# Patient Record
Sex: Male | Born: 1945 | Race: White | Hispanic: No | Smoking: Former smoker
Health system: Southern US, Community
[De-identification: ages and names within clinical notes are randomized; demographics above are authoritative.]

## PROBLEM LIST (undated history)

## (undated) DIAGNOSIS — E785 Hyperlipidemia, unspecified: Secondary | ICD-10-CM

## (undated) DIAGNOSIS — Z789 Other specified health status: Secondary | ICD-10-CM

## (undated) DIAGNOSIS — I2510 Atherosclerotic heart disease of native coronary artery without angina pectoris: Secondary | ICD-10-CM

## (undated) DIAGNOSIS — J439 Emphysema, unspecified: Secondary | ICD-10-CM

## (undated) DIAGNOSIS — J449 Chronic obstructive pulmonary disease, unspecified: Secondary | ICD-10-CM

## (undated) DIAGNOSIS — J939 Pneumothorax, unspecified: Secondary | ICD-10-CM

## (undated) DIAGNOSIS — C61 Malignant neoplasm of prostate: Secondary | ICD-10-CM

## (undated) DIAGNOSIS — I255 Ischemic cardiomyopathy: Secondary | ICD-10-CM

## (undated) DIAGNOSIS — R911 Solitary pulmonary nodule: Secondary | ICD-10-CM

## (undated) HISTORY — DX: Malignant neoplasm of prostate: C61

## (undated) HISTORY — DX: Emphysema, unspecified: J43.9

## (undated) HISTORY — PX: NASAL POLYP SURGERY: SHX186

## (undated) HISTORY — DX: Pneumothorax, unspecified: J93.9

## (undated) HISTORY — DX: Atherosclerotic heart disease of native coronary artery without angina pectoris: I25.10

## (undated) HISTORY — PX: ABDOMINAL HYSTERECTOMY: SHX81

## (undated) HISTORY — DX: Chronic obstructive pulmonary disease, unspecified: J44.9

## (undated) HISTORY — PX: CATARACT EXTRACTION, BILATERAL: SHX1313

## (undated) HISTORY — DX: Solitary pulmonary nodule: R91.1

## (undated) HISTORY — DX: Hyperlipidemia, unspecified: E78.5

## (undated) HISTORY — DX: Ischemic cardiomyopathy: I25.5

## (undated) SURGERY — Surgical Case
Anesthesia: *Unknown

---

## 2017-02-13 ENCOUNTER — Emergency Department: Payer: Medicare Other

## 2017-02-13 ENCOUNTER — Inpatient Hospital Stay
Admission: EM | Admit: 2017-02-13 | Discharge: 2017-02-14 | DRG: 282 | Disposition: A | Discharge status: Other Acute Inpatient Hospital | Payer: Medicare Other | Attending: Internal Medicine | Admitting: Internal Medicine

## 2017-02-13 ENCOUNTER — Encounter: Payer: Self-pay | Admitting: Emergency Medicine

## 2017-02-13 DIAGNOSIS — I2510 Atherosclerotic heart disease of native coronary artery without angina pectoris: Secondary | ICD-10-CM | POA: Diagnosis present

## 2017-02-13 DIAGNOSIS — I214 Non-ST elevation (NSTEMI) myocardial infarction: Principal | ICD-10-CM | POA: Diagnosis present

## 2017-02-13 DIAGNOSIS — Z9841 Cataract extraction status, right eye: Secondary | ICD-10-CM | POA: Diagnosis not present

## 2017-02-13 DIAGNOSIS — Z87891 Personal history of nicotine dependence: Secondary | ICD-10-CM | POA: Diagnosis not present

## 2017-02-13 DIAGNOSIS — Z9842 Cataract extraction status, left eye: Secondary | ICD-10-CM

## 2017-02-13 DIAGNOSIS — I959 Hypotension, unspecified: Secondary | ICD-10-CM | POA: Diagnosis present

## 2017-02-13 DIAGNOSIS — R079 Chest pain, unspecified: Secondary | ICD-10-CM | POA: Diagnosis not present

## 2017-02-13 HISTORY — DX: Other specified health status: Z78.9

## 2017-02-13 LAB — CBC
HEMATOCRIT: 44.3 % (ref 40.0–52.0)
Hemoglobin: 15.2 g/dL (ref 13.0–18.0)
MCH: 31 pg (ref 26.0–34.0)
MCHC: 34.2 g/dL (ref 32.0–36.0)
MCV: 90.6 fL (ref 80.0–100.0)
PLATELETS: 222 10*3/uL (ref 150–440)
RBC: 4.89 MIL/uL (ref 4.40–5.90)
RDW: 13.8 % (ref 11.5–14.5)
WBC: 10.6 10*3/uL (ref 3.8–10.6)

## 2017-02-13 LAB — BASIC METABOLIC PANEL
Anion gap: 10 (ref 5–15)
BUN: 19 mg/dL (ref 6–20)
CHLORIDE: 103 mmol/L (ref 101–111)
CO2: 26 mmol/L (ref 22–32)
Calcium: 9.2 mg/dL (ref 8.9–10.3)
Creatinine, Ser: 1 mg/dL (ref 0.61–1.24)
GFR calc non Af Amer: >60 mL/min (ref >60)
Glucose, Bld: 107 mg/dL — ABNORMAL HIGH (ref 65–99)
POTASSIUM: 3.7 mmol/L (ref 3.5–5.1)
SODIUM: 139 mmol/L (ref 135–145)

## 2017-02-13 LAB — TROPONIN I: Troponin I: >65 ng/mL (ref <0.03)

## 2017-02-13 LAB — PROTIME-INR
INR: 1.11
PROTHROMBIN TIME: 14.3 s (ref 11.4–15.2)

## 2017-02-13 LAB — APTT: aPTT: 30 seconds (ref 24–36)

## 2017-02-13 LAB — BRAIN NATRIURETIC PEPTIDE: B NATRIURETIC PEPTIDE 5: 349 pg/mL — AB (ref 0.0–100.0)

## 2017-02-13 MED ORDER — SODIUM CHLORIDE 0.9 % IV BOLUS (SEPSIS)
1000.0000 mL | Freq: Once | INTRAVENOUS | Status: AC
  Administered 2017-02-13: 1000 mL via INTRAVENOUS

## 2017-02-13 MED ORDER — ASPIRIN 81 MG PO CHEW
324.0000 mg | CHEWABLE_TABLET | Freq: Once | ORAL | Status: AC
  Administered 2017-02-13: 324 mg via ORAL

## 2017-02-13 MED ORDER — ASPIRIN 81 MG PO CHEW
CHEWABLE_TABLET | ORAL | Status: AC
  Administered 2017-02-13: 324 mg via ORAL
  Filled 2017-02-13: qty 4

## 2017-02-13 MED ORDER — NITROGLYCERIN 2 % TD OINT
1.0000 [in_us] | TOPICAL_OINTMENT | Freq: Once | TRANSDERMAL | Status: AC
  Administered 2017-02-13: 1 [in_us] via TOPICAL

## 2017-02-13 MED ORDER — FENTANYL CITRATE (PF) 100 MCG/2ML IJ SOLN
50.0000 ug | Freq: Once | INTRAMUSCULAR | Status: AC
  Administered 2017-02-13: 50 ug via INTRAVENOUS
  Filled 2017-02-13: qty 2

## 2017-02-13 MED ORDER — NITROGLYCERIN 2 % TD OINT
TOPICAL_OINTMENT | TRANSDERMAL | Status: AC
  Administered 2017-02-13: 1 [in_us] via TOPICAL
  Filled 2017-02-13: qty 1

## 2017-02-13 MED ORDER — NITROGLYCERIN IN D5W 200-5 MCG/ML-% IV SOLN
0.0000 ug/min | INTRAVENOUS | Status: DC
  Filled 2017-02-13: qty 250

## 2017-02-13 NOTE — H&P (Signed)
[facility] [place] NAME: Thomas Trujillo    MR#:  [id number]  DATE OF BIRTH:  04-Nov-1945  DATE OF ADMISSION:  02/13/2017  PRIMARY CARE PHYSICIAN: No primary care provider on file.   REQUESTING/REFERRING PHYSICIAN: Burlene Arnt, MD  CHIEF COMPLAINT:   Chief Complaint  Patient presents with  . Chest Pain    HISTORY OF PRESENT ILLNESS:  Thomas Trujillo  is a 71 y.o. male who presents with Chest pain. Patient states he had an acute episode of onset of chest pain associated with diaphoresis and shortness of breath about 24 hours ago. He has had persistent chest pain since that time to a lesser degree. He came in tonight to be evaluated and his initial troponin was greater than 65. Cardiology saw the patient in the ED, and does he is otherwise stable elected to proceed with cardiac catheterization in the morning. Hospitalists were called for admission.  Of note, patient has no other medical conditions on file, states he has not seen a physician for the past 20-30 years.  PAST MEDICAL HISTORY:   Past Medical History:  Diagnosis Date  . Patient denies medical problems     PAST SURGICAL HISTORY:   Past Surgical History:  Procedure Laterality Date  . ABDOMINAL HYSTERECTOMY    . CATARACT EXTRACTION, BILATERAL    . NASAL POLYP SURGERY      SOCIAL HISTORY:   Social History  Substance Use Topics  . Smoking status: Former Research scientist (life sciences)  . Smokeless tobacco: Never Used  . Alcohol use No    FAMILY HISTORY:   Family History  Problem Relation Age of Onset  . Obesity Son     DRUG ALLERGIES:  No Known Allergies  MEDICATIONS AT HOME:   Prior to Admission medications   Not on File    REVIEW OF SYSTEMS:  Review of Systems  Constitutional: Negative for chills, fever, malaise/fatigue and weight loss.  HENT: Negative for ear pain, hearing loss and tinnitus.   Eyes: Negative for blurred vision, double vision, pain and redness.   Respiratory: Negative for cough, hemoptysis and shortness of breath.   Cardiovascular: Positive for chest pain. Negative for palpitations, orthopnea and leg swelling.  Gastrointestinal: Negative for abdominal pain, constipation, diarrhea, nausea and vomiting.  Genitourinary: Negative for dysuria, frequency and hematuria.  Musculoskeletal: Negative for back pain, joint pain and neck pain.  Skin:       No acne, rash, or lesions  Neurological: Negative for dizziness, tremors, focal weakness and weakness.  Endo/Heme/Allergies: Negative for polydipsia. Does not bruise/bleed easily.  Psychiatric/Behavioral: Negative for depression. The patient is not nervous/anxious and does not have insomnia.      VITAL SIGNS:   Vitals:   02/13/17 2209 02/13/17 2214  BP:  128/66  Pulse:  87  Resp:  20  Temp:  98.3 F (36.8 C)  TempSrc:  Oral  SpO2:  97%  Weight: 63.5 kg (140 lb)   Height: 5\' 5"  (1.651 m)    Wt Readings from Last 3 Encounters:  02/13/17 63.5 kg (140 lb)    PHYSICAL EXAMINATION:  Physical Exam  Vitals reviewed. Constitutional: He is oriented to person, place, and time. He appears well-developed and well-nourished. No distress.  HENT:  Head: Normocephalic and atraumatic.  Mouth/Throat: Oropharynx is clear and moist.  Eyes: Conjunctivae and EOM are normal. Pupils are equal, round, and reactive to light. No scleral icterus.  Neck: Normal range of motion. Neck supple. No JVD present. No  thyromegaly present.  Cardiovascular: Normal rate, regular rhythm and intact distal pulses.  Exam reveals no gallop and no friction rub.   No murmur heard. Respiratory: Effort normal and breath sounds normal. No respiratory distress. He has no wheezes. He has no rales.  GI: Soft. Bowel sounds are normal. He exhibits no distension. There is no tenderness.  Musculoskeletal: Normal range of motion. He exhibits no edema.  No arthritis, no gout  Lymphadenopathy:    He has no cervical adenopathy.   Neurological: He is alert and oriented to person, place, and time. No cranial nerve deficit.  No dysarthria, no aphasia  Skin: Skin is warm and dry. No rash noted. No erythema.  Psychiatric: He has a normal mood and affect. His behavior is normal. Judgment and thought content normal.    LABORATORY PANEL:   CBC  Recent Labs Lab 02/13/17 2212  WBC 10.6  HGB 15.2  HCT 44.3  PLT 222   ------------------------------------------------------------------------------------------------------------------  Chemistries   Recent Labs Lab 02/13/17 2212  NA 139  K 3.7  CL 103  CO2 26  GLUCOSE 107*  BUN 19  CREATININE 1.00  CALCIUM 9.2   ------------------------------------------------------------------------------------------------------------------  Cardiac Enzymes  Recent Labs Lab 02/13/17 2212  TROPONINI >65.00*   ------------------------------------------------------------------------------------------------------------------  RADIOLOGY:  Dg Chest Port 1 View  Result Date: 02/13/2017 CLINICAL DATA:  71-year-old male could chest pain. EXAM: PORTABLE CHEST 1 VIEW COMPARISON:  None. FINDINGS: There is emphysematous changes of the lungs with areas of bullous appearing in the upper lobes. There is hyperexpansion of the lungs with bilateral flattening of the diaphragms. Bilateral mid to lower lung field linear densities most consistent with atelectasis/scarring. There is no focal consolidation, pleural effusion, or pneumothorax. The cardiac silhouette is within normal limits. There is atherosclerotic calcification of the aortic arch. No acute osseous pathology. IMPRESSION: 1. No acute cardiopulmonary process. 2. Emphysema. Electronically Signed   By: Anner Crete M.D.   On: 02/13/2017 23:01    EKG:   Orders placed or performed during the hospital encounter of 02/13/17  . ED EKG within 10 minutes  . ED EKG within 10 minutes  . EKG 12-Lead  . EKG 12-Lead    IMPRESSION AND  PLAN:  Principal Problem:   NSTEMI (non-ST elevated myocardial infarction) (HCC) - heparin drip started, chest pain has resolved at this time. Cardiology consulted and will likely proceed with catheterization in the morning. Echocardiogram ordered. We will continue to trend his cardiac enzymes.  All the records are reviewed and case discussed with ED provider. Management plans discussed with the patient and/or family.  DVT PROPHYLAXIS: Systemic anticoagulation  GI PROPHYLAXIS: None  ADMISSION STATUS: Inpatient  CODE STATUS: Full Code Status History    This patient does not have a recorded code status. Please follow your organizational policy for patients in this situation.      TOTAL TIME TAKING CARE OF THIS PATIENT: 45 minutes.   Tracey Stewart Worton 02/13/2017, 11:28 PM  [facility]  Office  [phone]  CC: Primary care physician; No primary care provider on file.  Note:  This document was prepared using Dragon voice recognition software and may include unintentional dictation errors.

## 2017-02-13 NOTE — ED Provider Notes (Addendum)
[facility] Emergency Department Provider Note  ____________________________________________   I have reviewed the triage vital signs and the nursing notes.   HISTORY  Chief Complaint Chest Pain    HPI Thomas Trujillo is a 71 y.o. male who states "I don't go to doctors very much" denies any significant past medical history, states that he does not drink, quit smoking 10 years ago. Retired here from [employer] a few years ago. States that over the last several weeks he had exertional dyspnea. When he walks, he gets so winded he has to stop what he is doing and then last IV and have off-and-on nonradiating gradual onset chest discomfort which she describes as a pressure. Seems a might be worse when he walks around although sometimes it happens at rest. He has not had this before. His been going on for 24 hours. It's a 2 or 3 of 10 at this time. Has been more significant in the past day. Aside from possibly exerting himself nothing makes it better and nothing makes it worse. He denies any cough or fever. He is not having shortness of breath at this time. Did not eat much because he had decreased appetite energy today, he states he did have some diaphoresis. Pain was not tearing, not maximum at intensity in onset, it was gradual, it is not pleuritic, no personal or family history of PE or DVT, no leg swelling.     No past medical history on file.  There are no active problems to display for this patient.   No past surgical history on file.  Prior to Admission medications   Not on File    Allergies Patient has no allergy information on record.  No family history on file.  Social History Social History  Substance Use Topics  . Smoking status: Not on file  . Smokeless tobacco: Not on file  . Alcohol use Not on file    Review of Systems Constitutional: No fever/chills Eyes: No visual changes. ENT: No sore throat. No stiff neck no neck  pain Cardiovascular: Positive chest pain. Respiratory: Positive shortness of breath. Gastrointestinal:   no vomiting.  No diarrhea.  No constipation. Genitourinary: Negative for dysuria. Musculoskeletal: Negative lower extremity swelling Skin: Negative for rash. Neurological: Negative for severe headaches, focal weakness or numbness. 10-point ROS otherwise negative.  ____________________________________________   PHYSICAL EXAM:  VITAL SIGNS: ED Triage Vitals  Enc Vitals Group     BP 02/13/17 2214 128/66     Pulse Rate 02/13/17 2214 87     Resp 02/13/17 2214 20     Temp 02/13/17 2214 98.3 F (36.8 C)     Temp Source 02/13/17 2214 Oral     SpO2 02/13/17 2214 97 %     Weight 02/13/17 2209 140 lb (63.5 kg)     Height 02/13/17 2209 5\' 5"  (1.651 m)     Head Circumference --      Peak Flow --      Pain Score 02/13/17 2208 3     Pain Loc --      Pain Edu? --      Excl. in [place]? --     Constitutional: Alert and oriented. Well appearing and in no acute distress. Eyes: Conjunctivae are normal. PERRL. EOMI. Head: Atraumatic. Nose: No congestion/rhinnorhea. Mouth/Throat: Mucous membranes are moist.  Oropharynx non-erythematous. Neck: No stridor.   Nontender with no meningismus Cardiovascular: Normal rate, regular rhythm. Grossly normal heart sounds.  Good peripheral circulation. Respiratory:  Normal respiratory effort.  No retractions. Lungs CTAB. Abdominal: Soft and nontender. No distention. No guarding no rebound Back:  There is no focal tenderness or step off.  there is no midline tenderness there are no lesions noted. there is no CVA tenderness Musculoskeletal: No lower extremity tenderness, no upper extremity tenderness. No joint effusions, no DVT signs strong distal pulses no edema Neurologic:  Normal speech and language. No gross focal neurologic deficits are appreciated.  Skin:  Skin is warm, dry and intact. No rash noted. Psychiatric: Mood and affect are normal. Speech and  behavior are normal.  ____________________________________________   LABS (all labs ordered are listed, but only abnormal results are displayed)  Labs Reviewed  BASIC METABOLIC PANEL  CBC  TROPONIN I  PROTIME-INR   ____________________________________________  EKG  I personally interpreted any EKGs ordered by me or triage Normal sinus rhythm rate 91 bpm, no acute ST elevation, possible old anterior infarct. Normal axis nonspecific ST changes ____________________________________________  RADIOLOGY  I reviewed any imaging ordered by me or triage that were performed during my shift and, if possible, patient and/or family made aware of any abnormal findings. ____________________________________________   PROCEDURES  Procedure(s) performed: None  Procedures  Critical Care performed: CRITICAL CARE Performed by: Schuyler Amor   Total critical care time: 55 minutes  Critical care time was exclusive of separately billable procedures and treating other patients.  Critical care was necessary to treat or prevent imminent or life-threatening deterioration.  Critical care was time spent personally by me on the following activities: development of treatment plan with patient and/or surrogate as well as nursing, discussions with consultants, evaluation of patient's response to treatment, examination of patient, obtaining history from patient or surrogate, ordering and performing treatments and interventions, ordering and review of laboratory studies, ordering and review of radiographic studies, pulse oximetry and re-evaluation of patient's condition.   ____________________________________________   INITIAL IMPRESSION / ASSESSMENT AND PLAN / ED COURSE  Pertinent labs & imaging results that were available during my care of the patient were reviewed by me and considered in my medical decision making (see chart for details).  Administration with a very concerning story for  possible CAD. At this time, he has minimal discomfort EKG does not meet STEMI requirements. We will give him aspirin, nitroglycerin, vital signs are reassuring. Exam is reassuring. However, patient will need to be admitted I think for this symptom which is concerning with a week or so of ongoing exertional dyspnea With chest pain over the last 24 hours. Low suspicion for PE or dissection. Administration has no pleuritic pain, no leg swelling, and he has no real risk factors for PE. Nor does his pain seemed consistent with dissection. Abdomen is benign. No evidence of referred abdominal discomfort at this time.  ----------------------------------------- 10:36 PM on 02/13/2017 -----------------------------------------  Pain is improved after nitroglycerin, down to 2 out of 10, remains otherwise asymptomatic. Awaiting results of blood work.  ----------------------------------------- 11:21 PM on 02/13/2017 -----------------------------------------  Troponin I noted to be over 65, in his very minimal at this time, we'll give him fentanyl to see if that gets the pain under control, he is at a 1 or 2 out of 10. Likely had cardiac ischemia over the last few days. EKG again does not show any evidence of STEMI. We'll discuss with cardiology, patient is been admitted to the hospitalist  ----------------------------------------- 11:27 PM on 02/13/2017 -----------------------------------------  D/w dr. Clayborn Bigness, he agrees with heparin drip which I have ordered, step down  admission, low-dose beta blockers if  pressure can handle during the admission but not emergently, the patient has ongoing chest pain after fentanyl we will start him on a nitro drip. We are watching his pressure. Again pain trivial at this time. However, considering that he has pain at all given this troponin. EKG reviewed by myself and cardiology, they do not feel patient needs to go to Cath Lab. Patient kept abreast of these findings, Dr.  Jannifer Franklin is admitting.  ----------------------------------------- 11:53 PM on 02/13/2017 -----------------------------------------  Pain free holding ntg gtt, seen by dr Fletcher Anon who does not feel that the pt needs emergent cath    ____________________________________________   FINAL CLINICAL IMPRESSION(S) / ED DIAGNOSES  Final diagnoses:  Chest pain      This chart was dictated using voice recognition software.  Despite best efforts to proofread,  errors can occur which can change meaning.      Schuyler Amor, MD 02/13/17 2222    Schuyler Amor, MD 02/13/17 [id number]    Schuyler Amor, MD 02/13/17 9767    Schuyler Amor, MD 02/13/17 [place], MD 02/13/17 [phone]

## 2017-02-13 NOTE — ED Triage Notes (Addendum)
Pt arrived via ems from home. EMS reports pt's chest pain started last night and became worse when he laid down flat; he took some otc cough medication and felt better until this afternoon when he laid back down. Pt states the pain starts in his left chest and goes across to the right chest and feels like pressure. Upon assessment pt alert and oriented. Pt reports to MD that when walking from car to inside house while carrying groceries he has become very winded and has to sit down "and catch my breath." Pt states he hasn't been to a doctor in an estimated 10 years.

## 2017-02-14 ENCOUNTER — Inpatient Hospital Stay (HOSPITAL_COMMUNITY): Payer: Medicare Other | Admitting: Certified Registered"

## 2017-02-14 ENCOUNTER — Inpatient Hospital Stay (HOSPITAL_COMMUNITY): Payer: Medicare Other

## 2017-02-14 ENCOUNTER — Encounter
Admission: EM | Disposition: A | Discharge status: Other Acute Inpatient Hospital | Payer: Self-pay | Source: Home / Self Care | Attending: Internal Medicine

## 2017-02-14 ENCOUNTER — Encounter (HOSPITAL_COMMUNITY)
Admission: AD | Disposition: A | Discharge status: Skilled Nursing Facility | Payer: Self-pay | Source: Other Acute Inpatient Hospital | Attending: Cardiothoracic Surgery

## 2017-02-14 ENCOUNTER — Encounter (HOSPITAL_COMMUNITY): Payer: Self-pay | Admitting: Certified Registered"

## 2017-02-14 ENCOUNTER — Inpatient Hospital Stay (HOSPITAL_COMMUNITY)
Admission: AD | Admit: 2017-02-14 | Discharge: 2017-02-24 | DRG: 236 | Disposition: A | Discharge status: Skilled Nursing Facility | Payer: Medicare Other | Source: Other Acute Inpatient Hospital | Attending: Cardiothoracic Surgery | Admitting: Cardiothoracic Surgery

## 2017-02-14 ENCOUNTER — Encounter: Payer: Self-pay | Admitting: Internal Medicine

## 2017-02-14 ENCOUNTER — Other Ambulatory Visit: Payer: Self-pay

## 2017-02-14 DIAGNOSIS — Z7982 Long term (current) use of aspirin: Secondary | ICD-10-CM | POA: Diagnosis not present

## 2017-02-14 DIAGNOSIS — Z9841 Cataract extraction status, right eye: Secondary | ICD-10-CM | POA: Diagnosis not present

## 2017-02-14 DIAGNOSIS — Z9842 Cataract extraction status, left eye: Secondary | ICD-10-CM

## 2017-02-14 DIAGNOSIS — Z09 Encounter for follow-up examination after completed treatment for conditions other than malignant neoplasm: Secondary | ICD-10-CM

## 2017-02-14 DIAGNOSIS — J9811 Atelectasis: Secondary | ICD-10-CM | POA: Diagnosis present

## 2017-02-14 DIAGNOSIS — I214 Non-ST elevation (NSTEMI) myocardial infarction: Principal | ICD-10-CM

## 2017-02-14 DIAGNOSIS — R110 Nausea: Secondary | ICD-10-CM | POA: Diagnosis not present

## 2017-02-14 DIAGNOSIS — D62 Acute posthemorrhagic anemia: Secondary | ICD-10-CM | POA: Diagnosis not present

## 2017-02-14 DIAGNOSIS — Z951 Presence of aortocoronary bypass graft: Secondary | ICD-10-CM | POA: Diagnosis not present

## 2017-02-14 DIAGNOSIS — I2510 Atherosclerotic heart disease of native coronary artery without angina pectoris: Secondary | ICD-10-CM | POA: Diagnosis present

## 2017-02-14 DIAGNOSIS — J439 Emphysema, unspecified: Secondary | ICD-10-CM | POA: Diagnosis present

## 2017-02-14 DIAGNOSIS — Z79899 Other long term (current) drug therapy: Secondary | ICD-10-CM

## 2017-02-14 DIAGNOSIS — I2102 ST elevation (STEMI) myocardial infarction involving left anterior descending coronary artery: Secondary | ICD-10-CM | POA: Diagnosis present

## 2017-02-14 DIAGNOSIS — R339 Retention of urine, unspecified: Secondary | ICD-10-CM | POA: Diagnosis not present

## 2017-02-14 DIAGNOSIS — R0600 Dyspnea, unspecified: Secondary | ICD-10-CM

## 2017-02-14 DIAGNOSIS — I959 Hypotension, unspecified: Secondary | ICD-10-CM | POA: Diagnosis not present

## 2017-02-14 DIAGNOSIS — J849 Interstitial pulmonary disease, unspecified: Secondary | ICD-10-CM | POA: Diagnosis present

## 2017-02-14 DIAGNOSIS — Z87891 Personal history of nicotine dependence: Secondary | ICD-10-CM

## 2017-02-14 DIAGNOSIS — E8770 Fluid overload, unspecified: Secondary | ICD-10-CM | POA: Diagnosis not present

## 2017-02-14 DIAGNOSIS — I25110 Atherosclerotic heart disease of native coronary artery with unstable angina pectoris: Secondary | ICD-10-CM

## 2017-02-14 DIAGNOSIS — J90 Pleural effusion, not elsewhere classified: Secondary | ICD-10-CM | POA: Diagnosis present

## 2017-02-14 DIAGNOSIS — R079 Chest pain, unspecified: Secondary | ICD-10-CM | POA: Diagnosis present

## 2017-02-14 HISTORY — PX: LEFT HEART CATH AND CORONARY ANGIOGRAPHY: CATH118249

## 2017-02-14 HISTORY — PX: TEE WITHOUT CARDIOVERSION: SHX5443

## 2017-02-14 HISTORY — PX: ENDOVEIN HARVEST OF GREATER SAPHENOUS VEIN: SHX5059

## 2017-02-14 HISTORY — PX: STAPLING OF BLEBS: SHX6429

## 2017-02-14 HISTORY — DX: Atherosclerotic heart disease of native coronary artery without angina pectoris: I25.10

## 2017-02-14 HISTORY — PX: CORONARY ARTERY BYPASS GRAFT: SHX141

## 2017-02-14 LAB — POCT I-STAT 4, (NA,K, GLUC, HGB,HCT)
Glucose, Bld: 111 mg/dL — ABNORMAL HIGH (ref 65–99)
HCT: 27 % — ABNORMAL LOW (ref 39.0–52.0)
Hemoglobin: 9.2 g/dL — ABNORMAL LOW (ref 13.0–17.0)
Potassium: 3.7 mmol/L (ref 3.5–5.1)
Sodium: 139 mmol/L (ref 135–145)

## 2017-02-14 LAB — POCT I-STAT 3, ART BLOOD GAS (G3+)
Acid-Base Excess: 2 mmol/L (ref 0.0–2.0)
Acid-base deficit: 3 mmol/L — ABNORMAL HIGH (ref 0.0–2.0)
Bicarbonate: 26.8 mmol/L (ref 20.0–28.0)
Bicarbonate: 22.4 mmol/L (ref 20.0–28.0)
O2 Saturation: 100 %
O2 Saturation: 99 %
Patient temperature: 35.9
TCO2: 28 mmol/L (ref 0–100)
TCO2: 24 mmol/L (ref 0–100)
pCO2 arterial: 39.7 mmHg (ref 32.0–48.0)
pCO2 arterial: 38.2 mmHg (ref 32.0–48.0)
pH, Arterial: 7.438 (ref 7.350–7.450)
pH, Arterial: 7.372 (ref 7.350–7.450)
pO2, Arterial: 423 mmHg — ABNORMAL HIGH (ref 83.0–108.0)
pO2, Arterial: 116 mmHg — ABNORMAL HIGH (ref 83.0–108.0)

## 2017-02-14 LAB — LIPID PANEL
CHOL/HDL RATIO: 2.9 ratio
Cholesterol: 114 mg/dL (ref 0–200)
HDL: 39 mg/dL — ABNORMAL LOW (ref >40)
LDL Cholesterol: 65 mg/dL (ref 0–99)
Triglycerides: 51 mg/dL (ref <150)
VLDL: 10 mg/dL (ref 0–40)

## 2017-02-14 LAB — HEPARIN LEVEL (UNFRACTIONATED): HEPARIN UNFRACTIONATED: 0.32 [IU]/mL (ref 0.30–0.70)

## 2017-02-14 LAB — POCT I-STAT, CHEM 8
BUN: 18 mg/dL (ref 6–20)
BUN: 13 mg/dL (ref 6–20)
BUN: 17 mg/dL (ref 6–20)
BUN: 14 mg/dL (ref 6–20)
Calcium, Ion: 1.18 mmol/L (ref 1.15–1.40)
Calcium, Ion: 0.87 mmol/L — CL (ref 1.15–1.40)
Calcium, Ion: 1.17 mmol/L (ref 1.15–1.40)
Calcium, Ion: 1.14 mmol/L — ABNORMAL LOW (ref 1.15–1.40)
Chloride: 103 mmol/L (ref 101–111)
Chloride: 104 mmol/L (ref 101–111)
Chloride: 98 mmol/L — ABNORMAL LOW (ref 101–111)
Chloride: 102 mmol/L (ref 101–111)
Creatinine, Ser: 0.8 mg/dL (ref 0.61–1.24)
Creatinine, Ser: 0.4 mg/dL — ABNORMAL LOW (ref 0.61–1.24)
Creatinine, Ser: 0.7 mg/dL (ref 0.61–1.24)
Creatinine, Ser: 0.7 mg/dL (ref 0.61–1.24)
Glucose, Bld: 91 mg/dL (ref 65–99)
Glucose, Bld: 80 mg/dL (ref 65–99)
Glucose, Bld: 97 mg/dL (ref 65–99)
Glucose, Bld: 102 mg/dL — ABNORMAL HIGH (ref 65–99)
HCT: 32 % — ABNORMAL LOW (ref 39.0–52.0)
HCT: 25 % — ABNORMAL LOW (ref 39.0–52.0)
HCT: 31 % — ABNORMAL LOW (ref 39.0–52.0)
HCT: 24 % — ABNORMAL LOW (ref 39.0–52.0)
Hemoglobin: 10.9 g/dL — ABNORMAL LOW (ref 13.0–17.0)
Hemoglobin: 10.5 g/dL — ABNORMAL LOW (ref 13.0–17.0)
Hemoglobin: 8.5 g/dL — ABNORMAL LOW (ref 13.0–17.0)
Hemoglobin: 8.2 g/dL — ABNORMAL LOW (ref 13.0–17.0)
Potassium: 4.3 mmol/L (ref 3.5–5.1)
Potassium: 3.9 mmol/L (ref 3.5–5.1)
Potassium: 3.9 mmol/L (ref 3.5–5.1)
Potassium: 4.1 mmol/L (ref 3.5–5.1)
Sodium: 137 mmol/L (ref 135–145)
Sodium: 138 mmol/L (ref 135–145)
Sodium: 139 mmol/L (ref 135–145)
Sodium: 137 mmol/L (ref 135–145)
TCO2: 30 mmol/L (ref 0–100)
TCO2: 28 mmol/L (ref 0–100)
TCO2: 29 mmol/L (ref 0–100)
TCO2: 27 mmol/L (ref 0–100)

## 2017-02-14 LAB — CBC
HCT: 28.1 % — ABNORMAL LOW (ref 39.0–52.0)
HEMATOCRIT: 38.8 % — AB (ref 40.0–52.0)
HEMOGLOBIN: 13.4 g/dL (ref 13.0–18.0)
HEMOGLOBIN: 9.6 g/dL — AB (ref 13.0–17.0)
MCH: 31.1 pg (ref 26.0–34.0)
MCH: 31.4 pg (ref 26.0–34.0)
MCHC: 34.4 g/dL (ref 32.0–36.0)
MCHC: 34.2 g/dL (ref 30.0–36.0)
MCV: 90.4 fL (ref 80.0–100.0)
MCV: 91.8 fL (ref 78.0–100.0)
Platelets: 198 10*3/uL (ref 150–440)
Platelets: 117 10*3/uL — ABNORMAL LOW (ref 150–400)
RBC: 4.3 MIL/uL — ABNORMAL LOW (ref 4.40–5.90)
RBC: 3.06 MIL/uL — AB (ref 4.22–5.81)
RDW: 13.6 % (ref 11.5–14.5)
RDW: 13.7 % (ref 11.5–15.5)
WBC: 7.6 10*3/uL (ref 3.8–10.6)
WBC: 10.8 10*3/uL — ABNORMAL HIGH (ref 4.0–10.5)

## 2017-02-14 LAB — TROPONIN I: Troponin I: 50.17 ng/mL (ref <0.03)

## 2017-02-14 LAB — MRSA PCR SCREENING: MRSA by PCR: NEGATIVE

## 2017-02-14 LAB — HEMOGLOBIN AND HEMATOCRIT, BLOOD
HCT: 25.5 % — ABNORMAL LOW (ref 39.0–52.0)
Hemoglobin: 8.5 g/dL — ABNORMAL LOW (ref 13.0–17.0)

## 2017-02-14 LAB — GLUCOSE, CAPILLARY
Glucose-Capillary: 120 mg/dL — ABNORMAL HIGH (ref 65–99)
Glucose-Capillary: 120 mg/dL — ABNORMAL HIGH (ref 65–99)
Glucose-Capillary: 135 mg/dL — ABNORMAL HIGH (ref 65–99)
Glucose-Capillary: 85 mg/dL (ref 65–99)

## 2017-02-14 LAB — BASIC METABOLIC PANEL
Anion gap: 7 (ref 5–15)
BUN: 19 mg/dL (ref 6–20)
CHLORIDE: 103 mmol/L (ref 101–111)
CO2: 28 mmol/L (ref 22–32)
Calcium: 8.5 mg/dL — ABNORMAL LOW (ref 8.9–10.3)
Creatinine, Ser: 0.99 mg/dL (ref 0.61–1.24)
GFR calc Af Amer: >60 mL/min (ref >60)
GFR calc non Af Amer: >60 mL/min (ref >60)
Glucose, Bld: 97 mg/dL (ref 65–99)
POTASSIUM: 3.9 mmol/L (ref 3.5–5.1)
Sodium: 138 mmol/L (ref 135–145)

## 2017-02-14 LAB — PROTIME-INR
INR: 1.62
PROTHROMBIN TIME: 19.5 s — AB (ref 11.4–15.2)

## 2017-02-14 LAB — PREPARE RBC (CROSSMATCH)

## 2017-02-14 LAB — TSH: TSH: 1.836 u[IU]/mL (ref 0.350–4.500)

## 2017-02-14 LAB — APTT: APTT: 33 s (ref 24–36)

## 2017-02-14 LAB — ABO/RH: ABO/RH(D): B POS

## 2017-02-14 LAB — PLATELET COUNT: Platelets: 124 10*3/uL — ABNORMAL LOW (ref 150–400)

## 2017-02-14 SURGERY — LEFT HEART CATH AND CORONARY ANGIOGRAPHY
Anesthesia: Moderate Sedation

## 2017-02-14 SURGERY — CORONARY ARTERY BYPASS GRAFTING (CABG)
Anesthesia: General | Site: Leg Upper | Laterality: Right

## 2017-02-14 MED ORDER — MIDAZOLAM HCL 2 MG/2ML IJ SOLN
INTRAMUSCULAR | Status: AC
  Filled 2017-02-14: qty 2

## 2017-02-14 MED ORDER — HEPARIN SODIUM (PORCINE) 1000 UNIT/ML IJ SOLN
INTRAMUSCULAR | Status: DC | PRN
  Administered 2017-02-14: 24000 [IU] via INTRAVENOUS

## 2017-02-14 MED ORDER — ASPIRIN 81 MG PO CHEW
CHEWABLE_TABLET | ORAL | Status: AC
  Administered 2017-02-14: 81 mg via ORAL
  Filled 2017-02-14: qty 1

## 2017-02-14 MED ORDER — SODIUM CHLORIDE 0.9% FLUSH
3.0000 mL | Freq: Two times a day (BID) | INTRAVENOUS | Status: DC
  Administered 2017-02-15 – 2017-02-17 (×4): 3 mL via INTRAVENOUS

## 2017-02-14 MED ORDER — MORPHINE SULFATE (PF) 2 MG/ML IV SOLN
1.0000 mg | INTRAVENOUS | Status: AC | PRN
  Administered 2017-02-15 (×2): 2 mg via INTRAVENOUS

## 2017-02-14 MED ORDER — NITROGLYCERIN IN D5W 200-5 MCG/ML-% IV SOLN: 0.0000 ug/min | INTRAVENOUS | Status: DC

## 2017-02-14 MED ORDER — MIDAZOLAM HCL 2 MG/2ML IJ SOLN
INTRAMUSCULAR | Status: DC | PRN
  Administered 2017-02-14: 0.5 mg via INTRAVENOUS

## 2017-02-14 MED ORDER — ATORVASTATIN CALCIUM 80 MG PO TABS: 80.0000 mg | ORAL_TABLET | Freq: Every day | ORAL | 0 refills | Status: DC

## 2017-02-14 MED ORDER — CALCIUM CHLORIDE 10 % IV SOLN
INTRAVENOUS | Status: DC | PRN
  Administered 2017-02-14: 200 mg via INTRAVENOUS

## 2017-02-14 MED ORDER — TRANEXAMIC ACID 1000 MG/10ML IV SOLN: 1.5000 mg/kg/h | INTRAVENOUS | Status: DC

## 2017-02-14 MED ORDER — FENTANYL CITRATE (PF) 250 MCG/5ML IJ SOLN
INTRAMUSCULAR | Status: AC
  Filled 2017-02-14: qty 20

## 2017-02-14 MED ORDER — FENTANYL CITRATE (PF) 250 MCG/5ML IJ SOLN
INTRAMUSCULAR | Status: AC
  Filled 2017-02-14: qty 5

## 2017-02-14 MED ORDER — CHLORHEXIDINE GLUCONATE 0.12% ORAL RINSE (MEDLINE KIT)
15.0000 mL | Freq: Two times a day (BID) | OROMUCOSAL | Status: DC
  Administered 2017-02-14: 15 mL via OROMUCOSAL

## 2017-02-14 MED ORDER — ACETAMINOPHEN 325 MG PO TABS: 650.0000 mg | ORAL_TABLET | Freq: Four times a day (QID) | ORAL | Status: DC | PRN

## 2017-02-14 MED ORDER — SODIUM CHLORIDE 0.9 % IV SOLN
INTRAVENOUS | Status: AC
  Administered 2017-02-14: 1.5 [IU]/h via INTRAVENOUS
  Filled 2017-02-14: qty 2.5

## 2017-02-14 MED ORDER — TRAMADOL HCL 50 MG PO TABS
50.0000 mg | ORAL_TABLET | ORAL | Status: DC | PRN
  Administered 2017-02-16: 100 mg via ORAL
  Filled 2017-02-14: qty 2

## 2017-02-14 MED ORDER — SODIUM CHLORIDE 0.9 % IV SOLN: 250.0000 mL | INTRAVENOUS | Status: DC | PRN

## 2017-02-14 MED ORDER — HEPARIN (PORCINE) IN NACL 2-0.9 UNIT/ML-% IJ SOLN
INTRAMUSCULAR | Status: AC
  Filled 2017-02-14: qty 500

## 2017-02-14 MED ORDER — LIDOCAINE 2% (20 MG/ML) 5 ML SYRINGE
INTRAMUSCULAR | Status: DC | PRN
  Administered 2017-02-14: 100 mg via INTRAVENOUS

## 2017-02-14 MED ORDER — SODIUM CHLORIDE 0.9% FLUSH: 3.0000 mL | Freq: Two times a day (BID) | INTRAVENOUS | Status: DC

## 2017-02-14 MED ORDER — ALBUMIN HUMAN 5 % IV SOLN
INTRAVENOUS | Status: DC | PRN
  Administered 2017-02-14 (×2): via INTRAVENOUS

## 2017-02-14 MED ORDER — DIPHENHYDRAMINE HCL 50 MG/ML IJ SOLN
INTRAMUSCULAR | Status: AC
  Filled 2017-02-14: qty 1

## 2017-02-14 MED ORDER — SODIUM CHLORIDE 0.9 % IV SOLN
0.0000 ug/min | INTRAVENOUS | Status: DC
  Administered 2017-02-14: 25 ug/min via INTRAVENOUS
  Administered 2017-02-15: 30 ug/min via INTRAVENOUS
  Filled 2017-02-14 (×2): qty 2

## 2017-02-14 MED ORDER — ACETAMINOPHEN 500 MG PO TABS
1000.0000 mg | ORAL_TABLET | Freq: Four times a day (QID) | ORAL | Status: DC
  Administered 2017-02-15 – 2017-02-17 (×9): 1000 mg via ORAL
  Filled 2017-02-14 (×9): qty 2

## 2017-02-14 MED ORDER — FENTANYL CITRATE (PF) 100 MCG/2ML IJ SOLN
INTRAMUSCULAR | Status: AC
  Filled 2017-02-14: qty 2

## 2017-02-14 MED ORDER — HEMOSTATIC AGENTS (NO CHARGE) OPTIME
TOPICAL | Status: DC | PRN
Start: 2017-02-14 — End: 2017-02-14
  Administered 2017-02-14 (×2): 1 via TOPICAL

## 2017-02-14 MED ORDER — SODIUM CHLORIDE 0.9 % WEIGHT BASED INFUSION: 1.0000 mL/kg/h | INTRAVENOUS | Status: DC

## 2017-02-14 MED ORDER — OXYCODONE HCL 5 MG PO TABS
5.0000 mg | ORAL_TABLET | ORAL | Status: DC | PRN
  Administered 2017-02-15 – 2017-02-16 (×2): 5 mg via ORAL
  Filled 2017-02-14 (×2): qty 1

## 2017-02-14 MED ORDER — ALBUMIN HUMAN 5 % IV SOLN
250.0000 mL | INTRAVENOUS | Status: AC | PRN
  Administered 2017-02-14 (×4): 250 mL via INTRAVENOUS
  Filled 2017-02-14 (×2): qty 250

## 2017-02-14 MED ORDER — VERAPAMIL HCL 2.5 MG/ML IV SOLN
INTRAVENOUS | Status: DC | PRN
  Administered 2017-02-14: 2.5 mg via INTRA_ARTERIAL

## 2017-02-14 MED ORDER — HEPARIN SODIUM (PORCINE) 1000 UNIT/ML IJ SOLN
INTRAMUSCULAR | Status: AC
  Filled 2017-02-14: qty 1

## 2017-02-14 MED ORDER — DEXTROSE 5 % IV SOLN: 750.0000 mg | INTRAVENOUS | Status: DC

## 2017-02-14 MED ORDER — CALCIUM CHLORIDE 10 % IV SOLN
INTRAVENOUS | Status: AC
  Filled 2017-02-14: qty 10

## 2017-02-14 MED ORDER — SODIUM CHLORIDE 0.9 % IV SOLN
0.0000 ug/kg/h | INTRAVENOUS | Status: DC
  Filled 2017-02-14: qty 2

## 2017-02-14 MED ORDER — ASPIRIN EC 81 MG PO TBEC
81.0000 mg | DELAYED_RELEASE_TABLET | Freq: Every day | ORAL | Status: DC
Start: 2017-02-14 — End: 2017-02-14

## 2017-02-14 MED ORDER — METOPROLOL TARTRATE 25 MG/10 ML ORAL SUSPENSION: 12.5000 mg | Freq: Two times a day (BID) | ORAL | Status: DC

## 2017-02-14 MED ORDER — DEXTROSE 5 % IV SOLN
1.5000 g | INTRAVENOUS | Status: AC
  Administered 2017-02-14: .75 g via INTRAVENOUS
  Administered 2017-02-14: 1.5 g via INTRAVENOUS
  Filled 2017-02-14: qty 1.5

## 2017-02-14 MED ORDER — PHENYLEPHRINE HCL 10 MG/ML IJ SOLN
INTRAVENOUS | Status: DC | PRN
  Administered 2017-02-14: 35 ug/min via INTRAVENOUS

## 2017-02-14 MED ORDER — LACTATED RINGERS IV SOLN: INTRAVENOUS | Status: DC

## 2017-02-14 MED ORDER — HEPARIN (PORCINE) IN NACL 100-0.45 UNIT/ML-% IJ SOLN
800.0000 [IU]/h | INTRAMUSCULAR | Status: DC
  Administered 2017-02-14: 800 [IU]/h via INTRAVENOUS
  Filled 2017-02-14: qty 250

## 2017-02-14 MED ORDER — DOPAMINE-DEXTROSE 3.2-5 MG/ML-% IV SOLN
0.0000 ug/kg/min | INTRAVENOUS | Status: DC
  Filled 2017-02-14: qty 250

## 2017-02-14 MED ORDER — DOCUSATE SODIUM 100 MG PO CAPS
200.0000 mg | ORAL_CAPSULE | Freq: Every day | ORAL | Status: DC
  Administered 2017-02-15 – 2017-02-17 (×3): 200 mg via ORAL
  Filled 2017-02-14 (×3): qty 2

## 2017-02-14 MED ORDER — SODIUM CHLORIDE 0.9 % IV SOLN: INTRAVENOUS | Status: DC

## 2017-02-14 MED ORDER — PROTAMINE SULFATE 10 MG/ML IV SOLN
INTRAVENOUS | Status: AC
  Filled 2017-02-14: qty 25

## 2017-02-14 MED ORDER — TRANEXAMIC ACID (OHS) BOLUS VIA INFUSION
15.0000 mg/kg | INTRAVENOUS | Status: AC
  Administered 2017-02-14: 952.5 mg via INTRAVENOUS
  Filled 2017-02-14: qty 953

## 2017-02-14 MED ORDER — VANCOMYCIN HCL 10 G IV SOLR: 1250.0000 mg | INTRAVENOUS | Status: DC

## 2017-02-14 MED ORDER — ORAL CARE MOUTH RINSE
15.0000 mL | Freq: Four times a day (QID) | OROMUCOSAL | Status: DC
  Administered 2017-02-14 – 2017-02-16 (×4): 15 mL via OROMUCOSAL

## 2017-02-14 MED ORDER — MAGNESIUM SULFATE 50 % IJ SOLN
40.0000 meq | INTRAMUSCULAR | Status: DC
  Filled 2017-02-14: qty 10

## 2017-02-14 MED ORDER — ACETAMINOPHEN 160 MG/5ML PO SOLN
1000.0000 mg | Freq: Four times a day (QID) | ORAL | Status: DC
  Administered 2017-02-14: 1000 mg
  Filled 2017-02-14: qty 40.6

## 2017-02-14 MED ORDER — SODIUM CHLORIDE 0.9 % IV SOLN
30.0000 meq | Freq: Once | INTRAVENOUS | Status: AC
  Administered 2017-02-14: 30 meq via INTRAVENOUS
  Filled 2017-02-14: qty 15

## 2017-02-14 MED ORDER — SODIUM CHLORIDE 0.45 % IV SOLN: INTRAVENOUS | Status: DC | PRN

## 2017-02-14 MED ORDER — ATORVASTATIN CALCIUM 80 MG PO TABS
80.0000 mg | ORAL_TABLET | Freq: Every day | ORAL | Status: DC
  Administered 2017-02-15: 80 mg via ORAL
  Filled 2017-02-14: qty 1

## 2017-02-14 MED ORDER — MAGNESIUM SULFATE 50 % IJ SOLN: 40.0000 meq | INTRAMUSCULAR | Status: DC

## 2017-02-14 MED ORDER — BISACODYL 10 MG RE SUPP: 10.0000 mg | Freq: Every day | RECTAL | Status: DC

## 2017-02-14 MED ORDER — HEPARIN BOLUS VIA INFUSION
3800.0000 [IU] | Freq: Once | INTRAVENOUS | Status: AC
  Administered 2017-02-14: 3800 [IU] via INTRAVENOUS
  Filled 2017-02-14: qty 3800

## 2017-02-14 MED ORDER — TRANEXAMIC ACID (OHS) PUMP PRIME SOLUTION
2.0000 mg/kg | INTRAVENOUS | Status: DC
  Filled 2017-02-14: qty 1.27

## 2017-02-14 MED ORDER — HEPARIN SODIUM (PORCINE) 1000 UNIT/ML IJ SOLN
INTRAMUSCULAR | Status: DC | PRN
  Administered 2017-02-14: 3000 [IU] via INTRAVENOUS

## 2017-02-14 MED ORDER — HYDROCORTISONE NA SUCCINATE PF 100 MG IJ SOLR
INTRAMUSCULAR | Status: DC | PRN
  Administered 2017-02-14: 125 mg via INTRAVENOUS

## 2017-02-14 MED ORDER — PROPOFOL 10 MG/ML IV BOLUS
INTRAVENOUS | Status: AC
  Filled 2017-02-14: qty 20

## 2017-02-14 MED ORDER — ASPIRIN 81 MG PO TBEC: 81.0000 mg | DELAYED_RELEASE_TABLET | Freq: Every day | ORAL | 0 refills | Status: DC

## 2017-02-14 MED ORDER — SODIUM CHLORIDE 0.9 % WEIGHT BASED INFUSION
3.0000 mL/kg/h | INTRAVENOUS | Status: DC
Start: 2017-02-15 — End: 2017-02-14
  Administered 2017-02-14: 3 mL/kg/h via INTRAVENOUS

## 2017-02-14 MED ORDER — SODIUM CHLORIDE 0.9 % IV SOLN: 30.0000 ug/min | INTRAVENOUS | Status: DC

## 2017-02-14 MED ORDER — BISACODYL 5 MG PO TBEC
10.0000 mg | DELAYED_RELEASE_TABLET | Freq: Every day | ORAL | Status: DC
  Administered 2017-02-15 – 2017-02-17 (×3): 10 mg via ORAL
  Filled 2017-02-14 (×3): qty 2

## 2017-02-14 MED ORDER — ASPIRIN 81 MG PO CHEW
324.0000 mg | CHEWABLE_TABLET | Freq: Every day | ORAL | Status: DC
  Administered 2017-02-15: 324 mg

## 2017-02-14 MED ORDER — VANCOMYCIN HCL IN DEXTROSE 1-5 GM/200ML-% IV SOLN
1000.0000 mg | Freq: Once | INTRAVENOUS | Status: AC
Start: 2017-02-15 — End: 2017-02-15
  Administered 2017-02-15: 1000 mg via INTRAVENOUS
  Filled 2017-02-14: qty 200

## 2017-02-14 MED ORDER — SODIUM CHLORIDE 0.9% FLUSH: 3.0000 mL | INTRAVENOUS | Status: DC | PRN

## 2017-02-14 MED ORDER — NITROGLYCERIN IN D5W 200-5 MCG/ML-% IV SOLN: 2.0000 ug/min | INTRAVENOUS | Status: DC

## 2017-02-14 MED ORDER — SUCCINYLCHOLINE CHLORIDE 200 MG/10ML IV SOSY
PREFILLED_SYRINGE | INTRAVENOUS | Status: AC
  Filled 2017-02-14: qty 10

## 2017-02-14 MED ORDER — METOPROLOL TARTRATE 25 MG PO TABS: 12.5000 mg | ORAL_TABLET | Freq: Two times a day (BID) | ORAL | Status: DC

## 2017-02-14 MED ORDER — LIDOCAINE 2% (20 MG/ML) 5 ML SYRINGE
INTRAMUSCULAR | Status: AC
  Filled 2017-02-14: qty 5

## 2017-02-14 MED ORDER — EPINEPHRINE PF 1 MG/ML IJ SOLN
0.0000 ug/min | INTRAVENOUS | Status: DC
  Filled 2017-02-14: qty 4

## 2017-02-14 MED ORDER — ASPIRIN EC 325 MG PO TBEC
325.0000 mg | DELAYED_RELEASE_TABLET | Freq: Every day | ORAL | Status: DC
  Administered 2017-02-16 – 2017-02-17 (×2): 325 mg via ORAL
  Filled 2017-02-14 (×3): qty 1

## 2017-02-14 MED ORDER — LEVALBUTEROL HCL 0.63 MG/3ML IN NEBU: 0.6300 mg | INHALATION_SOLUTION | Freq: Four times a day (QID) | RESPIRATORY_TRACT | Status: DC | PRN

## 2017-02-14 MED ORDER — DIPHENHYDRAMINE HCL 50 MG/ML IJ SOLN
INTRAMUSCULAR | Status: DC | PRN
  Administered 2017-02-14: 25 mg via INTRAVENOUS

## 2017-02-14 MED ORDER — METOPROLOL TARTRATE 25 MG PO TABS: 12.5000 mg | ORAL_TABLET | Freq: Two times a day (BID) | ORAL | 0 refills | Status: DC

## 2017-02-14 MED ORDER — ONDANSETRON HCL 4 MG/2ML IJ SOLN
4.0000 mg | Freq: Four times a day (QID) | INTRAMUSCULAR | Status: DC | PRN
  Administered 2017-02-15 – 2017-02-17 (×4): 4 mg via INTRAVENOUS
  Filled 2017-02-14 (×5): qty 2

## 2017-02-14 MED ORDER — SODIUM CHLORIDE 0.9 % IV SOLN
INTRAVENOUS | Status: DC
  Filled 2017-02-14: qty 30

## 2017-02-14 MED ORDER — LIDOCAINE HCL (PF) 1 % IJ SOLN
INTRAMUSCULAR | Status: AC
  Filled 2017-02-14: qty 30

## 2017-02-14 MED ORDER — DEXTROSE 5 % IV SOLN: 1.5000 g | INTRAVENOUS | Status: DC

## 2017-02-14 MED ORDER — METOPROLOL TARTRATE 12.5 MG HALF TABLET
12.5000 mg | ORAL_TABLET | Freq: Two times a day (BID) | ORAL | Status: DC
  Administered 2017-02-16 – 2017-02-17 (×2): 12.5 mg via ORAL
  Filled 2017-02-14 (×4): qty 1

## 2017-02-14 MED ORDER — ATORVASTATIN CALCIUM 80 MG PO TABS: 80.0000 mg | ORAL_TABLET | Freq: Every day | ORAL | Status: DC

## 2017-02-14 MED ORDER — EPINEPHRINE PF 1 MG/ML IJ SOLN: 0.0000 ug/min | INTRAVENOUS | Status: DC

## 2017-02-14 MED ORDER — CHLORHEXIDINE GLUCONATE 0.12 % MT SOLN
15.0000 mL | OROMUCOSAL | Status: AC
  Administered 2017-02-14: 15 mL via OROMUCOSAL

## 2017-02-14 MED ORDER — SODIUM CHLORIDE 0.9 % IJ SOLN
OROMUCOSAL | Status: DC | PRN
  Administered 2017-02-14: 12:00:00 via TOPICAL

## 2017-02-14 MED ORDER — LACTATED RINGERS IV SOLN: 500.0000 mL | Freq: Once | INTRAVENOUS | Status: DC | PRN

## 2017-02-14 MED ORDER — VERAPAMIL HCL 2.5 MG/ML IV SOLN
INTRAVENOUS | Status: AC
  Filled 2017-02-14: qty 2

## 2017-02-14 MED ORDER — MIDAZOLAM HCL 10 MG/2ML IJ SOLN
INTRAMUSCULAR | Status: AC
  Filled 2017-02-14: qty 2

## 2017-02-14 MED ORDER — ACETAMINOPHEN 650 MG RE SUPP
650.0000 mg | Freq: Once | RECTAL | Status: AC
  Administered 2017-02-14: 650 mg via RECTAL

## 2017-02-14 MED ORDER — SODIUM CHLORIDE 0.9 % IV SOLN
1.5000 mg/kg/h | INTRAVENOUS | Status: AC
  Administered 2017-02-14: 1.5 mg/kg/h via INTRAVENOUS
  Filled 2017-02-14: qty 25

## 2017-02-14 MED ORDER — LIDOCAINE HCL (PF) 1 % IJ SOLN
INTRAMUSCULAR | Status: DC | PRN
  Administered 2017-02-14: 1 mL

## 2017-02-14 MED ORDER — POTASSIUM CHLORIDE 2 MEQ/ML IV SOLN: 80.0000 meq | INTRAVENOUS | Status: DC

## 2017-02-14 MED ORDER — IOPAMIDOL (ISOVUE-300) INJECTION 61%
INTRAVENOUS | Status: DC | PRN
  Administered 2017-02-14: 45 mL via INTRA_ARTERIAL

## 2017-02-14 MED ORDER — POTASSIUM CHLORIDE 2 MEQ/ML IV SOLN
80.0000 meq | INTRAVENOUS | Status: DC
  Filled 2017-02-14: qty 40

## 2017-02-14 MED ORDER — EPHEDRINE 5 MG/ML INJ
INTRAVENOUS | Status: AC
  Filled 2017-02-14: qty 20

## 2017-02-14 MED ORDER — 0.9 % SODIUM CHLORIDE (POUR BTL) OPTIME
TOPICAL | Status: DC | PRN
  Administered 2017-02-14: 6000 mL

## 2017-02-14 MED ORDER — LACTATED RINGERS IV SOLN
INTRAVENOUS | Status: DC | PRN
  Administered 2017-02-14 (×2): via INTRAVENOUS

## 2017-02-14 MED ORDER — PLASMA-LYTE 148 IV SOLN: INTRAVENOUS | Status: DC

## 2017-02-14 MED ORDER — SODIUM CHLORIDE 0.9 % IV SOLN
INTRAVENOUS | Status: DC
  Filled 2017-02-14: qty 2.5

## 2017-02-14 MED ORDER — DEXTROSE 5 % IV SOLN
1.5000 g | Freq: Two times a day (BID) | INTRAVENOUS | Status: AC
  Administered 2017-02-15 – 2017-02-16 (×4): 1.5 g via INTRAVENOUS
  Filled 2017-02-14 (×4): qty 1.5

## 2017-02-14 MED ORDER — DEXTROSE 5 % IV SOLN
750.0000 mg | INTRAVENOUS | Status: DC
  Filled 2017-02-14: qty 750

## 2017-02-14 MED ORDER — PROPOFOL 10 MG/ML IV BOLUS
INTRAVENOUS | Status: DC | PRN
  Administered 2017-02-14: 80 mg via INTRAVENOUS

## 2017-02-14 MED ORDER — MIDAZOLAM HCL 5 MG/5ML IJ SOLN
INTRAMUSCULAR | Status: DC | PRN
  Administered 2017-02-14: 1 mg via INTRAVENOUS
  Administered 2017-02-14: 3 mg via INTRAVENOUS
  Administered 2017-02-14: 5 mg via INTRAVENOUS
  Administered 2017-02-14: 1 mg via INTRAVENOUS

## 2017-02-14 MED ORDER — ROCURONIUM BROMIDE 10 MG/ML (PF) SYRINGE
PREFILLED_SYRINGE | INTRAVENOUS | Status: AC
  Filled 2017-02-14: qty 5

## 2017-02-14 MED ORDER — ONDANSETRON HCL 4 MG PO TABS: 4.0000 mg | ORAL_TABLET | Freq: Four times a day (QID) | ORAL | Status: DC | PRN

## 2017-02-14 MED ORDER — ACETAMINOPHEN 650 MG RE SUPP: 650.0000 mg | Freq: Four times a day (QID) | RECTAL | Status: DC | PRN

## 2017-02-14 MED ORDER — LEVALBUTEROL HCL 0.63 MG/3ML IN NEBU
0.6300 mg | INHALATION_SOLUTION | Freq: Four times a day (QID) | RESPIRATORY_TRACT | Status: DC
  Administered 2017-02-14 – 2017-02-17 (×8): 0.63 mg via RESPIRATORY_TRACT
  Filled 2017-02-14 (×8): qty 3

## 2017-02-14 MED ORDER — NITROGLYCERIN IN D5W 200-5 MCG/ML-% IV SOLN
2.0000 ug/min | INTRAVENOUS | Status: AC
  Administered 2017-02-14: 5 ug/min via INTRAVENOUS
  Filled 2017-02-14: qty 250

## 2017-02-14 MED ORDER — PANTOPRAZOLE SODIUM 40 MG PO TBEC
40.0000 mg | DELAYED_RELEASE_TABLET | Freq: Every day | ORAL | Status: DC
  Administered 2017-02-16 – 2017-02-17 (×2): 40 mg via ORAL
  Filled 2017-02-14 (×2): qty 1

## 2017-02-14 MED ORDER — ASPIRIN 81 MG PO CHEW
81.0000 mg | CHEWABLE_TABLET | ORAL | Status: AC
  Administered 2017-02-14: 81 mg via ORAL

## 2017-02-14 MED ORDER — SUCCINYLCHOLINE CHLORIDE 20 MG/ML IJ SOLN
INTRAMUSCULAR | Status: DC | PRN
  Administered 2017-02-14: 120 mg via INTRAVENOUS

## 2017-02-14 MED ORDER — SODIUM CHLORIDE 0.9 % IV SOLN
250.0000 mL | INTRAVENOUS | Status: DC
Start: 2017-02-15 — End: 2017-02-17

## 2017-02-14 MED ORDER — ROCURONIUM BROMIDE 10 MG/ML (PF) SYRINGE
PREFILLED_SYRINGE | INTRAVENOUS | Status: DC | PRN
  Administered 2017-02-14: 100 mg via INTRAVENOUS
  Administered 2017-02-14 (×2): 50 mg via INTRAVENOUS

## 2017-02-14 MED ORDER — PHENYLEPHRINE 40 MCG/ML (10ML) SYRINGE FOR IV PUSH (FOR BLOOD PRESSURE SUPPORT)
PREFILLED_SYRINGE | INTRAVENOUS | Status: AC
  Filled 2017-02-14: qty 20

## 2017-02-14 MED ORDER — EPINEPHRINE PF 1 MG/10ML IJ SOSY
PREFILLED_SYRINGE | INTRAMUSCULAR | Status: AC
  Filled 2017-02-14: qty 10

## 2017-02-14 MED ORDER — ACETAMINOPHEN 160 MG/5ML PO SOLN: 650.0000 mg | Freq: Once | ORAL | Status: AC

## 2017-02-14 MED ORDER — MIDAZOLAM HCL 2 MG/2ML IJ SOLN: 2.0000 mg | INTRAMUSCULAR | Status: DC | PRN

## 2017-02-14 MED ORDER — SODIUM CHLORIDE 0.9 % IV SOLN
30.0000 ug/min | INTRAVENOUS | Status: DC
  Filled 2017-02-14: qty 2

## 2017-02-14 MED ORDER — TRANEXAMIC ACID (OHS) PUMP PRIME SOLUTION: 2.0000 mg/kg | INTRAVENOUS | Status: DC

## 2017-02-14 MED ORDER — VANCOMYCIN HCL 10 G IV SOLR
1250.0000 mg | INTRAVENOUS | Status: AC
  Administered 2017-02-14: 1250 mg via INTRAVENOUS
  Filled 2017-02-14: qty 1250

## 2017-02-14 MED ORDER — MAGNESIUM SULFATE 4 GM/100ML IV SOLN
4.0000 g | Freq: Once | INTRAVENOUS | Status: AC
  Administered 2017-02-14: 4 g via INTRAVENOUS
  Filled 2017-02-14: qty 100

## 2017-02-14 MED ORDER — PROTAMINE SULFATE 10 MG/ML IV SOLN
INTRAVENOUS | Status: DC | PRN
  Administered 2017-02-14: 120 mg via INTRAVENOUS
  Administered 2017-02-14: 110 mg via INTRAVENOUS
  Administered 2017-02-14: 10 mg via INTRAVENOUS

## 2017-02-14 MED ORDER — MORPHINE SULFATE (PF) 2 MG/ML IV SOLN
2.0000 mg | INTRAVENOUS | Status: DC | PRN
  Filled 2017-02-14 (×3): qty 1

## 2017-02-14 MED ORDER — DOPAMINE-DEXTROSE 3.2-5 MG/ML-% IV SOLN: 0.0000 ug/kg/min | INTRAVENOUS | Status: DC

## 2017-02-14 MED ORDER — SODIUM CHLORIDE 0.9 % IV SOLN: INTRAVENOUS | Status: AC

## 2017-02-14 MED ORDER — INSULIN ASPART 100 UNIT/ML ~~LOC~~ SOLN
0.0000 [IU] | SUBCUTANEOUS | Status: DC
  Administered 2017-02-14 – 2017-02-15 (×5): 2 [IU] via SUBCUTANEOUS

## 2017-02-14 MED ORDER — ONDANSETRON HCL 4 MG/2ML IJ SOLN: 4.0000 mg | Freq: Four times a day (QID) | INTRAMUSCULAR | Status: DC | PRN

## 2017-02-14 MED ORDER — INSULIN REGULAR BOLUS VIA INFUSION
0.0000 [IU] | Freq: Three times a day (TID) | INTRAVENOUS | Status: DC
  Filled 2017-02-14: qty 10

## 2017-02-14 MED ORDER — PLASMA-LYTE 148 IV SOLN
INTRAVENOUS | Status: AC
  Administered 2017-02-14: 500 mL
  Filled 2017-02-14: qty 2.5

## 2017-02-14 MED ORDER — FENTANYL CITRATE (PF) 100 MCG/2ML IJ SOLN
INTRAMUSCULAR | Status: DC | PRN
  Administered 2017-02-14: 25 ug via INTRAVENOUS

## 2017-02-14 MED ORDER — DEXMEDETOMIDINE HCL IN NACL 400 MCG/100ML IV SOLN
0.1000 ug/kg/h | INTRAVENOUS | Status: AC
  Administered 2017-02-14: .2 ug/kg/h via INTRAVENOUS
  Filled 2017-02-14: qty 100

## 2017-02-14 MED ORDER — TRANEXAMIC ACID (OHS) BOLUS VIA INFUSION: 15.0000 mg/kg | INTRAVENOUS | Status: DC

## 2017-02-14 MED ORDER — FAMOTIDINE IN NACL 20-0.9 MG/50ML-% IV SOLN
20.0000 mg | Freq: Two times a day (BID) | INTRAVENOUS | Status: AC
  Administered 2017-02-14 – 2017-02-15 (×2): 20 mg via INTRAVENOUS
  Filled 2017-02-14 (×2): qty 50

## 2017-02-14 MED ORDER — FENTANYL CITRATE (PF) 250 MCG/5ML IJ SOLN
INTRAMUSCULAR | Status: DC | PRN
  Administered 2017-02-14: 100 ug via INTRAVENOUS
  Administered 2017-02-14: 150 ug via INTRAVENOUS
  Administered 2017-02-14: 50 ug via INTRAVENOUS
  Administered 2017-02-14: 100 ug via INTRAVENOUS
  Administered 2017-02-14: 200 ug via INTRAVENOUS
  Administered 2017-02-14: 150 ug via INTRAVENOUS
  Administered 2017-02-14 (×2): 100 ug via INTRAVENOUS
  Administered 2017-02-14: 250 ug via INTRAVENOUS
  Administered 2017-02-14: 100 ug via INTRAVENOUS
  Administered 2017-02-14: 150 ug via INTRAVENOUS
  Administered 2017-02-14: 50 ug via INTRAVENOUS

## 2017-02-14 MED ORDER — METOPROLOL TARTRATE 5 MG/5ML IV SOLN: 2.5000 mg | INTRAVENOUS | Status: DC | PRN

## 2017-02-14 MED ORDER — DEXMEDETOMIDINE HCL IN NACL 400 MCG/100ML IV SOLN: 0.1000 ug/kg/h | INTRAVENOUS | Status: DC

## 2017-02-14 SURGICAL SUPPLY — 76 items
BAG DECANTER FOR FLEXI CONT (MISCELLANEOUS) ×4 IMPLANT
BANDAGE ACE 4X5 VEL STRL LF (GAUZE/BANDAGES/DRESSINGS) ×4 IMPLANT
BANDAGE ACE 6X5 VEL STRL LF (GAUZE/BANDAGES/DRESSINGS) ×4 IMPLANT
BLADE STERNUM SYSTEM 6 (BLADE) ×4 IMPLANT
BLADE SURG 11 STRL SS (BLADE) ×4 IMPLANT
BNDG GAUZE ELAST 4 BULKY (GAUZE/BANDAGES/DRESSINGS) ×4 IMPLANT
CANISTER SUCT 3000ML PPV (MISCELLANEOUS) ×4 IMPLANT
CATH CPB KIT GERHARDT (MISCELLANEOUS) ×4 IMPLANT
CATH THORACIC 28FR (CATHETERS) ×4 IMPLANT
CONT SPEC 4OZ CLIKSEAL STRL BL (MISCELLANEOUS) ×4 IMPLANT
CRADLE DONUT ADULT HEAD (MISCELLANEOUS) ×4 IMPLANT
DRAIN CHANNEL 28F RND 3/8 FF (WOUND CARE) ×4 IMPLANT
DRAPE CARDIOVASCULAR INCISE (DRAPES) ×1
DRAPE SLUSH/WARMER DISC (DRAPES) ×4 IMPLANT
DRAPE SRG 135X102X78XABS (DRAPES) ×3 IMPLANT
DRSG AQUACEL AG ADV 3.5X14 (GAUZE/BANDAGES/DRESSINGS) ×4 IMPLANT
ELECT BLADE 4.0 EZ CLEAN MEGAD (MISCELLANEOUS) ×4
ELECT REM PT RETURN 9FT ADLT (ELECTROSURGICAL) ×8
ELECTRODE BLDE 4.0 EZ CLN MEGD (MISCELLANEOUS) ×3 IMPLANT
ELECTRODE REM PT RTRN 9FT ADLT (ELECTROSURGICAL) ×6 IMPLANT
FELT TEFLON 1X6 (MISCELLANEOUS) ×4 IMPLANT
GAUZE SPONGE 4X4 12PLY STRL (GAUZE/BANDAGES/DRESSINGS) ×8 IMPLANT
GLOVE BIO SURGEON STRL SZ 6 (GLOVE) ×8 IMPLANT
GLOVE BIO SURGEON STRL SZ 6.5 (GLOVE) ×12 IMPLANT
GLOVE BIOGEL M 6.5 STRL (GLOVE) ×4 IMPLANT
GLOVE BIOGEL M 7.0 STRL (GLOVE) ×8 IMPLANT
GLOVE BIOGEL PI IND STRL 6.5 (GLOVE) ×3 IMPLANT
GLOVE BIOGEL PI IND STRL 7.0 (GLOVE) ×12 IMPLANT
GLOVE BIOGEL PI INDICATOR 6.5 (GLOVE) ×1
GLOVE BIOGEL PI INDICATOR 7.0 (GLOVE) ×4
GOWN STRL REUS W/ TWL LRG LVL3 (GOWN DISPOSABLE) ×30 IMPLANT
GOWN STRL REUS W/TWL LRG LVL3 (GOWN DISPOSABLE) ×10
HEMOSTAT POWDER SURGIFOAM 1G (HEMOSTASIS) ×12 IMPLANT
HEMOSTAT SURGICEL 2X14 (HEMOSTASIS) ×4 IMPLANT
KIT BASIN OR (CUSTOM PROCEDURE TRAY) ×4 IMPLANT
KIT CATH SUCT 8FR (CATHETERS) ×4 IMPLANT
KIT ROOM TURNOVER OR (KITS) ×4 IMPLANT
KIT SUCTION CATH 14FR (SUCTIONS) ×8 IMPLANT
KIT VASOVIEW HEMOPRO VH 3000 (KITS) ×4 IMPLANT
LEAD PACING MYOCARDI (MISCELLANEOUS) ×4 IMPLANT
MARKER GRAFT CORONARY BYPASS (MISCELLANEOUS) ×12 IMPLANT
NS IRRIG 1000ML POUR BTL (IV SOLUTION) ×24 IMPLANT
PACK OPEN HEART (CUSTOM PROCEDURE TRAY) ×4 IMPLANT
PAD ARMBOARD 7.5X6 YLW CONV (MISCELLANEOUS) ×8 IMPLANT
PAD ELECT DEFIB RADIOL ZOLL (MISCELLANEOUS) ×4 IMPLANT
PENCIL BUTTON HOLSTER BLD 10FT (ELECTRODE) ×4 IMPLANT
RELOAD STAPLER GOLD 60MM (STAPLE) ×3 IMPLANT
STAPLE ECHEON FLEX 60 POW ENDO (STAPLE) ×4 IMPLANT
STAPLER RELOAD GOLD 60MM (STAPLE) ×4
SURGIFLO W/THROMBIN 8M KIT (HEMOSTASIS) ×4 IMPLANT
SUT BONE WAX W31G (SUTURE) ×4 IMPLANT
SUT MNCRL AB 4-0 PS2 18 (SUTURE) ×4 IMPLANT
SUT PROLENE 3 0 SH1 36 (SUTURE) ×8 IMPLANT
SUT PROLENE 4 0 TF (SUTURE) ×8 IMPLANT
SUT PROLENE 6 0 C 1 30 (SUTURE) ×8 IMPLANT
SUT PROLENE 6 0 CC (SUTURE) ×8 IMPLANT
SUT PROLENE 7 0 BV1 MDA (SUTURE) ×4 IMPLANT
SUT PROLENE 7.0 RB 3 (SUTURE) ×4 IMPLANT
SUT PROLENE 8 0 BV175 6 (SUTURE) ×4 IMPLANT
SUT STEEL 6MS V (SUTURE) ×4 IMPLANT
SUT STEEL SZ 6 DBL 3X14 BALL (SUTURE) ×4 IMPLANT
SUT VIC AB 1 CTX 18 (SUTURE) ×8 IMPLANT
SUT VIC AB 2-0 CT1 27 (SUTURE) ×1
SUT VIC AB 2-0 CT1 TAPERPNT 27 (SUTURE) ×3 IMPLANT
SUTURE E-PAK OPEN HEART (SUTURE) ×4 IMPLANT
SYSTEM SAHARA CHEST DRAIN ATS (WOUND CARE) ×4 IMPLANT
TAPE CLOTH SURG 4X10 WHT LF (GAUZE/BANDAGES/DRESSINGS) ×4 IMPLANT
TAPE PAPER 2X10 WHT MICROPORE (GAUZE/BANDAGES/DRESSINGS) ×4 IMPLANT
TOWEL GREEN STERILE (TOWEL DISPOSABLE) ×4 IMPLANT
TOWEL GREEN STERILE FF (TOWEL DISPOSABLE) IMPLANT
TOWEL OR 17X24 6PK STRL BLUE (TOWEL DISPOSABLE) IMPLANT
TOWEL OR 17X26 10 PK STRL BLUE (TOWEL DISPOSABLE) IMPLANT
TRAY FOLEY SILVER 16FR TEMP (SET/KITS/TRAYS/PACK) ×4 IMPLANT
TUBING INSUFFLATION (TUBING) ×4 IMPLANT
UNDERPAD 30X30 (UNDERPADS AND DIAPERS) ×4 IMPLANT
WATER STERILE IRR 1000ML POUR (IV SOLUTION) ×8 IMPLANT

## 2017-02-14 SURGICAL SUPPLY — 8 items
CATH 5FR JR4 DIAGNOSTIC (CATHETERS) ×3 IMPLANT
CATH INFINITI 5 FR JL3.5 (CATHETERS) ×3 IMPLANT
CATH INFINITI 5FR ANG PIGTAIL (CATHETERS) ×3 IMPLANT
DEVICE RAD TR BAND REGULAR (VASCULAR PRODUCTS) ×3 IMPLANT
GLIDESHEATH SLEND SS 6F .021 (SHEATH) ×3 IMPLANT
KIT MANI 3VAL PERCEP (MISCELLANEOUS) ×3 IMPLANT
PACK CARDIAC CATH (CUSTOM PROCEDURE TRAY) ×3 IMPLANT
WIRE ROSEN-J .035X260CM (WIRE) ×3 IMPLANT

## 2017-02-14 NOTE — Progress Notes (Signed)
Patient failed initial wean due to ABG results. Will try again later.

## 2017-02-14 NOTE — Anesthesia Procedure Notes (Signed)
Procedure Name: Intubation Date/Time: 02/14/2017 1:09 PM Performed by: Gaylene Brooks Pre-anesthesia Checklist: Patient identified, Emergency Drugs available, Suction available and Patient being monitored Patient Re-evaluated:Patient Re-evaluated prior to inductionOxygen Delivery Method: Circle System Utilized Preoxygenation: Pre-oxygenation with 100% oxygen Intubation Type: IV induction Ventilation: Mask ventilation without difficulty Laryngoscope Size: Miller and 2 Grade View: Grade II Tube type: Oral Tube size: 8.0 mm Number of attempts: 1 Airway Equipment and Method: Stylet and Oral airway Placement Confirmation: ETT inserted through vocal cords under direct vision,  positive ETCO2 and breath sounds checked- equal and bilateral Secured at: 22 cm Tube secured with: Tape Dental Injury: Teeth and Oropharynx as per pre-operative assessment

## 2017-02-14 NOTE — Consult Note (Signed)
Name: Thomas Trujillo MRN: [id number] DOB: Sep 26, 1946    ADMISSION DATE:  02/13/2017 CONSULTATION DATE:  02/13/2017  REFERRING MD :  Dr. Jannifer Franklin for chest pain.   CHIEF COMPLAINT:  Chest Pain   BRIEF PATIENT DESCRIPTION:  71 yo male admitted 04/23 with NSTEMI with chest pain and exertional dyspnea   SIGNIFICANT EVENTS  04/23-Pt admitted to [facility] with NSTEMI  STUDIES:  None   HISTORY OF PRESENT ILLNESS:   This is a 71 yo male with PMH of Former Smoker (1 PPD quit date 2014).  He presented to [facility] ER 04/23 with non radiating chest pain/pressure at rest, diaphoresis, and exertional dyspnea onset the evening of 04/23.  He has never had this pain before.  In the ER initial troponin was >65, therefore Cardiology assessed the pt and determined the pt was stable and did not need an urgent cardiac catheterization, plans for cardiac catheterization today 04/24.  Therefore, pt admitted to the [facility] by hospitalist for further management and treatment.  PAST MEDICAL HISTORY :   has a past medical history of Patient denies medical problems.  has a past surgical history that includes Abdominal hysterectomy; Nasal polyp surgery; and Cataract extraction, bilateral. Prior to Admission medications   Not on File   No Known Allergies  FAMILY HISTORY:  family history is not on file. SOCIAL HISTORY:  reports that he has quit smoking. He has never used smokeless tobacco. He reports that he does not drink alcohol or use drugs.  REVIEW OF SYSTEMS:  Positives in BOLD Constitutional: fever, chills, weight loss, malaise/fatigue and diaphoresis.  HENT: Negative for hearing loss, ear pain, nosebleeds, congestion, sore throat, neck pain, tinnitus and ear discharge.   Eyes: Negative for blurred vision, double vision, photophobia, pain, discharge and redness.  Respiratory: cough, hemoptysis, sputum production, shortness of breath, wheezing and stridor.   Cardiovascular: chest pain,  palpitations, orthopnea, claudication, leg swelling and PND.  Gastrointestinal: Negative for heartburn, nausea, vomiting, abdominal pain, diarrhea, constipation, blood in stool and melena.  Genitourinary: Negative for dysuria, urgency, frequency, hematuria and flank pain.  Musculoskeletal: Negative for myalgias, back pain, joint pain and falls.  Skin: Negative for itching and rash.  Neurological: Negative for dizziness, tingling, tremors, sensory change, speech change, focal weakness, seizures, loss of consciousness, weakness and headaches.  Endo/Heme/Allergies: Negative for environmental allergies and polydipsia. Does not bruise/bleed easily.  SUBJECTIVE:  No complaints at this time states chest pain is currently 1/10.  VITAL SIGNS: Temp:  [98.3 F (36.8 C)] 98.3 F (36.8 C) (04/23 2214) Pulse Rate:  [73-87] 85 (04/23 2345) Resp:  [15-20] 15 (04/23 2345) BP: (112-128)/(63-68) 125/68 (04/23 2345) SpO2:  [92 %-97 %] 92 % (04/23 2345) Weight:  [63.5 kg (140 lb)] 63.5 kg (140 lb) (04/23 2209)  PHYSICAL EXAMINATION: General: well developed, well nourished Caucasian male, NAD Neuro:  alert and oriented, follows commands HEENT: supple, no JVD Cardiovascular: nsr, s1s2, no M/R/G Lungs: clear throughout, even, non labored  Abdomen:  +BS x4, soft, non tender, non distended Musculoskeletal: normal bulk and tone, no edema Skin: no rashes or lesions    Recent Labs Lab 02/13/17 2212  NA 139  K 3.7  CL 103  CO2 26  BUN 19  CREATININE 1.00  GLUCOSE 107*    Recent Labs Lab 02/13/17 2212  HGB 15.2  HCT 44.3  WBC 10.6  PLT 222   Dg Chest Port 1 View  Result Date: 02/13/2017 CLINICAL DATA:  34-year-old male could chest pain. EXAM:  PORTABLE CHEST 1 VIEW COMPARISON:  None. FINDINGS: There is emphysematous changes of the lungs with areas of bullous appearing in the upper lobes. There is hyperexpansion of the lungs with bilateral flattening of the diaphragms. Bilateral mid to lower lung  field linear densities most consistent with atelectasis/scarring. There is no focal consolidation, pleural effusion, or pneumothorax. The cardiac silhouette is within normal limits. There is atherosclerotic calcification of the aortic arch. No acute osseous pathology. IMPRESSION: 1. No acute cardiopulmonary process. 2. Emphysema. Electronically Signed   By: Anner Crete M.D.   On: 02/13/2017 23:01    ASSESSMENT / PLAN: NSTEMI with elevated troponins.  Chest Pain  Emphysema with hyperinflation seen on CXR.  P: Supplemental O2 to maintain O2 sats >92% Trend troponin's Continuous telemetry monitoring Echo pending Cardiology consulted appreciate input-plans for Cardiac Catheterization 04/24 Heparin gtt dosing per pharmacy  Nitroglycerin gtt as needed for chest pain  Continue po aspirin   Marda Stalker, [place] Pager [phone] (please enter 7 digits) PCCM Consult Pager [phone] (please enter 7 digits)  Patient seen and examined with NP, above notereflect my findings,ssssmt, plan. The patient is awa and alert. He notes that his  ches pain is 1out of 10. It is nonradiating. He has no other particular complaints today. Review of labs  Shows elevated troponins. Personl review of hs EKG shows no evidce of ST elvation, my personal review of his imaging on CXR; there is hyperinflation consistent with emphysema, though he has minimal symptoms at baseline. Will continue heparin and NTG infusion, cardiology following with possible heart cath today.   -Dr. Ashby Dawes, M.D. 02/14/2017

## 2017-02-14 NOTE — Progress Notes (Addendum)
A&O x 4, VSS, hep gtt infusing at 800 u/hr, chest pain 1 out of 10 on 0-10 scale, reported to [facility] in [place], patient transported by Roselyn Reef to Cath Lab 2 2 liters O2 / minute, RN does not need to accompany per Dr Fritzi Mandes

## 2017-02-14 NOTE — Brief Op Note (Addendum)
      [address]       [place] [address]             [phone]      02/14/2017  3:55 PM  PATIENT:  Thomas Trujillo  71 y.o. 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   PROCEDURE:  Emergency CORONARY ARTERY BYPASS GRAFTING (CABG) x 2 , using left internal mammary artery and right leg greater saphenous vein harvested endoscopically LIMA-LAD, SVG-RAMUS (N/A) TRANSESOPHAGEAL ECHOCARDIOGRAM (TEE) (N/A) STAPLING OF LARGE PULMOARY BLEB left upper lobe N/A)  SURGEON:  Surgeon(s) and Role:    * [name], MD - Primary  PHYSICIAN ASSISTANT:  Nicholes Rough, PA-C     ANESTHESIA:   general  EBL:  Total I/O In: 1800 [I.V.:1800] Out: 600 [Urine:600]  BLOOD ADMINISTERED:none  DRAINS: routine   LOCAL MEDICATIONS USED:  NONE  SPECIMEN:  Source of Specimen:  left upper lung bleb  DISPOSITION OF SPECIMEN:  PATHOLOGY  COUNTS:  YES   DICTATION: .Dragon Dictation  PLAN OF CARE: Admit to inpatient   PATIENT DISPOSITION:  ICU - intubated and hemodynamically stable.   Delay start of Pharmacological VTE agent (>24hrs) due to surgical blood loss or risk of bleeding: yes

## 2017-02-14 NOTE — Consult Note (Signed)
Cardiology Consultation Note    Patient ID: Thomas Trujillo, MRN: [id number], DOB/AGE: 1946-06-05 71 y.o. Admit date: 02/13/2017   Date of Consult: 02/14/2017 Primary Physician: [name], MD Primary Cardiologist: None  Chief Complaint: Chest pain Reason for Consultation: NSTEMI Requesting MD: Lance Coon, MD  HPI: Thomas Trujillo is a 71 y.o. male who is being seen today for the evaluation of chest pain and elevated troponin at the request of Dr. Jannifer Franklin. Thomas Trujillo has no significant past medical history but has not received routine medical care for several decades. He reports acute onset of chest pain overnight from Sunday into Monday (24-36 hours ago) with accompanying shortness of breath, diaphoresis, and lightheadedness. The pain was 8/10 at its maximal intensity and is described as non-radiating pressure across the upper chest. He took Alka-Seltzer cold medication yesterday morning with some improvement in the pain. As it persisted, he proceeded to the [facility] ED last night, where he was noted to have a troponin I > 65. EKG did not show ST elevation, and the patient was started on heparin and nitroglycerin infusions for NSTEMI.  At this time, Thomas Trujillo has 1/10 chest discomfort. He otherwise feels well. He denies a history of heart disease, as well as previous cardiac workup. His case was discussed with Drs. Arida and [place] night; decision was made to defer emergent catheterization and admit to stepdown for medical management and possible left heart catheterization today.   Past Medical History:  Diagnosis Date  . Patient denies medical problems       Surgical History:  Past Surgical History:  Procedure Laterality Date  . ABDOMINAL HYSTERECTOMY    . CATARACT EXTRACTION, BILATERAL    . NASAL POLYP SURGERY       Home Meds: Prior to Admission medications   Not on File    Inpatient Medications:  . sodium chloride flush  3 mL Intravenous Q12H   . sodium  chloride    . heparin 800 Units/hr (02/14/17 0055)  . nitroGLYCERIN Stopped (02/13/17 2344)    Allergies: No Known Allergies  Social History   Social History  . Marital status: Divorced    Spouse name: N/A  . Number of children: N/A  . Years of education: N/A   Occupational History  . Not on file.   Social History Main Topics  . Smoking status: Former Research scientist (life sciences)  . Smokeless tobacco: Never Used  . Alcohol use No  . Drug use: No  . Sexual activity: Not on file   Other Topics Concern  . Not on file   Social History Narrative  . No narrative on file     Family History  Problem Relation Age of Onset  . Obesity Son      Review of Systems: A 12-system review of systems was performed and is negative except as noted in the HPI.  Labs:  Recent Labs  02/13/17 2212 02/14/17 0138  TROPONINI >65.00* >65.00*   Lab Results  Component Value Date   WBC 10.6 02/13/2017   HGB 15.2 02/13/2017   HCT 44.3 02/13/2017   MCV 90.6 02/13/2017   PLT 222 02/13/2017    Recent Labs Lab 02/13/17 2212  NA 139  K 3.7  CL 103  CO2 26  BUN 19  CREATININE 1.00  CALCIUM 9.2  GLUCOSE 107*   No results found for: CHOL, HDL, LDLCALC, TRIG No results found for: DDIMER  Radiology/Studies:  Dg Chest Port 1 View  Result Date: 02/13/2017 CLINICAL DATA:  57-year-old male could chest pain. EXAM: PORTABLE CHEST 1 VIEW COMPARISON:  None. FINDINGS: There is emphysematous changes of the lungs with areas of bullous appearing in the upper lobes. There is hyperexpansion of the lungs with bilateral flattening of the diaphragms. Bilateral mid to lower lung field linear densities most consistent with atelectasis/scarring. There is no focal consolidation, pleural effusion, or pneumothorax. The cardiac silhouette is within normal limits. There is atherosclerotic calcification of the aortic arch. No acute osseous pathology. IMPRESSION: 1. No acute cardiopulmonary process. 2. Emphysema. Electronically Signed    By: Anner Crete M.D.   On: 02/13/2017 23:01    Wt Readings from Last 3 Encounters:  02/14/17 63.5 kg (140 lb)    EKG: NSR, low voltage, poor R-wave progression, and non-specific ST segment changes.  Physical Exam: Blood pressure 125/68, pulse 61, temperature 98.7 F (37.1 C), temperature source Oral, resp. rate 16, height 5\' 5"  (1.651 m), weight 63.5 kg (140 lb), SpO2 97 %. Body mass index is 23.3 kg/m. General: Well developed, well nourished, in no acute distress. Head: Normocephalic, atraumatic, sclera non-icteric, no xanthomas, nares are without discharge.  Neck: Negative for carotid bruits. JVD not elevated. Lungs: Clear bilaterally to auscultation without wheezes, rales, or rhonchi. Breathing is unlabored. Heart: Distant heart sounds. RRR with S1 S2. No murmurs, rubs, or gallops appreciated. Abdomen: Soft, non-tender, non-distended with normoactive bowel sounds. No hepatomegaly. No rebound/guarding. No obvious abdominal masses. Msk:  Strength and tone appear normal for age. Extremities: No clubbing or cyanosis. No edema.  Radial and pedal pulses are 2+ bilaterally. Neuro: Alert and oriented X 3. No facial asymmetry. No focal deficit. Moves all extremities spontaneously. Psych:  Responds to questions appropriately with a normal affect.    Assessment and Plan  71 y/o man with no significant past medical history (but also poor medical follow-up), admitted with chest pain for the last 24-36 hours and significant troponin elevation, consistent with NSTEMI.  NSTEMI Patient continues to have mild chest pain on heparin and NTG gtt. 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 We have discussed medical therapy versus left heart catheterization with possible PCI and have agreed to the later.  Plan for LHC with possible PCI this morning. I have reviewed the risks, indications, and alternatives to cardiac catheterization, possible angioplasty, and stenting with the  patient. Risks include but are not limited to bleeding, infection, vascular injury, stroke, myocardial infection, arrhythmia, kidney injury, radiation-related injury in the case of prolonged fluoroscopy use, emergency cardiac surgery, and death. The patient understands the risks of serious complication is 1-2 in 8756 with diagnostic cardiac cath and 1-2% or less with angioplasty/stenting.  Continue heparin infusion and ASA 81 mg daily; defer adding P2Y12 inhibitor pending catheterization in case urgent cardiac surgery is needed.  Titrate NTG infusion for relief of chest pain, as BP allows.  Proceed with transthoracic echocardiogram.  Initiate high-intensity statin and low-dose beta-blocker.  Check lipid panel, hemoglobin A1c, and TSH  Signed, [name] MD 02/14/2017, 7:44 AM Pager: [phone]

## 2017-02-14 NOTE — Transfer of Care (Signed)
Immediate Anesthesia Transfer of Care Note  Patient: Thomas Trujillo  Procedure(s) Performed: Procedure(s): CORONARY ARTERY BYPASS GRAFTING (CABG) x 2 , using left internal mammary artery and right leg greater saphenous vein harvested endoscopically LIMA-LAD, SVG-RAMUS (N/A) TRANSESOPHAGEAL ECHOCARDIOGRAM (TEE) (N/A) STAPLING OF LARGE LEFT UPPER LOBE PULMONARY BLEB (N/A) ENDOVEIN HARVEST OF RIGHT THIGH GREATER SAPHENOUS VEIN (Right)  Patient Location: SICU  Anesthesia Type:General  Level of Consciousness: sedated and Patient remains intubated per anesthesia plan  Airway & Oxygen Therapy: Patient remains intubated per anesthesia plan and Patient placed on Ventilator (see vital sign flow sheet for setting)  Post-op Assessment: Report given to RN and Post -op Vital signs reviewed and stable  Post vital signs: Reviewed and stable  Last Vitals:  Vitals:   02/14/17 1845 02/14/17 1900  BP:  124/73  Pulse: 99 98  Resp: 12 12  Temp: (!) 35.9 C (!) 35.8 C    Last Pain:  Vitals:   02/14/17 1810  TempSrc: Core (Comment)         Complications: No apparent anesthesia complications

## 2017-02-14 NOTE — Progress Notes (Signed)
  Echocardiogram Echocardiogram Transesophageal has been performed.  Darlina Sicilian M 02/14/2017, 2:59 PM

## 2017-02-14 NOTE — H&P (View-Only) (Signed)
Cardiology Consultation Note    Patient ID: Thomas Trujillo, MRN: [id number], DOB/AGE: 1946/09/28 71 y.o. Admit date: 02/13/2017   Date of Consult: 02/14/2017 Primary Physician: [name], MD Primary Cardiologist: None  Chief Complaint: Chest pain Reason for Consultation: NSTEMI Requesting MD: Lance Coon, MD  HPI: Thomas Trujillo is a 71 y.o. male who is being seen today for the evaluation of chest pain and elevated troponin at the request of Dr. Jannifer Franklin. Thomas Trujillo has no significant past medical history but has not received routine medical care for several decades. He reports acute onset of chest pain overnight from Sunday into Monday (24-36 hours ago) with accompanying shortness of breath, diaphoresis, and lightheadedness. The pain was 8/10 at its maximal intensity and is described as non-radiating pressure across the upper chest. He took Alka-Seltzer cold medication yesterday morning with some improvement in the pain. As it persisted, he proceeded to the [facility] ED last night, where he was noted to have a troponin I > 65. EKG did not show ST elevation, and the patient was started on heparin and nitroglycerin infusions for NSTEMI.  At this time, Thomas Trujillo has 1/10 chest discomfort. He otherwise feels well. He denies a history of heart disease, as well as previous cardiac workup. His case was discussed with Drs. Arida and [place] night; decision was made to defer emergent catheterization and admit to stepdown for medical management and possible left heart catheterization today.   Past Medical History:  Diagnosis Date  . Patient denies medical problems       Surgical History:  Past Surgical History:  Procedure Laterality Date  . ABDOMINAL HYSTERECTOMY    . CATARACT EXTRACTION, BILATERAL    . NASAL POLYP SURGERY       Home Meds: Prior to Admission medications   Not on File    Inpatient Medications:  . sodium chloride flush  3 mL Intravenous Q12H   . sodium  chloride    . heparin 800 Units/hr (02/14/17 0055)  . nitroGLYCERIN Stopped (02/13/17 2344)    Allergies: No Known Allergies  Social History   Social History  . Marital status: Divorced    Spouse name: N/A  . Number of children: N/A  . Years of education: N/A   Occupational History  . Not on file.   Social History Main Topics  . Smoking status: Former Research scientist (life sciences)  . Smokeless tobacco: Never Used  . Alcohol use No  . Drug use: No  . Sexual activity: Not on file   Other Topics Concern  . Not on file   Social History Narrative  . No narrative on file     Family History  Problem Relation Age of Onset  . Obesity Son      Review of Systems: A 12-system review of systems was performed and is negative except as noted in the HPI.  Labs:  Recent Labs  02/13/17 2212 02/14/17 0138  TROPONINI >65.00* >65.00*   Lab Results  Component Value Date   WBC 10.6 02/13/2017   HGB 15.2 02/13/2017   HCT 44.3 02/13/2017   MCV 90.6 02/13/2017   PLT 222 02/13/2017    Recent Labs Lab 02/13/17 2212  NA 139  K 3.7  CL 103  CO2 26  BUN 19  CREATININE 1.00  CALCIUM 9.2  GLUCOSE 107*   No results found for: CHOL, HDL, LDLCALC, TRIG No results found for: DDIMER  Radiology/Studies:  Dg Chest Port 1 View  Result Date: 02/13/2017 CLINICAL DATA:  61-year-old male could chest pain. EXAM: PORTABLE CHEST 1 VIEW COMPARISON:  None. FINDINGS: There is emphysematous changes of the lungs with areas of bullous appearing in the upper lobes. There is hyperexpansion of the lungs with bilateral flattening of the diaphragms. Bilateral mid to lower lung field linear densities most consistent with atelectasis/scarring. There is no focal consolidation, pleural effusion, or pneumothorax. The cardiac silhouette is within normal limits. There is atherosclerotic calcification of the aortic arch. No acute osseous pathology. IMPRESSION: 1. No acute cardiopulmonary process. 2. Emphysema. Electronically Signed    By: Anner Crete M.D.   On: 02/13/2017 23:01    Wt Readings from Last 3 Encounters:  02/14/17 63.5 kg (140 lb)    EKG: NSR, low voltage, poor R-wave progression, and non-specific ST segment changes.  Physical Exam: Blood pressure 125/68, pulse 61, temperature 98.7 F (37.1 C), temperature source Oral, resp. rate 16, height 5\' 5"  (1.651 m), weight 63.5 kg (140 lb), SpO2 97 %. Body mass index is 23.3 kg/m. General: Well developed, well nourished, in no acute distress. Head: Normocephalic, atraumatic, sclera non-icteric, no xanthomas, nares are without discharge.  Neck: Negative for carotid bruits. JVD not elevated. Lungs: Clear bilaterally to auscultation without wheezes, rales, or rhonchi. Breathing is unlabored. Heart: Distant heart sounds. RRR with S1 S2. No murmurs, rubs, or gallops appreciated. Abdomen: Soft, non-tender, non-distended with normoactive bowel sounds. No hepatomegaly. No rebound/guarding. No obvious abdominal masses. Msk:  Strength and tone appear normal for age. Extremities: No clubbing or cyanosis. No edema.  Radial and pedal pulses are 2+ bilaterally. Neuro: Alert and oriented X 3. No facial asymmetry. No focal deficit. Moves all extremities spontaneously. Psych:  Responds to questions appropriately with a normal affect.    Assessment and Plan  71 y/o man with no significant past medical history (but also poor medical follow-up), admitted with chest pain for the last 24-36 hours and significant troponin elevation, consistent with NSTEMI.  NSTEMI Patient continues to have mild chest pain on heparin and NTG gtt. 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 We have discussed medical therapy versus left heart catheterization with possible PCI and have agreed to the later.  Plan for LHC with possible PCI this morning. I have reviewed the risks, indications, and alternatives to cardiac catheterization, possible angioplasty, and stenting with the  patient. Risks include but are not limited to bleeding, infection, vascular injury, stroke, myocardial infection, arrhythmia, kidney injury, radiation-related injury in the case of prolonged fluoroscopy use, emergency cardiac surgery, and death. The patient understands the risks of serious complication is 1-2 in 3546 with diagnostic cardiac cath and 1-2% or less with angioplasty/stenting.  Continue heparin infusion and ASA 81 mg daily; defer adding P2Y12 inhibitor pending catheterization in case urgent cardiac surgery is needed.  Titrate NTG infusion for relief of chest pain, as BP allows.  Proceed with transthoracic echocardiogram.  Initiate high-intensity statin and low-dose beta-blocker.  Check lipid panel, hemoglobin A1c, and TSH  Signed, [name] MD 02/14/2017, 7:44 AM Pager: [phone]

## 2017-02-14 NOTE — Interval H&P Note (Signed)
History and Physical Interval Note:  02/14/2017 9:14 AM  Thomas Trujillo  has presented today for cardiac catheterization, with the diagnosis of NSTEMI. The various methods of treatment have been discussed with the patient and family. After consideration of risks, benefits and other options for treatment, the patient has consented to  Procedure(s): Left Heart Cath and Coronary Angiography (N/A) as a surgical intervention .  The patient's history has been reviewed, patient examined, no change in status, stable for surgery.  I have reviewed the patient's chart and labs.  Questions were answered to the patient's satisfaction.    Cath Lab Visit (complete for each Cath Lab visit)  Clinical Evaluation Leading to the Procedure:   ACS: Yes.    Non-ACS: N/A  Thomas Trujillo

## 2017-02-14 NOTE — Anesthesia Procedure Notes (Signed)
Procedures

## 2017-02-14 NOTE — H&P (Addendum)
[address]       [place] [address]             [phone]        Devan Rosevear [place] Medical Record #[id number] Date of Birth: [birth year]  Referring: Dr END Primary Care: [facility], MD  Chief Complaint:   Chest pain    History of Present Illness:     Patient is  71 yo male  who  presented to [facility] ER 04/23 with non radiating chest pain/pressure at rest, diaphoresis, and exertional dyspnea onset the evening of 04/23. The pain was new .  In the ER initial troponin was >65, therefore Cardiology assessed the pt and determined the pt was stable and did not need an urgent cardiac catheterization, plans for cardiac catheterization  04/24.     Last night notes: 11:21 PM on 02/13/2017 -----------------------------------------  Troponin I noted to be over 65, in his very minimal at this time, we'll give him fentanyl to see if that gets the pain under control, he is at a 1 or 2 out of 10. Likely had cardiac ischemia over the last few days. EKG again does not show any evidence of STEMI. We'll discuss with cardiology, patient is been admitted to the hospitalist    PMH of Former Smoker (1 PPD quit date 2014).  Current Activity/ Functional Status: Patient is independent with mobility/ambulation, transfers, ADL's, IADL's.   Zubrod Score: At the time of surgery this patient's most appropriate activity status/level should be described as: [x]     0    Normal activity, no symptoms []     1    Restricted in physical strenuous activity but ambulatory, able to do out light work []     2    Ambulatory and capable of self care, unable to do work activities, up and about                 more than 50%  Of the time                            []     3    Only limited self care, in bed greater than 50% of waking hours []     4    Completely disabled, no self care, confined to bed or chair []     5    Moribund  Past Medical History:  Diagnosis Date  . Patient denies  medical problems     Past Surgical History:  Procedure Laterality Date  . vasectomy    . CATARACT EXTRACTION, BILATERAL    . NASAL POLYP SURGERY      History  Smoking Status  . Former Smoker quit smoking 8 years ago   Smokeless Tobacco  . Never Used    History  Alcohol Use No    Social History:  lives alone    Social History  . Marital status: Divorced    Spouse name: N/A  . Number of children: N/A  . Years of education: N/A   Occupational History  . Not on file.   Social History Main Topics  . Smoking status: Former Research scientist (life sciences)  . Smokeless tobacco: Never Used  . Alcohol use No  . Drug use: No  . Sexual activity: Not on file   Other Topics Concern  . Not on file   Social History Narrative  . No narrative on file    Allergies  Allergen Reactions  . No Known Allergies     Current Facility-Administered Medications  Medication Dose Route Frequency Provider Last Rate Last Dose  . 0.9 % irrigation (POUR BTL)    PRN [name], MD   6,000 mL at 02/14/17 1218  . cefUROXime (ZINACEF) 1.5 g in dextrose 5 % 50 mL IVPB  1.5 g Intravenous To OR [name], MD      . cefUROXime (ZINACEF) 750 mg in dextrose 5 % 50 mL IVPB  750 mg Intravenous To OR [name], MD      . dexmedetomidine (PRECEDEX) 400 MCG/100ML (4 mcg/mL) infusion  0.1-0.7 mcg/kg/hr Intravenous To OR [name], MD      . DOPamine (INTROPIN) 800 mg in dextrose 5 % 250 mL (3.2 mg/mL) infusion  0-10 mcg/kg/min Intravenous To OR [name], MD      . EPINEPHrine (ADRENALIN) 4 mg in dextrose 5 % 250 mL (0.016 mg/mL) infusion  0-10 mcg/min Intravenous To OR [name], MD      . hemostatic agents    PRN [name], MD   1 application at 68/12/75 1219  . heparin 30,000 units/NS 1000 mL solution for CELLSAVER   Other To OR [name], MD      . insulin regular (NOVOLIN R,HUMULIN R) 250 Units in sodium chloride 0.9 % 250 mL (1 Units/mL) infusion   Intravenous To  OR [name], MD      . magnesium sulfate (IV Push/IM) injection 40 mEq  40 mEq Other To OR [name], MD      . nitroGLYCERIN 50 mg in dextrose 5 % 250 mL (0.2 mg/mL) infusion  2-200 mcg/min Intravenous To OR [name], MD      . phenylephrine (NEO-SYNEPHRINE) 20 mg in sodium chloride 0.9 % 250 mL (0.08 mg/mL) infusion  30-200 mcg/min Intravenous To OR [name], MD      . potassium chloride injection 80 mEq  80 mEq Other To OR [name], MD      . Surgifoam 1 Gm with 0.9% sodium chloride (4 ml) topical solution    PRN [name], MD      . Surgifoam 1 Gm with 0.9% sodium chloride (4 ml) topical solution    PRN [name], MD      . Surgifoam 1 Gm with 0.9% sodium chloride (4 ml) topical solution    PRN [name], MD      . tranexamic acid (CYKLOKAPRON) 2,500 mg in sodium chloride 0.9 % 250 mL (10 mg/mL) infusion  1.5 mg/kg/hr Intravenous To OR [name], MD      . tranexamic acid (CYKLOKAPRON) bolus via infusion - over 30 minutes 952.5 mg  15 mg/kg Intravenous To OR [name], MD      . tranexamic acid (CYKLOKAPRON) pump prime solution 127 mg  2 mg/kg Intracatheter To OR [name], MD      . vancomycin (VANCOCIN) 1,250 mg in sodium chloride 0.9 % 250 mL IVPB  1,250 mg Intravenous To OR [name], MD        Prescriptions Prior to Admission  Medication Sig Dispense Refill Last Dose  . aspirin EC 81 MG EC tablet Take 1 tablet (81 mg total) by mouth daily. 30 tablet 0   . atorvastatin (LIPITOR) 80 MG tablet Take 1 tablet (80 mg total) by mouth daily at 6 PM. 30 tablet  0   . metoprolol tartrate (LOPRESSOR) 25 MG tablet Take 0.5 tablets (12.5 mg total) by mouth 2 (two) times daily. 60 tablet 0     Family History  Problem Relation Age of Onset  . Obesity Son      Review of Systems:        Cardiac Review of Systems: Y or N  Chest Pain [   y ]  Resting SOB [ y  ] Exertional SOB  Blue.Reese  ]  Orthopnea [  y ]   Pedal Edema [ n  ]    Palpitations [ n ] Syncope  [n  ]   Presyncope [ n  ]  General Review of Systems: [Y] = yes [  ]=no Constitional: recent weight change [ n ]; anorexia [  ]; fatigue [  ]; nausea [  ]; night sweats [  ]; fever [  ]; or chills [  ]                                                               Dental: poor dentition[  ]; Last Dentist visit:   Eye : blurred vision [  ]; diplopia [   ]; vision changes [  ];  Amaurosis fugax[  ]; Resp: cough [ n ];  wheezing[n  ];  hemoptysis[ n ]; shortness of breath[y  ]; paroxysmal nocturnal dyspnea[  ]; dyspnea on exertion[  ]; or orthopnea[  ];  GI:  gallstones[  ], vomiting[  ];  dysphagia[  ]; melena[  ];  hematochezia [  ]; heartburn[  ];   Hx of  Colonoscopy[  ]; GU: kidney stones [  ]; hematuria[  ];   dysuria [  ];  nocturia[  ];  history of     obstruction [  ]; urinary frequency [  ]             Skin: rash, swelling[  ];, hair loss[  ];  peripheral edema[  ];  or itching[  ]; Musculosketetal: myalgias[  ];  joint swelling[  ];  joint erythema[  ];  joint pain[  ];  back pain[  ];  Heme/Lymph: bruising[  ];  bleeding[  ];  anemia[  ];  Neuro: TIA[  ];  headaches[  ];  stroke[  ];  vertigo[  ];  seizures[  ];   paresthesias[  n];  difficulty walking[ n ];  Psych:depression[  ]; anxiety[  ];  Endocrine: diabetes[n  ];  thyroid dysfunction[  n];  Immunizations: Flu Florencio.Farrier  ]; Pneumococcal[ n ];  Other:  Physical Exam: Wt 139 lb 15.9 oz (63.5 kg)   BMI 23.30 kg/m    General appearance: alert, cooperative, appears older than stated age and no distress Head: Normocephalic, without obvious abnormality, atraumatic Neck: no adenopathy, no carotid bruit, no JVD, supple, symmetrical, trachea midline and thyroid not enlarged, symmetric, no tenderness/mass/nodules Lymph nodes: Cervical, supraclavicular, and axillary nodes normal. Resp: diminished breath sounds bibasilar Back: symmetric, no curvature. ROM normal. No CVA  tenderness. Cardio: regular rate and rhythm, S1, S2 normal, no murmur, click, rub or gallop GI: soft, non-tender; bowel sounds normal; no masses,  no organomegaly Extremities: extremities normal, atraumatic, no cyanosis or edema and Homans sign is negative, no sign of  DVT Neurologic: Grossly normal Palpable  dp and pt pulses  Radial band on right wrist , bleed when left down slightly , right hand intact   Diagnostic Studies & Laboratory data:     Recent Radiology Findings:   Dg Chest Port 1 View  Result Date: 02/13/2017 CLINICAL DATA:  19-year-old male could chest pain. EXAM: PORTABLE CHEST 1 VIEW COMPARISON:  None. FINDINGS: There is emphysematous changes of the lungs with areas of bullous appearing in the upper lobes. There is hyperexpansion of the lungs with bilateral flattening of the diaphragms. Bilateral mid to lower lung field linear densities most consistent with atelectasis/scarring. There is no focal consolidation, pleural effusion, or pneumothorax. The cardiac silhouette is within normal limits. There is atherosclerotic calcification of the aortic arch. No acute osseous pathology. IMPRESSION: 1. No acute cardiopulmonary process. 2. Emphysema. Electronically Signed   By: Anner Crete M.D.   On: 02/13/2017 23:01     I have independently reviewed the above radiologic studies.  Recent Lab Findings: Lab Results  Component Value Date   WBC 7.6 02/14/2017   HGB 13.4 02/14/2017   HCT 38.8 (L) 02/14/2017   PLT 198 02/14/2017   GLUCOSE 97 02/14/2017   CHOL 114 02/14/2017   TRIG 51 02/14/2017   HDL 39 (L) 02/14/2017   LDLCALC 65 02/14/2017   NA 138 02/14/2017   K 3.9 02/14/2017   CL 103 02/14/2017   CREATININE 0.99 02/14/2017   BUN 19 02/14/2017   CO2 28 02/14/2017   TSH 1.836 02/14/2017   INR 1.11 02/13/2017   Cath: complex critical proximal LAD disease  Procedures   Left Heart Cath and Coronary Angiography  Conclusion   Conclusions: 1. Significant two-vessel  coronary artery disease, including sequential 95% ostial and 80-90% mid LAD stenoses, as well as 80% ostial ramus intermedius stenosis. The ostial LAD disease extends back to the true ostium without landing zone for stent placement without overhand into the LMCA. 2. Moderately reduced left ventricular contraction with mid and apical anterior hypokinesis (LVEF 40-45%). 3. Upper normal left ventricular filling pressure.  Recommendations: 1. Images reviewed with interventional cardiology and cardiac surgery teams at [facility]. Given anatomy of ostial LAD lesion, we will transfer to [facility] for emergent CABG. 2. Restart heparin 2 hours after TR band removal if not yet in the OR. 3. Aggressive secondary prevention.  [name], MD [facility] Pager: [phone]       Assessment / Plan:      With, critical lad disease and ongoing chest pain and positive enzymes of acute mi agree with plant to proceed with emergency CABG. Discussed with patient and son who are agreeable.    The risks and  goals risks and alternatives of the planned surgical procedure Procedure(s): CORONARY ARTERY BYPASS GRAFTING (CABG) (N/A) TRANSESOPHAGEAL ECHOCARDIOGRAM (TEE) (N/A)  have been discussed with the patient in detail. The risks of the procedure including death, infection, stroke, myocardial infarction, bleeding, blood transfusion have all been discussed specifically.  I have quoted Theotis Barrio a 5 % of perioperative mortality and a complication rate as high as 40 %. The patient's questions have been answered.Deon Duer is willing  to proceed with the planned procedure.   [name] MD      [address] [place] [address] Office [phone]   Beeper [phone]  02/14/2017 12:32 PM

## 2017-02-14 NOTE — Anesthesia Preprocedure Evaluation (Addendum)
Anesthesia Evaluation  Patient identified by MRN, date of birth, ID band Patient awake    Reviewed: Allergy & Precautions, NPO status , Patient's Chart, lab work & pertinent test results, Unable to perform ROS - Chart review only  Airway Mallampati: II  TM Distance: >3 FB   Mouth opening: Pediatric Airway  Dental  (+) Teeth Intact, Dental Advisory Given   Pulmonary former smoker,    breath sounds clear to auscultation       Cardiovascular + Past MI   Rhythm:Regular Rate:Normal     Neuro/Psych    GI/Hepatic   Endo/Other    Renal/GU      Musculoskeletal   Abdominal   Peds  Hematology   Anesthesia Other Findings   Reproductive/Obstetrics                            Anesthesia Physical Anesthesia Plan  ASA: III and emergent  Anesthesia Plan: General   Post-op Pain Management:    Induction: Intravenous  Airway Management Planned: Oral ETT  Additional Equipment: Arterial line, CVP, PA Cath and TEE  Intra-op Plan:   Post-operative Plan: Post-operative intubation/ventilation  Informed Consent: I have reviewed the patients History and Physical, chart, labs and discussed the procedure including the risks, benefits and alternatives for the proposed anesthesia with the patient or authorized representative who has indicated his/her understanding and acceptance.     Plan Discussed with:   Anesthesia Plan Comments:        Anesthesia Quick Evaluation

## 2017-02-14 NOTE — Progress Notes (Signed)
Per [facility] RN patient will transfer directly to [facility] for emergent bypass.  Belongings given to [facility] from [facility].  [facility] will transport.

## 2017-02-14 NOTE — Progress Notes (Signed)
ANTICOAGULATION CONSULT NOTE - Initial Consult  Pharmacy Consult for heparin dosing Indication: chest pain/ACS  Not on File  Patient Measurements: Height: 5\' 5"  (165.1 cm) Weight: 140 lb (63.5 kg) IBW/kg (Calculated) : 61.5 Heparin Dosing Weight: 63.5 kg  Vital Signs: Temp: 98.3 F (36.8 C) (04/23 2214) Temp Source: Oral (04/23 2214) BP: 125/68 (04/23 2345) Pulse Rate: 85 (04/23 2345)  Labs:  Recent Labs  02/13/17 2212  HGB 15.2  HCT 44.3  PLT 222  APTT 30  LABPROT 14.3  INR 1.11  CREATININE 1.00  TROPONINI >65.00*    Estimated Creatinine Clearance: 59.8 mL/min (by C-G formula based on SCr of 1 mg/dL).   Medical History: Past Medical History:  Diagnosis Date  . Patient denies medical problems     Medications:  Scheduled:  . heparin  3,800 Units Intravenous Once    Assessment: Patient admitted w/ CP and found to have trops > 65.00 Pt. Is being started on heparin drip  Goal of Therapy:  Heparin level 0.3-0.7 units/ml Monitor platelets by anticoagulation protocol: Yes   Plan:  Give 3800 units bolus x 1  Will initiate heparin drip at 800 units/hour and will check HL at 0900. Baseline labs ordered and WNL  Thank you for this consult.  Tobie Lords, PharmD, BCPS Clinical Pharmacist 02/14/2017

## 2017-02-14 NOTE — Progress Notes (Signed)
Patient ID: Thomas Trujillo, male   DOB: 09/13/1946, 71 y.o.   MRN: [id number] EVENING ROUNDS NOTE :     [address]       [place] [address]             [phone]                 Day of Surgery Procedure(s) (LRB): CORONARY ARTERY BYPASS GRAFTING (CABG) x 2 , using left internal mammary artery and right leg greater saphenous vein harvested endoscopically LIMA-LAD, SVG-RAMUS (N/A) TRANSESOPHAGEAL ECHOCARDIOGRAM (TEE) (N/A) STAPLING OF LARGE LEFT UPPER LOBE PULMONARY BLEB (N/A) ENDOVEIN HARVEST OF RIGHT THIGH GREATER SAPHENOUS VEIN (Right)  Total Length of Stay:  LOS: 0 days  BP 124/73   Pulse 98   Temp (!) 96.4 F (35.8 C)   Resp 12   Wt 139 lb 15.9 oz (63.5 kg)   SpO2 100%   BMI 23.30 kg/m   .Intake/Output      04/24 0701 - 04/25 0700   I.V. (mL/kg) 2707.4 (42.6)   Blood 250   IV Piggyback 1100   Total Intake(mL/kg) 4057.4 (63.9)   Urine (mL/kg/hr) 2150   Blood 600   Chest Tube 90   Total Output 2840   Net +1217.4         . sodium chloride    . [START ON 02/15/2017] sodium chloride    . sodium chloride 20 mL/hr at 02/14/17 1815  . albumin human    . [START ON 02/15/2017] cefUROXime (ZINACEF)  IV    . dexmedetomidine (PRECEDEX) IV infusion 0.05 mcg/kg/hr (02/14/17 1815)  . famotidine (PEPCID) IV    . insulin (NOVOLIN-R) infusion Stopped (02/14/17 1815)  . lactated ringers    . lactated ringers 20 mL/hr at 02/14/17 1815  . lactated ringers 20 mL/hr at 02/14/17 1815  . magnesium sulfate 4 g (02/14/17 1837)  . nitroGLYCERIN Stopped (02/14/17 1843)  . phenylephrine (NEO-SYNEPHRINE) Adult infusion 25 mcg/min (02/14/17 1902)  . potassium chloride (KCL MULTIRUN) 30 mEq in 265 mL IVPB 30 mEq (02/14/17 1848)  . [START ON 02/15/2017] vancomycin       Lab Results  Component Value Date   WBC 10.8 (H) 02/14/2017   HGB 9.6 (L) 02/14/2017   HCT 28.1 (L) 02/14/2017   PLT PENDING 02/14/2017   GLUCOSE 102 (H) 02/14/2017   CHOL 114 02/14/2017   TRIG 51  02/14/2017   HDL 39 (L) 02/14/2017   LDLCALC 65 02/14/2017   NA 137 02/14/2017   K 4.1 02/14/2017   CL 102 02/14/2017   CREATININE 0.70 02/14/2017   BUN 14 02/14/2017   CO2 28 02/14/2017   TSH 1.836 02/14/2017   INR 1.62 02/14/2017   Dg Chest Port 1 View  Result Date: 02/14/2017 CLINICAL DATA:  Postoperative radiograph, status post CABG. Initial encounter. EXAM: PORTABLE CHEST 1 VIEW COMPARISON:  Chest radiograph performed 02/13/2017 FINDINGS: The patient's endotracheal tube is seen ending 4 cm above the carina. An enteric tube is noted ending overlying the fundus of the stomach. The right IJ Swan-Ganz catheter is noted ending about the pulmonary outflow tract. A left-sided chest tube is noted. No definite pneumothorax is seen. Mild vascular congestion is noted. Underlying bullous change is noted bilaterally. No pleural effusion is identified. The cardiomediastinal silhouette is borderline normal in size. The patient is status post median sternotomy, with evidence of CABG. No acute osseous abnormalities are identified. IMPRESSION: 1. Endotracheal tube seen ending 4 cm above the carina. Remaining tubes  and lines as described above. 2. Mild vascular congestion noted.  Lungs remain grossly clear. 3. Underlying bilateral emphysematous bulla noted. Electronically Signed   By: Garald Balding M.D.   On: 02/14/2017 18:44   Dg Chest Port 1 View  Result Date: 02/13/2017 CLINICAL DATA:  55-year-old male could chest pain. EXAM: PORTABLE CHEST 1 VIEW COMPARISON:  None. FINDINGS: There is emphysematous changes of the lungs with areas of bullous appearing in the upper lobes. There is hyperexpansion of the lungs with bilateral flattening of the diaphragms. Bilateral mid to lower lung field linear densities most consistent with atelectasis/scarring. There is no focal consolidation, pleural effusion, or pneumothorax. The cardiac silhouette is within normal limits. There is atherosclerotic calcification of the aortic  arch. No acute osseous pathology. IMPRESSION: 1. No acute cardiopulmonary process. 2. Emphysema. Electronically Signed   By: Anner Crete M.D.   On: 02/13/2017 23:01   Not bleeding Still  asleep bp stable   [name] MD  Beeper [phone] Office [phone] 02/14/2017 7:05 PM

## 2017-02-14 NOTE — Discharge Summary (Signed)
[facility] NAME: Thomas Trujillo    MR#:  [id number]  DATE OF BIRTH:  December 15, 1945  DATE OF ADMISSION:  02/13/2017 ADMITTING PHYSICIAN: Lance Coon, MD  DATE OF DISCHARGE: 02/14/17  PRIMARY CARE PHYSICIAN: FELDPAUSCH, DALE E, MD    ADMISSION DIAGNOSIS:  Chest pain [R07.9] Chest pain, unspecified type [R07.9] NSTEMI (non-ST elevated myocardial infarction) ([place]) [I21.4]  DISCHARGE DIAGNOSIS:  Acute NSTEMI s/p Cath showing severe CAD---pt being transferred for CABG to [facility]  SECONDARY DIAGNOSIS:   Past Medical History:  Diagnosis Date  . Patient denies medical problems     HOSPITAL COURSE:  Thomas Trujillo  is a 71 y.o. male who presents with Chest pain. Patient states he had an acute episode of onset of chest pain associated with diaphoresis and shortness of breath about 24 hours ago. He has had persistent chest pain since that time to a lesser degree. He came in tonight to be evaluated and his initial troponin was greater than 65.  1. Acute NSTEMI (non-ST elevated myocardial infarction) ([place])  -pt was on IV heparin drip , nitro and asa along with BB -troponin >65, >65,50.4 . Pt is s/p cath by Dr End  1. showed Significant two-vessel coronary artery disease, including sequential 95% ostial and 80-90% mid LAD stenoses, as well as 80% ostial ramus intermedius stenosis. The ostial LAD disease extends back to the true ostium without landing zone for stent placement without overhand into the LMCA. Moderately reduced left ventricular contraction with mid and apical anterior hypokinesis (LVEF 40-45%).  Emergent transfer to [facility] for CABG. Pt does not need Heparin gtt per Dr End  2.Relative hypotension -pt asymptomatic   CONSULTS OBTAINED:  Treatment Team:  [facility], MD [place] Hampshire, MD [name], MD  DRUG ALLERGIES:   Allergies  Allergen Reactions  . No Known Allergies     DISCHARGE MEDICATIONS:    Current Discharge Medication List    START taking these medications   Details  aspirin EC 81 MG EC tablet Take 1 tablet (81 mg total) by mouth daily. Qty: 30 tablet, Refills: 0    atorvastatin (LIPITOR) 80 MG tablet Take 1 tablet (80 mg total) by mouth daily at 6 PM. Qty: 30 tablet, Refills: 0    metoprolol tartrate (LOPRESSOR) 25 MG tablet Take 0.5 tablets (12.5 mg total) by mouth 2 (two) times daily. Qty: 60 tablet, Refills: 0        If you experience worsening of your admission symptoms, develop shortness of breath, life threatening emergency, suicidal or homicidal thoughts you must seek medical attention immediately by calling 911 or calling your MD immediately  if symptoms less severe.  You Must read complete instructions/literature along with all the possible adverse reactions/side effects for all the Medicines you take and that have been prescribed to you. Take any new Medicines after you have completely understood and accept all the possible adverse reactions/side effects.   Please note  You were cared for by a hospitalist during your hospital stay. If you have any questions about your discharge medications or the care you received while you were in the hospital after you are discharged, you can call the unit and asked to speak with the hospitalist on call if the hospitalist that took care of you is not available. Once you are discharged, your primary care physician will handle any further medical issues. Please note that NO REFILLS for any discharge medications will be authorized once  you are discharged, as it is imperative that you return to your primary care physician (or establish a relationship with a primary care physician if you do not have one) for your aftercare needs so that they can reassess your need for medications and monitor your lab values. Today   SUBJECTIVE   Seen in specials recovery. Mild upper chest pain. Pt being transferred to [facility]:  Blood  pressure 100/60, pulse 72, temperature 98.3 F (36.8 C), temperature source Oral, resp. rate 18, height 5\' 5"  (1.651 m), weight 63.5 kg (140 lb), SpO2 92 %.  I/O:   Intake/Output Summary (Last 24 hours) at 02/14/17 1120 Last data filed at 02/14/17 0900  Gross per 24 hour  Intake            64.67 ml  Output                0 ml  Net            64.67 ml    PHYSICAL EXAMINATION:  GENERAL:  71 y.o.-year-old patient lying in the bed with no acute distress.  EYES: Pupils equal, round, reactive to light and accommodation. No scleral icterus. Extraocular muscles intact.  HEENT: Head atraumatic, normocephalic. Oropharynx and nasopharynx clear.  NECK:  Supple, no jugular venous distention. No thyroid enlargement, no tenderness.  LUNGS: Normal breath sounds bilaterally, no wheezing, rales,rhonchi or crepitation. No use of accessory muscles of respiration.  CARDIOVASCULAR: S1, S2 normal. No murmurs, rubs, or gallops.  ABDOMEN: Soft, non-tender, non-distended. Bowel sounds present. No organomegaly or mass.  EXTREMITIES: No pedal edema, cyanosis, or clubbing.  NEUROLOGIC: Cranial nerves II through XII are intact. Muscle strength 5/5 in all extremities. Sensation intact. Gait not checked.  PSYCHIATRIC: The patient is alert and oriented x 3.  SKIN: No obvious rash, lesion, or ulcer.   DATA REVIEW:   CBC   Recent Labs Lab 02/14/17 0843  WBC 7.6  HGB 13.4  HCT 38.8*  PLT 198    Chemistries   Recent Labs Lab 02/14/17 0843  NA 138  K 3.9  CL 103  CO2 28  GLUCOSE 97  BUN 19  CREATININE 0.99  CALCIUM 8.5*    Microbiology Results   Recent Results (from the past 240 hour(s))  MRSA PCR Screening     Status: None   Collection Time: 02/14/17 12:42 AM  Result Value Ref Range Status   MRSA by PCR NEGATIVE NEGATIVE Final    Comment:        The GeneXpert MRSA Assay (FDA approved for NASAL specimens only), is one component of a comprehensive MRSA colonization surveillance program. It  is not intended to diagnose MRSA infection nor to guide or monitor treatment for MRSA infections.     RADIOLOGY:  Dg Chest Port 1 View  Result Date: 02/13/2017 CLINICAL DATA:  71-year-old male could chest pain. EXAM: PORTABLE CHEST 1 VIEW COMPARISON:  None. FINDINGS: There is emphysematous changes of the lungs with areas of bullous appearing in the upper lobes. There is hyperexpansion of the lungs with bilateral flattening of the diaphragms. Bilateral mid to lower lung field linear densities most consistent with atelectasis/scarring. There is no focal consolidation, pleural effusion, or pneumothorax. The cardiac silhouette is within normal limits. There is atherosclerotic calcification of the aortic arch. No acute osseous pathology. IMPRESSION: 1. No acute cardiopulmonary process. 2. Emphysema. Electronically Signed   By: Anner Crete M.D.   On: 02/13/2017 23:01     Management plans discussed  with the patient, family and they are in agreement.  CODE STATUS:     Code Status Orders        Start     Ordered   02/14/17 0103  Full code  Continuous     02/14/17 0102    Code Status History    Date Active Date Inactive Code Status Order ID Comments User Context   This patient has a current code status but no historical code status.      TOTAL TIME TAKING CARE OF THIS PATIENT: *40* minutes.    Kaelob Persky M.D on 02/14/2017 at 11:20 AM  Between 7am to 6pm - Pager - [phone] After 6pm go to [email or web address] - password [email or web address] [facility]  Office  [phone]  CC: Primary care physician; [facility], Chrissie Noa, MD

## 2017-02-14 NOTE — Progress Notes (Signed)
ANTICOAGULATION CONSULT NOTE - Initial Consult  Pharmacy Consult for heparin dosing Indication: chest pain/ACS  No Known Allergies  Patient Measurements: Height: 5\' 5"  (165.1 cm) Weight: 140 lb (63.5 kg) IBW/kg (Calculated) : 61.5 Heparin Dosing Weight: 63.5 kg  Vital Signs: Temp: 98.3 F (36.8 C) (04/24 0909) Temp Source: Oral (04/24 0909) BP: 105/60 (04/24 0909) Pulse Rate: 79 (04/24 0909)  Labs:  Recent Labs  02/13/17 2212 02/14/17 0041 02/14/17 0138 02/14/17 0843  HGB 15.2  --   --  13.4  HCT 44.3  --   --  38.8*  PLT 222  --   --  198  APTT 30  --   --   --   LABPROT 14.3  --   --   --   INR 1.11  --   --   --   HEPARINUNFRC  --  <0.10*  --  0.32  CREATININE 1.00  --   --  0.99  TROPONINI >65.00*  --  >65.00* 50.17*    Estimated Creatinine Clearance: 60.4 mL/min (by C-G formula based on SCr of 0.99 mg/dL).   Medical History: Past Medical History:  Diagnosis Date  . Patient denies medical problems     Medications:  Scheduled:  . [MAR Hold] aspirin EC  81 mg Oral Daily  . [MAR Hold] atorvastatin  80 mg Oral q1800  . [MAR Hold] metoprolol tartrate  12.5 mg Oral BID  . [MAR Hold] sodium chloride flush  3 mL Intravenous Q12H  . sodium chloride flush  3 mL Intravenous Q12H    Assessment: Patient admitted w/ CP and found to have trops > 65.00 Pt. Is being started on heparin drip  Goal of Therapy:  Heparin level 0.3-0.7 units/ml Monitor platelets by anticoagulation protocol: Yes   Plan:  Give 3800 units bolus x 1  Will initiate heparin drip at 800 units/hour and will check HL at 0900. Baseline labs ordered and WNL  4/24 08:43  HL = 0.32. Continue current drip rate.  Recheck HL in 8 hours on 4/24 at 17:00.   Thank you for this consult.  Olivia Canter, [facility] Clinical Pharmacist 02/14/2017, 10:56 AM

## 2017-02-15 ENCOUNTER — Inpatient Hospital Stay (HOSPITAL_COMMUNITY): Payer: Medicare Other

## 2017-02-15 ENCOUNTER — Encounter (HOSPITAL_COMMUNITY): Payer: Self-pay

## 2017-02-15 LAB — CBC
HCT: 26 % — ABNORMAL LOW (ref 39.0–52.0)
HCT: 26.9 % — ABNORMAL LOW (ref 39.0–52.0)
HEMATOCRIT: 25.1 % — AB (ref 39.0–52.0)
HEMOGLOBIN: 8.4 g/dL — AB (ref 13.0–17.0)
Hemoglobin: 8.9 g/dL — ABNORMAL LOW (ref 13.0–17.0)
Hemoglobin: 8.9 g/dL — ABNORMAL LOW (ref 13.0–17.0)
MCH: 30.4 pg (ref 26.0–34.0)
MCH: 31.6 pg (ref 26.0–34.0)
MCH: 30.4 pg (ref 26.0–34.0)
MCHC: 33.5 g/dL (ref 30.0–36.0)
MCHC: 34.2 g/dL (ref 30.0–36.0)
MCHC: 33.1 g/dL (ref 30.0–36.0)
MCV: 90.9 fL (ref 78.0–100.0)
MCV: 92.2 fL (ref 78.0–100.0)
MCV: 91.8 fL (ref 78.0–100.0)
PLATELETS: 120 10*3/uL — AB (ref 150–400)
Platelets: 114 10*3/uL — ABNORMAL LOW (ref 150–400)
Platelets: 127 10*3/uL — ABNORMAL LOW (ref 150–400)
RBC: 2.76 MIL/uL — AB (ref 4.22–5.81)
RBC: 2.82 MIL/uL — ABNORMAL LOW (ref 4.22–5.81)
RBC: 2.93 MIL/uL — ABNORMAL LOW (ref 4.22–5.81)
RDW: 13.5 % (ref 11.5–15.5)
RDW: 13.7 % (ref 11.5–15.5)
RDW: 13.5 % (ref 11.5–15.5)
WBC: 8.8 10*3/uL (ref 4.0–10.5)
WBC: 11.7 10*3/uL — ABNORMAL HIGH (ref 4.0–10.5)
WBC: 10.8 10*3/uL — ABNORMAL HIGH (ref 4.0–10.5)

## 2017-02-15 LAB — POCT I-STAT 3, ART BLOOD GAS (G3+)
Acid-base deficit: 2 mmol/L (ref 0.0–2.0)
Acid-base deficit: 2 mmol/L (ref 0.0–2.0)
Acid-base deficit: 5 mmol/L — ABNORMAL HIGH (ref 0.0–2.0)
Bicarbonate: 22.2 mmol/L (ref 20.0–28.0)
Bicarbonate: 23.3 mmol/L (ref 20.0–28.0)
Bicarbonate: 23.7 mmol/L (ref 20.0–28.0)
O2 Saturation: 91 %
O2 Saturation: 96 %
O2 Saturation: 96 %
Patient temperature: 36.9
Patient temperature: 37.3
Patient temperature: 37.5
TCO2: 24 mmol/L (ref 0–100)
TCO2: 25 mmol/L (ref 0–100)
TCO2: 25 mmol/L (ref 0–100)
pCO2 arterial: 42.7 mmHg (ref 32.0–48.0)
pCO2 arterial: 46.8 mmHg (ref 32.0–48.0)
pCO2 arterial: 49.9 mmHg — ABNORMAL HIGH (ref 32.0–48.0)
pH, Arterial: 7.259 — ABNORMAL LOW (ref 7.350–7.450)
pH, Arterial: 7.316 — ABNORMAL LOW (ref 7.350–7.450)
pH, Arterial: 7.344 — ABNORMAL LOW (ref 7.350–7.450)
pO2, Arterial: 73 mmHg — ABNORMAL LOW (ref 83.0–108.0)
pO2, Arterial: 88 mmHg (ref 83.0–108.0)
pO2, Arterial: 94 mmHg (ref 83.0–108.0)

## 2017-02-15 LAB — BASIC METABOLIC PANEL
ANION GAP: 6 (ref 5–15)
BUN: 12 mg/dL (ref 6–20)
CALCIUM: 7.8 mg/dL — AB (ref 8.9–10.3)
CHLORIDE: 104 mmol/L (ref 101–111)
CO2: 25 mmol/L (ref 22–32)
Creatinine, Ser: 0.84 mg/dL (ref 0.61–1.24)
GFR calc non Af Amer: >60 mL/min (ref >60)
Glucose, Bld: 148 mg/dL — ABNORMAL HIGH (ref 65–99)
Potassium: 4 mmol/L (ref 3.5–5.1)
Sodium: 135 mmol/L (ref 135–145)

## 2017-02-15 LAB — GLUCOSE, CAPILLARY
Glucose-Capillary: 144 mg/dL — ABNORMAL HIGH (ref 65–99)
Glucose-Capillary: 137 mg/dL — ABNORMAL HIGH (ref 65–99)
Glucose-Capillary: 147 mg/dL — ABNORMAL HIGH (ref 65–99)
Glucose-Capillary: 111 mg/dL — ABNORMAL HIGH (ref 65–99)
Glucose-Capillary: 113 mg/dL — ABNORMAL HIGH (ref 65–99)
Glucose-Capillary: 126 mg/dL — ABNORMAL HIGH (ref 65–99)

## 2017-02-15 LAB — CREATININE, SERUM
Creatinine, Ser: 0.86 mg/dL (ref 0.61–1.24)
Creatinine, Ser: 1.01 mg/dL (ref 0.61–1.24)
GFR calc Af Amer: >60 mL/min (ref >60)
GFR calc non Af Amer: >60 mL/min (ref >60)

## 2017-02-15 LAB — POCT I-STAT, CHEM 8
BUN: 13 mg/dL (ref 6–20)
Calcium, Ion: 1.17 mmol/L (ref 1.15–1.40)
Chloride: 95 mmol/L — ABNORMAL LOW (ref 101–111)
Creatinine, Ser: 1 mg/dL (ref 0.61–1.24)
Glucose, Bld: 123 mg/dL — ABNORMAL HIGH (ref 65–99)
HCT: 25 % — ABNORMAL LOW (ref 39.0–52.0)
Hemoglobin: 8.5 g/dL — ABNORMAL LOW (ref 13.0–17.0)
Potassium: 4 mmol/L (ref 3.5–5.1)
Sodium: 138 mmol/L (ref 135–145)
TCO2: 30 mmol/L (ref 0–100)

## 2017-02-15 LAB — HEMOGLOBIN A1C
Hgb A1c MFr Bld: 5.5 % (ref 4.8–5.6)
Mean Plasma Glucose: 111 mg/dL

## 2017-02-15 LAB — MAGNESIUM
Magnesium: 2.5 mg/dL — ABNORMAL HIGH (ref 1.7–2.4)
Magnesium: 2.3 mg/dL (ref 1.7–2.4)

## 2017-02-15 MED ORDER — FUROSEMIDE 10 MG/ML IJ SOLN
40.0000 mg | Freq: Once | INTRAMUSCULAR | Status: AC
  Administered 2017-02-15: 40 mg via INTRAVENOUS
  Filled 2017-02-15: qty 4

## 2017-02-15 MED ORDER — ENOXAPARIN SODIUM 40 MG/0.4ML ~~LOC~~ SOLN
40.0000 mg | Freq: Every day | SUBCUTANEOUS | Status: DC
  Administered 2017-02-15 – 2017-02-16 (×2): 40 mg via SUBCUTANEOUS
  Filled 2017-02-15 (×2): qty 0.4

## 2017-02-15 MED ORDER — ORAL CARE MOUTH RINSE
15.0000 mL | Freq: Two times a day (BID) | OROMUCOSAL | Status: DC
  Administered 2017-02-15 – 2017-02-16 (×3): 15 mL via OROMUCOSAL

## 2017-02-15 MED ORDER — INSULIN ASPART 100 UNIT/ML ~~LOC~~ SOLN
0.0000 [IU] | SUBCUTANEOUS | Status: DC
  Administered 2017-02-16 (×2): 2 [IU] via SUBCUTANEOUS

## 2017-02-15 MED ORDER — METOCLOPRAMIDE HCL 5 MG/ML IJ SOLN
10.0000 mg | Freq: Three times a day (TID) | INTRAMUSCULAR | Status: AC
  Administered 2017-02-15 – 2017-02-16 (×3): 10 mg via INTRAVENOUS
  Filled 2017-02-15 (×3): qty 2

## 2017-02-15 MED FILL — Lidocaine HCl IV Inj 20 MG/ML: INTRAVENOUS | Qty: 5 | Status: AC

## 2017-02-15 MED FILL — Sodium Chloride IV Soln 0.9%: INTRAVENOUS | Qty: 2000 | Status: AC

## 2017-02-15 MED FILL — Mannitol IV Soln 20%: INTRAVENOUS | Qty: 500 | Status: AC

## 2017-02-15 MED FILL — Heparin Sodium (Porcine) Inj 1000 Unit/ML: INTRAMUSCULAR | Qty: 10 | Status: AC

## 2017-02-15 MED FILL — Sodium Bicarbonate IV Soln 8.4%: INTRAVENOUS | Qty: 50 | Status: AC

## 2017-02-15 MED FILL — Electrolyte-R (PH 7.4) Solution: INTRAVENOUS | Qty: 4000 | Status: AC

## 2017-02-15 NOTE — Progress Notes (Signed)
Patient ID: Thomas Trujillo, male   DOB: 1946-02-26, 71 y.o.   MRN: [id number] TCTS DAILY ICU PROGRESS NOTE                   [address]            [place] [address]          [phone]   1 Day Post-Op Procedure(s) (LRB): CORONARY ARTERY BYPASS GRAFTING (CABG) x 2 , using left internal mammary artery and right leg greater saphenous vein harvested endoscopically LIMA-LAD, SVG-RAMUS (N/A) TRANSESOPHAGEAL ECHOCARDIOGRAM (TEE) (N/A) STAPLING OF LARGE LEFT UPPER LOBE PULMONARY BLEB (N/A) ENDOVEIN HARVEST OF RIGHT THIGH GREATER SAPHENOUS VEIN (Right)  Total Length of Stay:  LOS: 1 day   Subjective: Extubated last night, awake and neuro intact  Objective: Vital signs in last 24 hours: Temp:  [96.4 F (35.8 C)-99.9 F (37.7 C)] 97.7 F (36.5 C) (04/25 0630) Pulse Rate:  [60-110] 95 (04/25 0630) Cardiac Rhythm: Sinus tachycardia (04/25 0400) Resp:  [10-28] 13 (04/25 0630) BP: (95-125)/(53-79) 117/56 (04/25 0530) SpO2:  [92 %-100 %] 99 % (04/25 0630) Arterial Line BP: (89-150)/(37-65) 113/47 (04/25 0630) FiO2 (%):  [40 %-50 %] 40 % (04/25 0305) Weight:  [139 lb 15.9 oz (63.5 kg)-167 lb 12.3 oz (76.1 kg)] 167 lb 12.3 oz (76.1 kg) (04/25 0500)  Filed Weights   02/14/17 1059 02/15/17 0500  Weight: 139 lb 15.9 oz (63.5 kg) 167 lb 12.3 oz (76.1 kg)    Weight change:    Hemodynamic parameters for last 24 hours: PAP: (19-50)/(7-24) 47/21 CO:  [4 L/min-6.2 L/min] 6.2 L/min CI:  [2.4 L/min/m2-3.6 L/min/m2] 3.6 L/min/m2  Intake/Output from previous day: 04/24 0701 - 04/25 0700 In: 6068.3 [I.V.:3633.3; Blood:250; IV Piggyback:2165] Out: 5405 [Urine:4325; Blood:600; Chest Tube:480]  Intake/Output this shift: No intake/output data recorded.  Current Meds: Scheduled Meds: . acetaminophen  1,000 mg Oral Q6H   Or  . acetaminophen (TYLENOL) oral liquid 160 mg/5 mL  1,000 mg Per Tube Q6H  . aspirin EC  325 mg Oral Daily   Or  . aspirin  324 mg Per Tube Daily  .  atorvastatin  80 mg Oral q1800  . bisacodyl  10 mg Oral Daily   Or  . bisacodyl  10 mg Rectal Daily  . docusate sodium  200 mg Oral Daily  . insulin aspart  0-24 Units Subcutaneous Q4H  . levalbuterol  0.63 mg Nebulization Q6H  . mouth rinse  15 mL Mouth Rinse QID  . mouth rinse  15 mL Mouth Rinse BID  . metoprolol tartrate  12.5 mg Oral BID   Or  . metoprolol tartrate  12.5 mg Per Tube BID  . [START ON 02/16/2017] pantoprazole  40 mg Oral Daily  . sodium chloride flush  3 mL Intravenous Q12H   Continuous Infusions: . sodium chloride 20 mL/hr (02/14/17 2300)  . sodium chloride    . sodium chloride Stopped (02/14/17 2300)  . cefUROXime (ZINACEF)  IV Stopped (02/15/17 0143)  . dexmedetomidine (PRECEDEX) IV infusion Stopped (02/14/17 2135)  . famotidine (PEPCID) IV Stopped (02/14/17 2204)  . lactated ringers    . lactated ringers 20 mL/hr at 02/14/17 1815  . lactated ringers Stopped (02/15/17 0500)  . nitroGLYCERIN Stopped (02/14/17 1843)  . phenylephrine (NEO-SYNEPHRINE) Adult infusion 30 mcg/min (02/15/17 7824)   PRN Meds:.sodium chloride, lactated ringers, metoprolol, midazolam, morphine injection, ondansetron (ZOFRAN) IV, oxyCODONE, sodium chloride flush, traMADol  General appearance: alert and cooperative Neurologic: intact Heart:  regular rate and rhythm, S1, S2 normal, no murmur, click, rub or gallop Lungs: diminished breath sounds bibasilar Abdomen: soft, non-tender; bowel sounds normal; no masses,  no organomegaly Extremities: extremities normal, atraumatic, no cyanosis or edema and Homans sign is negative, no sign of DVT Wound: intact, no air leak from chest tube   Lab Results: CBC: Recent Labs  02/14/17 1810 02/15/17 0327  WBC 10.8* 8.8  HGB 9.6* 8.4*  HCT 28.1* 25.1*  PLT 117* 114*   BMET:  Recent Labs  02/14/17 0843  02/14/17 1656 02/14/17 1808 02/15/17 0327  NA 138  < > 137 139 135  K 3.9  < > 4.1 3.7 4.0  CL 103  < > 102  --  104  CO2 28  --   --    --  25  GLUCOSE 97  < > 102* 111* 148*  BUN 19  < > 14  --  12  CREATININE 0.99  < > 0.70  --  0.84  CALCIUM 8.5*  --   --   --  7.8*  < > = values in this interval not displayed.  CMET: Lab Results  Component Value Date   WBC 8.8 02/15/2017   HGB 8.4 (L) 02/15/2017   HCT 25.1 (L) 02/15/2017   PLT 114 (L) 02/15/2017   GLUCOSE 148 (H) 02/15/2017   CHOL 114 02/14/2017   TRIG 51 02/14/2017   HDL 39 (L) 02/14/2017   LDLCALC 65 02/14/2017   NA 135 02/15/2017   K 4.0 02/15/2017   CL 104 02/15/2017   CREATININE 0.84 02/15/2017   BUN 12 02/15/2017   CO2 25 02/15/2017   TSH 1.836 02/14/2017   INR 1.62 02/14/2017   HGBA1C 5.5 02/14/2017      PT/INR:  Recent Labs  02/14/17 1810  LABPROT 19.5*  INR 1.62   Radiology: Dg Chest Port 1 View  Result Date: 02/14/2017 CLINICAL DATA:  Postoperative radiograph, status post CABG. Initial encounter. EXAM: PORTABLE CHEST 1 VIEW COMPARISON:  Chest radiograph performed 02/13/2017 FINDINGS: The patient's endotracheal tube is seen ending 4 cm above the carina. An enteric tube is noted ending overlying the fundus of the stomach. The right IJ Swan-Ganz catheter is noted ending about the pulmonary outflow tract. A left-sided chest tube is noted. No definite pneumothorax is seen. Mild vascular congestion is noted. Underlying bullous change is noted bilaterally. No pleural effusion is identified. The cardiomediastinal silhouette is borderline normal in size. The patient is status post median sternotomy, with evidence of CABG. No acute osseous abnormalities are identified. IMPRESSION: 1. Endotracheal tube seen ending 4 cm above the carina. Remaining tubes and lines as described above. 2. Mild vascular congestion noted.  Lungs remain grossly clear. 3. Underlying bilateral emphysematous bulla noted. Electronically Signed   By: Garald Balding M.D.   On: 02/14/2017 18:44     Assessment/Plan: S/P Procedure(s) (LRB): CORONARY ARTERY BYPASS GRAFTING (CABG) x 2  , using left internal mammary artery and right leg greater saphenous vein harvested endoscopically LIMA-LAD, SVG-RAMUS (N/A) TRANSESOPHAGEAL ECHOCARDIOGRAM (TEE) (N/A) STAPLING OF LARGE LEFT UPPER LOBE PULMONARY BLEB (N/A) ENDOVEIN HARVEST OF RIGHT THIGH GREATER SAPHENOUS VEIN (Right) Mobilize Diuresis Diabetes control d/c tubes/lines Continue foley due to strict I&O and urinary output monitoring See progression orders Expected Acute  Blood - loss Anemia    [name] 02/15/2017 7:10 AM

## 2017-02-15 NOTE — Progress Notes (Signed)
TCTS BRIEF SICU PROGRESS NOTE  1 Day Post-Op  S/P Procedure(s) (LRB): CORONARY ARTERY BYPASS GRAFTING (CABG) x 2 , using left internal mammary artery and right leg greater saphenous vein harvested endoscopically LIMA-LAD, SVG-RAMUS (N/A) TRANSESOPHAGEAL ECHOCARDIOGRAM (TEE) (N/A) STAPLING OF LARGE LEFT UPPER LOBE PULMONARY BLEB (N/A) ENDOVEIN HARVEST OF RIGHT THIGH GREATER SAPHENOUS VEIN (Right)   Stable day NSR w/ stable BP Breathing comfortably w/ O2 sats 97% on 2 L/min Excellent UOP Labs okay  Plan: Continue current plan  Rexene Alberts, MD 02/15/2017 6:04 PM

## 2017-02-15 NOTE — Progress Notes (Signed)
Weaning parameters started at 0245. Patient performed NIF -40 and VC 1.0L. Patient able to lift head off pillow and stick tongue out. Patient extubated at 0335 to 4L nasal cannula. Patient tolerated well. Patient is able to vocalize and is oriented. RT will continue to monitor.

## 2017-02-15 NOTE — Progress Notes (Signed)
Instructed patient with flutter valve. Pt seemed quite weak this morning. Pt did flutter 5 times. No cough afterwards. BBS very diminished. Will cont to work with patient with flutter.

## 2017-02-15 NOTE — Anesthesia Postprocedure Evaluation (Signed)
Anesthesia Post Note  Patient: Theotis Barrio  Procedure(s) Performed: Procedure(s) (LRB): CORONARY ARTERY BYPASS GRAFTING (CABG) x 2 , using left internal mammary artery and right leg greater saphenous vein harvested endoscopically LIMA-LAD, SVG-RAMUS (N/A) TRANSESOPHAGEAL ECHOCARDIOGRAM (TEE) (N/A) STAPLING OF LARGE LEFT UPPER LOBE PULMONARY BLEB (N/A) ENDOVEIN HARVEST OF RIGHT THIGH GREATER SAPHENOUS VEIN (Right)  Patient location during evaluation: SICU Anesthesia Type: General Level of consciousness: patient remains intubated per anesthesia plan Vital Signs Assessment: post-procedure vital signs reviewed and stable Respiratory status: patient remains intubated per anesthesia plan Cardiovascular status: stable Anesthetic complications: no       Last Vitals:  Vitals:   02/15/17 1200 02/15/17 1218  BP:  (!) 109/58  Pulse: 93 87  Resp: 19 11  Temp:      Last Pain:  Vitals:   02/15/17 1218  TempSrc:   PainSc: Asleep                 Effie Janoski

## 2017-02-15 NOTE — Care Management Note (Addendum)
Case Management Note Marvetta Gibbons RN, BSN Unit 2W-Case Manager [phone]  Patient Details  Name: Thomas Trujillo MRN: [id number] Date of Birth: May 16, 1946  Subjective/Objective:  Pt admitted s/p CABGx2 on 02/14/17                   Action/Plan: PTA pt lived at home alone, will need to see if pt has support for discharge-CM will f/u with pt regarding d/c plans as pt progresses.   Expected Discharge Date:                  Expected Discharge Plan:     In-House Referral:     Discharge planning Services  CM Consult  Post Acute Care Choice:    Choice offered to:     DME Arranged:    DME Agency:     HH Arranged:    HH Agency:     Status of Service:  In process, will continue to follow  If discussed at Long Length of Stay Meetings, dates discussed:    Discharge Disposition:   Additional Comments:  02/15/17- 48- Marvetta Gibbons RN, CM- spoke with pt's son- [name]- regarding d/c plans- per conversation- Jamarea states that he works from home and can stay with pt for the first several weeks post discharge- he also has friends/church members that can help him if he needs further assistance. Pt did not use any assist devices prior to admission and still drove.- Plan at this time will be for pt to return home with son coming to stay with him- CM will continue to follow as pt progresses for any further d/c needs.   Dawayne Patricia, RN 02/15/2017, 11:42 AM

## 2017-02-16 ENCOUNTER — Inpatient Hospital Stay (HOSPITAL_COMMUNITY): Payer: Medicare Other

## 2017-02-16 LAB — GLUCOSE, CAPILLARY
Glucose-Capillary: 124 mg/dL — ABNORMAL HIGH (ref 65–99)
Glucose-Capillary: 110 mg/dL — ABNORMAL HIGH (ref 65–99)
Glucose-Capillary: 111 mg/dL — ABNORMAL HIGH (ref 65–99)
Glucose-Capillary: 103 mg/dL — ABNORMAL HIGH (ref 65–99)
Glucose-Capillary: 100 mg/dL — ABNORMAL HIGH (ref 65–99)
Glucose-Capillary: 99 mg/dL (ref 65–99)

## 2017-02-16 LAB — BASIC METABOLIC PANEL
Anion gap: 8 (ref 5–15)
BUN: 15 mg/dL (ref 6–20)
CO2: 28 mmol/L (ref 22–32)
Calcium: 8.1 mg/dL — ABNORMAL LOW (ref 8.9–10.3)
Chloride: 98 mmol/L — ABNORMAL LOW (ref 101–111)
Creatinine, Ser: 1.01 mg/dL (ref 0.61–1.24)
GFR calc Af Amer: >60 mL/min (ref >60)
GFR calc non Af Amer: >60 mL/min (ref >60)
Glucose, Bld: 117 mg/dL — ABNORMAL HIGH (ref 65–99)
Potassium: 3.6 mmol/L (ref 3.5–5.1)
Sodium: 134 mmol/L — ABNORMAL LOW (ref 135–145)

## 2017-02-16 LAB — CBC
HCT: 24.3 % — ABNORMAL LOW (ref 39.0–52.0)
Hemoglobin: 8 g/dL — ABNORMAL LOW (ref 13.0–17.0)
MCH: 30.7 pg (ref 26.0–34.0)
MCHC: 32.9 g/dL (ref 30.0–36.0)
MCV: 93.1 fL (ref 78.0–100.0)
Platelets: 110 10*3/uL — ABNORMAL LOW (ref 150–400)
RBC: 2.61 MIL/uL — ABNORMAL LOW (ref 4.22–5.81)
RDW: 13.8 % (ref 11.5–15.5)
WBC: 10.4 10*3/uL (ref 4.0–10.5)

## 2017-02-16 MED ORDER — FUROSEMIDE 10 MG/ML IJ SOLN
40.0000 mg | Freq: Once | INTRAMUSCULAR | Status: AC
  Administered 2017-02-16: 40 mg via INTRAVENOUS
  Filled 2017-02-16: qty 4

## 2017-02-16 MED ORDER — SODIUM CHLORIDE 0.9 % IV SOLN
30.0000 meq | Freq: Once | INTRAVENOUS | Status: AC
  Administered 2017-02-16: 30 meq via INTRAVENOUS
  Filled 2017-02-16: qty 15

## 2017-02-16 MED ORDER — ATORVASTATIN CALCIUM 20 MG PO TABS
20.0000 mg | ORAL_TABLET | Freq: Every day | ORAL | Status: DC
  Administered 2017-02-16: 20 mg via ORAL
  Filled 2017-02-16: qty 1

## 2017-02-16 MED ORDER — MORPHINE SULFATE (PF) 4 MG/ML IV SOLN: 2.0000 mg | INTRAVENOUS | Status: DC | PRN

## 2017-02-16 MED FILL — Heparin Sodium (Porcine) Inj 1000 Unit/ML: INTRAMUSCULAR | Qty: 30 | Status: AC

## 2017-02-16 MED FILL — Potassium Chloride Inj 2 mEq/ML: INTRAVENOUS | Qty: 40 | Status: AC

## 2017-02-16 MED FILL — Magnesium Sulfate Inj 50%: INTRAMUSCULAR | Qty: 10 | Status: AC

## 2017-02-16 NOTE — Progress Notes (Signed)
Patient ID: Thomas Trujillo, male   DOB: 11-14-1945, 71 y.o.   MRN: [id number] TCTS DAILY ICU PROGRESS NOTE                   [address]            [place] [address]          [phone]   2 Days Post-Op Procedure(s) (LRB): CORONARY ARTERY BYPASS GRAFTING (CABG) x 2 , using left internal mammary artery and right leg greater saphenous vein harvested endoscopically LIMA-LAD, SVG-RAMUS (N/A) TRANSESOPHAGEAL ECHOCARDIOGRAM (TEE) (N/A) STAPLING OF LARGE LEFT UPPER LOBE PULMONARY BLEB (N/A) ENDOVEIN HARVEST OF RIGHT THIGH GREATER SAPHENOUS VEIN (Right)  Total Length of Stay:  LOS: 2 days   Subjective: Alert, nausea improved from last night  Objective: Vital signs in last 24 hours: Temp:  [97.5 F (36.4 C)-99 F (37.2 C)] 99 F (37.2 C) (04/26 0800) Pulse Rate:  [84-98] 84 (04/26 0810) Cardiac Rhythm: Normal sinus rhythm (04/25 2000) Resp:  [10-30] 17 (04/26 0810) BP: (93-125)/(52-70) 94/63 (04/26 0810) SpO2:  [91 %-99 %] 95 % (04/26 0810) Arterial Line BP: (107-120)/(49-55) 113/49 (04/25 1100) Weight:  [165 lb 5.5 oz (75 kg)] 165 lb 5.5 oz (75 kg) (04/26 0500)  Filed Weights   02/14/17 1059 02/15/17 0500 02/16/17 0500  Weight: 139 lb 15.9 oz (63.5 kg) 167 lb 12.3 oz (76.1 kg) 165 lb 5.5 oz (75 kg)    Weight change: 25 lb 5.7 oz (11.5 kg)   Hemodynamic parameters for last 24 hours:    Intake/Output from previous day: 04/25 0701 - 04/26 0700 In: 752.3 [I.V.:602.3; IV Piggyback:150] Out: 2675 [Urine:2305; Chest Tube:370]  Intake/Output this shift: No intake/output data recorded.  Current Meds: Scheduled Meds: . acetaminophen  1,000 mg Oral Q6H   Or  . acetaminophen (TYLENOL) oral liquid 160 mg/5 mL  1,000 mg Per Tube Q6H  . aspirin EC  325 mg Oral Daily   Or  . aspirin  324 mg Per Tube Daily  . atorvastatin  80 mg Oral q1800  . bisacodyl  10 mg Oral Daily   Or  . bisacodyl  10 mg Rectal Daily  . docusate sodium  200 mg Oral Daily  . enoxaparin  (LOVENOX) injection  40 mg Subcutaneous QHS  . insulin aspart  0-24 Units Subcutaneous Q4H  . insulin aspart  0-24 Units Subcutaneous Q4H  . levalbuterol  0.63 mg Nebulization Q6H  . mouth rinse  15 mL Mouth Rinse QID  . mouth rinse  15 mL Mouth Rinse BID  . metoprolol tartrate  12.5 mg Oral BID   Or  . metoprolol tartrate  12.5 mg Per Tube BID  . pantoprazole  40 mg Oral Daily  . sodium chloride flush  3 mL Intravenous Q12H   Continuous Infusions: . sodium chloride Stopped (02/15/17 0830)  . sodium chloride    . sodium chloride Stopped (02/14/17 2300)  . cefUROXime (ZINACEF)  IV Stopped (02/16/17 0033)  . dexmedetomidine (PRECEDEX) IV infusion Stopped (02/14/17 2135)  . lactated ringers    . lactated ringers 20 mL/hr at 02/16/17 0600  . lactated ringers Stopped (02/15/17 0500)  . nitroGLYCERIN Stopped (02/14/17 1843)  . phenylephrine (NEO-SYNEPHRINE) Adult infusion Stopped (02/15/17 0947)  . potassium chloride (KCL MULTIRUN) 30 mEq in 265 mL IVPB 30 mEq (02/16/17 0654)   PRN Meds:.sodium chloride, lactated ringers, metoprolol, midazolam, morphine injection, ondansetron (ZOFRAN) IV, oxyCODONE, sodium chloride flush, traMADol  General appearance: alert and cooperative  Neurologic: intact Heart: regular rate and rhythm, S1, S2 normal, no murmur, click, rub or gallop Lungs: diminished breath sounds bibasilar Abdomen: soft, non-tender; bowel sounds normal; no masses,  no organomegaly Extremities: extremities normal, atraumatic, no cyanosis or edema and Homans sign is negative, no sign of DVT Wound: sternum stable  Lab Results: CBC: Recent Labs  02/15/17 1616 02/15/17 1622 02/16/17 0345  WBC 10.8*  --  10.4  HGB 8.9* 8.5* 8.0*  HCT 26.9* 25.0* 24.3*  PLT 120*  --  110*   BMET:  Recent Labs  02/15/17 0327  02/15/17 1622 02/16/17 0345  NA 135  --  138 134*  K 4.0  --  4.0 3.6  CL 104  --  95* 98*  CO2 25  --   --  28  GLUCOSE 148*  --  123* 117*  BUN 12  --  13 15    CREATININE 0.84  < > 1.00 1.01  CALCIUM 7.8*  --   --  8.1*  < > = values in this interval not displayed.  CMET: Lab Results  Component Value Date   WBC 10.4 02/16/2017   HGB 8.0 (L) 02/16/2017   HCT 24.3 (L) 02/16/2017   PLT 110 (L) 02/16/2017   GLUCOSE 117 (H) 02/16/2017   CHOL 114 02/14/2017   TRIG 51 02/14/2017   HDL 39 (L) 02/14/2017   LDLCALC 65 02/14/2017   NA 134 (L) 02/16/2017   K 3.6 02/16/2017   CL 98 (L) 02/16/2017   CREATININE 1.01 02/16/2017   BUN 15 02/16/2017   CO2 28 02/16/2017   TSH 1.836 02/14/2017   INR 1.62 02/14/2017   HGBA1C 5.5 02/14/2017      PT/INR:  Recent Labs  02/14/17 1810  LABPROT 19.5*  INR 1.62   Radiology: Dg Chest Port 1 View  Result Date: 02/16/2017 CLINICAL DATA:  Chest tube after CABG EXAM: PORTABLE CHEST 1 VIEW COMPARISON:  Yesterday FINDINGS: Swan-Ganz catheter and midline thoracic drain have been removed. There is still left-sided chest tube. No visible pneumothorax. Status post CABG. Stable normal heart size. Advanced emphysema with layering pleural effusions and atelectasis. Interstitial coarsening in the lower lungs. IMPRESSION: 1. Left chest tube without visible pneumothorax. 2. Layering pleural effusions, more apparent today. 3. Lower zone interstitial coarsening, suspect atypical edema due to advanced emphysema. Electronically Signed   By: Monte Fantasia M.D.   On: 02/16/2017 07:49     Assessment/Plan: S/P Procedure(s) (LRB): CORONARY ARTERY BYPASS GRAFTING (CABG) x 2 , using left internal mammary artery and right leg greater saphenous vein harvested endoscopically LIMA-LAD, SVG-RAMUS (N/A) TRANSESOPHAGEAL ECHOCARDIOGRAM (TEE) (N/A) STAPLING OF LARGE LEFT UPPER LOBE PULMONARY BLEB (N/A) ENDOVEIN HARVEST OF RIGHT THIGH GREATER SAPHENOUS VEIN (Right) Mobilize Diuresis Very slow at mobilizing  Nausea improved renal function stable    [name] 02/16/2017 8:51 AM

## 2017-02-16 NOTE — Progress Notes (Signed)
CT surgery p.m. Rounds  Patient examined and record reviewed.Hemodynamics stable,labs satisfactory.Patient had stable day.Continue current care. Thomas Trujillo 02/16/2017

## 2017-02-16 NOTE — Op Note (Deleted)
  The note originally documented on this encounter has been moved the the encounter in which it belongs.  

## 2017-02-16 NOTE — Op Note (Signed)
NAME:  Thomas Trujillo, MALLOZZI NO.:  [id number]  MEDICAL RECORD NO.:  [id number]  LOCATION:                                 FACILITY:  PHYSICIAN:  Lanelle Bal, MD    DATE OF BIRTH:  1946-01-25  DATE OF PROCEDURE:  02/14/2017 DATE OF DISCHARGE:                              OPERATIVE REPORT   PREOPERATIVE DIAGNOSIS:  Acute myocardial infarction with complex left anterior descending high-grade stenosis.  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  PROCEDURE PERFORMED:  Emergency coronary artery bypass grafting x2, with left internal mammary to the left anterior descending coronary artery and reverse saphenous vein graft to the ramus branch with right thigh greater saphenous vein endoscopic harvesting and stapling of pulmonary bleb, left upper lobe.  SURGEON:  Lanelle Bal, MD.  FIRST ASSISTANT:  Nicholes Rough, [place].  BRIEF HISTORY:  The patient is a 71 year old male, who presented to [facility] the evening prior to surgery with prolonged chest pain and elevated troponin to 65.  The morning of surgery, he had underwent cardiac catheterization by Dr. Saunders Revel at [facility], which demonstrated a complex proximal LAD greater than 95% stenosis with patent circumflex and right coronary arteries 60-70% stenosis at the ostium of the moderate size ramus branch.  The patient was referred from [facility] for emergency coronary artery bypass grafting.  Acute angioplasty of the LAD lesion was considered, but felt too high risk considering location.  Risks and options were discussed with the patient and his family.  Because of the emergency nature, preoperative pulmonary function studies were not performed.  The patient did have a portable chest x-ray done preoperatively, but then had emphysematous changes noted.  The patient was a previous  long-term smoker.  He notes that he quit 8 years previously.  Risks and options were discussed with he and his son and the patient agreed and signed informed consent to proceed with emergency bypass surgery.  DESCRIPTION OF PROCEDURE:  Swan-Ganz and arterial line monitors were placed.  The patient underwent general endotracheal anesthesia without incident.  Skin and chest and legs was prepped with Betadine and draped in usual sterile manner.  TEE probe was placed by Dr. Oletta Lamas, which demonstrated hypokinesis in the anterior wall without significant valvular disease.  After appropriate time-out was performed, we proceeded with endo vein harvesting of the right thigh greater saphenous vein.  Single-segment median sternotomy was performed.  As we opened the chest, it became evident that the patient had severe emphysematous changes in his lungs, left probably greater than the right to a greater degree than appreciated on the chest x-ray.  There were multiple large blebs bilaterally.  The largest of these was noted involving the left upper lobe primarily in the lingular area.  The lungs were pushed out of the way enough to dissect down the left internal mammary artery.  This was with some difficulty because of the size of the lungs.  The left internal mammary had good free flow and was hydrostatically dilated with heparinized saline.  The pericardium was opened.  The patient was systemically heparinized.  Ascending aorta was  cannulated.  The right atrium was cannulated.  An aortic root vent cardioplegia needle was introduced into the ascending aorta.  The patient was placed on cardiopulmonary bypass 2.4 L/min/m2.  With the lungs deflated, we were able to staple across the largest of the areas of bleb on the left lung primarily in the lingular area.  The remainder of the emphysematous lung, we did not attempt to staple.  We then proceeded to cool the patient.  Aortic crossclamp was applied.   An 800 mL of cold blood potassium cardioplegia was administered.  Attention first to the ramus branch, which was intramyocardial vessel was opened.  It was a good size vessel, admitted a 1.5 mm probe.  Using a running 7-0 Prolene, distal anastomosis was performed with a second reverse saphenous vein graft.  We then turned our attention to the LAD in the midportion of the LAD.  The vessel was opened, admitted a 1.5 mm probe distally.  Using running 8-0 Prolene, the left internal mammary artery was anastomosed to left anterior descending coronary artery.  As much length on the mammary, it was left as possible to compensate for the hyperinflation of the lungs with removal of bulldog on the mammary artery with rise in myocardial septal temperature.  The bulldog was placed back on the mammary artery with crossclamp still in place.  A single punch aortotomy was performed and the vein graft to the ramus was anastomosed to the ascending aorta.  The heart was allowed to passively fill and de-air.  The bulldog was removed from the mammary artery with rise in myocardial septal temperature.  The crossclamp was then removed with total crossclamp time of 43 minutes.  Sites of anastomosis were inspected free of bleeding.  The patient returned to a sinus rhythm, but with a very long PR interval and was DD paced.  With the body temperature rewarmed to 37 degrees, the patient was then re-ventilated and weaned from cardiopulmonary bypass.  Oxygen saturations remained stable.  The patient was decannulated in usual fashion.  Protamine sulfate was administered with operative field hemostatic.  Atrial and ventricular pacing wires were applied.  Graft marker was applied.  A left pleural tube and a Blake mediastinal drain were left in place. Pericardium was loosely reapproximated.  Sternum was closed with #6 stainless steel wire.  Fascia was closed with interrupted 0 Vicryl and 3- 0 Vicryl subcutaneous tissue,  4-0 subcuticular stitch in skin edges. Dry dressings were applied.  Sponge and needle counts were reported as correct at completion of procedure.  The patient tolerated the procedure without obvious complication.  He was transferred to the Surgical Intensive Care Unit for further postoperative care.  Total pump time was 69 minutes.     Lanelle Bal, MD   ______________________________ Lanelle Bal, MD    EG/MEDQ  D:  02/15/2017  T:  02/15/2017  Job:  [id number]

## 2017-02-16 NOTE — Evaluation (Signed)
Physical Therapy Evaluation Patient Details Name: Thomas Trujillo MRN: [id number] DOB: 01/26/1946 Today's Date: 02/16/2017   History of Present Illness  Patient is a 71 y/o male admitted with NSTEMI now s/p CABG x 2.  PMH positive for cataract excision, nasal polyp surgery.    Clinical Impression  Patient presents with decreased independence with mobility due to deficits listed in PT problem list.  He was previously independent and lived alone.  Currently min A overall for mobility for hallway ambulation.  States son can assist initially at home.  PT to follow acutely and recommend HHPT at d/c.     Follow Up Recommendations Home health PT;Supervision/Assistance - 24 hour    Equipment Recommendations  Rolling walker with 5" wheels    Recommendations for Other Services       Precautions / Restrictions Precautions Precautions: Sternal;Fall Precaution Comments: watch O2 sats      Mobility  Bed Mobility Overal bed mobility: Needs Assistance Bed Mobility: Rolling;Sidelying to Sit Rolling: Min assist Sidelying to sit: Min assist;HOB elevated       General bed mobility comments: cues for technique, assist for stabilizing and bringing trunk all the way upright  Transfers Overall transfer level: Needs assistance Equipment used: Rolling walker (2 wheeled) Transfers: Sit to/from Stand Sit to Stand: Min assist         General transfer comment: cues to hold pillow and use legs, steadying assist  Ambulation/Gait Ambulation/Gait assistance: Min assist;Min guard Ambulation Distance (Feet): 150 Feet Assistive device: Rolling walker (2 wheeled) Gait Pattern/deviations: Step-through pattern;Decreased stride length;Shuffle;Wide base of support     General Gait Details: stopped to rest x 3 for pursed lip breathing due to dropping sats with ambulation   Stairs            Wheelchair Mobility    Modified Rankin (Stroke Patients Only)       Balance Overall balance  assessment: Needs assistance Sitting-balance support: Feet supported Sitting balance-Leahy Scale: Fair     Standing balance support: No upper extremity supported Standing balance-Leahy Scale: Fair Standing balance comment: hugging pillow with sit to stand                             Pertinent Vitals/Pain Pain Assessment: 0-10 Pain Score: 6  Pain Location: chest Pain Descriptors / Indicators: Discomfort;Sore Pain Intervention(s): Monitored during session;Repositioned;Other (comment) (encouraged splinting)    Home Living Family/patient expects to be discharged to:: Private residence Living Arrangements: Alone Available Help at Discharge: Family Type of Home: Apartment Home Access: Level entry     Home Layout: One level Home Equipment: Grab bars - tub/shower Additional Comments: son can stay with him initially    Prior Function Level of Independence: Independent               Hand Dominance   Dominant Hand: Right    Extremity/Trunk Assessment   Upper Extremity Assessment Upper Extremity Assessment: Overall WFL for tasks assessed (with limitations due to precautions)    Lower Extremity Assessment Lower Extremity Assessment: Generalized weakness       Communication   Communication: No difficulties  Cognition Arousal/Alertness: Awake/alert Behavior During Therapy: WFL for tasks assessed/performed Overall Cognitive Status: Within Functional Limits for tasks assessed  General Comments General comments (skin integrity, edema, etc.): Educated in precautions and throughout mobility cues for maintaining    Exercises     Assessment/Plan    PT Assessment Patient needs continued PT services  PT Problem List Decreased strength;Decreased activity tolerance;Decreased balance;Decreased knowledge of use of DME;Pain;Decreased knowledge of precautions;Decreased mobility       PT Treatment  Interventions DME instruction;Gait training;Therapeutic exercise;Patient/family education;Therapeutic activities;Balance training;Functional mobility training    PT Goals (Current goals can be found in the Care Plan section)  Acute Rehab PT Goals Patient Stated Goal: To return home PT Goal Formulation: With patient Time For Goal Achievement: 02/23/17 Potential to Achieve Goals: Good    Frequency Min 3X/week   Barriers to discharge        Co-evaluation               End of Session Equipment Utilized During Treatment: Oxygen Activity Tolerance: Patient limited by fatigue Patient left: in chair;with call bell/phone within reach;with nursing/sitter in room   PT Visit Diagnosis: Muscle weakness (generalized) (M62.81);Unsteadiness on feet (R26.81)    Time: 7342-8768 PT Time Calculation (min) (ACUTE ONLY): 30 min   Charges:   PT Evaluation $PT Eval Moderate Complexity: 1 Procedure PT Treatments $Gait Training: 8-22 mins   PT G CodesMagda Kiel, [place] [phone] 02/16/2017   Reginia Naas 02/16/2017, 12:12 PM

## 2017-02-17 LAB — BASIC METABOLIC PANEL
Anion gap: 8 (ref 5–15)
BUN: 22 mg/dL — ABNORMAL HIGH (ref 6–20)
CO2: 29 mmol/L (ref 22–32)
Calcium: 8.1 mg/dL — ABNORMAL LOW (ref 8.9–10.3)
Chloride: 98 mmol/L — ABNORMAL LOW (ref 101–111)
Creatinine, Ser: 1.06 mg/dL (ref 0.61–1.24)
GFR calc Af Amer: >60 mL/min (ref >60)
GFR calc non Af Amer: >60 mL/min (ref >60)
Glucose, Bld: 82 mg/dL (ref 65–99)
Potassium: 3.8 mmol/L (ref 3.5–5.1)
Sodium: 135 mmol/L (ref 135–145)

## 2017-02-17 LAB — GLUCOSE, CAPILLARY
Glucose-Capillary: 92 mg/dL (ref 65–99)
Glucose-Capillary: 74 mg/dL (ref 65–99)
Glucose-Capillary: 91 mg/dL (ref 65–99)
Glucose-Capillary: 99 mg/dL (ref 65–99)
Glucose-Capillary: 118 mg/dL — ABNORMAL HIGH (ref 65–99)

## 2017-02-17 LAB — CBC
HCT: 24.2 % — ABNORMAL LOW (ref 39.0–52.0)
Hemoglobin: 7.9 g/dL — ABNORMAL LOW (ref 13.0–17.0)
MCH: 30.6 pg (ref 26.0–34.0)
MCHC: 32.6 g/dL (ref 30.0–36.0)
MCV: 93.8 fL (ref 78.0–100.0)
Platelets: 141 10*3/uL — ABNORMAL LOW (ref 150–400)
RBC: 2.58 MIL/uL — ABNORMAL LOW (ref 4.22–5.81)
RDW: 13.8 % (ref 11.5–15.5)
WBC: 9.4 10*3/uL (ref 4.0–10.5)

## 2017-02-17 LAB — BRAIN NATRIURETIC PEPTIDE: B Natriuretic Peptide: 945.8 pg/mL — ABNORMAL HIGH (ref 0.0–100.0)

## 2017-02-17 MED ORDER — MOVING RIGHT ALONG BOOK
Freq: Once | Status: AC
  Administered 2017-02-17: 13:00:00
  Filled 2017-02-17: qty 1

## 2017-02-17 MED ORDER — LEVALBUTEROL HCL 0.63 MG/3ML IN NEBU
0.6300 mg | INHALATION_SOLUTION | Freq: Four times a day (QID) | RESPIRATORY_TRACT | Status: DC
  Administered 2017-02-17 (×2): 0.63 mg via RESPIRATORY_TRACT
  Filled 2017-02-17 (×3): qty 3

## 2017-02-17 MED ORDER — FUROSEMIDE 40 MG PO TABS
40.0000 mg | ORAL_TABLET | Freq: Every day | ORAL | Status: DC
  Administered 2017-02-17 – 2017-02-18 (×2): 40 mg via ORAL
  Filled 2017-02-17 (×2): qty 1

## 2017-02-17 MED ORDER — ORAL CARE MOUTH RINSE
15.0000 mL | Freq: Two times a day (BID) | OROMUCOSAL | Status: DC
  Administered 2017-02-17: 15 mL via OROMUCOSAL

## 2017-02-17 MED ORDER — ENOXAPARIN SODIUM 40 MG/0.4ML ~~LOC~~ SOLN: 40.0000 mg | SUBCUTANEOUS | Status: DC

## 2017-02-17 MED ORDER — ATORVASTATIN CALCIUM 80 MG PO TABS
80.0000 mg | ORAL_TABLET | Freq: Every day | ORAL | Status: DC
  Administered 2017-02-17 – 2017-02-23 (×7): 80 mg via ORAL
  Filled 2017-02-17 (×7): qty 1

## 2017-02-17 MED ORDER — INSULIN ASPART 100 UNIT/ML ~~LOC~~ SOLN: 0.0000 [IU] | Freq: Four times a day (QID) | SUBCUTANEOUS | Status: DC

## 2017-02-17 MED ORDER — PANTOPRAZOLE SODIUM 40 MG PO TBEC
40.0000 mg | DELAYED_RELEASE_TABLET | Freq: Every day | ORAL | Status: DC
  Administered 2017-02-18 – 2017-02-24 (×7): 40 mg via ORAL
  Filled 2017-02-17 (×7): qty 1

## 2017-02-17 MED ORDER — SODIUM CHLORIDE 0.9% FLUSH: 3.0000 mL | INTRAVENOUS | Status: DC | PRN

## 2017-02-17 MED ORDER — INSULIN ASPART 100 UNIT/ML ~~LOC~~ SOLN
0.0000 [IU] | Freq: Three times a day (TID) | SUBCUTANEOUS | Status: DC
  Administered 2017-02-19: 2 [IU] via SUBCUTANEOUS

## 2017-02-17 MED ORDER — TRAMADOL HCL 50 MG PO TABS
50.0000 mg | ORAL_TABLET | ORAL | Status: DC | PRN
  Administered 2017-02-19 – 2017-02-20 (×2): 50 mg via ORAL
  Filled 2017-02-17 (×2): qty 1

## 2017-02-17 MED ORDER — ONDANSETRON HCL 4 MG PO TABS: 4.0000 mg | ORAL_TABLET | Freq: Four times a day (QID) | ORAL | Status: DC | PRN

## 2017-02-17 MED ORDER — OXYCODONE HCL 5 MG PO TABS
5.0000 mg | ORAL_TABLET | ORAL | Status: DC | PRN
  Administered 2017-02-17: 5 mg via ORAL
  Administered 2017-02-18: 10 mg via ORAL
  Administered 2017-02-18 (×2): 5 mg via ORAL
  Filled 2017-02-17 (×2): qty 1
  Filled 2017-02-17: qty 2
  Filled 2017-02-17: qty 1

## 2017-02-17 MED ORDER — DOCUSATE SODIUM 100 MG PO CAPS
200.0000 mg | ORAL_CAPSULE | Freq: Every day | ORAL | Status: DC
  Administered 2017-02-18 – 2017-02-24 (×6): 200 mg via ORAL
  Filled 2017-02-17 (×8): qty 2

## 2017-02-17 MED ORDER — METOPROLOL TARTRATE 12.5 MG HALF TABLET
12.5000 mg | ORAL_TABLET | Freq: Two times a day (BID) | ORAL | Status: DC
  Administered 2017-02-17 – 2017-02-20 (×7): 12.5 mg via ORAL
  Filled 2017-02-17 (×7): qty 1

## 2017-02-17 MED ORDER — ACETAMINOPHEN 325 MG PO TABS: 650.0000 mg | ORAL_TABLET | Freq: Four times a day (QID) | ORAL | Status: DC | PRN

## 2017-02-17 MED ORDER — TAMSULOSIN HCL 0.4 MG PO CAPS
0.4000 mg | ORAL_CAPSULE | Freq: Every day | ORAL | Status: DC
  Administered 2017-02-17 – 2017-02-24 (×8): 0.4 mg via ORAL
  Filled 2017-02-17 (×8): qty 1

## 2017-02-17 MED ORDER — ENOXAPARIN SODIUM 40 MG/0.4ML ~~LOC~~ SOLN
40.0000 mg | SUBCUTANEOUS | Status: DC
  Administered 2017-02-17 – 2017-02-23 (×7): 40 mg via SUBCUTANEOUS
  Filled 2017-02-17 (×7): qty 0.4

## 2017-02-17 MED ORDER — POTASSIUM CHLORIDE CRYS ER 20 MEQ PO TBCR
20.0000 meq | EXTENDED_RELEASE_TABLET | Freq: Every day | ORAL | Status: DC
Start: 2017-02-17 — End: 2017-02-18
  Administered 2017-02-17 – 2017-02-18 (×2): 20 meq via ORAL
  Filled 2017-02-17 (×2): qty 1

## 2017-02-17 MED ORDER — ASPIRIN EC 325 MG PO TBEC
325.0000 mg | DELAYED_RELEASE_TABLET | Freq: Every day | ORAL | Status: DC
  Administered 2017-02-18 – 2017-02-24 (×7): 325 mg via ORAL
  Filled 2017-02-17 (×7): qty 1

## 2017-02-17 MED ORDER — BISACODYL 5 MG PO TBEC
10.0000 mg | DELAYED_RELEASE_TABLET | Freq: Every day | ORAL | Status: DC | PRN
  Administered 2017-02-20: 10 mg via ORAL
  Filled 2017-02-17: qty 2

## 2017-02-17 MED ORDER — SODIUM CHLORIDE 0.9% FLUSH
3.0000 mL | Freq: Two times a day (BID) | INTRAVENOUS | Status: DC
  Administered 2017-02-17 – 2017-02-23 (×12): 3 mL via INTRAVENOUS

## 2017-02-17 MED ORDER — SODIUM CHLORIDE 0.9 % IV SOLN: 250.0000 mL | INTRAVENOUS | Status: DC | PRN

## 2017-02-17 MED ORDER — LEVALBUTEROL HCL 0.63 MG/3ML IN NEBU
0.6300 mg | INHALATION_SOLUTION | Freq: Three times a day (TID) | RESPIRATORY_TRACT | Status: DC
  Administered 2017-02-18 – 2017-02-24 (×21): 0.63 mg via RESPIRATORY_TRACT
  Filled 2017-02-17 (×22): qty 3

## 2017-02-17 MED ORDER — BISACODYL 10 MG RE SUPP: 10.0000 mg | Freq: Every day | RECTAL | Status: DC | PRN

## 2017-02-17 MED ORDER — ONDANSETRON HCL 4 MG/2ML IJ SOLN: 4.0000 mg | Freq: Four times a day (QID) | INTRAMUSCULAR | Status: DC | PRN

## 2017-02-17 NOTE — Progress Notes (Addendum)
Patient ID: Thomas Trujillo, male   DOB: December 05, 1945, 71 y.o.   MRN: [id number] TCTS DAILY ICU PROGRESS NOTE                   [address]            [place] [address]          [phone]   3 Days Post-Op Procedure(s) (LRB): CORONARY ARTERY BYPASS GRAFTING (CABG) x 2 , using left internal mammary artery and right leg greater saphenous vein harvested endoscopically LIMA-LAD, SVG-RAMUS (N/A) TRANSESOPHAGEAL ECHOCARDIOGRAM (TEE) (N/A) STAPLING OF LARGE LEFT UPPER LOBE PULMONARY BLEB (N/A) ENDOVEIN HARVEST OF RIGHT THIGH GREATER SAPHENOUS VEIN (Right)  Total Length of Stay:  LOS: 3 days   Subjective: No issues overnight. He does have some shortness of breath when ambulating with occassionally wheezing.   Objective: Vital signs in last 24 hours: Temp:  [98.1 F (36.7 C)-99 F (37.2 C)] 98.6 F (37 C) (04/27 0351) Pulse Rate:  [82-103] 102 (04/27 0600) Cardiac Rhythm: Normal sinus rhythm (04/26 1900) Resp:  [10-25] 17 (04/27 0600) BP: (90-133)/(45-72) 133/68 (04/27 0600) SpO2:  [86 %-100 %] 96 % (04/27 0600) Weight:  [163 lb 2.3 oz (74 kg)] 163 lb 2.3 oz (74 kg) (04/27 0605)  Filed Weights   02/15/17 0500 02/16/17 0500 02/17/17 0605  Weight: 167 lb 12.3 oz (76.1 kg) 165 lb 5.5 oz (75 kg) 163 lb 2.3 oz (74 kg)    Weight change: -2 lb 3.3 oz (-1 kg)      Intake/Output from previous day: 04/26 0701 - 04/27 0700 In: 555 [P.O.:360; I.V.:145; IV Piggyback:50] Out: 6967 [Urine:1075; Chest Tube:70]  Intake/Output this shift: No intake/output data recorded.  Current Meds: Scheduled Meds: . acetaminophen  1,000 mg Oral Q6H   Or  . acetaminophen (TYLENOL) oral liquid 160 mg/5 mL  1,000 mg Per Tube Q6H  . aspirin EC  325 mg Oral Daily   Or  . aspirin  324 mg Per Tube Daily  . atorvastatin  20 mg Oral q1800  . bisacodyl  10 mg Oral Daily   Or  . bisacodyl  10 mg Rectal Daily  . docusate sodium  200 mg Oral Daily  . enoxaparin (LOVENOX) injection  40 mg  Subcutaneous QHS  . insulin aspart  0-24 Units Subcutaneous Q4H  . insulin aspart  0-24 Units Subcutaneous Q4H  . levalbuterol  0.63 mg Nebulization Q6H  . mouth rinse  15 mL Mouth Rinse QID  . mouth rinse  15 mL Mouth Rinse BID  . metoprolol tartrate  12.5 mg Oral BID   Or  . metoprolol tartrate  12.5 mg Per Tube BID  . pantoprazole  40 mg Oral Daily  . sodium chloride flush  3 mL Intravenous Q12H   Continuous Infusions: . sodium chloride Stopped (02/15/17 0830)  . sodium chloride    . sodium chloride Stopped (02/14/17 2300)  . dexmedetomidine (PRECEDEX) IV infusion Stopped (02/14/17 2135)  . lactated ringers    . lactated ringers 10 mL/hr at 02/16/17 1800  . lactated ringers Stopped (02/15/17 0500)  . nitroGLYCERIN Stopped (02/14/17 1843)  . phenylephrine (NEO-SYNEPHRINE) Adult infusion Stopped (02/15/17 0947)   PRN Meds:.sodium chloride, lactated ringers, metoprolol, midazolam, morphine injection, ondansetron (ZOFRAN) IV, oxyCODONE, sodium chloride flush, traMADol  General appearance: alert, cooperative and no distress Heart: regular rate and rhythm, S1, S2 normal, no murmur, click, rub or gallop Lungs: clear to auscultation bilaterally Abdomen: soft, non-tender; bowel sounds normal;  no masses,  no organomegaly Extremities: extremities normal, atraumatic, no cyanosis or edema Wound: clean and dry  Lab Results: CBC: Recent Labs  02/16/17 0345 02/17/17 0345  WBC 10.4 9.4  HGB 8.0* 7.9*  HCT 24.3* 24.2*  PLT 110* 141*   BMET:  Recent Labs  02/16/17 0345 02/17/17 0345  NA 134* 135  K 3.6 3.8  CL 98* 98*  CO2 28 29  GLUCOSE 117* 82  BUN 15 22*  CREATININE 1.01 1.06  CALCIUM 8.1* 8.1*    CMET: Lab Results  Component Value Date   WBC 9.4 02/17/2017   HGB 7.9 (L) 02/17/2017   HCT 24.2 (L) 02/17/2017   PLT 141 (L) 02/17/2017   GLUCOSE 82 02/17/2017   CHOL 114 02/14/2017   TRIG 51 02/14/2017   HDL 39 (L) 02/14/2017   LDLCALC 65 02/14/2017   NA 135  02/17/2017   K 3.8 02/17/2017   CL 98 (L) 02/17/2017   CREATININE 1.06 02/17/2017   BUN 22 (H) 02/17/2017   CO2 29 02/17/2017   TSH 1.836 02/14/2017   INR 1.62 02/14/2017   HGBA1C 5.5 02/14/2017      PT/INR:  Recent Labs  02/14/17 1810  LABPROT 19.5*  INR 1.62   Radiology: No results found.   Assessment/Plan: S/P Procedure(s) (LRB): CORONARY ARTERY BYPASS GRAFTING (CABG) x 2 , using left internal mammary artery and right leg greater saphenous vein harvested endoscopically LIMA-LAD, SVG-RAMUS (N/A) TRANSESOPHAGEAL ECHOCARDIOGRAM (TEE) (N/A) STAPLING OF LARGE LEFT UPPER LOBE PULMONARY BLEB (N/A) ENDOVEIN HARVEST OF RIGHT THIGH GREATER SAPHENOUS VEIN (Right)  1. CV-NSR in the 58s. BP stable. Continue Lopressor 12.5mg  and Lipitor. No drips. 2. Pulm-tolerating 4L [place] with good oxygen saturation. CXR from yesterday reviewed. Bilateral pleural effusions without pneumothorax.  3. Renal-creatinine stable at 1.06. Making good urine with weight trending down. Not on a diuretic regimen 4. H and H stable. 5. Blood glucose level well controlled 6. On Lovenox for DVT proph 7. Urinary retention with multiple straight caths. I will start him on Flomax.   Plan: Okay to transfer to the step down unit.      [name] 02/17/2017 7:24 AM

## 2017-02-17 NOTE — Progress Notes (Signed)
Patient ID: Thomas Trujillo, male   DOB: 1946/08/04, 71 y.o.   MRN: [id number]  SICU Evening Rounds:  Hemodynamically stable  Good urine output   Awaiting bed on 2W.

## 2017-02-17 NOTE — Progress Notes (Signed)
Called for clarification on transfer orders, per [facility], ok to transfer patient to 2W circle bed not needed.

## 2017-02-17 NOTE — Discharge Summary (Signed)
[address]       [place] [address]             [phone]      Physician Discharge Summary  Patient ID: Thomas Trujillo MRN: [id number] DOB/AGE: 06-16-1946 71 y.o.  Admit date: 02/14/2017 Discharge date: 02/24/2017  Admission Diagnoses: Patient Active Problem List   Diagnosis Date Noted  . NSTEMI (non-ST elevated myocardial infarction) ([place]) 02/13/2017    Active Diagnoses:  1. Tobacco abuse 2. ABL anemia 3. Urinary retention 4. COPD (emphysema)  Discharged Condition: Stable and discharged to home.  HPI:  Patient is  71 yo male  who  presented to [facility] ER 04/23 with non radiating chest pain/pressure at rest, diaphoresis, and exertional dyspnea onset the evening of 04/23. The pain was new . In the ER initial troponin was >65, therefore Cardiology assessed the pt and determined the pt was stable and did not need an urgent cardiac catheterization, plans for cardiac catheterization  04/24.   Troponin I noted to be over 65, in his very minimal at this time, we'll give him fentanyl to see if that gets the pain under control, he is at a 1 or 2 out of 10. Likely had cardiac ischemia over the last few days. EKG again does not show any evidence of STEMI. We'll discuss with cardiology, patient is been admitted to the hospitalist    PMH of Former Smoker (1 PPD quit date 2014).  Hospital Course:  Mr. Elsen underwent a coronary artery bypass grafting 2 with Dr. Servando Snare on 02/15/2017. He tolerated the procedure well and was transferred to the ICU. He was extubated in a timely manner. He did have some nausea postop day 1 which resolved with medication. His renal function remained stable. We began to mobilize the patient. We initiated diuretic regimen for fluid overload.  His chest tubes were discontinued and his Deberah Pelton catheter was also removed.   He was started on Flomax due to urinary retention. His heart rate and blood pressure remained stable on no drips.  He remained in sinus rhythm. 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 His coronary artery bypass grafting surgery was done emergently so there was no pre op ABG on room air or PFTs. He will need to follow up with his medical doctor after discharge. He was felt surgically stable for transfer from the ICU to [facility] for further convalescence on 02/20/2017. He is on 2 liters of oxygen via [place] and we will attempt to wean him to wean him to room air. His glucose has remained well controlled and his HGA1C pre op was 5.5. Accu checks and SS PRN were stopped on 05/01. He did have ABL anemia post op. He did not require a transfusion. His last H and H was 8.3 and 25.5. He was started on oral Ferrous sulfate and folic acid.    Consults: None  Significant Diagnostic Studies:  CLINICAL DATA:  Bypass surgery.  EXAM: CHEST  2 VIEW  COMPARISON:  02/20/2017.  02/19/2017.  02/18/2017.  02/13/2017 .  FINDINGS: Prior CABG. Heart size normal. Interim improvement of bilateral pulmonary infiltrates. Interstitial prominence noted bilaterally most likely related to chronic interstitial lung disease. COPD . Scratched Basal pleural-parenchymal thickening noted most likely scarring. Small bilateral pleural effusions. No pneumothorax.  IMPRESSION: 1.  Interim improvement of bilateral pulmonary infiltrates .  2. COPD. Interstitial prominence noted  most likely related to chronic interstitial lung disease. Bilateral pleural-parenchymal scarring. Small bilateral pleural effusions.   Electronically Signed   By: Marcello Moores  Register   On: 02/21/2017 08:00    Treatments:   NAME:  HILLERY, BHALLA NO.:  [id number]  MEDICAL RECORD NO.:  [id number]  LOCATION:                                 FACILITY:  PHYSICIAN:  Lanelle Bal, MD    DATE OF BIRTH:  [birth year]  DATE OF PROCEDURE:   02/14/2017 DATE OF DISCHARGE:                              OPERATIVE REPORT   PREOPERATIVE DIAGNOSIS:  Acute myocardial infarction with complex left anterior descending high-grade stenosis.  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  PROCEDURE PERFORMED:  Emergency coronary artery bypass grafting x2, with left internal mammary to the left anterior descending coronary artery and reverse saphenous vein graft to the ramus branch with right thigh greater saphenous vein endoscopic harvesting and stapling of pulmonary bleb, left upper lobe.  SURGEON:  Lanelle Bal, MD.  FIRST ASSISTANT:  Nicholes Rough, [place].   Discharge Exam: Blood pressure 106/62, pulse 94, temperature 98.5 F (36.9 C), temperature source Oral, resp. rate 18, height 5\' 5"  (1.651 m), weight 68.4 kg (150 lb 11.2 oz), SpO2 98 %.   Cardiovascular: RRR Pulmonary: Slightly diminished at bases. Abdomen: Soft, non tender, bowel sounds present. Extremities: Bilateral lower extremity edema. Wounds: Clean and dry.  No erythema or signs of infection.   Disposition: 82-DC/txfr to short term hosp for IP care with planned acute care IP readmission  Discharge Instructions    Discharge patient    Complete by:  As directed    Home with home health, PT/OT, and social work   Discharge disposition:  01-Home or Self Care   Discharge patient date:  02/24/2017     Allergies as of 02/24/2017      Reactions   No Known Allergies       Medication List    TAKE these medications   acetaminophen 500 MG tablet Commonly known as:  TYLENOL Take 500 mg by mouth every 6 (six) hours as needed.   aspirin 325 MG EC tablet Take 1 tablet (325 mg total) by mouth daily. Start taking on:  02/25/2017   aspirin-sod bicarb-citric acid 325 MG Tbef tablet Commonly known as:  ALKA-SELTZER Take 325 mg by mouth every 6  (six) hours as needed.   atorvastatin 80 MG tablet Commonly known as:  LIPITOR Take 1 tablet (80 mg total) by mouth daily at 6 PM.   furosemide 40 MG tablet Commonly known as:  LASIX Take 1 tablet (40 mg total) by mouth daily. Start taking on:  02/25/2017   Ipratropium-Albuterol 20-100 MCG/ACT Aers respimat Commonly known as:  COMBIVENT Inhale 1 puff into the lungs every 6 (six) hours as needed for wheezing.   metoprolol tartrate 25 MG tablet Commonly known as:  LOPRESSOR Take 1 tablet (25 mg total) by mouth 2 (two) times daily.   oxyCODONE 5 MG immediate release tablet Commonly known as:  Oxy IR/ROXICODONE Take 1 tablet (5 mg total) by mouth every 4 (four)  hours as needed for severe pain.   potassium chloride SA 20 MEQ tablet Commonly known as:  K-DUR,KLOR-CON Take 1 tablet (20 mEq total) by mouth daily. Start taking on:  02/25/2017   tamsulosin 0.4 MG Caps capsule Commonly known as:  FLOMAX Take 1 capsule (0.4 mg total) by mouth daily. Start taking on:  02/25/2017            Durable Medical Equipment        Start     Ordered   02/24/17 1429  For home use only DME oxygen  Once    Question Answer Comment  Mode or (Route) Nasal cannula   Liters per Minute 2   Frequency Continuous (stationary and portable oxygen unit needed)   Oxygen delivery system Gas      02/24/17 1428    The patient has been discharged on:   1.Beta Blocker:  Yes [  x ]                              No   [   ]                              If No, reason:  2.Ace Inhibitor/ARB: Yes [   ]                                     No  [  x  ]                                     If No, reason: Labile BP  3.Statin:   Yes [ x  ]                  No  [   ]                  If No, reason:  4.Ecasa:  Yes  [ x  ]                  No   [   ]                  If No, reason:  Follow-up Information    FELDPAUSCH, DALE E, MD. Call in 1 day(s).   Specialty:  Family Medicine Why:  Call for a follow up  appointment regarding further management of COPD (emphysema) Contact information: [address] [place] [address] [phone]        [name], MD. Call in 1 day(s).   Specialty:  Cardiothoracic Surgery Why:  Please arrive at your appointment on 03/27/2017 at 1:30pm. Please arrive at 1:00pm for your chest xray at [facility] located on the first floor of our building.  Contact information: [address] [place] [address] [phone]        [name], MD Follow up on 03/01/2017.   Specialty:  Cardiology Why:  Appointment time is at 10:40 am Contact information: [place] [address] [phone]              Signed: Elgie Collard PA-C 02/24/2017, 2:35 PM

## 2017-02-18 ENCOUNTER — Inpatient Hospital Stay (HOSPITAL_COMMUNITY): Payer: Medicare Other

## 2017-02-18 LAB — BPAM RBC
Blood Product Expiration Date: 201805182359
Blood Product Expiration Date: 201805192359
Blood Product Expiration Date: 201805202359
Blood Product Expiration Date: 201805242359
ISSUE DATE / TIME: 201804241330
ISSUE DATE / TIME: 201804241330
Unit Type and Rh: 7300
Unit Type and Rh: 7300
Unit Type and Rh: 7300
Unit Type and Rh: 7300

## 2017-02-18 LAB — CBC
HEMATOCRIT: 26.5 % — AB (ref 39.0–52.0)
Hemoglobin: 8.7 g/dL — ABNORMAL LOW (ref 13.0–17.0)
MCH: 30.4 pg (ref 26.0–34.0)
MCHC: 32.8 g/dL (ref 30.0–36.0)
MCV: 92.7 fL (ref 78.0–100.0)
PLATELETS: 189 10*3/uL (ref 150–400)
RBC: 2.86 MIL/uL — ABNORMAL LOW (ref 4.22–5.81)
RDW: 13.6 % (ref 11.5–15.5)
WBC: 9.1 10*3/uL (ref 4.0–10.5)

## 2017-02-18 LAB — BASIC METABOLIC PANEL
Anion gap: 7 (ref 5–15)
BUN: 20 mg/dL (ref 6–20)
CALCIUM: 8 mg/dL — AB (ref 8.9–10.3)
CO2: 30 mmol/L (ref 22–32)
CREATININE: 0.94 mg/dL (ref 0.61–1.24)
Chloride: 95 mmol/L — ABNORMAL LOW (ref 101–111)
Glucose, Bld: 100 mg/dL — ABNORMAL HIGH (ref 65–99)
Potassium: 3.7 mmol/L (ref 3.5–5.1)
SODIUM: 132 mmol/L — AB (ref 135–145)

## 2017-02-18 LAB — TYPE AND SCREEN
ABO/RH(D): B POS
Antibody Screen: NEGATIVE
Unit division: 0
Unit division: 0
Unit division: 0
Unit division: 0

## 2017-02-18 LAB — POCT I-STAT 3, ART BLOOD GAS (G3+)
Acid-Base Excess: 5 mmol/L — ABNORMAL HIGH (ref 0.0–2.0)
Bicarbonate: 30.5 mmol/L — ABNORMAL HIGH (ref 20.0–28.0)
O2 Saturation: 98 %
Patient temperature: 98.6
TCO2: 32 mmol/L (ref 0–100)
pCO2 arterial: 50.1 mmHg — ABNORMAL HIGH (ref 32.0–48.0)
pH, Arterial: 7.393 (ref 7.350–7.450)
pO2, Arterial: 103 mmHg (ref 83.0–108.0)

## 2017-02-18 LAB — POCT I-STAT, CHEM 8
BUN: 25 mg/dL — ABNORMAL HIGH (ref 6–20)
Calcium, Ion: 1.12 mmol/L — ABNORMAL LOW (ref 1.15–1.40)
Chloride: 92 mmol/L — ABNORMAL LOW (ref 101–111)
Creatinine, Ser: 0.9 mg/dL (ref 0.61–1.24)
Glucose, Bld: 100 mg/dL — ABNORMAL HIGH (ref 65–99)
HCT: 27 % — ABNORMAL LOW (ref 39.0–52.0)
Hemoglobin: 9.2 g/dL — ABNORMAL LOW (ref 13.0–17.0)
Potassium: 3.8 mmol/L (ref 3.5–5.1)
Sodium: 133 mmol/L — ABNORMAL LOW (ref 135–145)
TCO2: 30 mmol/L (ref 0–100)

## 2017-02-18 LAB — GLUCOSE, CAPILLARY: Glucose-Capillary: 116 mg/dL — ABNORMAL HIGH (ref 65–99)

## 2017-02-18 MED ORDER — PNEUMOCOCCAL VAC POLYVALENT 25 MCG/0.5ML IJ INJ: 0.5000 mL | INJECTION | INTRAMUSCULAR | Status: DC | PRN

## 2017-02-18 MED ORDER — FUROSEMIDE 10 MG/ML IJ SOLN
40.0000 mg | Freq: Once | INTRAMUSCULAR | Status: AC
  Administered 2017-02-18: 40 mg via INTRAVENOUS
  Filled 2017-02-18: qty 4

## 2017-02-18 MED ORDER — POTASSIUM CHLORIDE CRYS ER 20 MEQ PO TBCR
40.0000 meq | EXTENDED_RELEASE_TABLET | Freq: Once | ORAL | Status: AC
  Administered 2017-02-18: 40 meq via ORAL
  Filled 2017-02-18: qty 2

## 2017-02-18 MED ORDER — POTASSIUM CHLORIDE CRYS ER 20 MEQ PO TBCR
30.0000 meq | EXTENDED_RELEASE_TABLET | Freq: Two times a day (BID) | ORAL | Status: AC
  Administered 2017-02-18 – 2017-02-19 (×4): 30 meq via ORAL
  Filled 2017-02-18 (×3): qty 1

## 2017-02-18 MED ORDER — FUROSEMIDE 10 MG/ML IJ SOLN
40.0000 mg | Freq: Two times a day (BID) | INTRAMUSCULAR | Status: DC
  Filled 2017-02-18: qty 4

## 2017-02-18 NOTE — Progress Notes (Signed)
Paged Bartle for increased work of breathing. Orders given for lasix

## 2017-02-18 NOTE — Progress Notes (Signed)
Patient ID: Thomas Trujillo, male   DOB: 12-09-1945, 71 y.o.   MRN: [id number]  Hemodynamically stable  sats 99%  Diuresing well  Ambulated today

## 2017-02-18 NOTE — Progress Notes (Signed)
4 Days Post-Op Procedure(s) (LRB): CORONARY ARTERY BYPASS GRAFTING (CABG) x 2 , using left internal mammary artery and right leg greater saphenous vein harvested endoscopically LIMA-LAD, SVG-RAMUS (N/A) TRANSESOPHAGEAL ECHOCARDIOGRAM (TEE) (N/A) STAPLING OF LARGE LEFT UPPER LOBE PULMONARY BLEB (N/A) ENDOVEIN HARVEST OF RIGHT THIGH GREATER SAPHENOUS VEIN (Right) Subjective:  Had wheezing and shortness of breath overnight improved with lasix.  Has not been doing IS much and refused to get up to chair this am.  Objective: Vital signs in last 24 hours: Temp:  [98.4 F (36.9 C)-99.5 F (37.5 C)] 98.4 F (36.9 C) (04/28 0822) Pulse Rate:  [25-111] 108 (04/28 1000) Cardiac Rhythm: Sinus tachycardia (04/28 0800) Resp:  [13-30] 27 (04/28 1000) BP: (89-132)/(53-75) 122/72 (04/28 1000) SpO2:  [91 %-100 %] 94 % (04/28 1000) Weight:  [74.6 kg (164 lb 7.4 oz)] 74.6 kg (164 lb 7.4 oz) (04/28 0500)  Hemodynamic parameters for last 24 hours:    Intake/Output from previous day: 04/27 0701 - 04/28 0700 In: 600 [P.O.:600] Out: 2900 [Urine:2900] Intake/Output this shift: Total I/O In: 240 [P.O.:240] Out: 450 [Urine:450]  General appearance: alert Neurologic: intact Heart: regular rate and rhythm, S1, S2 normal, no murmur, click, rub or gallop Lungs: clear to auscultation bilaterally Extremities: edema moderate Wound: incision ok  Lab Results:  Recent Labs  02/17/17 0345 02/18/17 0453 02/18/17 0503  WBC 9.4 9.1  --   HGB 7.9* 8.7* 9.2*  HCT 24.2* 26.5* 27.0*  PLT 141* 189  --    BMET:  Recent Labs  02/17/17 0345 02/18/17 0453 02/18/17 0503  NA 135 132* 133*  K 3.8 3.7 3.8  CL 98* 95* 92*  CO2 29 30  --   GLUCOSE 82 100* 100*  BUN 22* 20 25*  CREATININE 1.06 0.94 0.90  CALCIUM 8.1* 8.0*  --     PT/INR: No results for input(s): LABPROT, INR in the last 72 hours. ABG    Component Value Date/Time   PHART 7.393 02/18/2017 0422   HCO3 30.5 (H) 02/18/2017 0422   TCO2 30  02/18/2017 0503   ACIDBASEDEF 2.0 02/15/2017 0445   O2SAT 98.0 02/18/2017 0422   CBG (last 3)   Recent Labs  02/17/17 1142 02/17/17 1609 02/17/17 2012  GLUCAP 91 99 118*   CLINICAL DATA:  Worsening dyspnea.  EXAM: PORTABLE CHEST 1 VIEW  COMPARISON:  Chest radiograph February 16, 2017  FINDINGS: Interval removal of LEFT chest tube with small LEFT lung base pneumothorax. Cardiomediastinal silhouette is normal, status post median sternotomy. Mildly calcified aortic knob. Similar patchy bibasilar airspace opacities. Increased lung volumes. Blunting of the RIGHT costophrenic angle. Soft tissue planes and included osseous structures are nonsuspicious.  IMPRESSION: Interval removal of LEFT chest tube with small LEFT pneumothorax.  Similar bibasilar airspace opacities and small RIGHT pleural effusion.  COPD.  These results will be called to the ordering clinician or representative by the Radiologist Assistant, and communication documented in the z[facility]ision Dashboard.   Electronically Signed   By: Elon Alas M.D.   On: 02/18/2017 05:05  Assessment/Plan: S/P Procedure(s) (LRB): CORONARY ARTERY BYPASS GRAFTING (CABG) x 2 , using left internal mammary artery and right leg greater saphenous vein harvested endoscopically LIMA-LAD, SVG-RAMUS (N/A) TRANSESOPHAGEAL ECHOCARDIOGRAM (TEE) (N/A) STAPLING OF LARGE LEFT UPPER LOBE PULMONARY BLEB (N/A) ENDOVEIN HARVEST OF RIGHT THIGH GREATER SAPHENOUS VEIN (Right)  He is hemodynamically stable in sinus rhythm.  Volume excess: wt is 25 lbs over preop. Continue diuresis and replace K+  IS and ambulation.   LOS:  4 days    Gaye Pollack 02/18/2017

## 2017-02-19 ENCOUNTER — Inpatient Hospital Stay (HOSPITAL_COMMUNITY): Payer: Medicare Other

## 2017-02-19 LAB — GLUCOSE, CAPILLARY
Glucose-Capillary: 106 mg/dL — ABNORMAL HIGH (ref 65–99)
Glucose-Capillary: 154 mg/dL — ABNORMAL HIGH (ref 65–99)
Glucose-Capillary: 107 mg/dL — ABNORMAL HIGH (ref 65–99)

## 2017-02-19 LAB — BASIC METABOLIC PANEL
Anion gap: 10 (ref 5–15)
BUN: 24 mg/dL — AB (ref 6–20)
CHLORIDE: 89 mmol/L — AB (ref 101–111)
CO2: 35 mmol/L — ABNORMAL HIGH (ref 22–32)
CREATININE: 1.19 mg/dL (ref 0.61–1.24)
Calcium: 8.1 mg/dL — ABNORMAL LOW (ref 8.9–10.3)
GFR calc Af Amer: >60 mL/min (ref >60)
GFR calc non Af Amer: >60 mL/min (ref >60)
GLUCOSE: 117 mg/dL — AB (ref 65–99)
Potassium: 3.5 mmol/L (ref 3.5–5.1)
Sodium: 134 mmol/L — ABNORMAL LOW (ref 135–145)

## 2017-02-19 MED ORDER — SODIUM CHLORIDE 0.9 % IV SOLN
30.0000 meq | Freq: Once | INTRAVENOUS | Status: DC
  Filled 2017-02-19: qty 15

## 2017-02-19 NOTE — Progress Notes (Signed)
5 Days Post-Op Procedure(s) (LRB): CORONARY ARTERY BYPASS GRAFTING (CABG) x 2 , using left internal mammary artery and right leg greater saphenous vein harvested endoscopically LIMA-LAD, SVG-RAMUS (N/A) TRANSESOPHAGEAL ECHOCARDIOGRAM (TEE) (N/A) STAPLING OF LARGE LEFT UPPER LOBE PULMONARY BLEB (N/A) ENDOVEIN HARVEST OF RIGHT THIGH GREATER SAPHENOUS VEIN (Right) Subjective: Only complaint is of pain over tailbone with sitting in chair with feet up. Better if feet are down.  Ambulated around ICU on 6L HFNC.  Objective: Vital signs in last 24 hours: Temp:  [97.9 F (36.6 C)-98.4 F (36.9 C)] 98 F (36.7 C) (04/29 0700) Pulse Rate:  [93-121] 103 (04/29 0922) Cardiac Rhythm: Normal sinus rhythm (04/29 0800) Resp:  [11-33] 11 (04/29 0922) BP: (71-136)/(46-67) 118/57 (04/29 0922) SpO2:  [95 %-100 %] 96 % (04/29 0922) Weight:  [69.7 kg (153 lb 10.6 oz)] 69.7 kg (153 lb 10.6 oz) (04/29 0500)  Hemodynamic parameters for last 24 hours:    Intake/Output from previous day: 04/28 0701 - 04/29 0700 In: 750 [P.O.:750] Out: 2280 [Urine:2280] Intake/Output this shift: Total I/O In: 240 [P.O.:240] Out: -   General appearance: alert and cooperative Neurologic: intact Heart: regular rate and rhythm, S1, S2 normal, no murmur, click, rub or gallop Lungs: diminished breath sounds bibasilar Extremities: extremities normal, atraumatic, no cyanosis or edema Wound: incisions ok  Lab Results:  Recent Labs  02/17/17 0345 02/18/17 0453 02/18/17 0503  WBC 9.4 9.1  --   HGB 7.9* 8.7* 9.2*  HCT 24.2* 26.5* 27.0*  PLT 141* 189  --    BMET:  Recent Labs  02/18/17 0453 02/18/17 0503 02/19/17 0301  NA 132* 133* 134*  K 3.7 3.8 3.5  CL 95* 92* 89*  CO2 30  --  35*  GLUCOSE 100* 100* 117*  BUN 20 25* 24*  CREATININE 0.94 0.90 1.19  CALCIUM 8.0*  --  8.1*    PT/INR: No results for input(s): LABPROT, INR in the last 72 hours. ABG    Component Value Date/Time   PHART 7.393 02/18/2017  0422   HCO3 30.5 (H) 02/18/2017 0422   TCO2 30 02/18/2017 0503   ACIDBASEDEF 2.0 02/15/2017 0445   O2SAT 98.0 02/18/2017 0422   CBG (last 3)   Recent Labs  02/17/17 2012 02/18/17 2147 02/19/17 0823  GLUCAP 118* 116* 106*    Assessment/Plan: S/P Procedure(s) (LRB): CORONARY ARTERY BYPASS GRAFTING (CABG) x 2 , using left internal mammary artery and right leg greater saphenous vein harvested endoscopically LIMA-LAD, SVG-RAMUS (N/A) TRANSESOPHAGEAL ECHOCARDIOGRAM (TEE) (N/A) STAPLING OF LARGE LEFT UPPER LOBE PULMONARY BLEB (N/A) ENDOVEIN HARVEST OF RIGHT THIGH GREATER SAPHENOUS VEIN (Right)  POD 4 Emergent CABG.   He is hemodynamically stable but orthostatic this am when he was up. He diuresed well yesterday and wt is down 9 lbs from yesterday. Not sure what baseline is but he has no significant edema so may be getting a little dry. Will hold further diuresis.  He has significant COPD with very emphysematous lungs noted at time of surgery and on CXR and he had bleb resection at time of surgery. He was emergent so we don't have preop room air ABG and PFT's. He quit smoking 8 yrs ago. CXR still shows bibasilar atelectasis. Continue IS, flutter valve, bronchodilator.  EF at surgery was 35-40% in setting of acute anterior STEMI. Continue low dose BB. Plan ACE I later if BP rises. He will not tolerate it at this time.   LOS: 5 days    Gaye Pollack 02/19/2017

## 2017-02-19 NOTE — Progress Notes (Signed)
Patient ID: Thomas Trujillo, male   DOB: 02/21/1946, 71 y.o.   MRN: [id number] SICU Evening Rounds:  Hemodynamically stable in sinus rhythm. Has some orthostasis with standing but has been able to walk today. Urine output good. I/O even today.

## 2017-02-19 NOTE — Progress Notes (Signed)
Ambulated patient approximately 100 ft till patient had to sit down. Patient c/o dizziness before and after. VSS. Will continue to monitor closely. Up in recliner at this time. Decreased oxygen down from HF 8L to HF 6l/[place] with sats at 98 %

## 2017-02-20 ENCOUNTER — Inpatient Hospital Stay (HOSPITAL_COMMUNITY): Payer: Medicare Other

## 2017-02-20 LAB — BASIC METABOLIC PANEL
Anion gap: 7 (ref 5–15)
BUN: 26 mg/dL — ABNORMAL HIGH (ref 6–20)
CALCIUM: 8.4 mg/dL — AB (ref 8.9–10.3)
CHLORIDE: 94 mmol/L — AB (ref 101–111)
CO2: 33 mmol/L — ABNORMAL HIGH (ref 22–32)
CREATININE: 1 mg/dL (ref 0.61–1.24)
GFR calc non Af Amer: >60 mL/min (ref >60)
Glucose, Bld: 118 mg/dL — ABNORMAL HIGH (ref 65–99)
Potassium: 3.8 mmol/L (ref 3.5–5.1)
SODIUM: 134 mmol/L — AB (ref 135–145)

## 2017-02-20 LAB — GLUCOSE, CAPILLARY
Glucose-Capillary: 108 mg/dL — ABNORMAL HIGH (ref 65–99)
Glucose-Capillary: 107 mg/dL — ABNORMAL HIGH (ref 65–99)
Glucose-Capillary: 102 mg/dL — ABNORMAL HIGH (ref 65–99)
Glucose-Capillary: 114 mg/dL — ABNORMAL HIGH (ref 65–99)
Glucose-Capillary: 110 mg/dL — ABNORMAL HIGH (ref 65–99)

## 2017-02-20 LAB — CBC
HCT: 26 % — ABNORMAL LOW (ref 39.0–52.0)
Hemoglobin: 8.7 g/dL — ABNORMAL LOW (ref 13.0–17.0)
MCH: 31 pg (ref 26.0–34.0)
MCHC: 33.5 g/dL (ref 30.0–36.0)
MCV: 92.5 fL (ref 78.0–100.0)
PLATELETS: 219 10*3/uL (ref 150–400)
RBC: 2.81 MIL/uL — AB (ref 4.22–5.81)
RDW: 13.7 % (ref 11.5–15.5)
WBC: 8 10*3/uL (ref 4.0–10.5)

## 2017-02-20 MED ORDER — ETOMIDATE 2 MG/ML IV SOLN
INTRAVENOUS | Status: AC
  Filled 2017-02-20: qty 10

## 2017-02-20 MED ORDER — BOOST / RESOURCE BREEZE PO LIQD
1.0000 | Freq: Three times a day (TID) | ORAL | Status: DC
  Administered 2017-02-20 – 2017-02-23 (×8): 1 via ORAL

## 2017-02-20 MED ORDER — TRAMADOL HCL 50 MG PO TABS: 50.0000 mg | ORAL_TABLET | ORAL | Status: DC | PRN

## 2017-02-20 MED ORDER — OXYCODONE HCL 5 MG PO TABS
5.0000 mg | ORAL_TABLET | ORAL | Status: DC | PRN
  Administered 2017-02-21 – 2017-02-24 (×5): 5 mg via ORAL
  Filled 2017-02-20 (×5): qty 1

## 2017-02-20 NOTE — Progress Notes (Signed)
CARDIAC REHAB PHASE I   PRE:  Rate/Rhythm: 98 SR  BP:  Supine: 108/57  Sitting:   Standing:    SaO2: 98% 3.5L HFNC  MODE:  Ambulation: 150 ft   POST:  Rate/Rhythm: 112 ST  BP:  Supine:   Sitting: 127/59  Standing:    SaO2: 97% 4L HFNC      To 4L [place] 1311-1342 Pt walked 150 ft on 4LHFNC with gait belt use, rolling walker and asst x 2. Followed with rollator in case pt needed to sit. Pt took two standing rest breaks but did not need to sit. To recliner after walk. Discussed with RN and put pt on 4L regular [place] since sats so good. Will continue to wean. Call bell in reach.   Graylon Good, RN BSN  02/20/2017 1:37 PM

## 2017-02-20 NOTE — Progress Notes (Signed)
PT Cancellation Note  Patient Details Name: Thomas Trujillo MRN: [id number] DOB: [birth year]   Cancelled Treatment:    Reason Eval/Treat Not Completed: Other (comment) (pt just walked with cardiac rehab, will attempt next date)   [name] [name] 02/20/2017, 1:45 PM  Elwyn Reach, [place]

## 2017-02-20 NOTE — Progress Notes (Signed)
Patient ID: Thomas Trujillo, male   DOB: September 20, 1946, 71 y.o.   MRN: [id number] TCTS DAILY ICU PROGRESS NOTE                   [address]            [place] [address]          [phone]   6 Days Post-Op Procedure(s) (LRB): CORONARY ARTERY BYPASS GRAFTING (CABG) x 2 , using left internal mammary artery and right leg greater saphenous vein harvested endoscopically LIMA-LAD, SVG-RAMUS (N/A) TRANSESOPHAGEAL ECHOCARDIOGRAM (TEE) (N/A) STAPLING OF LARGE LEFT UPPER LOBE PULMONARY BLEB (N/A) ENDOVEIN HARVEST OF RIGHT THIGH GREATER SAPHENOUS VEIN (Right)  Total Length of Stay:  LOS: 6 days   Subjective: Awake and alert, very slow at walking but got 1/2 way around unit ,   Objective: Vital signs in last 24 hours: Temp:  [97.8 F (36.6 C)-98.9 F (37.2 C)] 97.9 F (36.6 C) (04/30 0813) Pulse Rate:  [89-113] 89 (04/30 0800) Cardiac Rhythm: Normal sinus rhythm (04/30 0800) Resp:  [11-35] 28 (04/30 0800) BP: (71-130)/(42-95) 92/53 (04/30 0800) SpO2:  [95 %-100 %] 98 % (04/30 0800) Weight:  [155 lb 3.2 oz (70.4 kg)] 155 lb 3.2 oz (70.4 kg) (04/30 0500)  Filed Weights   02/18/17 0500 02/19/17 0500 02/20/17 0500  Weight: 164 lb 7.4 oz (74.6 kg) 153 lb 10.6 oz (69.7 kg) 155 lb 3.2 oz (70.4 kg)    Weight change: 1 lb 8.6 oz (0.698 kg)   Hemodynamic parameters for last 24 hours:    Intake/Output from previous day: 04/29 0701 - 04/30 0700 In: 1023 [P.O.:1020; I.V.:3] Out: 1375 [Urine:1375]  Intake/Output this shift: No intake/output data recorded.  Current Meds: Scheduled Meds: . aspirin EC  325 mg Oral Daily  . atorvastatin  80 mg Oral q1800  . docusate sodium  200 mg Oral Daily  . enoxaparin (LOVENOX) injection  40 mg Subcutaneous Q24H  . insulin aspart  0-24 Units Subcutaneous TID WC & HS  . levalbuterol  0.63 mg Nebulization TID  . metoprolol tartrate  12.5 mg Oral BID  . pantoprazole  40 mg Oral QAC breakfast  . sodium chloride flush  3 mL Intravenous  Q12H  . tamsulosin  0.4 mg Oral Daily   Continuous Infusions: . sodium chloride     PRN Meds:.sodium chloride, acetaminophen, bisacodyl **OR** bisacodyl, ondansetron **OR** ondansetron (ZOFRAN) IV, oxyCODONE, pneumococcal 23 valent vaccine, sodium chloride flush, traMADol  General appearance: alert and cooperative Neurologic: intact Heart: regular rate and rhythm, S1, S2 normal, no murmur, click, rub or gallop Lungs: diminished breath sounds bibasilar Abdomen: soft, non-tender; bowel sounds normal; no masses,  no organomegaly Extremities: extremities normal, atraumatic, no cyanosis or edema and Homans sign is negative, no sign of DVT Wound: sternum intact  Lab Results: CBC: Recent Labs  02/18/17 0453 02/18/17 0503 02/20/17 0338  WBC 9.1  --  8.0  HGB 8.7* 9.2* 8.7*  HCT 26.5* 27.0* 26.0*  PLT 189  --  219   BMET:  Recent Labs  02/19/17 0301 02/20/17 0338  NA 134* 134*  K 3.5 3.8  CL 89* 94*  CO2 35* 33*  GLUCOSE 117* 118*  BUN 24* 26*  CREATININE 1.19 1.00  CALCIUM 8.1* 8.4*    CMET: Lab Results  Component Value Date   WBC 8.0 02/20/2017   HGB 8.7 (L) 02/20/2017   HCT 26.0 (L) 02/20/2017   PLT 219 02/20/2017   GLUCOSE 118 (H)  02/20/2017   CHOL 114 02/14/2017   TRIG 51 02/14/2017   HDL 39 (L) 02/14/2017   LDLCALC 65 02/14/2017   NA 134 (L) 02/20/2017   K 3.8 02/20/2017   CL 94 (L) 02/20/2017   CREATININE 1.00 02/20/2017   BUN 26 (H) 02/20/2017   CO2 33 (H) 02/20/2017   TSH 1.836 02/14/2017   INR 1.62 02/14/2017   HGBA1C 5.5 02/14/2017      PT/INR: No results for input(s): LABPROT, INR in the last 72 hours. Radiology: Dg Chest Port 1 View  Result Date: 02/20/2017 CLINICAL DATA:  Shortness of breath and wheezing.  History of CABG. EXAM: PORTABLE CHEST 1 VIEW COMPARISON:  Chest radiograph February 19, 2017 FINDINGS: Cardiomediastinal silhouette is normal, status post median sternotomy for CABG. Severe bullous changes with chronic interstitial changes  increased lung volumes. Small pleural effusions and bibasilar airspace opacities. No pneumothorax. Osteopenia. Soft tissue planes are nonsuspicious. IMPRESSION: COPD. Small pleural effusions and bibasilar atelectasis, or pneumonia. Followup PA and lateral chest X-ray is recommended in 3-4 weeks following trial of antibiotic therapy to ensure resolution and exclude underlying malignancy. Electronically Signed   By: Elon Alas M.D.   On: 02/20/2017 03:22     Assessment/Plan: S/P Procedure(s) (LRB): CORONARY ARTERY BYPASS GRAFTING (CABG) x 2 , using left internal mammary artery and right leg greater saphenous vein harvested endoscopically LIMA-LAD, SVG-RAMUS (N/A) TRANSESOPHAGEAL ECHOCARDIOGRAM (TEE) (N/A) STAPLING OF LARGE LEFT UPPER LOBE PULMONARY BLEB (N/A) ENDOVEIN HARVEST OF RIGHT THIGH GREATER SAPHENOUS VEIN (Right) Mobilize Diuresis Diabetes control Plan for transfer to step-down: see transfer orders     [name] 02/20/2017 8:25 AM

## 2017-02-21 ENCOUNTER — Inpatient Hospital Stay (HOSPITAL_COMMUNITY): Payer: Medicare Other

## 2017-02-21 LAB — BASIC METABOLIC PANEL
Anion gap: 8 (ref 5–15)
BUN: 21 mg/dL — ABNORMAL HIGH (ref 6–20)
CO2: 29 mmol/L (ref 22–32)
Calcium: 8.3 mg/dL — ABNORMAL LOW (ref 8.9–10.3)
Chloride: 96 mmol/L — ABNORMAL LOW (ref 101–111)
Creatinine, Ser: 0.95 mg/dL (ref 0.61–1.24)
GFR calc Af Amer: >60 mL/min (ref >60)
GFR calc non Af Amer: >60 mL/min (ref >60)
Glucose, Bld: 105 mg/dL — ABNORMAL HIGH (ref 65–99)
Potassium: 3.7 mmol/L (ref 3.5–5.1)
Sodium: 133 mmol/L — ABNORMAL LOW (ref 135–145)

## 2017-02-21 LAB — GLUCOSE, CAPILLARY: Glucose-Capillary: 108 mg/dL — ABNORMAL HIGH (ref 65–99)

## 2017-02-21 LAB — CBC
HCT: 25.5 % — ABNORMAL LOW (ref 39.0–52.0)
Hemoglobin: 8.3 g/dL — ABNORMAL LOW (ref 13.0–17.0)
MCH: 30.1 pg (ref 26.0–34.0)
MCHC: 32.5 g/dL (ref 30.0–36.0)
MCV: 92.4 fL (ref 78.0–100.0)
Platelets: 245 10*3/uL (ref 150–400)
RBC: 2.76 MIL/uL — ABNORMAL LOW (ref 4.22–5.81)
RDW: 14.1 % (ref 11.5–15.5)
WBC: 8.2 10*3/uL (ref 4.0–10.5)

## 2017-02-21 MED ORDER — FUROSEMIDE 10 MG/ML IJ SOLN: 40.0000 mg | Freq: Once | INTRAMUSCULAR | Status: DC

## 2017-02-21 MED ORDER — METOPROLOL TARTRATE 25 MG PO TABS
25.0000 mg | ORAL_TABLET | Freq: Two times a day (BID) | ORAL | Status: DC
  Administered 2017-02-21 – 2017-02-24 (×7): 25 mg via ORAL
  Filled 2017-02-21 (×7): qty 1

## 2017-02-21 MED ORDER — FERROUS SULFATE 325 (65 FE) MG PO TABS
325.0000 mg | ORAL_TABLET | Freq: Every day | ORAL | Status: DC
  Administered 2017-02-22 – 2017-02-24 (×3): 325 mg via ORAL
  Filled 2017-02-21 (×3): qty 1

## 2017-02-21 MED ORDER — FUROSEMIDE 40 MG PO TABS: 40.0000 mg | ORAL_TABLET | Freq: Every day | ORAL | Status: DC

## 2017-02-21 MED ORDER — LEVALBUTEROL HCL 0.63 MG/3ML IN NEBU
0.6300 mg | INHALATION_SOLUTION | Freq: Four times a day (QID) | RESPIRATORY_TRACT | Status: DC | PRN
  Administered 2017-02-21 – 2017-02-22 (×2): 0.63 mg via RESPIRATORY_TRACT
  Filled 2017-02-21: qty 3

## 2017-02-21 MED ORDER — IPRATROPIUM BROMIDE 0.02 % IN SOLN
0.5000 mg | Freq: Three times a day (TID) | RESPIRATORY_TRACT | Status: DC
Start: 2017-02-21 — End: 2017-02-24
  Administered 2017-02-21 – 2017-02-24 (×9): 0.5 mg via RESPIRATORY_TRACT
  Filled 2017-02-21 (×9): qty 2.5

## 2017-02-21 MED ORDER — FOLIC ACID 1 MG PO TABS
1.0000 mg | ORAL_TABLET | Freq: Every day | ORAL | Status: DC
  Administered 2017-02-21 – 2017-02-24 (×4): 1 mg via ORAL
  Filled 2017-02-21 (×4): qty 1

## 2017-02-21 MED ORDER — POTASSIUM CHLORIDE CRYS ER 20 MEQ PO TBCR
40.0000 meq | EXTENDED_RELEASE_TABLET | Freq: Every day | ORAL | Status: DC
  Administered 2017-02-21: 40 meq via ORAL
  Filled 2017-02-21: qty 2

## 2017-02-21 MED ORDER — POTASSIUM CHLORIDE CRYS ER 20 MEQ PO TBCR
30.0000 meq | EXTENDED_RELEASE_TABLET | Freq: Once | ORAL | Status: AC
  Filled 2017-02-21: qty 1

## 2017-02-21 MED ORDER — FUROSEMIDE 10 MG/ML IJ SOLN
40.0000 mg | Freq: Once | INTRAMUSCULAR | Status: AC
  Administered 2017-02-21: 40 mg via INTRAVENOUS
  Filled 2017-02-21: qty 4

## 2017-02-21 NOTE — Progress Notes (Signed)
Occupational Therapy Evaluation Patient Details Name: Thomas Trujillo MRN: [id number] DOB: May 30, 1946 Today's Date: 02/21/2017    History of Present Illness Patient is a 71 y/o male admitted with NSTEMI now s/p CABG x 2.  PMH positive for cataract excision, nasal polyp surgery.     Clinical Impression   PTA, pt lived alone and was independent with ADL and mobility. Pt limited by SOB during assessment. RT gave breathing treatment during session. Pt desats to 84 on 2L while walking to sink, washing hands/face and back to bed. Dyspnea 4/4. HR 103. At this time, recommend rehab at SNF prior to return home. If pt progresses, may be appropriate for HHOT. Pt does not feel that his son will be able to provide 24/7 S after DC. Will follow acutely to address established goals to maximize functional level of independence and facilitate DC to next venue of care. Pt appreciative.     Follow Up Recommendations  SNF;Supervision/Assistance - 24 hour    Equipment Recommendations  3 in 1 bedside commode    Recommendations for Other Services       Precautions / Restrictions Precautions Precautions: Sternal;Fall Precaution Comments: watch O2 sats Restrictions Weight Bearing Restrictions: Yes Other Position/Activity Restrictions: Sternal precautions      Mobility Bed Mobility Overal bed mobility: Needs Assistance Bed Mobility: Sit to Sidelying Rolling: Min assist Sidelying to sit: Min assist;HOB elevated     Sit to sidelying: Min assist General bed mobility comments: Cues for use of pillow and sidelying technique.  Assist to bring LE's onto bed.  Transfers Overall transfer level: Needs assistance Equipment used: None Transfers: Sit to/from Stand Sit to Stand: Min guard         General transfer comment: Pt using pillow independently    Balance Overall balance assessment: Needs assistance Sitting-balance support: No upper extremity supported;Feet supported Sitting balance-Leahy Scale:  Fair     Standing balance support: No upper extremity supported Standing balance-Leahy Scale: Fair Standing balance comment: hugging pillow with sit to stand                           ADL either performed or assessed with clinical judgement   ADL Overall ADL's : Needs assistance/impaired     Grooming: Set up;Standing;Min guard Grooming Details (indicate cue type and reason): dyspnea 3/4 reqruiing rest break with walking ot bathroom sink Upper Body Bathing: Minimal assistance;Sitting   Lower Body Bathing: Moderate assistance;Sit to/from stand   Upper Body Dressing : Minimal assistance   Lower Body Dressing: Moderate assistance;Sit to/from stand   Toilet Transfer: Minimal assistance;RW;Ambulation   Toileting- Clothing Manipulation and Hygiene: Minimal assistance;Sit to/from stand       Functional mobility during ADLs: Minimal assistance;Rolling walker;Cueing for safety General ADL Comments: Becomes SOB wiht  in activity. Desat to 84 2L. Requries stopping activity  vc to not pull up on RW     Vision Baseline Vision/History: Wears glasses       Perception     Praxis      Pertinent Vitals/Pain Pain Assessment: 0-10 Pain Score: 1  Pain Location: shortness of breath Pain Descriptors / Indicators: Discomfort;Sore Pain Intervention(s): Limited activity within patient's tolerance     Hand Dominance Right   Extremity/Trunk Assessment Upper Extremity Assessment Upper Extremity Assessment: Generalized weakness   Lower Extremity Assessment Lower Extremity Assessment: Generalized weakness   Cervical / Trunk Assessment Cervical / Trunk Assessment: Normal   Communication Communication Communication: No difficulties  Cognition Arousal/Alertness: Awake/alert Behavior During Therapy: WFL for tasks assessed/performed;Flat affect Overall Cognitive Status: Within Functional Limits for tasks assessed                                     General  Comments       Exercises     Shoulder Instructions      Home Living Family/patient expects to be discharged to:: Private residence Living Arrangements: Alone Available Help at Discharge: Family;Available PRN/intermittently Type of Home: Apartment Home Access: Level entry     Home Layout: One level     Bathroom Shower/Tub: Teacher, early years/pre: Standard Bathroom Accessibility: Yes How Accessible: Accessible via walker Home Equipment: Grab bars - tub/shower          Prior Functioning/Environment Level of Independence: Independent        Comments: drives; has only been in [place] for 18 months        OT Problem List: Decreased strength;Decreased range of motion;Decreased activity tolerance;Decreased knowledge of use of DME or AE;Cardiopulmonary status limiting activity;Decreased knowledge of precautions;Pain      OT Treatment/Interventions: Therapeutic exercise;Self-care/ADL training;Energy conservation;DME and/or AE instruction;Therapeutic activities;Patient/family education    OT Goals(Current goals can be found in the care plan section) Acute Rehab OT Goals Patient Stated Goal: To return home OT Goal Formulation: With patient Time For Goal Achievement: 03/07/17 Potential to Achieve Goals: Good ADL Goals Pt Will Perform Lower Body Bathing: with modified independence;sit to/from stand Pt Will Perform Lower Body Dressing: with modified independence;sit to/from stand Pt Will Transfer to Toilet: with modified independence;ambulating Additional ADL Goal #1: Pt will independently verbalize 3 energy conservation techiqeus for ADL   OT Frequency: Min 2X/week   Barriers to D/C: Other (comment) (unsure of caregiver support)          Co-evaluation              AM-PAC PT "6 Clicks" Daily Activity     Outcome Measure Help from another person eating meals?: None Help from another person taking care of personal grooming?: A Little Help from another  person toileting, which includes using toliet, bedpan, or urinal?: A Little Help from another person bathing (including washing, rinsing, drying)?: A Lot Help from another person to put on and taking off regular upper body clothing?: A Little Help from another person to put on and taking off regular lower body clothing?: A Lot 6 Click Score: 17   End of Session Equipment Utilized During Treatment: Gait belt;Rolling walker;Oxygen (2-3 L) Nurse Communication: Mobility status;Other (comment) (SOB)  Activity Tolerance: Patient limited by fatigue Patient left: in bed;with call bell/phone within reach;with bed alarm set  OT Visit Diagnosis: Unsteadiness on feet (R26.81);Muscle weakness (generalized) (M62.81);Pain Pain - part of body:  (chest)                Time: 1500-1530 OT Time Calculation (min): 30 min Charges:  OT General Charges $OT Visit: 1 Procedure OT Evaluation $OT Eval Moderate Complexity: 1 Procedure OT Treatments $Self Care/Home Management : 8-22 mins G-Codes:     [facility], OT/L  [phone] 02/21/2017  Ilina Xu,HILLARY 02/21/2017, 3:34 PM

## 2017-02-21 NOTE — Progress Notes (Addendum)
EPW removed per protocol. Pt verbalized understanding of 1 hour bedrest ending at 11:10. Call bell within reach, will continue to monitor. VSS.  Fritz Pickerel, RN

## 2017-02-21 NOTE — Progress Notes (Signed)
Physical Therapy Treatment Patient Details Name: Thomas Trujillo MRN: [id number] DOB: 04/17/1946 Today's Date: 02/21/2017    History of Present Illness Patient is a 71 y/o male admitted with NSTEMI now s/p CABG x 2.  PMH positive for cataract excision, nasal polyp surgery.      PT Comments    Patient is making progress with mobility and gait.   Follow Up Recommendations  Home health PT;Supervision/Assistance - 24 hour     Equipment Recommendations  Rolling walker with 5" wheels    Recommendations for Other Services       Precautions / Restrictions Precautions Precautions: Sternal;Fall Restrictions Weight Bearing Restrictions: Yes Other Position/Activity Restrictions: Sternal precautions    Mobility  Bed Mobility Overal bed mobility: Needs Assistance Bed Mobility: Sit to Sidelying         Sit to sidelying: Min assist General bed mobility comments: Cues for use of pillow and sidelying technique.  Assist to bring LE's onto bed.  Transfers Overall transfer level: Needs assistance Equipment used: None Transfers: Sit to/from Stand Sit to Stand: Min guard         General transfer comment: Verbal cues for sternal precautions and use of pillow.  Assist for safety only.  Ambulation/Gait Ambulation/Gait assistance: Min guard Ambulation Distance (Feet): 255 Feet Assistive device: Rolling walker (2 wheeled) Gait Pattern/deviations: Step-through pattern;Decreased stride length Gait velocity: decreased Gait velocity interpretation: Below normal speed for age/gender General Gait Details: Verbal cues for pursed-lip breathing.  Required 5 standing rest breaks.  Increased O2 to 3L for gait to assist with dyspnea.  Returned to 2L after session.   Stairs            Wheelchair Mobility    Modified Rankin (Stroke Patients Only)       Balance Overall balance assessment: Needs assistance Sitting-balance support: No upper extremity supported;Feet supported Sitting  balance-Leahy Scale: Fair     Standing balance support: No upper extremity supported Standing balance-Leahy Scale: Fair Standing balance comment: hugging pillow with sit to stand                            Cognition Arousal/Alertness: Awake/alert Behavior During Therapy: WFL for tasks assessed/performed;Flat affect Overall Cognitive Status: Within Functional Limits for tasks assessed                                        Exercises      General Comments        Pertinent Vitals/Pain Pain Assessment: No/denies pain (Patient reports no pain, just SOB)    Home Living                      Prior Function            PT Goals (current goals can now be found in the care plan section) Acute Rehab PT Goals Patient Stated Goal: To return home Progress towards PT goals: Progressing toward goals    Frequency    Min 3X/week      PT Plan Current plan remains appropriate    Co-evaluation              AM-PAC PT "6 Clicks" Daily Activity  Outcome Measure  Difficulty turning over in bed (including adjusting bedclothes, sheets and blankets)?: Total Difficulty moving from lying on back to sitting on the side of the  bed? : Total Difficulty sitting down on and standing up from a chair with arms (e.g., wheelchair, bedside commode, etc,.)?: A Little Help needed moving to and from a bed to chair (including a wheelchair)?: A Little Help needed walking in hospital room?: A Little Help needed climbing 3-5 steps with a railing? : A Little 6 Click Score: 14    End of Session Equipment Utilized During Treatment: Oxygen Activity Tolerance: Patient limited by fatigue Patient left: in bed;with call bell/phone within reach   PT Visit Diagnosis: Muscle weakness (generalized) (M62.81);Unsteadiness on feet (R26.81)     Time: 3893-7342 PT Time Calculation (min) (ACUTE ONLY): 18 min  Charges:  $Gait Training: 8-22 mins                    G  Codes:       Carita Pian. Sanjuana Kava, [facility] Acute Rehab Services Pager [place] 02/21/2017, 2:29 PM

## 2017-02-21 NOTE — Progress Notes (Addendum)
      [address]       [place] [address]             [phone]        7 Days Post-Op Procedure(s) (LRB): CORONARY ARTERY BYPASS GRAFTING (CABG) x 2 , using left internal mammary artery and right leg greater saphenous vein harvested endoscopically LIMA-LAD, SVG-RAMUS (N/A) TRANSESOPHAGEAL ECHOCARDIOGRAM (TEE) (N/A) STAPLING OF LARGE LEFT UPPER LOBE PULMONARY BLEB (N/A) ENDOVEIN HARVEST OF RIGHT THIGH GREATER SAPHENOUS VEIN (Right)  Subjective: Patient upset he got wrong breakfast. He had a bowel movement this am.  Objective: Vital signs in last 24 hours: Temp:  [98.1 F (36.7 C)-98.5 F (36.9 C)] 98.1 F (36.7 C) (05/01 0557) Pulse Rate:  [96-102] 98 (05/01 0557) Cardiac Rhythm: Sinus tachycardia (05/01 0701) Resp:  [18-25] 18 (05/01 0557) BP: (84-118)/(52-67) 111/59 (05/01 0557) SpO2:  [91 %-99 %] 98 % (05/01 0557) Weight:  [70 kg (154 lb 5.2 oz)] 70 kg (154 lb 5.2 oz) (05/01 0636)  Pre op weight 63.5 kg Current Weight  02/21/17 70 kg (154 lb 5.2 oz)      Intake/Output from previous day: 04/30 0701 - 05/01 0700 In: 600 [P.O.:600] Out: 1600 [Urine:1600]   Physical Exam:  Cardiovascular: RRR Pulmonary: Slightly diminished at bases. Abdomen: Soft, non tender, bowel sounds present. Extremities: Bilateral lower extremity edema. Wounds: Clean and dry.  No erythema or signs of infection.  Lab Results: CBC: Recent Labs  02/20/17 0338 02/21/17 0227  WBC 8.0 8.2  HGB 8.7* 8.3*  HCT 26.0* 25.5*  PLT 219 245   BMET:  Recent Labs  02/20/17 0338 02/21/17 0227  NA 134* 133*  K 3.8 3.7  CL 94* 96*  CO2 33* 29  GLUCOSE 118* 105*  BUN 26* 21*  CREATININE 1.00 0.95  CALCIUM 8.4* 8.3*    PT/INR:  Lab Results  Component Value Date   INR 1.62 02/14/2017   INR 1.11 02/13/2017   ABG:  INR: Will add last result for INR, ABG once components are confirmed Will add last 4 CBG results once components are  confirmed  Assessment/Plan:  1. CV - ST in the low 100's . On Lopressor 12.5 mg bid so will increase to 25 mg bid. 2.  Pulmonary - On 2 liters of oxygen via [place]. Wean to room air. CXR this am shows changes related to COPD, small bilateral pleural effusions, and no pneumothorax. Encourage incentive spirometer. 3. Volume Overload - Will give Lasix IV 40 mg this am and oral Lasix 40 mg daily in am. 4.  Acute blood loss anemia -  H and H slightly decreased to 8.3 and 25.5 this am. Start oral ferrous and folic acid. 5. Supplement potassium 6. CBGs 114/110/108. Pre op HGA1C 5.5. Will stop accu checks and SS PRN. 7. Remove EPW 8. Likely home in 1-2 days  ZIMMERMAN,DONIELLE MPA-C 02/21/2017,8:20 AM  Lives alone likely will need snf  I have seen and examined Thomas Trujillo and agree with the above assessment  and plan.  [name] MD [facility] [phone] Office [phone] 02/21/2017 2:27 PM

## 2017-02-21 NOTE — Progress Notes (Signed)
CARDIAC REHAB PHASE I   PRE:  Rate/Rhythm: 90 SR  BP:  Supine:   Sitting: 100/48  Standing:    SaO2: 90-91%RA  MODE:  Ambulation: 280 ft   POST:  Rate/Rhythm: 125 ST  BP:  Supine:   Sitting: 131/62  Standing:    SaO2: 93% 2L hall,      83-88% 2L in room  Took 3L to get sats up [id number] Pt walked 280 ft on 2L with rolling walker and asst x 2. Took a couple of standing rest breaks. Encouraged pt to use purse lip breathing. Sats were good in hall but desat in room. Pt had been assisted to bed for pacing wire removal and sats continued to drop. Put on 3L to get sats up and encouraged PLB. Turned oxygen to 2L after sats returned normal. Encouraged two more walks. IS and flutter valve with in reach.   Graylon Good, RN BSN  02/21/2017 9:40 AM

## 2017-02-21 NOTE — Progress Notes (Signed)
Spoke with son at bedside r/t discharge needs. Pt and son are agreeable to pt discharging to SNF or to home with home health. Son is a Government social research officer from home and would move into the patient's home for 2 weeks to provide 24 hour supervision. Pt does not have Jhordan Mckibben or shower chair at home currently. Son has good support network from his church should he need someone to stay with the patient or provide groceries. Son also has access to a chef through the church to provide heart healthy meals - given information on heart healthy diet.   Fritz Pickerel, RN

## 2017-02-21 NOTE — Discharge Instructions (Signed)
We ask the SNF to please do the following: °1. Please obtain vital signs at least one time daily °2.Please weigh the patient daily. If he or she continues to gain weight or develops lower extremity edema, contact the office at [phone]. °3. Ambulate patient at least three times daily and please use sternal precautions. ° °Coronary Artery Bypass Grafting, Care After °This sheet gives you information about how to care for yourself after your procedure. Your health care provider may also give you more specific instructions. If you have problems or questions, contact your health care provider. °What can I expect after the procedure? °After the procedure, it is common to have: °· Nausea and a lack of appetite. °· Constipation. °· Weakness and fatigue. °· Depression or irritability. °· Pain or discomfort in your incision areas. °Follow these instructions at home: °Medicines  °· Take over-the-counter and prescription medicines only as told by your health care provider. Do not stop taking medicines or start any new medicines without approval from your health care provider. °· If you were prescribed an antibiotic medicine, take it as told by your health care provider. Do not stop taking the antibiotic even if you start to feel better. °· Do not drive or use heavy machinery while taking prescription pain medicine. °Incision care  °· Follow instructions from your health care provider about how to take care of your incisions. Make sure you: °¨ Wash your hands with soap and water before you change your bandage (dressing). If soap and water are not available, use hand sanitizer. °¨ Change your dressing as told by your health care provider. °¨ Leave stitches (sutures), skin glue, or adhesive strips in place. These skin closures may need to stay in place for 2 weeks or longer. If adhesive strip edges start to loosen and curl up, you may trim the loose edges. Do not remove adhesive strips completely unless your health care  provider tells you to do that. °· Keep incision areas clean, dry, and protected. °· Check your incision areas every day for signs of infection. Check for: °¨ More redness, swelling, or pain. °¨ More fluid or blood. °¨ Warmth. °¨ Pus or a bad smell. °· If incisions were made in your legs: °¨ Avoid crossing your legs. °¨ Avoid sitting for long periods of time. Change positions every 30 minutes. °¨ Raise (elevate) your legs when you are sitting. °Bathing  °· Do not take baths, swim, or use a hot tub until your health care provider approves. °· Only take sponge baths. Pat the incisions dry. Do not rub incisions with a washcloth or towel. °· Ask your health care provider when you can shower. °Eating and drinking  °· Eat foods that are high in fiber, such as raw fruits and vegetables, whole grains, beans, and nuts. Meats should be lean cut. Avoid canned, processed, and fried foods. This can help prevent constipation and is a recommended part of a heart-healthy diet. °· Drink enough fluid to keep your urine clear or pale yellow. °· Limit alcohol intake to no more than 1 drink a day for nonpregnant women and 2 drinks a day for men. One drink equals 12 oz of beer, 5 oz of wine, or 1½ oz of hard liquor. °Activity  °· Rest and limit your activity as told by your health care provider. You may be instructed to: °¨ Stop any activity right away if you have chest pain, shortness of breath, irregular heartbeats, or dizziness. Get help right away if you   have any of these symptoms. °¨ Move around frequently for short periods or take short walks as directed by your health care provider. Gradually increase your activities. You may need physical therapy or cardiac rehabilitation to help strengthen your muscles and build your endurance. °¨ Avoid lifting, pushing, or pulling anything that is heavier than 10 lb (4.5 kg) for at least 6 weeks or as told by your health care provider. °· Do not drive until your health care provider  approves. °· Ask your health care provider when you may return to work. °· Ask your health care provider when you may resume sexual activity. °General instructions  °· Do not use any products that contain nicotine or tobacco, such as cigarettes and e-cigarettes. If you need help quitting, ask your health care provider. °· Take 2-3 deep breaths every few hours during the day, while you recover. This helps expand your lungs and prevent complications like pneumonia after surgery. °· If you were given a device called an incentive spirometer, use it several times a day to practice deep breathing. Support your chest with a pillow or your arms when you take deep breaths or cough. °· Wear compression stockings as told by your health care provider. These stockings help to prevent blood clots and reduce swelling in your legs. °· Weigh yourself every day. This helps identify if your body is holding (retaining) fluid that may make your heart and lungs work harder. °· Keep all follow-up visits as told by your health care provider. This is important. °Contact a health care provider if: °· You have more redness, swelling, or pain around any incision. °· You have more fluid or blood coming from any incision. °· Any incision feels warm to the touch. °· You have pus or a bad smell coming from any incision °· You have a fever. °· You have swelling in your ankles or legs. °· You have pain in your legs. °· You gain 2 lb (0.9 kg) or more a day. °· You are nauseous or you vomit. °· You have diarrhea. °Get help right away if: °· You have chest pain that spreads to your jaw or arms. °· You are short of breath. °· You have a fast or irregular heartbeat. °· You notice a "clicking" in your breastbone (sternum) when you move. °· You have numbness or weakness in your arms or legs. °· You feel dizzy or light-headed. °Summary °· After the procedure, it is common to have pain or discomfort in the incision areas. °· Do not take baths, swim, or use a  hot tub until your health care provider approves. °· Gradually increase your activities. You may need physical therapy or cardiac rehabilitation to help strengthen your muscles and build your endurance. °· Weigh yourself every day. This helps identify if your body is holding (retaining) fluid that may make your heart and lungs work harder. °This information is not intended to replace advice given to you by your health care provider. Make sure you discuss any questions you have with your health care provider. °Document Released: 04/29/2005 Document Revised: 08/29/2016 Document Reviewed: 08/29/2016 °Elsevier Interactive Patient Education © 2017 [employer] ° °

## 2017-02-22 MED ORDER — METOLAZONE 5 MG PO TABS
5.0000 mg | ORAL_TABLET | Freq: Every day | ORAL | Status: DC
  Administered 2017-02-22: 5 mg via ORAL
  Filled 2017-02-22: qty 1

## 2017-02-22 MED ORDER — FUROSEMIDE 10 MG/ML IJ SOLN
40.0000 mg | Freq: Once | INTRAMUSCULAR | Status: AC
  Administered 2017-02-22: 40 mg via INTRAVENOUS
  Filled 2017-02-22: qty 4

## 2017-02-22 MED ORDER — POTASSIUM CHLORIDE CRYS ER 20 MEQ PO TBCR
40.0000 meq | EXTENDED_RELEASE_TABLET | Freq: Two times a day (BID) | ORAL | Status: DC
  Administered 2017-02-22 (×2): 40 meq via ORAL
  Filled 2017-02-22 (×2): qty 2

## 2017-02-22 NOTE — Progress Notes (Signed)
Clinical Social Worker was consulted for SNF placement for patient. Per RNCM son will be taking care of patient at home and will provide patient with 24/hr supervision needed. CSW signing of as patient has no more social work needs.   Rhea Pink, MSW,  [place]

## 2017-02-22 NOTE — Progress Notes (Signed)
Physical Therapy Treatment Patient Details Name: Thomas Trujillo MRN: [id number] DOB: 12-Apr-1946 Today's Date: 02/22/2017    History of Present Illness Patient is a 71 y/o male admitted with NSTEMI now s/p CABG x 2.  PMH positive for cataract excision, nasal polyp surgery.      PT Comments    Pt declined AM attempt, having just ambulated, but agreeable in PM.  Pt able to increase gait distance to ~400' with RW, requiring 3 standing rest breaks.  O2 maintained above 90% on 1L via [place] throughout ambulation, except on 3rd standing rest break sats dropped to 84%.  Increased O2 to 2L and back to 1L when saturations returned to greater than 90% and they remained >90% for remainder of session.     Follow Up Recommendations  Home health PT;Supervision/Assistance - 24 hour     Equipment Recommendations  Rolling walker with 5" wheels    Recommendations for Other Services       Precautions / Restrictions Precautions Precautions: Sternal;Fall Precaution Comments: watch O2 sats Restrictions Weight Bearing Restrictions: Yes Other Position/Activity Restrictions: sternal precautions    Mobility  Bed Mobility               General bed mobility comments: in chair on arrival  Transfers Overall transfer level: Needs assistance Equipment used: None (heart pillow) Transfers: Sit to/from Stand Sit to Stand: Supervision         General transfer comment: x2  Ambulation/Gait Ambulation/Gait assistance: Supervision Ambulation Distance (Feet): 400 Feet Assistive device: Rolling walker (2 wheeled) Gait Pattern/deviations: Step-through pattern;Decreased stride length Gait velocity: decreased   General Gait Details: min VCs for pursed lip breathing, on 1L of o2 maintained sats >90% for 80% of walk, required 3 standing rest breaks and on 3rd break o2 dropped to 84% but returned to 94% on 2L and remained above 91% when reduced back down to 1L   Stairs            Wheelchair  Mobility    Modified Rankin (Stroke Patients Only)       Balance                                            Cognition Arousal/Alertness: Awake/alert Behavior During Therapy: WFL for tasks assessed/performed;Flat affect                                          Exercises      General Comments        Pertinent Vitals/Pain Pain Assessment: No/denies pain    Home Living                      Prior Function            PT Goals (current goals can now be found in the care plan section) Acute Rehab PT Goals PT Goal Formulation: With patient Time For Goal Achievement: 02/23/17 Potential to Achieve Goals: Good Progress towards PT goals: Progressing toward goals    Frequency    Min 3X/week      PT Plan Current plan remains appropriate    Co-evaluation              AM-PAC PT "6 Clicks" Daily Activity  Outcome Measure  Difficulty turning over  in bed (including adjusting bedclothes, sheets and blankets)?: A Little Difficulty moving from lying on back to sitting on the side of the bed? : A Little Difficulty sitting down on and standing up from a chair with arms (e.g., wheelchair, bedside commode, etc,.)?: A Little Help needed moving to and from a bed to chair (including a wheelchair)?: A Little Help needed walking in hospital room?: A Little Help needed climbing 3-5 steps with a railing? : A Little 6 Click Score: 18    End of Session Equipment Utilized During Treatment: Oxygen Activity Tolerance: Patient limited by fatigue Patient left: in chair;with call bell/phone within reach Nurse Communication: Mobility status PT Visit Diagnosis: Muscle weakness (generalized) (M62.81);Unsteadiness on feet (R26.81)     Time: 4098-1191 PT Time Calculation (min) (ACUTE ONLY): 38 min  Charges:  $Therapeutic Exercise: 38-52 mins                    G Codes:          Pola Furno E Penven-Crew 02/22/2017, 1:43 PM

## 2017-02-22 NOTE — Progress Notes (Signed)
CARDIAC REHAB PHASE I   PRE:  Rate/Rhythm: 95 SR  BP:  Supine:   Sitting: 100/54  Standing:    SaO2: 96% 2.5L  MODE:  Ambulation: 340 ft   POST:  Rate/Rhythm: 112 ST  BP:  Supine:   Sitting: 126/68  Standing:    SaO2: 92-93% 2L hall and 95% in room 0828-0900 Pt seems to be feeling better today. Walked 340 ft on 2L with rolling walker and asst x 1. Stopped twice to rest. Sats good on 2L. Will try to wean next walk. Still with some DOE. Re enforced sternal precautions when standing and lying. To bed after walk. Left on 2L.    Graylon Good, RN BSN  02/22/2017 8:58 AM

## 2017-02-22 NOTE — Progress Notes (Addendum)
      [address]       ,[place] [address]             [phone]      8 Days Post-Op Procedure(s) (LRB): CORONARY ARTERY BYPASS GRAFTING (CABG) x 2 , using left internal mammary artery and right leg greater saphenous vein harvested endoscopically LIMA-LAD, SVG-RAMUS (N/A) TRANSESOPHAGEAL ECHOCARDIOGRAM (TEE) (N/A) STAPLING OF LARGE LEFT UPPER LOBE PULMONARY BLEB (N/A) ENDOVEIN HARVEST OF RIGHT THIGH GREATER SAPHENOUS VEIN (Right)   Subjective:  Thomas Trujillo is feeling a bit better. He feels like he is breathing better.  He continues to experience dry cough.   Objective: Vital signs in last 24 hours: Temp:  [97.4 F (36.3 C)-98.5 F (36.9 C)] 97.4 F (36.3 C) (05/02 0440) Pulse Rate:  [90-104] 91 (05/02 0440) Cardiac Rhythm: Normal sinus rhythm (05/02 0700) Resp:  [18-22] 20 (05/02 0440) BP: (100-131)/(48-65) 106/57 (05/02 0440) SpO2:  [90 %-99 %] 96 % (05/02 0440) Weight:  [153 lb 12.8 oz (69.8 kg)] 153 lb 12.8 oz (69.8 kg) (05/02 0300)  Intake/Output from previous day: 05/01 0701 - 05/02 0700 In: 600 [P.O.:600] Out: 675 [Urine:675]  General appearance: alert, cooperative and no distress Heart: regular rate and rhythm Lungs: clear to auscultation bilaterally Abdomen: soft, non-tender; bowel sounds normal; no masses,  no organomegaly Extremities: edema trace Wound: clean and dry  Lab Results:  Recent Labs  02/20/17 0338 02/21/17 0227  WBC 8.0 8.2  HGB 8.7* 8.3*  HCT 26.0* 25.5*  PLT 219 245   BMET:  Recent Labs  02/20/17 0338 02/21/17 0227  NA 134* 133*  K 3.8 3.7  CL 94* 96*  CO2 33* 29  GLUCOSE 118* 105*  BUN 26* 21*  CREATININE 1.00 0.95  CALCIUM 8.4* 8.3*    PT/INR: No results for input(s): LABPROT, INR in the last 72 hours. ABG    Component Value Date/Time   PHART 7.393 02/18/2017 0422   HCO3 30.5 (H) 02/18/2017 0422   TCO2 30 02/18/2017 0503   ACIDBASEDEF 2.0 02/15/2017 0445   O2SAT 98.0 02/18/2017 0422   CBG (last  3)   Recent Labs  02/20/17 1648 02/20/17 2013 02/21/17 0656  GLUCAP 114* 110* 108*    Assessment/Plan: S/P Procedure(s) (LRB): CORONARY ARTERY BYPASS GRAFTING (CABG) x 2 , using left internal mammary artery and right leg greater saphenous vein harvested endoscopically LIMA-LAD, SVG-RAMUS (N/A) TRANSESOPHAGEAL ECHOCARDIOGRAM (TEE) (N/A) STAPLING OF LARGE LEFT UPPER LOBE PULMONARY BLEB (N/A) ENDOVEIN HARVEST OF RIGHT THIGH GREATER SAPHENOUS VEIN (Right)  1. CV- Mild Sinus Tach, rate in the 90s- continue Lopressor at 25 mg BID 2. Pulm- + COPD, continue aggressive pulmonary toilet, wean oxygen if able however, patient may require oxygen at discharge 3. Renal- creatinine has been stable, remains hypervolemic- will repeat IV lasix, add Metolazone 4. Expected blood loss anemia- Hgb has been stable, will start iron 5. Deconditioning- PT/OT recs SNF.[name] Consult for social work placed 6. Dispo- patient stable, will repeat IV diuretics today, continue PT/OT... Maybe for SNF Friday if bed is available  LOS: 8 days    Ellwood Handler 02/22/2017   Rehab was recommended, now says that patient to have son caring for him at home  When ready for d/c I have seen and examined Thomas Trujillo and agree with the above assessment  and plan.  [name] MD Beeper [phone] Office [phone] 02/22/2017 3:24 PM

## 2017-02-23 LAB — BASIC METABOLIC PANEL
Anion gap: 9 (ref 5–15)
BUN: 23 mg/dL — AB (ref 6–20)
CALCIUM: 8.2 mg/dL — AB (ref 8.9–10.3)
CO2: 28 mmol/L (ref 22–32)
CREATININE: 1.06 mg/dL (ref 0.61–1.24)
Chloride: 96 mmol/L — ABNORMAL LOW (ref 101–111)
GFR calc Af Amer: >60 mL/min (ref >60)
GLUCOSE: 103 mg/dL — AB (ref 65–99)
Potassium: 3.6 mmol/L (ref 3.5–5.1)
SODIUM: 133 mmol/L — AB (ref 135–145)

## 2017-02-23 MED ORDER — POTASSIUM CHLORIDE CRYS ER 20 MEQ PO TBCR
20.0000 meq | EXTENDED_RELEASE_TABLET | Freq: Every day | ORAL | Status: DC
  Administered 2017-02-23 – 2017-02-24 (×2): 20 meq via ORAL
  Filled 2017-02-23 (×2): qty 1

## 2017-02-23 MED ORDER — FUROSEMIDE 40 MG PO TABS
40.0000 mg | ORAL_TABLET | Freq: Every day | ORAL | Status: DC
  Administered 2017-02-23 – 2017-02-24 (×2): 40 mg via ORAL
  Filled 2017-02-23 (×2): qty 1

## 2017-02-23 NOTE — Progress Notes (Signed)
SATURATION QUALIFICATIONS: (This note is used to comply with regulatory documentation for home oxygen)  Patient Saturations on Room Air at Rest = 88%  Patient Saturations on Room Air while Ambulating    Patient Saturations on 2 Liters of oxygen while Ambulating = 95%  Please briefly explain why patient needs home oxygen: patient needs home oxygen to maintain adequate perfusion.

## 2017-02-23 NOTE — Progress Notes (Addendum)
[address]       [place] [address]             [phone]      9 Days Post-Op Procedure(s) (LRB): CORONARY ARTERY BYPASS GRAFTING (CABG) x 2 , using left internal mammary artery and right leg greater saphenous vein harvested endoscopically LIMA-LAD, SVG-RAMUS (N/A) TRANSESOPHAGEAL ECHOCARDIOGRAM (TEE) (N/A) STAPLING OF LARGE LEFT UPPER LOBE PULMONARY BLEB (N/A) ENDOVEIN HARVEST OF RIGHT THIGH GREATER SAPHENOUS VEIN (Right) Subjective: No issues overnight. Still without bowel movement, however feels it is coming.   Objective: Vital signs in last 24 hours: Temp:  [97.9 F (36.6 C)-98.8 F (37.1 C)] 97.9 F (36.6 C) (05/03 0525) Pulse Rate:  [87-98] 87 (05/03 0525) Cardiac Rhythm: Normal sinus rhythm (05/03 0700) Resp:  [16-18] 16 (05/03 0525) BP: (92-108)/(48-55) 107/53 (05/03 0525) SpO2:  [91 %-98 %] 91 % (05/03 0752) Weight:  [69.4 kg (152 lb 14.4 oz)] 69.4 kg (152 lb 14.4 oz) (05/03 0525)     Intake/Output from previous day: 05/02 0701 - 05/03 0700 In: 840 [P.O.:840] Out: 2751 [Urine:1735] Intake/Output this shift: No intake/output data recorded.  General appearance: alert, cooperative and no distress Heart: regular rate and rhythm, S1, S2 normal, no murmur, click, rub or gallop Lungs: clear to auscultation bilaterally and with imspiratory wheeze in bilateral upper lobes Abdomen: soft, non-tender; bowel sounds normal; no masses,  no organomegaly Extremities: extremities normal, atraumatic, no cyanosis or edema Wound: clean and dry. No drainage  Lab Results:  Recent Labs  02/21/17 0227  WBC 8.2  HGB 8.3*  HCT 25.5*  PLT 245   BMET:  Recent Labs  02/21/17 0227 02/23/17 0213  NA 133* 133*  K 3.7 3.6  CL 96* 96*  CO2 29 28  GLUCOSE 105* 103*  BUN 21* 23*  CREATININE 0.95 1.06  CALCIUM 8.3* 8.2*    PT/INR: No results for input(s): LABPROT, INR in the last 72 hours. ABG    Component Value Date/Time   PHART 7.393 02/18/2017  0422   HCO3 30.5 (H) 02/18/2017 0422   TCO2 30 02/18/2017 0503   ACIDBASEDEF 2.0 02/15/2017 0445   O2SAT 98.0 02/18/2017 0422   CBG (last 3)   Recent Labs  02/20/17 1648 02/20/17 2013 02/21/17 0656  GLUCAP 114* 110* 108*    Assessment/Plan: S/P Procedure(s) (LRB): CORONARY ARTERY BYPASS GRAFTING (CABG) x 2 , using left internal mammary artery and right leg greater saphenous vein harvested endoscopically LIMA-LAD, SVG-RAMUS (N/A) TRANSESOPHAGEAL ECHOCARDIOGRAM (TEE) (N/A) STAPLING OF LARGE LEFT UPPER LOBE PULMONARY BLEB (N/A) ENDOVEIN HARVEST OF RIGHT THIGH GREATER SAPHENOUS VEIN (Right)   1. CV-NSR, Rate in the 90s- continue Lopressor at 25 mg BID 2. Pulm- + COPD, continue aggressive pulmonary toilet, wean oxygen if able however, patient may require oxygen at discharge 3. Renal- creatinine has been stable, weight continues to trend down and well below baseline. Switch to PO lasix.  4. Expected blood loss anemia- Hgb has been stable, on iron 5. Deconditioning- PT/OT recs SNF.[name] Consult for social work placed 6. Dispo- patient stable, continue to wean oxygen as tolerated. Ambulate TID. Now, patient prefers to go home with son. Aware he may require oxygen at discharge and [facility] services.     LOS: 9 days    Thomas Trujillo 02/23/2017 Poss home tomorrow. I have seen and examined Thomas Trujillo and agree with the above assessment  and plan.  [name] MD Beeper [phone] Office [phone] 02/23/2017 9:12 AM

## 2017-02-23 NOTE — Progress Notes (Signed)
PT Cancellation Note  Patient Details Name: Calvyn Kurtzman MRN: [id number] DOB: 1946-01-31   Cancelled Treatment:    Reason Eval/Treat Not Completed: Fatigue/lethargy limiting ability to participate. PT Rx attempted x 3 this AM. Pt fatigued on first 2 attempts requesting to remain in bed to nap. He reports he had sat up in recliner approx 3.5 hours this AM. Upon 3rd attempt, pt ambulating in hallway with cardiac rehab. PT to re-attempt as time allows.   Lorriane Shire 02/23/2017, 11:01 AM

## 2017-02-23 NOTE — Progress Notes (Signed)
CARDIAC REHAB PHASE I   PRE:  Rate/Rhythm: 98 SR   BP:  Sitting: 110/58        SaO2: 97.1 1.5 L, 94 RA  MODE:  Ambulation: 350 ft   POST:  Rate/Rhythm: 110 ST  BP:  Sitting: 129/81         SaO2: 90-93 RA  Pt on 1.5L O2, sats 97%. Pt sats on RA at rest in room were 94%. Pt agreeable to try to walk without O2. Pt ambulated 350 ft on RA, rolling walker, assist x1, slow, steady gait, tolerated well, pt c/o some fatigue with distance, mild DOE, denies any other complaints, standing rest x2. Pt sats 90-93 % on RA during ambulation, monitored continuously. Encouraged IS, flutter valve. Pt left on RA, RN aware. Pt to recliner after walk, call bell within reach. Will follow.   [place]pson, RN, BSN 02/23/2017 11:17 AM

## 2017-02-23 NOTE — Progress Notes (Signed)
Occupational Therapy Treatment Patient Details Name: Thomas Trujillo MRN: [id number] DOB: [birth year] Today's Date: 02/23/2017    History of present illness Patient is a 71 y/o male admitted with NSTEMI now s/p CABG x 2.  PMH positive for cataract excision, nasal polyp surgery.     OT comments  Pt with improved activity tolerance today but still experiencing SOB with activity; SpO2 >83% on RA throughout session, applied 2L supplemental O2 at end of session with rise to low 90s. Pt required supervision for toilet transfer and grooming activity standing at the sink. Pt able to maintain sternal precautions throughout activities this session. Educated pt on energy conservation and pursed lip breathing. Updated d/c plan to home with [facility] for follow up to maximize independence and safety with ADL and functional mobility. Will continue to follow acutely.    Follow Up Recommendations  Home health OT;Supervision/Assistance - 24 hour    Equipment Recommendations  3 in 1 bedside commode    Recommendations for Other Services      Precautions / Restrictions Precautions Precautions: Sternal;Fall Precaution Comments: watch O2 sats Restrictions Weight Bearing Restrictions: Yes Other Position/Activity Restrictions: sternal precautions       Mobility Bed Mobility Overal bed mobility: Needs Assistance Bed Mobility: Rolling;Sidelying to Sit Rolling: Supervision Sidelying to sit: Supervision       General bed mobility comments: HOB flat. Good technique.  Transfers Overall transfer level: Needs assistance Equipment used: Rolling walker (2 wheeled) Transfers: Sit to/from Stand Sit to Stand: Supervision         General transfer comment: for safety. Good hand placement and technique    Balance Overall balance assessment: Needs assistance Sitting-balance support: Feet supported;No upper extremity supported Sitting balance-Leahy Scale: Good     Standing balance support: Bilateral upper  extremity supported Standing balance-Leahy Scale: Fair                             ADL either performed or assessed with clinical judgement   ADL Overall ADL's : Needs assistance/impaired     Grooming: Supervision/safety;Wash/dry hands;Standing                   Armed forces technical officer: Supervision/safety;Ambulation;RW   Toileting- Clothing Manipulation and Hygiene: Supervision/safety;Sit to/from stand       Functional mobility during ADLs: Supervision/safety;Rolling walker General ADL Comments: Pt able to maintain sternal precautions throughout. SpO2 >83% on RA throughout. DOE 3/4. Reviewed energy conservation, standing rest breaks during mobility, and pursed lip breathing. Applied 2L supplemental O2 at end of session due to SOB; SpO2 up to low 90s.     Vision       Perception     Praxis      Cognition Arousal/Alertness: Awake/alert Behavior During Therapy: WFL for tasks assessed/performed;Flat affect Overall Cognitive Status: Within Functional Limits for tasks assessed                                          Exercises     Shoulder Instructions       General Comments      Pertinent Vitals/ Pain       Pain Assessment: No/denies pain  Home Living  Prior Functioning/Environment              Frequency  Min 2X/week        Progress Toward Goals  OT Goals(current goals can now be found in the care plan section)  Progress towards OT goals: Progressing toward goals  Acute Rehab OT Goals Patient Stated Goal: To return home OT Goal Formulation: With patient  Plan Discharge plan needs to be updated    Co-evaluation                 AM-PAC PT "6 Clicks" Daily Activity     Outcome Measure   Help from another person eating meals?: None Help from another person taking care of personal grooming?: A Little Help from another person toileting, which includes  using toliet, bedpan, or urinal?: A Little Help from another person bathing (including washing, rinsing, drying)?: A Lot Help from another person to put on and taking off regular upper body clothing?: A Little Help from another person to put on and taking off regular lower body clothing?: A Lot 6 Click Score: 17    End of Session Equipment Utilized During Treatment: Rolling walker  OT Visit Diagnosis: Unsteadiness on feet (R26.81);Muscle weakness (generalized) (M62.81)   Activity Tolerance Patient tolerated treatment well   Patient Left     Nurse Communication          Time: 1537-9432 OT Time Calculation (min): 28 min  Charges: OT General Charges $OT Visit: 1 Procedure OT Treatments $Self Care/Home Management : 23-37 mins  [place] Nabor A. Ulice Brilliant, M.S., OTR/L Pager: [place] 02/23/2017, 3:00 PM

## 2017-02-23 NOTE — Care Management Important Message (Signed)
Important Message  Patient Details  Name: Thomas Trujillo MRN: [id number] Date of Birth: 1946-09-02   Medicare Important Message Given:  Yes    [name] Abena 02/23/2017, 11:12 AM

## 2017-02-24 ENCOUNTER — Inpatient Hospital Stay (HOSPITAL_COMMUNITY)
Admission: EM | Admit: 2017-02-24 | Discharge: 2017-02-26 | Disposition: A | Discharge status: Skilled Nursing Facility | Payer: Medicare Other | Source: Home / Self Care | Attending: Cardiothoracic Surgery | Admitting: Cardiothoracic Surgery

## 2017-02-24 ENCOUNTER — Emergency Department (HOSPITAL_COMMUNITY): Payer: Medicare Other

## 2017-02-24 ENCOUNTER — Encounter (HOSPITAL_COMMUNITY): Payer: Self-pay | Admitting: Emergency Medicine

## 2017-02-24 DIAGNOSIS — I2510 Atherosclerotic heart disease of native coronary artery without angina pectoris: Secondary | ICD-10-CM | POA: Diagnosis present

## 2017-02-24 DIAGNOSIS — R55 Syncope and collapse: Secondary | ICD-10-CM

## 2017-02-24 LAB — URINALYSIS, ROUTINE W REFLEX MICROSCOPIC
BILIRUBIN URINE: NEGATIVE
GLUCOSE, UA: NEGATIVE mg/dL
HGB URINE DIPSTICK: NEGATIVE
KETONES UR: NEGATIVE mg/dL
Leukocytes, UA: NEGATIVE
NITRITE: NEGATIVE
PH: 5 (ref 5.0–8.0)
Protein, ur: NEGATIVE mg/dL
SPECIFIC GRAVITY, URINE: 1.01 (ref 1.005–1.030)

## 2017-02-24 LAB — BASIC METABOLIC PANEL
ANION GAP: 13 (ref 5–15)
BUN: 23 mg/dL — ABNORMAL HIGH (ref 6–20)
CALCIUM: 8.2 mg/dL — AB (ref 8.9–10.3)
CO2: 27 mmol/L (ref 22–32)
CREATININE: 1.19 mg/dL (ref 0.61–1.24)
Chloride: 88 mmol/L — ABNORMAL LOW (ref 101–111)
GFR calc Af Amer: >60 mL/min (ref >60)
GFR calc non Af Amer: >60 mL/min (ref >60)
GLUCOSE: 115 mg/dL — AB (ref 65–99)
Potassium: 3.6 mmol/L (ref 3.5–5.1)
Sodium: 128 mmol/L — ABNORMAL LOW (ref 135–145)

## 2017-02-24 LAB — CBC
HCT: 29.7 % — ABNORMAL LOW (ref 39.0–52.0)
HEMOGLOBIN: 9.6 g/dL — AB (ref 13.0–17.0)
MCH: 30 pg (ref 26.0–34.0)
MCHC: 32.3 g/dL (ref 30.0–36.0)
MCV: 92.8 fL (ref 78.0–100.0)
Platelets: 350 10*3/uL (ref 150–400)
RBC: 3.2 MIL/uL — ABNORMAL LOW (ref 4.22–5.81)
RDW: 14.6 % (ref 11.5–15.5)
WBC: 13.5 10*3/uL — ABNORMAL HIGH (ref 4.0–10.5)

## 2017-02-24 LAB — I-STAT TROPONIN, ED: Troponin i, poc: 0.13 ng/mL (ref 0.00–0.08)

## 2017-02-24 LAB — CBG MONITORING, ED: Glucose-Capillary: 115 mg/dL — ABNORMAL HIGH (ref 65–99)

## 2017-02-24 MED ORDER — TAMSULOSIN HCL 0.4 MG PO CAPS
0.4000 mg | ORAL_CAPSULE | Freq: Every day | ORAL | Status: DC
  Administered 2017-02-25 – 2017-02-26 (×2): 0.4 mg via ORAL
  Filled 2017-02-24 (×2): qty 1

## 2017-02-24 MED ORDER — OXYCODONE HCL 5 MG PO TABS: 5.0000 mg | ORAL_TABLET | ORAL | 0 refills | Status: DC | PRN

## 2017-02-24 MED ORDER — POTASSIUM CHLORIDE CRYS ER 20 MEQ PO TBCR: 20.0000 meq | EXTENDED_RELEASE_TABLET | Freq: Every day | ORAL | 0 refills | Status: DC

## 2017-02-24 MED ORDER — OXYCODONE HCL 5 MG PO TABS: 5.0000 mg | ORAL_TABLET | ORAL | Status: DC | PRN

## 2017-02-24 MED ORDER — IPRATROPIUM-ALBUTEROL 0.5-2.5 (3) MG/3ML IN SOLN
3.0000 mL | Freq: Four times a day (QID) | RESPIRATORY_TRACT | Status: DC | PRN
  Administered 2017-02-25 – 2017-02-26 (×2): 3 mL via RESPIRATORY_TRACT
  Filled 2017-02-24 (×2): qty 3

## 2017-02-24 MED ORDER — ATORVASTATIN CALCIUM 80 MG PO TABS: 80.0000 mg | ORAL_TABLET | Freq: Every day | ORAL | 1 refills | Status: DC

## 2017-02-24 MED ORDER — TRAMADOL HCL 50 MG PO TABS: 50.0000 mg | ORAL_TABLET | Freq: Four times a day (QID) | ORAL | Status: DC | PRN

## 2017-02-24 MED ORDER — BISACODYL 10 MG RE SUPP: 10.0000 mg | Freq: Every day | RECTAL | Status: DC | PRN

## 2017-02-24 MED ORDER — ONDANSETRON HCL 4 MG/2ML IJ SOLN: 4.0000 mg | Freq: Four times a day (QID) | INTRAMUSCULAR | Status: DC | PRN

## 2017-02-24 MED ORDER — SODIUM CHLORIDE 0.9 % IV SOLN: 250.0000 mL | INTRAVENOUS | Status: DC | PRN

## 2017-02-24 MED ORDER — MOVING RIGHT ALONG BOOK
Freq: Once | Status: AC
  Administered 2017-02-24: 1
  Filled 2017-02-24: qty 1

## 2017-02-24 MED ORDER — TAMSULOSIN HCL 0.4 MG PO CAPS: 0.4000 mg | ORAL_CAPSULE | Freq: Every day | ORAL | 1 refills | Status: AC

## 2017-02-24 MED ORDER — ASPIRIN EC 325 MG PO TBEC
325.0000 mg | DELAYED_RELEASE_TABLET | Freq: Every day | ORAL | Status: DC
  Administered 2017-02-24 – 2017-02-26 (×3): 325 mg via ORAL
  Filled 2017-02-24 (×3): qty 1

## 2017-02-24 MED ORDER — SODIUM CHLORIDE 0.9 % IV BOLUS (SEPSIS)
1000.0000 mL | Freq: Once | INTRAVENOUS | Status: AC
  Administered 2017-02-24: 1000 mL via INTRAVENOUS

## 2017-02-24 MED ORDER — ATORVASTATIN CALCIUM 80 MG PO TABS
80.0000 mg | ORAL_TABLET | Freq: Every day | ORAL | Status: DC
Start: 2017-02-24 — End: 2017-02-26
  Administered 2017-02-24 – 2017-02-25 (×2): 80 mg via ORAL
  Filled 2017-02-24 (×2): qty 1

## 2017-02-24 MED ORDER — METOPROLOL TARTRATE 25 MG PO TABS
25.0000 mg | ORAL_TABLET | Freq: Two times a day (BID) | ORAL | Status: DC
  Administered 2017-02-24: 25 mg via ORAL
  Filled 2017-02-24 (×2): qty 1

## 2017-02-24 MED ORDER — IPRATROPIUM-ALBUTEROL 20-100 MCG/ACT IN AERS: 1.0000 | INHALATION_SPRAY | Freq: Four times a day (QID) | RESPIRATORY_TRACT | Status: DC | PRN

## 2017-02-24 MED ORDER — IPRATROPIUM-ALBUTEROL 20-100 MCG/ACT IN AERS: 1.0000 | INHALATION_SPRAY | Freq: Four times a day (QID) | RESPIRATORY_TRACT | 1 refills | Status: DC | PRN

## 2017-02-24 MED ORDER — SODIUM CHLORIDE 0.9% FLUSH: 3.0000 mL | INTRAVENOUS | Status: DC | PRN

## 2017-02-24 MED ORDER — PHENOL 1.4 % MT LIQD: 1.0000 | OROMUCOSAL | Status: DC | PRN

## 2017-02-24 MED ORDER — METOPROLOL TARTRATE 25 MG PO TABS: 25.0000 mg | ORAL_TABLET | Freq: Two times a day (BID) | ORAL | 1 refills | Status: DC

## 2017-02-24 MED ORDER — BISACODYL 5 MG PO TBEC
10.0000 mg | DELAYED_RELEASE_TABLET | Freq: Every day | ORAL | Status: DC | PRN
  Filled 2017-02-24: qty 2

## 2017-02-24 MED ORDER — ONDANSETRON HCL 4 MG PO TABS: 4.0000 mg | ORAL_TABLET | Freq: Four times a day (QID) | ORAL | Status: DC | PRN

## 2017-02-24 MED ORDER — ACETAMINOPHEN 325 MG PO TABS
650.0000 mg | ORAL_TABLET | Freq: Four times a day (QID) | ORAL | Status: DC | PRN
  Administered 2017-02-25: 650 mg via ORAL
  Filled 2017-02-24: qty 2

## 2017-02-24 MED ORDER — FUROSEMIDE 40 MG PO TABS: 40.0000 mg | ORAL_TABLET | Freq: Every day | ORAL | 0 refills | Status: DC

## 2017-02-24 MED ORDER — SODIUM CHLORIDE 0.9% FLUSH
3.0000 mL | Freq: Two times a day (BID) | INTRAVENOUS | Status: DC
  Administered 2017-02-25 (×2): 3 mL via INTRAVENOUS

## 2017-02-24 MED ORDER — ASPIRIN 325 MG PO TBEC: 325.0000 mg | DELAYED_RELEASE_TABLET | Freq: Every day | ORAL | 0 refills | Status: DC

## 2017-02-24 NOTE — Progress Notes (Signed)
SATURATION QUALIFICATIONS: (This note is used to comply with regulatory documentation for home oxygen)  Patient Saturations on Room Air at Rest =87%  Patient Saturations on Room Air while Ambulating = 87%  Patient Saturations on 2 Liters of oxygen while Ambulating = 92%  Please briefly explain why patient needs home oxygen: home oxygen is needed to provide adequate perfusion .

## 2017-02-24 NOTE — Progress Notes (Signed)
Physical Therapy Treatment Patient Details Name: Thomas Trujillo MRN: [id number] DOB: 26-Jan-1946 Today's Date: 02/24/2017    History of Present Illness Patient is a 71 y/o male admitted with NSTEMI now s/p CABG x 2.  PMH positive for cataract excision, nasal polyp surgery.      PT Comments    Pt very pleasant and moving well this morning. Pt had just finished a breathing tx and was able to maintain sats 90-94% throughout activity on RA with pt on RA end of session and nursing notified. Pt able to state 3/5 precautions and educated for all. Pt continues progressing with activity and will need continued O2 monitoring with mobility as pt states 1/3 walks yesterday did not require O2. Will follow.    HR 97-102 BP pre 106/61 Post 110/61  Follow Up Recommendations  Home health PT;Supervision/Assistance - 24 hour     Equipment Recommendations  Rolling walker with 5" wheels    Recommendations for Other Services       Precautions / Restrictions Precautions Precautions: Sternal;Fall Precaution Comments: watch O2 sats    Mobility  Bed Mobility Overal bed mobility: Modified Independent Bed Mobility: Sidelying to Sit;Sit to Sidelying;Rolling           General bed mobility comments: HOB flat, pt able to perform without cues or assist  Transfers Overall transfer level: Modified independent               General transfer comment: pt able to maintain hands on thighs or chest with sit <>stand x 2 trials  Ambulation/Gait Ambulation/Gait assistance: Supervision Ambulation Distance (Feet): 225 Feet Assistive device: Rolling walker (2 wheeled) Gait Pattern/deviations: Step-through pattern;Decreased stride length   Gait velocity interpretation: Below normal speed for age/gender General Gait Details: cues for pursed lip breathing and posture. Pt maintained sats >90% on RA throughout. Pt denied increased distance due to fatigue   Stairs            Wheelchair Mobility     Modified Rankin (Stroke Patients Only)       Balance Overall balance assessment: Needs assistance   Sitting balance-Leahy Scale: Good       Standing balance-Leahy Scale: Fair                              Cognition Arousal/Alertness: Awake/alert Behavior During Therapy: WFL for tasks assessed/performed Overall Cognitive Status: Impaired/Different from baseline Area of Impairment: Memory                     Memory: Decreased recall of precautions         General Comments: Pt able to recall 3/5 precautions at beginning and end of session despite education      Exercises General Exercises - Lower Extremity Long Arc Quad: AROM;15 reps;Both;Seated Hip Flexion/Marching: AROM;15 reps;Both;Seated    General Comments        Pertinent Vitals/Pain Pain Assessment: No/denies pain    Home Living                      Prior Function            PT Goals (current goals can now be found in the care plan section) Progress towards PT goals: Progressing toward goals    Frequency    Min 3X/week      PT Plan Current plan remains appropriate    Co-evaluation  AM-PAC PT "6 Clicks" Daily Activity  Outcome Measure  Difficulty turning over in bed (including adjusting bedclothes, sheets and blankets)?: None Difficulty moving from lying on back to sitting on the side of the bed? : A Little Difficulty sitting down on and standing up from a chair with arms (e.g., wheelchair, bedside commode, etc,.)?: None Help needed moving to and from a bed to chair (including a wheelchair)?: None Help needed walking in hospital room?: A Little Help needed climbing 3-5 steps with a railing? : A Little 6 Click Score: 21    End of Session   Activity Tolerance: Patient tolerated treatment well Patient left: in chair;with call bell/phone within reach Nurse Communication: Mobility status PT Visit Diagnosis: Muscle weakness (generalized)  (M62.81);Difficulty in walking, not elsewhere classified (R26.2)     Time: 3794-3276 PT Time Calculation (min) (ACUTE ONLY): 23 min  Charges:  $Gait Training: 8-22 mins $Therapeutic Exercise: 8-22 mins                    G Codes:       Elwyn Reach, PT [phone]   [place] 02/24/2017, 9:55 AM

## 2017-02-24 NOTE — Progress Notes (Signed)
1418-1542 Education completed with pt and son who voiced understanding. Encouraged IS and flutter valve. Referring to [facility] CRP 2. Case manager getting rolling walker. Graylon Good RN BSN 02/24/2017 3:42 PM

## 2017-02-24 NOTE — Progress Notes (Signed)
      [address]       [place] [address]             [phone]      10 Days Post-Op Procedure(s) (LRB): CORONARY ARTERY BYPASS GRAFTING (CABG) x 2 , using left internal mammary artery and right leg greater saphenous vein harvested endoscopically LIMA-LAD, SVG-RAMUS (N/A) TRANSESOPHAGEAL ECHOCARDIOGRAM (TEE) (N/A) STAPLING OF LARGE LEFT UPPER LOBE PULMONARY BLEB (N/A) ENDOVEIN HARVEST OF RIGHT THIGH GREATER SAPHENOUS VEIN (Right) Subjective: On oxygen this morning. Shares he is feeling okay.  Objective: Vital signs in last 24 hours: Temp:  [98 F (36.7 C)-99.3 F (37.4 C)] 98 F (36.7 C) (05/04 0500) Pulse Rate:  [95-99] 98 (05/04 0500) Cardiac Rhythm: Normal sinus rhythm (05/04 0700) Resp:  [18-20] 20 (05/04 0500) BP: (95-117)/(52-66) 101/57 (05/04 0500) SpO2:  [91 %-95 %] 95 % (05/04 0500) Weight:  [68.4 kg (150 lb 11.2 oz)] 68.4 kg (150 lb 11.2 oz) (05/04 0500)     Intake/Output from previous day: 05/03 0701 - 05/04 0700 In: 1320 [P.O.:1320] Out: 1630 [Urine:1630] Intake/Output this shift: No intake/output data recorded.  General appearance: alert, cooperative and no distress Heart: regular rate and rhythm, S1, S2 normal, no murmur, click, rub or gallop Lungs: clear to auscultation bilaterally Abdomen: soft, non-tender; bowel sounds normal; no masses,  no organomegaly Extremities: extremities normal, atraumatic, no cyanosis or edema Wound: clean and dry without drainage  Lab Results: No results for input(s): WBC, HGB, HCT, PLT in the last 72 hours. BMET:  Recent Labs  02/23/17 0213  NA 133*  K 3.6  CL 96*  CO2 28  GLUCOSE 103*  BUN 23*  CREATININE 1.06  CALCIUM 8.2*    PT/INR: No results for input(s): LABPROT, INR in the last 72 hours. ABG    Component Value Date/Time   PHART 7.393 02/18/2017 0422   HCO3 30.5 (H) 02/18/2017 0422   TCO2 30 02/18/2017 0503   ACIDBASEDEF 2.0 02/15/2017 0445   O2SAT 98.0 02/18/2017 0422   CBG  (last 3)  No results for input(s): GLUCAP in the last 72 hours.  Assessment/Plan: S/P Procedure(s) (LRB): CORONARY ARTERY BYPASS GRAFTING (CABG) x 2 , using left internal mammary artery and right leg greater saphenous vein harvested endoscopically LIMA-LAD, SVG-RAMUS (N/A) TRANSESOPHAGEAL ECHOCARDIOGRAM (TEE) (N/A) STAPLING OF LARGE LEFT UPPER LOBE PULMONARY BLEB (N/A) ENDOVEIN HARVEST OF RIGHT THIGH GREATER SAPHENOUS VEIN (Right)  1. CV-NSR, Rate in the 80s- continue Lopressor at 25 mg BID, watch BP since soft this morning. 2. Pulm- + COPD, continue aggressive pulmonary toilet, wean oxygen if able. Does not qualify for home oxygen 3. Renal- creatinine has been stable, weight continues to trend down and well below baseline. Continue PO lasix.  4. Expected blood loss anemia- Hgb has been stable, on iron 5. Deconditioning- PT/OT recs SNF but patient prefers home with the son 6. Dispo- patient stable, continue to wean oxygen as tolerated. Ambulate TID. Discussion this afternoon with son regarding discharge plan. He is agreeable to going to rehab but wants to make sure that its covered under is insurance.     LOS: 10 days    Elgie Collard 02/24/2017

## 2017-02-24 NOTE — ED Triage Notes (Signed)
Per EMS, patient was released from [facility] today and when he got home and started walking to house, he had a near syncopal episode. Had a CABG x 3 last week.  MI found at [facility] then sent here for admission.  CBG 113, 106/56, 80s HR, 2L 96-99%, A/O x 4.  No complaint of pain. Recently diagnosed with Emphysema and placed on 2L at home.

## 2017-02-24 NOTE — Care Management Note (Addendum)
Case Management Note  Patient Details  Name: Thomas Trujillo MRN: [id number] Date of Birth: 01-28-1946  Subjective/Objective: Pt presented for Chest Pain- CABG 02-14-17. Pt is from home with support of son. Son will work from home. CM did speak with pt in regards to disposition needs.                    Action/Plan: Agency List provided- [facility] chosen-Referral made to [facility] to begin within 24-48 hours. DME to be delivered by [facility] RW and 02. No further needs from CM at this time.   Expected Discharge Date:  02/24/17               Expected Discharge Plan:  [place]  In-House Referral:  NA  Discharge planning Services  CM Consult  Post Acute Care Choice:  Durable Medical Equipment, Home Health Choice offered to:  Patient, Adult Children  DME Arranged:  Oxygen, Walker rolling DME Agency:  [place] Arranged:  RN, PT, OT, Social Work [employer] Agency:  [place]  Status of Service:  Completed, signed off  If discussed at [employer], dates discussed:    Additional Comments: 1627  02-24-17 Jacqlyn Krauss, RN,BSN [phone] CM did speak with Liaison and pt not qualifying for 02 due to saturations from today. CM asked Staff RN to reevaluate- 02 sat dropped to 87 sitting. RN to document. [facility] to provide DME. No further needs from CM at this time.  Bethena Roys, RN 02/24/2017, 3:41 PM

## 2017-02-24 NOTE — H&P (Signed)
[address]       [place] [address]             [phone]        Thomas Trujillo [place] Medical Record #[id number] Date of Birth: [birth year]  Referring: No ref. provider found Primary Care: [name], MD  Chief Complaint:    Chief Complaint  Patient presents with  . Near Syncope    History of Present Illness:     Patient is 71 yo male who presented to [facility] ER 04/23 with non radiating chest pain/pressure at rest, diaphoresis, and exertional dyspnea onset the evening of 04/23.  In the ER initial troponin was >65,  cardiac catheterization 04/25. Cath was done and patient sent urgently to [facility] for emergency CABG    PMH of Former Smoker (1 PPD quit date 2014).  Hospital Course:  Thomas Trujillo underwent a coronary artery bypass grafting 2 with Dr. Servando Snare on 02/15/2017. [name] He was extubated in a timely manner. He did have some nausea postop day 1 which resolved with medication. His renal function remained stable.  He remained in sinus rhythm. 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 His coronary artery bypass grafting surgery was done emergently so there was no pre op ABG on room air or PFTs. He will need to follow up with his medical doctor after discharge. [name] He is on 2 liters of oxygen via [place] and we will attempt to wean him to wean him to room air. His glucose has remained well controlled and his HGA1C pre op was 5.5. Accu checks and SS PRN were stopped on 05/01. He did have ABL anemia post op. He did not require a transfusion. His last H and H was 8.3 and 25.5. He was started on oral Ferrous sulfate and folic acid.  We had recommended he go to SNF but son wanted to take him home. He was d/c this afternoon , got home in [place] felt weak and called 911 and was brought back to ER at [facility] by ems.  He will be returned to 2w  [place]     Current Activity/ Functional Status: Patient is independent with mobility/ambulation, transfers, ADL's, IADL's.   Zubrod Score: At the time of surgery this patient's most appropriate activity status/level should be described as: []     0    Normal activity, no symptoms [x]     1    Restricted in physical strenuous activity but ambulatory, able to do out light work []     2    Ambulatory and capable of self care, unable to do work activities, up and about                 more than 50%  Of the time                            []     3    Only limited self care, in bed greater than 50% of waking hours []     4    Completely disabled, no self care, confined to bed or chair []     5    Moribund  Past Medical History:  Diagnosis Date  . Patient denies medical problems     Past Surgical History:  Procedure Laterality Date  . ABDOMINAL HYSTERECTOMY    . CATARACT  EXTRACTION, BILATERAL    . CORONARY ARTERY BYPASS GRAFT N/A 02/14/2017   Procedure: CORONARY ARTERY BYPASS GRAFTING (CABG) x 2 , using left internal mammary artery and right leg greater saphenous vein harvested endoscopically LIMA-LAD, SVG-RAMUS;  Surgeon: [name], MD;  Location: [facility];  Service: Open Heart Surgery;  Laterality: N/A;  . ENDOVEIN HARVEST OF GREATER SAPHENOUS VEIN Right 02/14/2017   Procedure: ENDOVEIN HARVEST OF RIGHT THIGH GREATER SAPHENOUS VEIN;  Surgeon: [name], MD;  Location: [facility];  Service: Open Heart Surgery;  Laterality: Right;  . LEFT HEART CATH AND CORONARY ANGIOGRAPHY N/A 02/14/2017   Procedure: Left Heart Cath and Coronary Angiography;  Surgeon: [name], MD;  Location: [facility];  Service: Cardiovascular;  Laterality: N/A;  . NASAL POLYP SURGERY    . STAPLING OF BLEBS N/A 02/14/2017   Procedure: STAPLING OF LARGE LEFT UPPER LOBE PULMONARY BLEB;  Surgeon: [name], MD;  Location: [facility];  Service: Open Heart Surgery;  Laterality: N/A;  . TEE WITHOUT  CARDIOVERSION N/A 02/14/2017   Procedure: TRANSESOPHAGEAL ECHOCARDIOGRAM (TEE);  Surgeon: [name], MD;  Location: [facility];  Service: Open Heart Surgery;  Laterality: N/A;    History  Smoking Status  . Former Smoker  Smokeless Tobacco  . Never Used   History  Alcohol Use No    Social History   Social History  . Marital status: Divorced    Spouse name: N/A  . Number of children: N/A  . Years of education: N/A   Occupational History  . Not on file.   Social History Main Topics  . Smoking status: Former Research scientist (life sciences)  . Smokeless tobacco: Never Used  . Alcohol use No  . Drug use: No  . Sexual activity: Not on file   Other Topics Concern  . Not on file   Social History Narrative  . No narrative on file    Allergies  Allergen Reactions  . No Known Allergies     No current facility-administered medications for this encounter.    Current Outpatient Prescriptions  Medication Sig Dispense Refill  . acetaminophen (TYLENOL) 500 MG tablet Take 500 mg by mouth every 6 (six) hours as needed.    Derrill Memo ON 02/25/2017] aspirin 325 MG EC tablet Take 1 tablet (325 mg total) by mouth daily. 30 tablet 0  . aspirin-sod bicarb-citric acid (ALKA-SELTZER) 325 MG TBEF tablet Take 325 mg by mouth every 6 (six) hours as needed.    [name] atorvastatin (LIPITOR) 80 MG tablet Take 1 tablet (80 mg total) by mouth daily at 6 PM. 30 tablet 1  . [START ON 02/25/2017] furosemide (LASIX) 40 MG tablet Take 1 tablet (40 mg total) by mouth daily. 5 tablet 0  . Ipratropium-Albuterol (COMBIVENT) 20-100 MCG/ACT AERS respimat Inhale 1 puff into the lungs every 6 (six) hours as needed for wheezing. 1 Inhaler 1  . metoprolol tartrate (LOPRESSOR) 25 MG tablet Take 1 tablet (25 mg total) by mouth 2 (two) times daily. 30 tablet 1  . oxyCODONE (OXY IR/ROXICODONE) 5 MG immediate release tablet Take 1 tablet (5 mg total) by mouth every 4 (four) hours as needed for severe pain. 30 tablet 0  . [START ON 02/25/2017] potassium  chloride SA (K-DUR,KLOR-CON) 20 MEQ tablet Take 1 tablet (20 mEq total) by mouth daily. 5 tablet 0  . [START ON 02/25/2017] tamsulosin (FLOMAX) 0.4 MG CAPS capsule Take 1 capsule (0.4 mg total) by mouth daily. 30 capsule 1     (  Not in a hospital admission)  Family History  Problem Relation Age of Onset  . Obesity Son      Review of Systems:      Cardiac Review of Systems: Y or N  Chest Pain [  n  ]  Resting SOB [ n  ] Exertional SOB  Blue.Reese  ]  Orthopnea Florencio.Farrier  ]   Pedal Edema [ n  ]    Palpitations [n  ] Syncope  [n  ]   Presyncope [ y  ]  General Review of Systems: [Y] = yes [  ]=no Constitional: recent weight change [  ]; anorexia [  ]; fatigue [  ]; nausea [  ]; night sweats [  ]; fever [  ]; or chills [  ]                                                               Dental: poor dentition[  ]; Last Dentist visit:   Eye : blurred vision [  ]; diplopia [   ]; vision changes [  ];  Amaurosis fugax[  ]; Resp: cough [  ];  wheezing[  ];  hemoptysis[  ]; shortness of breath[  ]; paroxysmal nocturnal dyspnea[  ]; dyspnea on exertion[  ]; or orthopnea[  ];  GI:  gallstones[  ], vomiting[  ];  dysphagia[  ]; melena[  ];  hematochezia [  ]; heartburn[  ];   Hx of  Colonoscopy[  ]; GU: kidney stones [  ]; hematuria[  ];   dysuria [  ];  nocturia[  ];  history of     obstruction [  ]; urinary frequency [  ]             Skin: rash, swelling[  ];, hair loss[  ];  peripheral edema[  ];  or itching[  ]; Musculosketetal: myalgias[  ];  joint swelling[  ];  joint erythema[  ];  joint pain[  ];  back pain[  ];  Heme/Lymph: bruising[  ];  bleeding[  ];  anemia[  ];  Neuro: TIA[  ];  headaches[  ];  stroke[  ];  vertigo[  ];  seizures[  ];   paresthesias[  ];  difficulty walking[  ];  Psych:depression[  ]; anxiety[  ];  Endocrine: diabetes[  ];  thyroid dysfunction[  ];  Immunizations: Flu [  ]; Pneumococcal[  ];  Other:  Physical Exam: BP (!) 102/58   Pulse 86   Resp (!) 23   Ht 5\' 5"  (1.651 m)    Wt 150 lb (68 kg)   SpO2 95%   BMI 24.96 kg/m   Exam unchanged from this am  Diagnostic Studies & Laboratory data:     Recent Radiology Findings:   Dg Chest Port 1 View  Result Date: 02/24/2017 CLINICAL DATA:  Near syncope. EXAM: PORTABLE CHEST 1 VIEW COMPARISON:  Feb 21, 2017 FINDINGS: No pneumothorax. A small left effusion is identified. Bibasilar opacities are seen, stable on the left and increased in the medial right base in the interval. The cardiomediastinal silhouette is stable. IMPRESSION: The opacity in the medial right lung base has increased in the interval. There is probably a tiny right effusion. A tiny left effusion and underlying opacity  on the left is stable. Recommend follow-up to resolution. Electronically Signed   By: Dorise Bullion III M.D   On: 02/24/2017 20:46     I have independently reviewed the above radiologic studies.  Recent Lab Findings: Lab Results  Component Value Date   WBC 13.5 (H) 02/24/2017   HGB 9.6 (L) 02/24/2017   HCT 29.7 (L) 02/24/2017   PLT 350 02/24/2017   GLUCOSE 103 (H) 02/23/2017   CHOL 114 02/14/2017   TRIG 51 02/14/2017   HDL 39 (L) 02/14/2017   LDLCALC 65 02/14/2017   NA 133 (L) 02/23/2017   K 3.6 02/23/2017   CL 96 (L) 02/23/2017   CREATININE 1.06 02/23/2017   BUN 23 (H) 02/23/2017   CO2 28 02/23/2017   TSH 1.836 02/14/2017   INR 1.62 02/14/2017   HGBA1C 5.5 02/14/2017      Assessment / Plan:   Postop cabg and stapling left lung bleb, severe emphysema  Weak deconditioned - now patient is readmitted for weakness , will need snf   [name] MD      [address] [place] [address] Office [phone]   Beeper [phone]  02/24/2017 9:03 PM

## 2017-02-24 NOTE — Progress Notes (Signed)
CARDIAC REHAB PHASE I   PRE:  Rate/Rhythm: 86 Sr  BP:  Supine:   Sitting: 94/54  Standing:    SaO2: 94%1L  MODE:  Ambulation: 200 ft   POST:  Rate/Rhythm: 102 ST   BP:  Supine:   Sitting: 115/56  Standing:    SaO2: 90-92%RA whole walk monitired 1418-1445 Pt walked 200 ft on RA with rolling walker with sats monitored whole walk. Sats maintained 90-92% on RA even though pt appeared dyspneic. Stopped several times to rest. Stated he did better this morning. Not feeling as well now. To bed after walk. Encouraged pursed lip breathing during walk. Son to be here soon for ed.   Graylon Good, RN BSN  02/24/2017 2:43 PM

## 2017-02-24 NOTE — Progress Notes (Signed)
Chest tube sutures removed as ordered, well tolerated.

## 2017-02-24 NOTE — Progress Notes (Addendum)
Patient in a stable condition, discharge education completed with patient and son , they verbalized understanding, iv removed, tele dc ccmd notified, patient belongings at bedside, patients son to transport patient home, home oxygen and 3 in 1 delivered to patient's room, patient transported home by his son

## 2017-02-24 NOTE — ED Notes (Signed)
Called patient placement. Thoracic surgery wants patient placed on 2W in circle.  Patient placement will hold bed until doctor places order.

## 2017-02-24 NOTE — ED Provider Notes (Signed)
[facility] DEPT Provider Note   CSN: [id number] Arrival date & time: 02/24/17  1926     History   Chief Complaint Chief Complaint  Patient presents with  . Near Syncope    HPI Thomas Trujillo is a 71 y.o. male.She had near syncopal event as she was stepping out of his car on the way home from the hospital or tonight. He reports that he got weak in his legs and almost went to the ground. He did not lose consciousness. He was brought by EMS. No treatment prior to coming here he has no complaint except for mild soreness at his sternotomy wound. No shortness of breath. No other associated symptoms  HPI  Past Medical History:  Diagnosis Date  . Patient denies medical problems     Patient Active Problem List   Diagnosis Date Noted  . 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 NSTEMI (non-ST elevated myocardial infarction) ([place]) 02/13/2017    Past Surgical History:  Procedure Laterality Date  . ABDOMINAL HYSTERECTOMY    . CATARACT EXTRACTION, BILATERAL    . CORONARY ARTERY BYPASS GRAFT N/A 02/14/2017   Procedure: CORONARY ARTERY BYPASS GRAFTING (CABG) x 2 , using left internal mammary artery and right leg greater saphenous vein harvested endoscopically LIMA-LAD, SVG-RAMUS;  Surgeon: [name], MD;  Location: [facility];  Service: Open Heart Surgery;  Laterality: N/A;  . ENDOVEIN HARVEST OF GREATER SAPHENOUS VEIN Right 02/14/2017   Procedure: ENDOVEIN HARVEST OF RIGHT THIGH GREATER SAPHENOUS VEIN;  Surgeon: [name], MD;  Location: [facility];  Service: Open Heart Surgery;  Laterality: Right;  . LEFT HEART CATH AND CORONARY ANGIOGRAPHY N/A 02/14/2017   Procedure: Left Heart Cath and Coronary Angiography;  Surgeon: [name], MD;  Location: [facility];  Service: Cardiovascular;  Laterality: N/A;  . NASAL POLYP SURGERY    . STAPLING OF BLEBS N/A 02/14/2017   Procedure: STAPLING OF LARGE LEFT UPPER LOBE PULMONARY BLEB;  Surgeon: [name], MD;  Location: [facility];  Service:  Open Heart Surgery;  Laterality: N/A;  . TEE WITHOUT CARDIOVERSION N/A 02/14/2017   Procedure: TRANSESOPHAGEAL ECHOCARDIOGRAM (TEE);  Surgeon: [name], MD;  Location: [facility];  Service: Open Heart Surgery;  Laterality: N/A;       Home Medications    Prior to Admission medications   Medication Sig Start Date End Date Taking? Authorizing Provider  acetaminophen (TYLENOL) 500 MG tablet Take 500 mg by mouth every 6 (six) hours as needed.    Historical Provider, MD  aspirin 325 MG EC tablet Take 1 tablet (325 mg total) by mouth daily. 02/25/17   Elgie Collard, PA-C  aspirin-sod bicarb-citric acid (ALKA-SELTZER) 325 MG TBEF tablet Take 325 mg by mouth every 6 (six) hours as needed.    Historical Provider, MD  atorvastatin (LIPITOR) 80 MG tablet Take 1 tablet (80 mg total) by mouth daily at 6 PM. 02/24/17   Elgie Collard, PA-C  furosemide (LASIX) 40 MG tablet Take 1 tablet (40 mg total) by mouth daily. 02/25/17 03/02/17  Elgie Collard, PA-C  Ipratropium-Albuterol (COMBIVENT) 20-100 MCG/ACT AERS respimat Inhale 1 puff into the lungs every 6 (six) hours as needed for wheezing. 02/24/17   Elgie Collard, PA-C  metoprolol tartrate (LOPRESSOR) 25 MG tablet Take 1 tablet (25 mg total) by mouth 2 (two) times daily. 02/24/17   Elgie Collard, PA-C  oxyCODONE (OXY IR/ROXICODONE) 5 MG immediate release tablet Take 1 tablet (5 mg total) by  mouth every 4 (four) hours as needed for severe pain. 02/24/17   Elgie Collard, PA-C  potassium chloride SA (K-DUR,KLOR-CON) 20 MEQ tablet Take 1 tablet (20 mEq total) by mouth daily. 02/25/17 03/02/17  Elgie Collard, PA-C  tamsulosin (FLOMAX) 0.4 MG CAPS capsule Take 1 capsule (0.4 mg total) by mouth daily. 02/25/17   Elgie Collard, PA-C    Family History Family History  Problem Relation Age of Onset  . Obesity Son     Social History Social History  Substance Use Topics  . Smoking status: Former Research scientist (life sciences)  . Smokeless tobacco: Never Used  . Alcohol use No  No illicit drug  use   Allergies   No known allergies   Review of Systems Review of Systems  HENT: Negative.   Respiratory: Negative.   Cardiovascular: Positive for chest pain.       Mild discomfort at sternotomy wound  Gastrointestinal: Negative.   Musculoskeletal: Negative.   Skin: Negative.   Neurological: Positive for weakness.       Generalized weakness  Psychiatric/Behavioral: Negative.   All other systems reviewed and are negative.    Physical Exam Updated Vital Signs BP (!) 96/54   Pulse 90   Resp (!) 22   Ht 5\' 5"  (1.651 m)   Wt 150 lb (68 kg)   SpO2 94%   BMI 24.96 kg/m   Physical Exam  Constitutional: He is oriented to person, place, and time. No distress.  Mildly ill-appearing. Nontoxic  HENT:  Head: Normocephalic and atraumatic.  Eyes: Conjunctivae are normal. Pupils are equal, round, and reactive to light.  Neck: Neck supple. No tracheal deviation present. No thyromegaly present.  Cardiovascular: Normal rate and regular rhythm.   No murmur heard. Well-healing sternotomy wound  Pulmonary/Chest: Effort normal.  Minimal end expiratory wheeze  Abdominal: Soft. Bowel sounds are normal. He exhibits no distension. There is no tenderness.  Musculoskeletal: Normal range of motion. He exhibits no edema or tenderness.  Neurological: He is alert and oriented to person, place, and time. Coordination normal.  Skin: Skin is warm and dry. No rash noted.  Psychiatric: He has a normal mood and affect.  Nursing note and vitals reviewed.    ED Treatments / Results  Labs (all labs ordered are listed, but only abnormal results are displayed) Labs Reviewed  CBG MONITORING, ED - Abnormal; Notable for the following:       Result Value   Glucose-Capillary 115 (*)    All other components within normal limits  BASIC METABOLIC PANEL  CBC  URINALYSIS, ROUTINE W REFLEX MICROSCOPIC  I-STAT TROPOININ, ED    EKG  EKG Interpretation  Date/Time:  Friday Feb 24 2017 19:26:57  EDT Ventricular Rate:  89 PR Interval:    QRS Duration: 92 QT Interval:  411 QTC Calculation: 501 R Axis:   -3 Text Interpretation:  Sinus rhythm Anterior infarct, age indeterminate Prolonged QT interval No significant change since last tracing Confirmed by Winfred Leeds  MD, Sydni Elizarraraz [phone]) on 02/24/2017 7:48:52 PM       Radiology No results found.  Procedures Procedures (including critical care time)  Medications Ordered in ED Medications  sodium chloride 0.9 % bolus 1,000 mL (not administered)    Results for orders placed or performed during the hospital encounter of 02/24/17  CBC  Result Value Ref Range   WBC 13.5 (H) 4.0 - 10.5 K/uL   RBC 3.20 (L) 4.22 - 5.81 MIL/uL   Hemoglobin 9.6 (L) 13.0 - 17.0 g/dL  HCT 29.7 (L) 39.0 - 52.0 %   MCV 92.8 78.0 - 100.0 fL   MCH 30.0 26.0 - 34.0 pg   MCHC 32.3 30.0 - 36.0 g/dL   RDW 14.6 11.5 - 15.5 %   Platelets 350 150 - 400 K/uL  Urinalysis, Routine w reflex microscopic  Result Value Ref Range   Color, Urine YELLOW YELLOW   APPearance CLEAR CLEAR   Specific Gravity, Urine 1.010 1.005 - 1.030   pH 5.0 5.0 - 8.0   Glucose, UA NEGATIVE NEGATIVE mg/dL   Hgb urine dipstick NEGATIVE NEGATIVE   Bilirubin Urine NEGATIVE NEGATIVE   Ketones, ur NEGATIVE NEGATIVE mg/dL   Protein, ur NEGATIVE NEGATIVE mg/dL   Nitrite NEGATIVE NEGATIVE   Leukocytes, UA NEGATIVE NEGATIVE  CBG monitoring, ED  Result Value Ref Range   Glucose-Capillary 115 (H) 65 - 99 mg/dL  I-stat troponin, ED  Result Value Ref Range   Troponin i, poc 0.13 (HH) 0.00 - 0.08 ng/mL   Comment NOTIFIED PHYSICIAN    Comment 3           Dg Chest 2 View  Result Date: 02/21/2017 CLINICAL DATA:  Bypass surgery. EXAM: CHEST  2 VIEW COMPARISON:  02/20/2017.  02/19/2017.  02/18/2017.  02/13/2017 . FINDINGS: Prior CABG. Heart size normal. Interim improvement of bilateral pulmonary infiltrates. Interstitial prominence noted bilaterally most likely related to chronic interstitial lung  disease. COPD . Scratched Basal pleural-parenchymal thickening noted most likely scarring. Small bilateral pleural effusions. No pneumothorax. IMPRESSION: 1.  Interim improvement of bilateral pulmonary infiltrates . 2. COPD. Interstitial prominence noted most likely related to chronic interstitial lung disease. Bilateral pleural-parenchymal scarring. Small bilateral pleural effusions. Electronically Signed   By: Marcello Moores  Register   On: 02/21/2017 08:00   Dg Chest Port 1 View  Result Date: 02/24/2017 CLINICAL DATA:  Near syncope. EXAM: PORTABLE CHEST 1 VIEW COMPARISON:  Feb 21, 2017 FINDINGS: No pneumothorax. A small left effusion is identified. Bibasilar opacities are seen, stable on the left and increased in the medial right base in the interval. The cardiomediastinal silhouette is stable. IMPRESSION: The opacity in the medial right lung base has increased in the interval. There is probably a tiny right effusion. A tiny left effusion and underlying opacity on the left is stable. Recommend follow-up to resolution. Electronically Signed   By: Dorise Bullion III M.D   On: 02/24/2017 20:46   Dg Chest Port 1 View  Result Date: 02/20/2017 CLINICAL DATA:  Shortness of breath and wheezing.  History of CABG. EXAM: PORTABLE CHEST 1 VIEW COMPARISON:  Chest radiograph February 19, 2017 FINDINGS: Cardiomediastinal silhouette is normal, status post median sternotomy for CABG. Severe bullous changes with chronic interstitial changes increased lung volumes. Small pleural effusions and bibasilar airspace opacities. No pneumothorax. Osteopenia. Soft tissue planes are nonsuspicious. IMPRESSION: COPD. Small pleural effusions and bibasilar atelectasis, or pneumonia. Followup PA and lateral chest X-ray is recommended in 3-4 weeks following trial of antibiotic therapy to ensure resolution and exclude underlying malignancy. Electronically Signed   By: Elon Alas M.D.   On: 02/20/2017 03:22   Dg Chest Port 1 View  Result  Date: 02/19/2017 CLINICAL DATA:  Status post coronary artery bypass grafting. Recent pneumothorax. EXAM: PORTABLE CHEST 1 VIEW COMPARISON:  February 18, 2017 FINDINGS: The previously noted basilar pneumothorax on the left is no longer evident. There is patchy atelectasis in the left base. There is mild scarring in the right base. There are rather minimal pleural effusions bilaterally. There is bullous  disease in the upper lobes. There is no new opacity. Heart size is normal. Pulmonary vascularity reflects the underlying bullous disease. There is aortic atherosclerosis. Patient is status post coronary artery bypass grafting. IMPRESSION: Previously noted left base pneumothorax no longer evident. Patchy atelectatic change in the left base remains. There is a degree of underlying COPD. There is scarring in the right base. No new opacity. Stable cardiac silhouette. There is aortic atherosclerosis. Electronically Signed   By: Lowella Grip III M.D.   On: 02/19/2017 07:24   Dg Chest Port 1 View  Result Date: 02/18/2017 CLINICAL DATA:  Worsening dyspnea. EXAM: PORTABLE CHEST 1 VIEW COMPARISON:  Chest radiograph February 16, 2017 FINDINGS: Interval removal of LEFT chest tube with small LEFT lung base pneumothorax. Cardiomediastinal silhouette is normal, status post median sternotomy. Mildly calcified aortic knob. Similar patchy bibasilar airspace opacities. Increased lung volumes. Blunting of the RIGHT costophrenic angle. Soft tissue planes and included osseous structures are nonsuspicious. IMPRESSION: Interval removal of LEFT chest tube with small LEFT pneumothorax. Similar bibasilar airspace opacities and small RIGHT pleural effusion. COPD. These results will be called to the ordering clinician or representative by the Radiologist Assistant, and communication documented in the [facility] Dashboard. Electronically Signed   By: Elon Alas M.D.   On: 02/18/2017 05:05   Dg Chest Port 1 View  Result Date:  02/16/2017 CLINICAL DATA:  Chest tube after CABG EXAM: PORTABLE CHEST 1 VIEW COMPARISON:  Yesterday FINDINGS: Swan-Ganz catheter and midline thoracic drain have been removed. There is still left-sided chest tube. No visible pneumothorax. Status post CABG. Stable normal heart size. Advanced emphysema with layering pleural effusions and atelectasis. Interstitial coarsening in the lower lungs. IMPRESSION: 1. Left chest tube without visible pneumothorax. 2. Layering pleural effusions, more apparent today. 3. Lower zone interstitial coarsening, suspect atypical edema due to advanced emphysema. Electronically Signed   By: Monte Fantasia M.D.   On: 02/16/2017 07:49   Dg Chest Port 1 View  Result Date: 02/15/2017 CLINICAL DATA:  Status post coronary artery bypass grafting. Chest tube in place EXAM: PORTABLE CHEST 1 VIEW COMPARISON:  February 14, 2017 FINDINGS: Endotracheal tube and nasogastric tube have been removed. Swan-Ganz catheter tip is in the main pulmonary outflow tract. There is a left chest tube and mediastinal drain, unchanged. Temporary pacemaker wires remain attached to the right heart. No pneumothorax. There is upper lobe bullous disease. There is patchy opacity in the lower lung zones bilaterally, new from prior study. Suspect pulmonary edema as most likely etiology. There is stable scarring in the bases. Heart is upper normal in size. The pulmonary vascularity is stable and overall within normal limits given underlying bullous disease. No adenopathy. There is aortic atherosclerosis. IMPRESSION: Tube and catheter positions as described without pneumothorax. New opacity in the lung bases. Suspect pulmonary edema, although pneumonia could present in this manner. Both pneumonia and pulmonary edema may be present concurrently. The cardiac silhouette is unchanged. Underlying COPD. There is aortic atherosclerosis. Electronically Signed   By: Lowella Grip III M.D.   On: 02/15/2017 07:47   Dg Chest Port 1  View  Result Date: 02/14/2017 CLINICAL DATA:  Postoperative radiograph, status post CABG. Initial encounter. EXAM: PORTABLE CHEST 1 VIEW COMPARISON:  Chest radiograph performed 02/13/2017 FINDINGS: The patient's endotracheal tube is seen ending 4 cm above the carina. An enteric tube is noted ending overlying the fundus of the stomach. The right IJ Swan-Ganz catheter is noted ending about the pulmonary outflow tract. A left-sided chest tube is  noted. No definite pneumothorax is seen. Mild vascular congestion is noted. Underlying bullous change is noted bilaterally. No pleural effusion is identified. The cardiomediastinal silhouette is borderline normal in size. The patient is status post median sternotomy, with evidence of CABG. No acute osseous abnormalities are identified. IMPRESSION: 1. Endotracheal tube seen ending 4 cm above the carina. Remaining tubes and lines as described above. 2. Mild vascular congestion noted.  Lungs remain grossly clear. 3. Underlying bilateral emphysematous bulla noted. Electronically Signed   By: Garald Balding M.D.   On: 02/14/2017 18:44   Dg Chest Port 1 View  Result Date: 02/13/2017 CLINICAL DATA:  80-year-old male could chest pain. EXAM: PORTABLE CHEST 1 VIEW COMPARISON:  None. FINDINGS: There is emphysematous changes of the lungs with areas of bullous appearing in the upper lobes. There is hyperexpansion of the lungs with bilateral flattening of the diaphragms. Bilateral mid to lower lung field linear densities most consistent with atelectasis/scarring. There is no focal consolidation, pleural effusion, or pneumothorax. The cardiac silhouette is within normal limits. There is atherosclerotic calcification of the aortic arch. No acute osseous pathology. IMPRESSION: 1. No acute cardiopulmonary process. 2. Emphysema. Electronically Signed   By: Anner Crete M.D.   On: 02/13/2017 23:01   Initial Impression / Assessment and Plan / ED Course  I have reviewed the triage vital  signs and the nursing notes.  Pertinent labs & imaging results that were available during my care of the patient were reviewed by me and considered in my medical decision making (see chart for details).     9 PM Patient resting comfortably after treatment with intravenous fluids. Dr.Gerhardt consulted and arranged for overnight stay Chest x-ray viewed by me Document pneumonia. No cough no fever. Anemia is stable Final Clinical Impressions(s) / ED Diagnoses  Diagnosis near syncope #2 anemia #3 hypotension Final diagnoses:  None    New Prescriptions New Prescriptions   No medications on file     Orlie Dakin, MD 02/24/17 2105

## 2017-02-25 LAB — CBC
HCT: 27.4 % — ABNORMAL LOW (ref 39.0–52.0)
Hemoglobin: 8.9 g/dL — ABNORMAL LOW (ref 13.0–17.0)
MCH: 30.3 pg (ref 26.0–34.0)
MCHC: 32.5 g/dL (ref 30.0–36.0)
MCV: 93.2 fL (ref 78.0–100.0)
Platelets: 347 10*3/uL (ref 150–400)
RBC: 2.94 MIL/uL — ABNORMAL LOW (ref 4.22–5.81)
RDW: 14.6 % (ref 11.5–15.5)
WBC: 11.5 10*3/uL — ABNORMAL HIGH (ref 4.0–10.5)

## 2017-02-25 LAB — BASIC METABOLIC PANEL
Anion gap: 11 (ref 5–15)
BUN: 21 mg/dL — ABNORMAL HIGH (ref 6–20)
CO2: 28 mmol/L (ref 22–32)
Calcium: 8.2 mg/dL — ABNORMAL LOW (ref 8.9–10.3)
Chloride: 94 mmol/L — ABNORMAL LOW (ref 101–111)
Creatinine, Ser: 1.11 mg/dL (ref 0.61–1.24)
GFR calc Af Amer: >60 mL/min (ref >60)
GFR calc non Af Amer: >60 mL/min (ref >60)
Glucose, Bld: 101 mg/dL — ABNORMAL HIGH (ref 65–99)
Potassium: 3.9 mmol/L (ref 3.5–5.1)
Sodium: 133 mmol/L — ABNORMAL LOW (ref 135–145)

## 2017-02-25 MED ORDER — ENOXAPARIN SODIUM 30 MG/0.3ML ~~LOC~~ SOLN
30.0000 mg | SUBCUTANEOUS | Status: DC
  Administered 2017-02-25 – 2017-02-26 (×2): 30 mg via SUBCUTANEOUS
  Filled 2017-02-25 (×2): qty 0.3

## 2017-02-25 MED ORDER — METOPROLOL TARTRATE 12.5 MG HALF TABLET
12.5000 mg | ORAL_TABLET | Freq: Two times a day (BID) | ORAL | Status: DC
  Administered 2017-02-25: 12.5 mg via ORAL
  Filled 2017-02-25 (×2): qty 1

## 2017-02-25 NOTE — Evaluation (Addendum)
Physical Therapy Evaluation Patient Details Name: Thomas Trujillo MRN: [id number] DOB: 05/23/1946 Today's Date: 02/25/2017   History of Present Illness  Patient is a 71 y/o male admitted with NSTEMI now s/p CABG x 2 D/C 5/4 with readmit same day due to syncope.  PMH positive for cataract excision, nasal polyp surgery.    Clinical Impression  Pt pleasant although states he is hungry from not eating since lunch. Pt states he had his oxygen off briefly on return home and stood quickly to get out of car and simply collapsed. Pt continues to demonstrated decreased activity tolerance and gait who will benefit from acute therapy to maximize independence and function with continued plan to return home with HHPT and son.   BP 101/54 pre, 115/46 post ambulation sats 88% on RA with 2L at rest 94%, pt ambulated on 2L and did drop to 88% end of session with quick rebound in sitting Hr 80-102    Follow Up Recommendations Home health PT;Supervision/Assistance - 24 hour    Equipment Recommendations  None recommended by PT    Recommendations for Other Services       Precautions / Restrictions Precautions Precautions: Sternal;Fall Precaution Comments: watch O2 sats Restrictions Weight Bearing Restrictions: Yes      Mobility  Bed Mobility Overal bed mobility: Modified Independent Bed Mobility: Rolling;Sidelying to Sit              Transfers Overall transfer level: Needs assistance   Transfers: Sit to/from Stand Sit to Stand: Supervision         General transfer comment: pt needing cues for hand placement today due to maintaining hands on RW with sit and stand  Ambulation/Gait Ambulation/Gait assistance: Supervision Ambulation Distance (Feet): 450 Feet Assistive device: Rolling walker (2 wheeled) Gait Pattern/deviations: Step-through pattern;Decreased stride length;Trunk flexed Gait velocity: 70ft/15 sec = 1.33 Gait velocity interpretation: <1.8 ft/sec, indicative of risk for  recurrent falls General Gait Details: cues for posture. Slow steady gait with 2L  Stairs            Wheelchair Mobility    Modified Rankin (Stroke Patients Only)       Balance                                             Pertinent Vitals/Pain Pain Assessment: No/denies pain    Home Living Family/patient expects to be discharged to:: Private residence Living Arrangements: Alone Available Help at Discharge: Family;Available 24 hours/day Type of Home: Apartment Home Access: Level entry     Home Layout: One level Home Equipment: Grab bars - tub/shower;Walker - 2 wheels;Bedside commode Additional Comments: son can stay with him initially    Prior Function Level of Independence: Independent         Comments: drives; has only been in [place] for 18 months     Hand Dominance        Extremity/Trunk Assessment   Upper Extremity Assessment Upper Extremity Assessment: Overall WFL for tasks assessed    Lower Extremity Assessment Lower Extremity Assessment: Overall WFL for tasks assessed    Cervical / Trunk Assessment Cervical / Trunk Assessment: Normal  Communication   Communication: No difficulties  Cognition Arousal/Alertness: Awake/alert Behavior During Therapy: WFL for tasks assessed/performed Overall Cognitive Status: Within Functional Limits for tasks assessed  General Comments      Exercises     Assessment/Plan    PT Assessment Patient needs continued PT services  PT Problem List Decreased activity tolerance;Decreased knowledge of use of DME;Decreased knowledge of precautions;Decreased mobility       PT Treatment Interventions DME instruction;Gait training;Therapeutic exercise;Patient/family education;Therapeutic activities;Balance training;Functional mobility training    PT Goals (Current goals can be found in the Care Plan section)  Acute Rehab PT Goals Patient Stated  Goal: To return home PT Goal Formulation: With patient Time For Goal Achievement: 03/04/17 Potential to Achieve Goals: Good    Frequency Min 3X/week   Barriers to discharge        Co-evaluation               AM-PAC PT "6 Clicks" Daily Activity  Outcome Measure Difficulty turning over in bed (including adjusting bedclothes, sheets and blankets)?: None Difficulty moving from lying on back to sitting on the side of the bed? : A Little Difficulty sitting down on and standing up from a chair with arms (e.g., wheelchair, bedside commode, etc,.)?: None Help needed moving to and from a bed to chair (including a wheelchair)?: None Help needed walking in hospital room?: A Little Help needed climbing 3-5 steps with a railing? : A Little 6 Click Score: 21    End of Session Equipment Utilized During Treatment: Oxygen Activity Tolerance: Patient tolerated treatment well Patient left: in chair;with call bell/phone within reach Nurse Communication: Mobility status PT Visit Diagnosis: Difficulty in walking, not elsewhere classified (R26.2)    Time: 0518-3358 PT Time Calculation (min) (ACUTE ONLY): 25 min   Charges:   PT Evaluation $PT Eval Moderate Complexity: 1 Procedure PT Treatments $Gait Training: 8-22 mins   PT G Codes:        Elwyn Reach, PT [phone]   [place] 02/25/2017, 11:04 AM

## 2017-02-25 NOTE — Clinical Social Work Note (Signed)
Pt has been [facility], son is agreeable to this placement.  Pt will not be able to transfer to facility today due to new med for BP and MD wanting to monitor pt  For 24 hrs, son updated.  [facility] is aware pt will likely come tomorrow and bed will be available for pt.  CSW will continue to follow and coordinate a safe DC to facility.

## 2017-02-25 NOTE — Clinical Social Work Placement (Signed)
   CLINICAL SOCIAL WORK PLACEMENT  NOTE  Date:  02/25/2017  Patient Details  Name: Thomas Trujillo MRN: [id number] Date of Birth: Nov 15, 1945  Clinical Social Work is seeking post-discharge placement for this patient at the [place] level of care (*CSW will initial, date and re-position this form in  chart as items are completed):  Yes   Patient/family provided with [facility] Work Department's list of facilities offering this level of care within the geographic area requested by the patient (or if unable, by the patient's family).  Yes   Patient/family informed of their freedom to choose among providers that offer the needed level of care, that participate in Medicare, Medicaid or managed care program needed by the patient, have an available bed and are willing to accept the patient.  Yes   Patient/family informed of [facility]'s ownership interest in [facility] and [facility], as well as of the fact that they are under no obligation to receive care at these facilities.  PASRR submitted to EDS on 02/25/17     PASRR number received on 02/25/17     Existing PASRR number confirmed on       FL2 transmitted to all facilities in geographic area requested by pt/family on 02/25/17     FL2 transmitted to all facilities within larger geographic area on       Patient informed that his/her managed care company has contracts with or will negotiate with certain facilities, including the following:            Patient/family informed of bed offers received.  Patient chooses bed at       Physician recommends and patient chooses bed at      Patient to be transferred to   on  .  Patient to be transferred to facility by       Patient family notified on   of transfer.  Name of family member notified:        PHYSICIAN Please sign FL2, Please prepare prescriptions     Additional Comment:    _______________________________________________ Serafina Mitchell, LCSWA 02/25/2017, 10:48 AM

## 2017-02-25 NOTE — Progress Notes (Signed)
CM received call from cardiologist requesting placement for pt who was readmitted with syncopal event (within same day as discharge from an inpatient stay (over three midnights).  CM referred this consult to [place] who states she will follow.  This CM will also follow for discharge until DC plan is secured.

## 2017-02-25 NOTE — Progress Notes (Addendum)
      [address]       [place] [address]             [phone]         Subjective: Thomas Trujillo this morning without issue. He feels much better this morning.   Objective: Vital signs in last 24 hours: Temp:  [97.7 F (36.5 C)-98.3 F (36.8 C)] 98.2 F (36.8 C) (05/05 0917) Pulse Rate:  [86-94] 93 (05/05 0917) Cardiac Rhythm: Normal sinus rhythm (05/04 2301) Resp:  [17-23] 20 (05/05 0917) BP: (92-112)/(46-62) 96/48 (05/05 0917) SpO2:  [90 %-98 %] 95 % (05/05 0917) FiO2 (%):  [21 %] 21 % (05/04 1440) Weight:  [68 kg (150 lb)-68.5 kg (151 lb)] 68.5 kg (151 lb) (05/05 0500)     Intake/Output from previous day: 05/04 0701 - 05/05 0700 In: 1000 [IV Piggyback:1000] Out: 375 [Urine:375] Intake/Output this shift: No intake/output data recorded.  General appearance: alert, cooperative and no distress Heart: regular rate and rhythm, S1, S2 normal, no murmur, click, rub or gallop Lungs: clear to auscultation bilaterally Abdomen: soft, non-tender; bowel sounds normal; no masses,  no organomegaly Extremities: extremities normal, atraumatic, no cyanosis or edema Wound: clean and dry without drainage  Lab Results:  Recent Labs  02/24/17 1959 02/25/17 0246  WBC 13.5* 11.5*  HGB 9.6* 8.9*  HCT 29.7* 27.4*  PLT 350 347   BMET:  Recent Labs  02/24/17 1959 02/25/17 0246  NA 128* 133*  K 3.6 3.9  CL 88* 94*  CO2 27 28  GLUCOSE 115* 101*  BUN 23* 21*  CREATININE 1.19 1.11  CALCIUM 8.2* 8.2*    PT/INR: No results for input(s): LABPROT, INR in the last 72 hours. ABG    Component Value Date/Time   PHART 7.393 02/18/2017 0422   HCO3 30.5 (H) 02/18/2017 0422   TCO2 30 02/18/2017 0503   ACIDBASEDEF 2.0 02/15/2017 0445   O2SAT 98.0 02/18/2017 0422   CBG (last 3)   Recent Labs  02/24/17 1943  GLUCAP 115*    Assessment/Plan:  1. CV-NSR, Rate in the 90s- continue Lopressor at 25 mg BID, watch BP since soft this morning.  2. Pulm- + COPD,  continue aggressive pulmonary toilet, wean oxygen if able. DuoNebs ordered. 3. Renal- creatinine has been stable, weight continues to trend down and well below baseline. Continue PO lasix.  4. Expected blood loss anemia- Hgb has been stable 5. Deconditioning- PT/OT, inpatient rehab placement 6. Dispo- patient stable, continue to wean oxygen as tolerated. Will consult case management and social work. Aggressive PT/OT.     LOS: 1 day    Elgie Collard 02/25/2017  I have seen and examined Thomas Trujillo and agree with the above assessment  and plan.  [name] MD Beeper [phone] Office [phone] 02/25/2017 11:50 AM

## 2017-02-25 NOTE — Clinical Social Work Note (Signed)
CSW notified by [facility] pt now agreeable to SNF. CSW reviewed chart, previous note states that pt's Son would take pt home with 24 hr care. CSW contacted pt's Laird Runnion @ [phone] to discuss. Son agreeable to SNF and wants a facility near [place].   CSW faxed out to these areas and will await an answer. Pt has had a 3 day stay in the last 30 days.  CSW will continue to follow.  Lief Palmatier B. Joline Maxcy Clinical Social Work Dept Weekend Social Worker [phone] 10:51 AM

## 2017-02-25 NOTE — NC FL2 (Signed)
[place] LEVEL OF CARE SCREENING TOOL     IDENTIFICATION  Patient Name: Thomas Trujillo Birthdate: 06-17-1946 Sex: male Admission Date (Current Location): 02/24/2017  [facility] and [place] Number:  Herbalist and Address:  [facility], [place] [address], [place], [place] [address]      Provider Number: [id number]  Attending Physician Name and Address:  [name], MD  Relative Name and Phone Number:  Son [phone]    Current Level of Care: Hospital Recommended Level of Care: [place] Prior Approval Number:    Date Approved/Denied:   PASRR Number: [id number]  Discharge Plan: SNF    Current Diagnoses: Patient Active Problem List   Diagnosis Date Noted  . CAD (coronary artery disease) 02/24/2017  . 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 NSTEMI (non-ST elevated myocardial infarction) ([place]) 02/13/2017    Orientation RESPIRATION BLADDER Height & Weight     Self, Time, Situation, Place  Normal Continent Weight: 151 lb (68.5 kg) Height:  5\' 5"  (165.1 cm)  BEHAVIORAL SYMPTOMS/MOOD NEUROLOGICAL BOWEL NUTRITION STATUS      Continent Diet (See DC Summary)  AMBULATORY STATUS COMMUNICATION OF NEEDS Skin   Limited Assist Verbally Normal                       Personal Care Assistance Level of Assistance  Bathing, Dressing Bathing Assistance: Limited assistance   Dressing Assistance: Limited assistance     Functional Limitations Info  Hearing, Speech, Sight Sight Info: Adequate Hearing Info: Adequate Speech Info: Adequate    SPECIAL CARE FACTORS FREQUENCY  PT (By licensed PT), OT (By licensed OT)     PT Frequency: 5x wk OT Frequency: 5x wk            Contractures Contractures Info: Not present    Additional Factors Info  Code Status Code Status Info: Prior             Current Medications (02/25/2017):  This is the current hospital active medication list Current Facility-Administered  Medications  Medication Dose Route Frequency Provider Last Rate Last Dose  . 0.9 %  sodium chloride infusion  250 mL Intravenous PRN [name], MD      . acetaminophen (TYLENOL) tablet 650 mg  650 mg Oral Q6H PRN [name], MD      . aspirin EC tablet 325 mg  325 mg Oral Daily [name], MD   325 mg at 02/25/17 0920  . atorvastatin (LIPITOR) tablet 80 mg  80 mg Oral q1800 [name], MD   80 mg at 02/24/17 2321  . bisacodyl (DULCOLAX) EC tablet 10 mg  10 mg Oral Daily PRN [name], MD       Or  . bisacodyl (DULCOLAX) suppository 10 mg  10 mg Rectal Daily PRN [name], MD      . ipratropium-albuterol (DUONEB) 0.5-2.5 (3) MG/3ML nebulizer solution 3 mL  3 mL Nebulization Q6H PRN [name], MD   3 mL at 02/25/17 0513  . metoprolol tartrate (LOPRESSOR) tablet 25 mg  25 mg Oral BID [name], MD   25 mg at 02/24/17 2143  . ondansetron (ZOFRAN) tablet 4 mg  4 mg Oral Q6H PRN [name], MD       Or  . ondansetron [facility]) injection 4 mg  4 mg Intravenous Q6H PRN [name], MD      .  oxyCODONE (Oxy IR/ROXICODONE) immediate release tablet 5 mg  5 mg Oral Q4H PRN [name], MD      . sodium chloride flush (NS) 0.9 % injection 3 mL  3 mL Intravenous Q12H [name], MD   3 mL at 02/25/17 0920  . sodium chloride flush (NS) 0.9 % injection 3 mL  3 mL Intravenous PRN [name], MD      . tamsulosin [facility]) capsule 0.4 mg  0.4 mg Oral Daily [name], MD   0.4 mg at 02/25/17 0920  . traMADol (ULTRAM) tablet 50 mg  50 mg Oral Q6H PRN [name], MD         Discharge Medications: Please see discharge summary for a list of discharge medications.  Relevant Imaging Results:  Relevant Lab Results:   Additional Information [phone] Youssef Footman B, LCSWA

## 2017-02-26 MED ORDER — BISACODYL 5 MG PO TBEC: 10.0000 mg | DELAYED_RELEASE_TABLET | Freq: Every day | ORAL | 0 refills | Status: DC | PRN

## 2017-02-26 MED ORDER — POTASSIUM CHLORIDE CRYS ER 10 MEQ PO TBCR: 10.0000 meq | EXTENDED_RELEASE_TABLET | Freq: Every day | ORAL | 0 refills | Status: DC

## 2017-02-26 MED ORDER — FUROSEMIDE 20 MG PO TABS: 20.0000 mg | ORAL_TABLET | Freq: Every day | ORAL | 0 refills | Status: DC

## 2017-02-26 MED ORDER — ONDANSETRON HCL 4 MG PO TABS: 4.0000 mg | ORAL_TABLET | Freq: Four times a day (QID) | ORAL | 0 refills | Status: DC | PRN

## 2017-02-26 MED ORDER — OXYCODONE HCL 5 MG PO TABS: 5.0000 mg | ORAL_TABLET | ORAL | 0 refills | Status: DC | PRN

## 2017-02-26 MED ORDER — METOPROLOL TARTRATE 25 MG PO TABS
12.5000 mg | ORAL_TABLET | Freq: Two times a day (BID) | ORAL | 1 refills | Status: DC
Start: 2017-02-26 — End: 2017-02-28

## 2017-02-26 NOTE — Progress Notes (Addendum)
      [address]       [place] [address]             [phone]         Subjective: Lucia Gaskins that he became short of breath around 3am this morning which has happened the last few morning. He feels better after a nebulizer treatment.   Objective: Vital signs in last 24 hours: Temp:  [98.2 F (36.8 C)-98.8 F (37.1 C)] 98.7 F (37.1 C) (05/06 0934) Pulse Rate:  [89-100] 99 (05/06 0934) Cardiac Rhythm: Normal sinus rhythm (05/06 0719) Resp:  [18-20] 18 (05/06 0934) BP: (95-123)/(52-76) 103/76 (05/06 0934) SpO2:  [96 %-100 %] 96 % (05/06 0934) Weight:  [68.2 kg (150 lb 6.4 oz)] 68.2 kg (150 lb 6.4 oz) (05/06 0457)     Intake/Output from previous day: 05/05 0701 - 05/06 0700 In: 440 [P.O.:440] Out: 740 [Urine:740] Intake/Output this shift: Total I/O In: 240 [P.O.:240] Out: -   General appearance: alert, cooperative and no distress Heart: regular rate and rhythm, S1, S2 normal, no murmur, click, rub or gallop Lungs: clear to auscultation bilaterally Abdomen: soft, non-tender; bowel sounds normal; no masses,  no organomegaly Extremities: extremities normal, atraumatic, no cyanosis or edema Wound: clean and dry  Lab Results:  Recent Labs  02/24/17 1959 02/25/17 0246  WBC 13.5* 11.5*  HGB 9.6* 8.9*  HCT 29.7* 27.4*  PLT 350 347   BMET:  Recent Labs  02/24/17 1959 02/25/17 0246  NA 128* 133*  K 3.6 3.9  CL 88* 94*  CO2 27 28  GLUCOSE 115* 101*  BUN 23* 21*  CREATININE 1.19 1.11  CALCIUM 8.2* 8.2*    PT/INR: No results for input(s): LABPROT, INR in the last 72 hours. ABG    Component Value Date/Time   PHART 7.393 02/18/2017 0422   HCO3 30.5 (H) 02/18/2017 0422   TCO2 30 02/18/2017 0503   ACIDBASEDEF 2.0 02/15/2017 0445   O2SAT 98.0 02/18/2017 0422   CBG (last 3)   Recent Labs  02/24/17 1943  GLUCAP 115*    Assessment/Plan:  1. CV-NSR, Rate in the 90s- continue Lopressor at 12.5 mg BID, decreased yesterday with slight  improvement in BP 2. Pulm- + COPD, continue aggressive pulmonary toilet, wean oxygen if able. DuoNebs ordered. 3. Renal- creatinine has been stable, weight continues to trend down and well below baseline. Continue PO lasix.  4. Expected blood loss anemia- Hgb has been stable 5. Deconditioning- PT/OT, inpatient rehab placement 6. Dispo- patient stable, continue to wean oxygen as tolerated. Patient has been accepted to [facility]. Possible transfer today if attending agrees.    LOS: 2 days    Elgie Collard 02/26/2017  Feels better today , this time patient agreeable to go to snf, will require home  o2 On lower dose of lopressor I have seen and examined Theotis Barrio and agree with the above assessment  and plan.  [name] MD Beeper [phone] Office [phone] 02/26/2017 10:58 AM

## 2017-02-26 NOTE — Discharge Summary (Signed)
[address]       [place] [address]             [phone]      Physician Discharge Summary  Patient ID: Mitsuo Budnick MRN: [id number] DOB/AGE: Mar 01, 1946 71 y.o.  Admit date: 02/24/2017 Discharge date: 02/26/2017  Admission Diagnoses: Patient Active Problem List   Diagnosis Date Noted  . CAD (coronary artery disease) 02/24/2017  . 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 NSTEMI (non-ST elevated myocardial infarction) ([place]) 02/13/2017    Discharge Diagnoses:  Active Problems:   CAD (coronary artery disease)   Discharged Condition: good  HPI:  Mr. Trainer underwent a coronary artery bypass grafting 2 with Dr. Servando Snare on 02/15/2017. [name] He was extubated in a timely manner. He did have some nausea postop day 1 which resolved with medication. His renal function remained stable.  He remained in sinus rhythm. 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 His coronary artery bypass grafting surgery was done emergently so there was no pre op ABG on room air or PFTs. He will need to follow up with his medical doctor after discharge. [name] He is on 2 liters of oxygen via [place] and we will attempt to wean him to wean him to room air. His glucose has remained well controlled and his HGA1C pre op was 5.5. Accu checks and SS PRN were stopped on 05/01. He did have ABL anemia post op. He did not require a transfusion. His last H and H was 8.3 and 25.5. He was started on oral Ferrous sulfate and folic acid.   We had recommended he go to SNF but son wanted to take him home. He was d/c this afternoon , got home in [place] felt weak, lightheaded and called 911 and was brought back to ER at [facility] by ems. The patient said his legs collapsed but he was guided gently to the ground.   He returned to 2W step down.  Hospital Course: Mr. Curran returned to [facility] and has done well since re-admission.  Yesterday morning he walked without issue after his breathing treatment. His HR remained in NSR. We decreased his Lopressor due to hypotension. We continued with his gentle diuretic. He continued to require 2L [place]. He shared that around 3am he became short of breath but this resolved with a breathing treatment. His blood pressure slightly improved. He does not have any lightheadedness or dizziness with ambulation. He feels well today and is ready for discharge to [facility].   Consults: social work and case management  Significant Diagnostic Studies:  CLINICAL DATA:  Near syncope.  EXAM: PORTABLE CHEST 1 VIEW  COMPARISON:  Feb 21, 2017  FINDINGS: No pneumothorax. A small left effusion is identified. Bibasilar opacities are seen, stable on the left and increased in the medial right base in the interval. The cardiomediastinal silhouette is stable.  IMPRESSION: The opacity in the medial right lung base has increased in the interval. There is probably a tiny right effusion. A tiny left effusion and underlying opacity on the left is stable. Recommend follow-up to resolution.   Electronically Signed   By: Dorise Bullion III M.D   On: 02/24/2017 20:46  Treatments: Decreased BB. Cardiac rehab. Medical management.  Discharge Exam: Blood pressure 103/76, pulse 99, temperature 98.7 F (37.1 C), temperature source Oral, resp. rate 18, height 5\' 5"  (1.651 m), weight 68.2  kg (150 lb 6.4 oz), SpO2 96 %.  General appearance: alert, cooperative and no distress Heart: regular rate and rhythm, S1, S2 normal, no murmur, click, rub or gallop Lungs: clear to auscultation bilaterally Abdomen: soft, non-tender; bowel sounds normal; no masses,  no organomegaly Extremities: extremities normal, atraumatic, no cyanosis or edema Wound: clean and dry   Disposition: 01-Home or Self Care  Discharge Instructions    Discharge patient    Complete by:  As directed    Discharge disposition:   03-Skilled [place]   Discharge patient date:  02/26/2017   For home use only DME oxygen    Complete by:  As directed    Mode or (Route):  Nasal cannula   Liters per Minute:  2   Frequency:  Continuous (stationary and portable oxygen unit needed)   Oxygen delivery system:  Gas     Allergies as of 02/26/2017      Reactions   No Known Allergies       Medication List    STOP taking these medications   aspirin-sod bicarb-citric acid 325 MG Tbef tablet Commonly known as:  ALKA-SELTZER     TAKE these medications   acetaminophen 500 MG tablet Commonly known as:  TYLENOL Take 500 mg by mouth every 6 (six) hours as needed.   aspirin 325 MG EC tablet Take 1 tablet (325 mg total) by mouth daily.   atorvastatin 80 MG tablet Commonly known as:  LIPITOR Take 1 tablet (80 mg total) by mouth daily at 6 PM.   bisacodyl 5 MG EC tablet Commonly known as:  DULCOLAX Take 2 tablets (10 mg total) by mouth daily as needed for moderate constipation (Give daily if no BM).   furosemide 20 MG tablet Commonly known as:  LASIX Take 1 tablet (20 mg total) by mouth daily. What changed:  medication strength  how much to take   Ipratropium-Albuterol 20-100 MCG/ACT Aers respimat Commonly known as:  COMBIVENT Inhale 1 puff into the lungs every 6 (six) hours as needed for wheezing.   metoprolol tartrate 25 MG tablet Commonly known as:  LOPRESSOR Take 0.5 tablets (12.5 mg total) by mouth 2 (two) times daily. What changed:  how much to take   ondansetron 4 MG tablet Commonly known as:  ZOFRAN Take 1 tablet (4 mg total) by mouth every 6 (six) hours as needed for nausea.   oxyCODONE 5 MG immediate release tablet Commonly known as:  Oxy IR/ROXICODONE Take 1 tablet (5 mg total) by mouth every 4 (four) hours as needed for severe pain.   potassium chloride 10 MEQ tablet Commonly known as:  K-DUR,KLOR-CON Take 1 tablet (10 mEq total) by mouth daily. What changed:  medication  strength  how much to take   tamsulosin 0.4 MG Caps capsule Commonly known as:  FLOMAX Take 1 capsule (0.4 mg total) by mouth daily.            Durable Medical Equipment        Start     Ordered   02/26/17 0000  For home use only DME oxygen    Question Answer Comment  Mode or (Route) Nasal cannula   Liters per Minute 2   Frequency Continuous (stationary and portable oxygen unit needed)   Oxygen delivery system Gas      02/26/17 1023       Signed: Elgie Collard 02/26/2017, 10:25 AM

## 2017-02-26 NOTE — Clinical Social Work Placement (Signed)
   CLINICAL SOCIAL WORK PLACEMENT  NOTE  Date:  02/26/2017  Patient Details  Name: Thomas Trujillo MRN: [id number] Date of Birth: April 29, 1946  Clinical Social Work is seeking post-discharge placement for this patient at the [place] level of care (*CSW will initial, date and re-position this form in  chart as items are completed):  Yes   Patient/family provided with [place] Work Department's list of facilities offering this level of care within the geographic area requested by the patient (or if unable, by the patient's family).  Yes   Patient/family informed of their freedom to choose among providers that offer the needed level of care, that participate in Medicare, Medicaid or managed care program needed by the patient, have an available bed and are willing to accept the patient.  Yes   Patient/family informed of [facility]'s ownership interest in [facility] and [facility], as well as of the fact that they are under no obligation to receive care at these facilities.  PASRR submitted to EDS on 02/25/17     PASRR number received on 02/25/17     Existing PASRR number confirmed on       FL2 transmitted to all facilities in geographic area requested by pt/family on 02/25/17     FL2 transmitted to all facilities within larger geographic area on       Patient informed that his/her managed care company has contracts with or will negotiate with certain facilities, including the following:            Patient/family informed of bed offers received.  Patient chooses bed at  [facility])     Physician recommends and patient chooses bed at      Patient to be transferred to  [facility]) on 02/26/17.  Patient to be transferred to facility by  [facility])     Patient family notified on 02/26/17 of transfer.  Name of family member notified:  Son     PHYSICIAN Please sign FL2, Please prepare prescriptions     Additional Comment:     _______________________________________________ Serafina Mitchell, LCSWA 02/26/2017, 11:39 AM

## 2017-02-26 NOTE — Progress Notes (Signed)
Patient in a stable condition, discharge education completed, patient verbalised understanding, iv removed, tele dc, patient belongings at bedside, patient awaiting EMS for transportation to [facility],

## 2017-02-26 NOTE — Clinical Social Work Note (Signed)
Clinical Social Worker facilitated patient discharge including contacting patient family (Son) and facility Passenger transport manager) to confirm patient discharge plans. Clinical information faxed to facility and family agreeable with plan. CSW arranged ambulance transport via [facility] to [facility] RN to call report [phone] prior to discharge.  Clinical Social Worker will sign off for now as social work intervention is no longer needed. Please consult [place] again if new need arises.  Noble Cicalese B. Joline Maxcy Clinical Social Work Dept Weekend Social Worker [phone] 11:35 AM

## 2017-02-26 NOTE — Progress Notes (Signed)
This RN attempetd to call report twice to [facility] but got no response

## 2017-02-27 ENCOUNTER — Encounter: Payer: Self-pay | Admitting: Internal Medicine

## 2017-02-27 ENCOUNTER — Non-Acute Institutional Stay (SKILLED_NURSING_FACILITY): Payer: BLUE CROSS/BLUE SHIELD | Admitting: Internal Medicine

## 2017-02-27 DIAGNOSIS — R5381 Other malaise: Secondary | ICD-10-CM | POA: Diagnosis not present

## 2017-02-27 DIAGNOSIS — R0789 Other chest pain: Secondary | ICD-10-CM | POA: Diagnosis not present

## 2017-02-27 DIAGNOSIS — E871 Hypo-osmolality and hyponatremia: Secondary | ICD-10-CM

## 2017-02-27 DIAGNOSIS — R600 Localized edema: Secondary | ICD-10-CM | POA: Diagnosis not present

## 2017-02-27 DIAGNOSIS — J439 Emphysema, unspecified: Secondary | ICD-10-CM | POA: Diagnosis not present

## 2017-02-27 DIAGNOSIS — I959 Hypotension, unspecified: Secondary | ICD-10-CM | POA: Diagnosis not present

## 2017-02-27 DIAGNOSIS — N400 Enlarged prostate without lower urinary tract symptoms: Secondary | ICD-10-CM | POA: Diagnosis not present

## 2017-02-27 DIAGNOSIS — D62 Acute posthemorrhagic anemia: Secondary | ICD-10-CM | POA: Diagnosis not present

## 2017-02-27 DIAGNOSIS — D72829 Elevated white blood cell count, unspecified: Secondary | ICD-10-CM

## 2017-02-27 DIAGNOSIS — E876 Hypokalemia: Secondary | ICD-10-CM | POA: Diagnosis not present

## 2017-02-27 DIAGNOSIS — I2510 Atherosclerotic heart disease of native coronary artery without angina pectoris: Secondary | ICD-10-CM | POA: Diagnosis not present

## 2017-02-27 DIAGNOSIS — G8918 Other acute postprocedural pain: Secondary | ICD-10-CM

## 2017-02-27 NOTE — Progress Notes (Signed)
LOCATION: [facility]  PCP: [name], MD   Code Status: Full Code  Goals of care: Advanced Directive information Advanced Directives 02/25/2017  Does Patient Have a Medical Advance Directive? -  Would patient like information on creating a medical advance directive? No - Patient declined       Extended Emergency Contact Information Primary Emergency Contact: [place] of [place] Phone: [phone] Relation: Son   Allergies  Allergen Reactions  . No Known Allergies     Chief Complaint  Patient presents with  . New Admit To SNF    New Admission Visit     HPI:  Patient is a 71 y.o. male seen today for short term rehabilitation post hospital admission from 02/14/17-02/24/17 and 02/24/17-02/26/17. He was in the hospital initially with CAD and underwent CABG X 2. During surgery he was diagnosed to have emphysematous changes to his lungs and had stapling of left upper lobe pulmonary bleb. He had ABLA but did not required any blood transfusion. He was started on iron and folic acid supplement. Family opted to take him home despite of recommendation for SNF. Post discharge, the same day he had generalized weakness with dyspnea and was admitted with deconditioning. Infectious workup was negative. He had low BP and his metoprolol dosing was decreased. He responded well to bronchodilators. He is seen in his room today.   Review of Systems:  Constitutional: Negative for fever, chills, diaphoresis. he feels weak and tired.  HENT: Negative for headache, congestion, nasal discharge, sore throat, difficulty swallowing.   Eyes: Negative for eye pain, blurred vision, double vision and discharge.  Respiratory: Negative for wheezing.  Positive for cough mostly dry and has shortness of breath with minimal exertion. Cardiovascular: Negative for palpitations, leg swelling. Positive for chest wall soreness with movement and cough.  Gastrointestinal: Negative for  heartburn, nausea, vomiting, abdominal pain, loss of appetite. Had a bowel movement this am. Has black colored stool and is on iron supplement Genitourinary: Negative for dysuria and flank pain.  Musculoskeletal: Negative for back pain, fall.  Skin: Negative for itching, rash.  Neurological: Negative for dizziness. Psychiatric/Behavioral: Negative for depression.   Past Medical History:  Diagnosis Date  . Patient denies medical problems    Past Surgical History:  Procedure Laterality Date  . ABDOMINAL HYSTERECTOMY    . CATARACT EXTRACTION, BILATERAL    . CORONARY ARTERY BYPASS GRAFT N/A 02/14/2017   Procedure: CORONARY ARTERY BYPASS GRAFTING (CABG) x 2 , using left internal mammary artery and right leg greater saphenous vein harvested endoscopically LIMA-LAD, SVG-RAMUS;  Surgeon: [name], MD;  Location: [facility];  Service: Open Heart Surgery;  Laterality: N/A;  . ENDOVEIN HARVEST OF GREATER SAPHENOUS VEIN Right 02/14/2017   Procedure: ENDOVEIN HARVEST OF RIGHT THIGH GREATER SAPHENOUS VEIN;  Surgeon: [name], MD;  Location: [facility];  Service: Open Heart Surgery;  Laterality: Right;  . LEFT HEART CATH AND CORONARY ANGIOGRAPHY N/A 02/14/2017   Procedure: Left Heart Cath and Coronary Angiography;  Surgeon: [name], MD;  Location: [facility];  Service: Cardiovascular;  Laterality: N/A;  . NASAL POLYP SURGERY    . STAPLING OF BLEBS N/A 02/14/2017   Procedure: STAPLING OF LARGE LEFT UPPER LOBE PULMONARY BLEB;  Surgeon: [name], MD;  Location: [facility];  Service: Open Heart Surgery;  Laterality: N/A;  . TEE WITHOUT CARDIOVERSION N/A 02/14/2017   Procedure: TRANSESOPHAGEAL ECHOCARDIOGRAM (TEE);  Surgeon: [name], MD;  Location: [facility];  Service: Open Heart Surgery;  Laterality: N/A;   Social History:   reports that he has quit smoking. He has never used smokeless tobacco. He reports that he does not drink alcohol or use drugs.  Family History    Problem Relation Age of Onset  . Obesity Son     Medications: Allergies as of 02/27/2017      Reactions   No Known Allergies       Medication List       Accurate as of 02/27/17 12:15 PM. Always use your most recent med list.          acetaminophen 500 MG tablet Commonly known as:  TYLENOL Take 500 mg by mouth every 6 (six) hours as needed.   ALKA-SELTZER PO Take 325 mg by mouth every 6 (six) hours as needed.   aspirin 325 MG EC tablet Take 1 tablet (325 mg total) by mouth daily.   atorvastatin 80 MG tablet Commonly known as:  LIPITOR Take 1 tablet (80 mg total) by mouth daily at 6 PM.   bisacodyl 5 MG EC tablet Commonly known as:  DULCOLAX Take 2 tablets (10 mg total) by mouth daily as needed for moderate constipation (Give daily if no BM).   furosemide 20 MG tablet Commonly known as:  LASIX Take 1 tablet (20 mg total) by mouth daily.   Ipratropium-Albuterol 20-100 MCG/ACT Aers respimat Commonly known as:  COMBIVENT Inhale 1 puff into the lungs every 6 (six) hours as needed for wheezing.   metoprolol tartrate 25 MG tablet Commonly known as:  LOPRESSOR Take 0.5 tablets (12.5 mg total) by mouth 2 (two) times daily.   ondansetron 4 MG tablet Commonly known as:  ZOFRAN Take 1 tablet (4 mg total) by mouth every 6 (six) hours as needed for nausea.   oxyCODONE 5 MG immediate release tablet Commonly known as:  Oxy IR/ROXICODONE Take 1 tablet (5 mg total) by mouth every 4 (four) hours as needed for severe pain.   potassium chloride 10 MEQ tablet Commonly known as:  K-DUR,KLOR-CON Take 1 tablet (10 mEq total) by mouth daily.   tamsulosin 0.4 MG Caps capsule Commonly known as:  FLOMAX Take 1 capsule (0.4 mg total) by mouth daily.       Immunizations: There is no immunization history for the selected administration types on file for this patient.   Physical Exam: Vitals:   02/27/17 1141  BP: (!) 110/57  Pulse: 93  Resp: 20  Temp: 98.7 F (37.1 C)   TempSrc: Oral  SpO2: 94%  Weight: 150 lb 6.4 oz (68.2 kg)  Height: 5\' 5"  (1.651 m)   Body mass index is 25.03 kg/m.  General- elderly male, well built, in no acute distress Head- normocephalic, atraumatic Nose- no nasal discharge Throat- moist mucus membrane, normal oropharynx Eyes- PERRLA, EOMI, no pallor, no icterus, no discharge, normal conjunctiva, normal sclera Neck- no cervical lymphadenopathy Cardiovascular- normal s1,s2, no murmur Respiratory- bilateral decreased air entry, no wheeze, no rhonchi, no crackles, no use of accessory muscles, on 2 liter oxygen by nasal canula Abdomen- bowel sounds present, soft, non tender, no guarding or rigidity Musculoskeletal- able to move all 4 extremities, generalized weakness, trace leg edema Neurological- alert and oriented to person, place and time Skin- warm and dry, surgical incision to sternum healing well. Graft site to right lower extremity healing well, easy bruising Psychiatry- normal mood and affect    Labs reviewed: Basic Metabolic Panel:  Recent Labs  02/15/17  0327  02/15/17 1616  02/23/17 0213 02/24/17 1959 02/25/17 0246  NA 135  --   --   < > 133* 128* 133*  K 4.0  --   --   < > 3.6 3.6 3.9  CL 104  --   --   < > 96* 88* 94*  CO2 25  --   --   < > 28 27 28   GLUCOSE 148*  --   --   < > 103* 115* 101*  BUN 12  --   --   < > 23* 23* 21*  CREATININE 0.84  < > 1.01  < > 1.06 1.19 1.11  CALCIUM 7.8*  --   --   < > 8.2* 8.2* 8.2*  MG 2.5*  --  2.3  --   --   --   --   < > = values in this interval not displayed. Liver Function Tests: No results for input(s): AST, ALT, ALKPHOS, BILITOT, PROT, ALBUMIN in the last 8760 hours. No results for input(s): LIPASE, AMYLASE in the last 8760 hours. No results for input(s): AMMONIA in the last 8760 hours. CBC:  Recent Labs  02/21/17 0227 02/24/17 1959 02/25/17 0246  WBC 8.2 13.5* 11.5*  HGB 8.3* 9.6* 8.9*  HCT 25.5* 29.7* 27.4*  MCV 92.4 92.8 93.2  PLT 245 350 347    Cardiac Enzymes:  Recent Labs  02/13/17 2212 02/14/17 0138 02/14/17 0843  TROPONINI >65.00* >65.00* 50.17*   BNP: Invalid input(s): POCBNP CBG:  Recent Labs  02/20/17 2013 02/21/17 0656 02/24/17 1943  GLUCAP 110* 108* 115*    Radiological Exams: Dg Chest 2 View  Result Date: 02/21/2017 CLINICAL DATA:  Bypass surgery. EXAM: CHEST  2 VIEW COMPARISON:  02/20/2017.  02/19/2017.  02/18/2017.  02/13/2017 . FINDINGS: Prior CABG. Heart size normal. Interim improvement of bilateral pulmonary infiltrates. Interstitial prominence noted bilaterally most likely related to chronic interstitial lung disease. COPD . Scratched Basal pleural-parenchymal thickening noted most likely scarring. Small bilateral pleural effusions. No pneumothorax. IMPRESSION: 1.  Interim improvement of bilateral pulmonary infiltrates . 2. COPD. Interstitial prominence noted most likely related to chronic interstitial lung disease. Bilateral pleural-parenchymal scarring. Small bilateral pleural effusions. Electronically Signed   By: Marcello Moores  Register   On: 02/21/2017 08:00   Dg Chest Port 1 View  Result Date: 02/24/2017 CLINICAL DATA:  Near syncope. EXAM: PORTABLE CHEST 1 VIEW COMPARISON:  Feb 21, 2017 FINDINGS: No pneumothorax. A small left effusion is identified. Bibasilar opacities are seen, stable on the left and increased in the medial right base in the interval. The cardiomediastinal silhouette is stable. IMPRESSION: The opacity in the medial right lung base has increased in the interval. There is probably a tiny right effusion. A tiny left effusion and underlying opacity on the left is stable. Recommend follow-up to resolution. Electronically Signed   By: Dorise Bullion III M.D   On: 02/24/2017 20:46   Dg Chest Port 1 View  Result Date: 02/20/2017 CLINICAL DATA:  Shortness of breath and wheezing.  History of CABG. EXAM: PORTABLE CHEST 1 VIEW COMPARISON:  Chest radiograph February 19, 2017 FINDINGS: Cardiomediastinal  silhouette is normal, status post median sternotomy for CABG. Severe bullous changes with chronic interstitial changes increased lung volumes. Small pleural effusions and bibasilar airspace opacities. No pneumothorax. Osteopenia. Soft tissue planes are nonsuspicious. IMPRESSION: COPD. Small pleural effusions and bibasilar atelectasis, or pneumonia. Followup PA and lateral chest X-ray is recommended in 3-4 weeks following trial of antibiotic therapy to  ensure resolution and exclude underlying malignancy. Electronically Signed   By: Elon Alas M.D.   On: 02/20/2017 03:22   Dg Chest Port 1 View  Result Date: 02/19/2017 CLINICAL DATA:  Status post coronary artery bypass grafting. Recent pneumothorax. EXAM: PORTABLE CHEST 1 VIEW COMPARISON:  February 18, 2017 FINDINGS: The previously noted basilar pneumothorax on the left is no longer evident. There is patchy atelectasis in the left base. There is mild scarring in the right base. There are rather minimal pleural effusions bilaterally. There is bullous disease in the upper lobes. There is no new opacity. Heart size is normal. Pulmonary vascularity reflects the underlying bullous disease. There is aortic atherosclerosis. Patient is status post coronary artery bypass grafting. IMPRESSION: Previously noted left base pneumothorax no longer evident. Patchy atelectatic change in the left base remains. There is a degree of underlying COPD. There is scarring in the right base. No new opacity. Stable cardiac silhouette. There is aortic atherosclerosis. Electronically Signed   By: Lowella Grip III M.D.   On: 02/19/2017 07:24   Dg Chest Port 1 View  Result Date: 02/18/2017 CLINICAL DATA:  Worsening dyspnea. EXAM: PORTABLE CHEST 1 VIEW COMPARISON:  Chest radiograph February 16, 2017 FINDINGS: Interval removal of LEFT chest tube with small LEFT lung base pneumothorax. Cardiomediastinal silhouette is normal, status post median sternotomy. Mildly calcified aortic knob.  Similar patchy bibasilar airspace opacities. Increased lung volumes. Blunting of the RIGHT costophrenic angle. Soft tissue planes and included osseous structures are nonsuspicious. IMPRESSION: Interval removal of LEFT chest tube with small LEFT pneumothorax. Similar bibasilar airspace opacities and small RIGHT pleural effusion. COPD. These results will be called to the ordering clinician or representative by the Radiologist Assistant, and communication documented in the [facility] Dashboard. Electronically Signed   By: Elon Alas M.D.   On: 02/18/2017 05:05   Dg Chest Port 1 View  Result Date: 02/16/2017 CLINICAL DATA:  Chest tube after CABG EXAM: PORTABLE CHEST 1 VIEW COMPARISON:  Yesterday FINDINGS: Swan-Ganz catheter and midline thoracic drain have been removed. There is still left-sided chest tube. No visible pneumothorax. Status post CABG. Stable normal heart size. Advanced emphysema with layering pleural effusions and atelectasis. Interstitial coarsening in the lower lungs. IMPRESSION: 1. Left chest tube without visible pneumothorax. 2. Layering pleural effusions, more apparent today. 3. Lower zone interstitial coarsening, suspect atypical edema due to advanced emphysema. Electronically Signed   By: Monte Fantasia M.D.   On: 02/16/2017 07:49   Dg Chest Port 1 View  Result Date: 02/15/2017 CLINICAL DATA:  Status post coronary artery bypass grafting. Chest tube in place EXAM: PORTABLE CHEST 1 VIEW COMPARISON:  February 14, 2017 FINDINGS: Endotracheal tube and nasogastric tube have been removed. Swan-Ganz catheter tip is in the main pulmonary outflow tract. There is a left chest tube and mediastinal drain, unchanged. Temporary pacemaker wires remain attached to the right heart. No pneumothorax. There is upper lobe bullous disease. There is patchy opacity in the lower lung zones bilaterally, new from prior study. Suspect pulmonary edema as most likely etiology. There is stable scarring in the bases.  Heart is upper normal in size. The pulmonary vascularity is stable and overall within normal limits given underlying bullous disease. No adenopathy. There is aortic atherosclerosis. IMPRESSION: Tube and catheter positions as described without pneumothorax. New opacity in the lung bases. Suspect pulmonary edema, although pneumonia could present in this manner. Both pneumonia and pulmonary edema may be present concurrently. The cardiac silhouette is unchanged. Underlying COPD. There is aortic  atherosclerosis. Electronically Signed   By: Lowella Grip III M.D.   On: 02/15/2017 07:47   Dg Chest Port 1 View  Result Date: 02/14/2017 CLINICAL DATA:  Postoperative radiograph, status post CABG. Initial encounter. EXAM: PORTABLE CHEST 1 VIEW COMPARISON:  Chest radiograph performed 02/13/2017 FINDINGS: The patient's endotracheal tube is seen ending 4 cm above the carina. An enteric tube is noted ending overlying the fundus of the stomach. The right IJ Swan-Ganz catheter is noted ending about the pulmonary outflow tract. A left-sided chest tube is noted. No definite pneumothorax is seen. Mild vascular congestion is noted. Underlying bullous change is noted bilaterally. No pleural effusion is identified. The cardiomediastinal silhouette is borderline normal in size. The patient is status post median sternotomy, with evidence of CABG. No acute osseous abnormalities are identified. IMPRESSION: 1. Endotracheal tube seen ending 4 cm above the carina. Remaining tubes and lines as described above. 2. Mild vascular congestion noted.  Lungs remain grossly clear. 3. Underlying bilateral emphysematous bulla noted. Electronically Signed   By: Garald Balding M.D.   On: 02/14/2017 18:44   Dg Chest Port 1 View  Result Date: 02/13/2017 CLINICAL DATA:  89-year-old male could chest pain. EXAM: PORTABLE CHEST 1 VIEW COMPARISON:  None. FINDINGS: There is emphysematous changes of the lungs with areas of bullous appearing in the upper  lobes. There is hyperexpansion of the lungs with bilateral flattening of the diaphragms. Bilateral mid to lower lung field linear densities most consistent with atelectasis/scarring. There is no focal consolidation, pleural effusion, or pneumothorax. The cardiac silhouette is within normal limits. There is atherosclerotic calcification of the aortic arch. No acute osseous pathology. IMPRESSION: 1. No acute cardiopulmonary process. 2. Emphysema. Electronically Signed   By: Anner Crete M.D.   On: 02/13/2017 23:01    Assessment/Plan  Physical deconditioning Will have him work with physical therapy and occupational therapy team to help with gait training and muscle strengthening exercises.fall precautions. Skin care. Encourage to be out of bed.   CAD s/p CABG x 2. Change lopressor to 12.5 mg bid from 25 mg bid as below. Continue aspirin ec 325 mg daily and atorvastatin 80 mg daily. Make follow up with cardiology and cardiothoracic surgery. Continue oxygen by nasal canula for now.   Shortness of breath With deconditioning, CAD and emphysema. Will have patient work with PT/OT as tolerated to regain strength and restore function.  Fall precautions are in place. Start duoneb as below. Encourage to use incentive spirometer. Start cough expectorant. Get chest xray in 1 week to f/u on effusion and clearing of opacities  Emphysema With COPD, s/p stapling of bleb to LUL. Currently on combivent inhaler. Given his weakness and dyspnea, change this to duoneb q6h scheduled for 1 week, then q6h prn and monitor. Encouraged to use incentive spirometer. Add robitussin 10 cc bid for cough x 2 weeks  Hypotension Low but stable BP on review. Per d/c summary, pt to be on lopressor 12.5 mg bid but is receiving 25 mg bid. Make changes to dosing. Monitor BP/HR  Leg edema Stable, currently on lasix 40 mg daily. Patient to be on 20 mg daily per d/c summary. Change this to 20 mg daily. Decrease kcl to 10 meq daily.    Chest wall soreness Post surgery, continue tylenol 500 mg q6h prn pain and oxycodone 5 mg q4h prn pain.   Hypokalemia Change kcl per d/c summary. Check bmp  Acute blood loss anemia Post op, start ferrous sulfate 325 mg daily. Check cbc  Hyponatremia Monitor BMP  Leukocytosis Afebrile, likely reactive, monitor cbc with diff, temp curve  BPH Continue flomax, monitor urine output  Discussed with alka-seltzer   Goals of care: short term rehabilitation   Labs/tests ordered: cbc, cmp, magnesium 03/01/17  Family/ staff Communication: reviewed care plan with patient and nursing supervisor  I have spent greater than 50 minutes for this encounter which includes reviewing hospital records, addressing above mentioned concerns, reviewing care plan with patient, answering patient's concerns and counseling her.     Blanchie Serve, MD Internal Medicine [facility] [address] [place], [place] [address] Cell Phone (Monday-Friday 8 am - 5 pm): [phone] On Call: [phone] and follow prompts after 5 pm and on weekends Office Phone: [phone] Office Fax: [phone]

## 2017-02-28 ENCOUNTER — Non-Acute Institutional Stay: Payer: BLUE CROSS/BLUE SHIELD | Admitting: Family

## 2017-02-28 ENCOUNTER — Encounter: Payer: Self-pay | Admitting: Family

## 2017-02-28 DIAGNOSIS — I950 Idiopathic hypotension: Secondary | ICD-10-CM

## 2017-02-28 DIAGNOSIS — R0600 Dyspnea, unspecified: Secondary | ICD-10-CM | POA: Diagnosis not present

## 2017-02-28 NOTE — Progress Notes (Signed)
Location:  [facility] Room Number: 908-P Place of Service:  SNF [phone]) Provider: Luke Rigsbee FNP-C  Ellison Hughs Chrissie Noa, MD  Patient Care Team: [name], MD as PCP - General (Family Medicine)  Extended Emergency Contact Information Primary Emergency Contact: [place] Phone: [phone] Relation: Son  Code Status:  Full Code  Goals of care: Advanced Directive information Advanced Directives 02/28/2017  Does Patient Have a Medical Advance Directive? No  Would patient like information on creating a medical advance directive? -     Chief Complaint  Patient presents with  . Acute Visit    Low BP since surgery per patient    HPI:  Pt is a 71 y.o. male seen today at [facility] for an acute visit for evaluation of low blood pressure. He is status post CABG x 2. He is seen in his room today.He complains of shortness of breath though states gets relief with breathing treatment.He states was unable to participate with therapy 02/27/2017.He states feels much better today. He denies any fever,chills, cough, or edema. Facility therapy reports patient's oxygen saturation was 92 % on oxygen via [place].Facility Nurse reports patient's B/p was 90/62. Facility Blood pressure log running in the 110's/60's. He is currently on Metoprolol and furosemide with Holding parameters.      Past Medical History:  Diagnosis Date  . Patient denies medical problems    Past Surgical History:  Procedure Laterality Date  . ABDOMINAL HYSTERECTOMY    . CATARACT EXTRACTION, BILATERAL    . CORONARY ARTERY BYPASS GRAFT N/A 02/14/2017   Procedure: CORONARY ARTERY BYPASS GRAFTING (CABG) x 2 , using left internal mammary artery and right leg greater saphenous vein harvested endoscopically LIMA-LAD, SVG-RAMUS;  Surgeon: [name], MD;  Location: [place];  Service: Open Heart Surgery;  Laterality: N/A;  . ENDOVEIN  HARVEST OF GREATER SAPHENOUS VEIN Right 02/14/2017   Procedure: ENDOVEIN HARVEST OF RIGHT THIGH GREATER SAPHENOUS VEIN;  Surgeon: [name], MD;  Location: [place];  Service: Open Heart Surgery;  Laterality: Right;  . LEFT HEART CATH AND CORONARY ANGIOGRAPHY N/A 02/14/2017   Procedure: Left Heart Cath and Coronary Angiography;  Surgeon: [name], MD;  Location: [place] CV [facility];  Service: Cardiovascular;  Laterality: N/A;  . NASAL POLYP SURGERY    . STAPLING OF BLEBS N/A 02/14/2017   Procedure: STAPLING OF LARGE LEFT UPPER LOBE PULMONARY BLEB;  Surgeon: [name], MD;  Location: [place];  Service: Open Heart Surgery;  Laterality: N/A;  . TEE WITHOUT CARDIOVERSION N/A 02/14/2017   Procedure: TRANSESOPHAGEAL ECHOCARDIOGRAM (TEE);  Surgeon: [name], MD;  Location: [place];  Service: Open Heart Surgery;  Laterality: N/A;    Allergies  Allergen Reactions  . No Known Allergies     Allergies as of 02/28/2017      Reactions   No Known Allergies       Medication List       Accurate as of 02/28/17 10:37 PM. Always use your most recent med list.          acetaminophen 500 MG tablet Commonly known as:  TYLENOL Take 500 mg by mouth every 6 (six) hours as needed.   aspirin 325 MG EC tablet Take 1 tablet (325 mg total) by mouth daily.   atorvastatin 80 MG tablet Commonly known as:  LIPITOR Take 1 tablet (80 mg total) by mouth daily at 6 PM.  bisacodyl 5 MG EC tablet Commonly known as:  DULCOLAX Take 2 tablets (10 mg total) by mouth daily as needed for moderate constipation (Give daily if no BM).   ferrous sulfate 325 (65 FE) MG tablet Take 325 mg by mouth daily with breakfast.   furosemide 20 MG tablet Commonly known as:  LASIX Take 20 mg by mouth daily.   guaiFENesin 100 MG/5ML Soln Commonly known as:  ROBITUSSIN Take 10 mLs by mouth 2 (two) times daily.   ipratropium-albuterol 0.5-2.5 (3) MG/3ML Soln Commonly known as:  DUONEB Take 3 mLs by  nebulization 4 (four) times daily. When awake   metoprolol tartrate 25 MG tablet Commonly known as:  LOPRESSOR Take 12.5 mg by mouth 2 (two) times daily. Hold for SBP< 110 or HR <60   ondansetron 4 MG tablet Commonly known as:  ZOFRAN Take 1 tablet (4 mg total) by mouth every 6 (six) hours as needed for nausea.   oxyCODONE 5 MG immediate release tablet Commonly known as:  Oxy IR/ROXICODONE Take 1 tablet (5 mg total) by mouth every 4 (four) hours as needed for severe pain.   OXYGEN Inhale into the lungs. By nasal cannula and wean as tolerated to keep O2 sats >90%   potassium chloride 10 MEQ tablet Commonly known as:  K-DUR Take 10 mEq by mouth daily.   tamsulosin 0.4 MG Caps capsule Commonly known as:  FLOMAX Take 1 capsule (0.4 mg total) by mouth daily.       Review of Systems  Constitutional: Negative for activity change, chills, diaphoresis, fatigue and fever.  HENT: Negative for congestion, rhinorrhea, sinus pain, sinus pressure, sneezing and sore throat.   Eyes: Negative.   Respiratory: Positive for shortness of breath. Negative for cough, chest tightness and wheezing.   Cardiovascular: Negative for chest pain, palpitations and leg swelling.  Gastrointestinal: Negative for abdominal distention, abdominal pain, constipation, diarrhea, nausea and vomiting.  Genitourinary: Negative for dysuria, flank pain, frequency and urgency.  Musculoskeletal: Positive for gait problem.  Skin: Negative for color change, pallor and rash.       Mid chest and right calf muscle surgical incision   Neurological: Negative for dizziness, seizures, light-headedness and headaches.  Psychiatric/Behavioral: Negative for agitation, confusion, hallucinations and sleep disturbance. The patient is not nervous/anxious.     Immunization History  Administered Date(s) Administered  . PPD Test 02/27/2017   Pertinent  Health Maintenance Due  Topic Date Due  . COLONOSCOPY  08/08/1996  . PNA vac Low  Risk Adult (1 of 2 - PCV13) 08/09/2011  . INFLUENZA VACCINE  05/24/2017    Vitals:   02/28/17 1204  BP: 90/62  Pulse: 93  Resp: 20  Temp: 98.7 F (37.1 C)  SpO2: 94%  Weight: 150 lb (68 kg)  Height: 5\' 5"  (1.651 m)   Body mass index is 24.96 kg/m. Physical Exam  Constitutional: He is oriented to person, place, and time. He appears well-developed and well-nourished. No distress.  HENT:  Head: Normocephalic.  Mouth/Throat: Oropharynx is clear and moist. No oropharyngeal exudate.  Eyes: Conjunctivae and EOM are normal. Pupils are equal, round, and reactive to light. Right eye exhibits no discharge. Left eye exhibits no discharge. No scleral icterus.  Neck: Normal range of motion. No JVD present. No thyromegaly present.  Cardiovascular: Normal rate, regular rhythm, normal heart sounds and intact distal pulses.  Exam reveals no gallop and no friction rub.   No murmur heard. Pulmonary/Chest: Effort normal. No respiratory distress. He has no wheezes.  He has no rales.  Diminished breath sounds to bilateral bases.  Abdominal: Soft. Bowel sounds are normal. He exhibits no distension. There is no tenderness. There is no rebound and no guarding.  Genitourinary:  Genitourinary Comments: Uses urinal   Musculoskeletal: He exhibits no edema, tenderness or deformity.  Moves x 4 extremities. Unsteady gait   Lymphadenopathy:    He has no cervical adenopathy.  Neurological: He is oriented to person, place, and time.  Skin: Skin is warm and dry. No rash noted. No erythema. No pallor.  Mid chest and right calf surgical incision dry,clean and intact  Psychiatric: He has a normal mood and affect.    Labs reviewed:  Recent Labs  02/15/17 0327  02/15/17 1616  02/23/17 0213 02/24/17 1959 02/25/17 0246  NA 135  --   --   < > 133* 128* 133*  K 4.0  --   --   < > 3.6 3.6 3.9  CL 104  --   --   < > 96* 88* 94*  CO2 25  --   --   < > 28 27 28   GLUCOSE 148*  --   --   < > 103* 115* 101*  BUN 12   --   --   < > 23* 23* 21*  CREATININE 0.84  < > 1.01  < > 1.06 1.19 1.11  CALCIUM 7.8*  --   --   < > 8.2* 8.2* 8.2*  MG 2.5*  --  2.3  --   --   --   --   < > = values in this interval not displayed.  Recent Labs  02/21/17 0227 02/24/17 1959 02/25/17 0246  WBC 8.2 13.5* 11.5*  HGB 8.3* 9.6* 8.9*  HCT 25.5* 29.7* 27.4*  MCV 92.4 92.8 93.2  PLT 245 350 347   Lab Results  Component Value Date   TSH 1.836 02/14/2017   Lab Results  Component Value Date   HGBA1C 5.5 02/14/2017   Lab Results  Component Value Date   CHOL 114 02/14/2017   HDL 39 (L) 02/14/2017   LDLCALC 65 02/14/2017   TRIG 51 02/14/2017   CHOLHDL 2.9 02/14/2017   Significant Diagnostic Results in last 30 days:  Dg Chest 2 View  Result Date: 02/21/2017 CLINICAL DATA:  Bypass surgery. EXAM: CHEST  2 VIEW COMPARISON:  02/20/2017.  02/19/2017.  02/18/2017.  02/13/2017 . FINDINGS: Prior CABG. Heart size normal. Interim improvement of bilateral pulmonary infiltrates. Interstitial prominence noted bilaterally most likely related to chronic interstitial lung disease. COPD . Scratched Basal pleural-parenchymal thickening noted most likely scarring. Small bilateral pleural effusions. No pneumothorax. IMPRESSION: 1.  Interim improvement of bilateral pulmonary infiltrates . 2. COPD. Interstitial prominence noted most likely related to chronic interstitial lung disease. Bilateral pleural-parenchymal scarring. Small bilateral pleural effusions. Electronically Signed   By: Marcello Moores  Register   On: 02/21/2017 08:00   Dg Chest Port 1 View  Result Date: 02/24/2017 CLINICAL DATA:  Near syncope. EXAM: PORTABLE CHEST 1 VIEW COMPARISON:  Feb 21, 2017 FINDINGS: No pneumothorax. A small left effusion is identified. Bibasilar opacities are seen, stable on the left and increased in the medial right base in the interval. The cardiomediastinal silhouette is stable. IMPRESSION: The opacity in the medial right lung base has increased in the interval.  There is probably a tiny right effusion. A tiny left effusion and underlying opacity on the left is stable. Recommend follow-up to resolution. Electronically Signed   By: Shanon Brow  Mee Hives M.D   On: 02/24/2017 20:46   Dg Chest Port 1 View  Result Date: 02/20/2017 CLINICAL DATA:  Shortness of breath and wheezing.  History of CABG. EXAM: PORTABLE CHEST 1 VIEW COMPARISON:  Chest radiograph February 19, 2017 FINDINGS: Cardiomediastinal silhouette is normal, status post median sternotomy for CABG. Severe bullous changes with chronic interstitial changes increased lung volumes. Small pleural effusions and bibasilar airspace opacities. No pneumothorax. Osteopenia. Soft tissue planes are nonsuspicious. IMPRESSION: COPD. Small pleural effusions and bibasilar atelectasis, or pneumonia. Followup PA and lateral chest X-ray is recommended in 3-4 weeks following trial of antibiotic therapy to ensure resolution and exclude underlying malignancy. Electronically Signed   By: Elon Alas M.D.   On: 02/20/2017 03:22   Dg Chest Port 1 View  Result Date: 02/19/2017 CLINICAL DATA:  Status post coronary artery bypass grafting. Recent pneumothorax. EXAM: PORTABLE CHEST 1 VIEW COMPARISON:  February 18, 2017 FINDINGS: The previously noted basilar pneumothorax on the left is no longer evident. There is patchy atelectasis in the left base. There is mild scarring in the right base. There are rather minimal pleural effusions bilaterally. There is bullous disease in the upper lobes. There is no new opacity. Heart size is normal. Pulmonary vascularity reflects the underlying bullous disease. There is aortic atherosclerosis. Patient is status post coronary artery bypass grafting. IMPRESSION: Previously noted left base pneumothorax no longer evident. Patchy atelectatic change in the left base remains. There is a degree of underlying COPD. There is scarring in the right base. No new opacity. Stable cardiac silhouette. There is aortic  atherosclerosis. Electronically Signed   By: Lowella Grip III M.D.   On: 02/19/2017 07:24   Dg Chest Port 1 View  Result Date: 02/18/2017 CLINICAL DATA:  Worsening dyspnea. EXAM: PORTABLE CHEST 1 VIEW COMPARISON:  Chest radiograph February 16, 2017 FINDINGS: Interval removal of LEFT chest tube with small LEFT lung base pneumothorax. Cardiomediastinal silhouette is normal, status post median sternotomy. Mildly calcified aortic knob. Similar patchy bibasilar airspace opacities. Increased lung volumes. Blunting of the RIGHT costophrenic angle. Soft tissue planes and included osseous structures are nonsuspicious. IMPRESSION: Interval removal of LEFT chest tube with small LEFT pneumothorax. Similar bibasilar airspace opacities and small RIGHT pleural effusion. COPD. These results will be called to the ordering clinician or representative by the Radiologist Assistant, and communication documented in the [facility] Dashboard. Electronically Signed   By: Elon Alas M.D.   On: 02/18/2017 05:05   Dg Chest Port 1 View  Result Date: 02/16/2017 CLINICAL DATA:  Chest tube after CABG EXAM: PORTABLE CHEST 1 VIEW COMPARISON:  Yesterday FINDINGS: Swan-Ganz catheter and midline thoracic drain have been removed. There is still left-sided chest tube. No visible pneumothorax. Status post CABG. Stable normal heart size. Advanced emphysema with layering pleural effusions and atelectasis. Interstitial coarsening in the lower lungs. IMPRESSION: 1. Left chest tube without visible pneumothorax. 2. Layering pleural effusions, more apparent today. 3. Lower zone interstitial coarsening, suspect atypical edema due to advanced emphysema. Electronically Signed   By: Monte Fantasia M.D.   On: 02/16/2017 07:49   Dg Chest Port 1 View  Result Date: 02/15/2017 CLINICAL DATA:  Status post coronary artery bypass grafting. Chest tube in place EXAM: PORTABLE CHEST 1 VIEW COMPARISON:  February 14, 2017 FINDINGS: Endotracheal tube and  nasogastric tube have been removed. Swan-Ganz catheter tip is in the main pulmonary outflow tract. There is a left chest tube and mediastinal drain, unchanged. Temporary pacemaker wires remain attached to the right heart.  No pneumothorax. There is upper lobe bullous disease. There is patchy opacity in the lower lung zones bilaterally, new from prior study. Suspect pulmonary edema as most likely etiology. There is stable scarring in the bases. Heart is upper normal in size. The pulmonary vascularity is stable and overall within normal limits given underlying bullous disease. No adenopathy. There is aortic atherosclerosis. IMPRESSION: Tube and catheter positions as described without pneumothorax. New opacity in the lung bases. Suspect pulmonary edema, although pneumonia could present in this manner. Both pneumonia and pulmonary edema may be present concurrently. The cardiac silhouette is unchanged. Underlying COPD. There is aortic atherosclerosis. Electronically Signed   By: Lowella Grip III M.D.   On: 02/15/2017 07:47   Dg Chest Port 1 View  Result Date: 02/14/2017 CLINICAL DATA:  Postoperative radiograph, status post CABG. Initial encounter. EXAM: PORTABLE CHEST 1 VIEW COMPARISON:  Chest radiograph performed 02/13/2017 FINDINGS: The patient's endotracheal tube is seen ending 4 cm above the carina. An enteric tube is noted ending overlying the fundus of the stomach. The right IJ Swan-Ganz catheter is noted ending about the pulmonary outflow tract. A left-sided chest tube is noted. No definite pneumothorax is seen. Mild vascular congestion is noted. Underlying bullous change is noted bilaterally. No pleural effusion is identified. The cardiomediastinal silhouette is borderline normal in size. The patient is status post median sternotomy, with evidence of CABG. No acute osseous abnormalities are identified. IMPRESSION: 1. Endotracheal tube seen ending 4 cm above the carina. Remaining tubes and lines as  described above. 2. Mild vascular congestion noted.  Lungs remain grossly clear. 3. Underlying bilateral emphysematous bulla noted. Electronically Signed   By: Garald Balding M.D.   On: 02/14/2017 18:44   Dg Chest Port 1 View  Result Date: 02/13/2017 CLINICAL DATA:  64-year-old male could chest pain. EXAM: PORTABLE CHEST 1 VIEW COMPARISON:  None. FINDINGS: There is emphysematous changes of the lungs with areas of bullous appearing in the upper lobes. There is hyperexpansion of the lungs with bilateral flattening of the diaphragms. Bilateral mid to lower lung field linear densities most consistent with atelectasis/scarring. There is no focal consolidation, pleural effusion, or pneumothorax. The cardiac silhouette is within normal limits. There is atherosclerotic calcification of the aortic arch. No acute osseous pathology. IMPRESSION: 1. No acute cardiopulmonary process. 2. Emphysema. Electronically Signed   By: Anner Crete M.D.   On: 02/13/2017 23:01   Assessment/Plan  1. Idiopathic hypotension B/p 90/62 prior to visit. Log reviewed B/p running in the 110's/60's. Will continue to monitor. Hold metoprolol and furosemide if SBP < 110. Monitor vital signs every shift x 5 days then resume previous orders.   2. Dyspnea Oxygen saturation were 92 % on previous Duoneb treatment effective. Afebrile.diminished breath sounds to bases. Will obtain portable CXR Pa/Lat rule out PNA. Continue Duoneb treatment.   Family/ staff Communication: Reviewed plan of care with patient and facility Nurse supervisor  Labs/tests ordered: portable CXR Pa/Lat rule out PNA. Sandrea Hughs, NP

## 2017-03-01 ENCOUNTER — Ambulatory Visit: Payer: Medicare Other | Admitting: Internal Medicine

## 2017-03-01 LAB — HEPATIC FUNCTION PANEL
ALK PHOS: 86 U/L (ref 25–125)
ALT: 36 U/L (ref 10–40)
AST: 23 U/L (ref 14–40)
Bilirubin, Total: 0.4 mg/dL

## 2017-03-01 LAB — CBC AND DIFFERENTIAL
HCT: 30 % — AB (ref 41–53)
HEMOGLOBIN: 9.5 g/dL — AB (ref 13.5–17.5)
PLATELETS: 407 10*3/uL — AB (ref 150–399)
WBC: 8.6 10*3/mL

## 2017-03-01 LAB — BASIC METABOLIC PANEL
BUN: 29 mg/dL — AB (ref 4–21)
Creatinine: 0.9 mg/dL (ref 0.6–1.3)
Glucose: 104 mg/dL
POTASSIUM: 4.3 mmol/L (ref 3.4–5.3)
Sodium: 142 mmol/L (ref 137–147)

## 2017-03-02 ENCOUNTER — Telehealth (HOSPITAL_COMMUNITY): Payer: Self-pay | Admitting: Physician Assistant

## 2017-03-02 NOTE — Telephone Encounter (Signed)
      [address]       [place] [address]             [phone]    Wilkins Elpers [phone]   S/P CABG x2 performed on 02/15/2017.  Discharged home on 02/26/2017  Medications: Current Outpatient Prescriptions on File Prior to Visit  Medication Sig Dispense Refill  . acetaminophen (TYLENOL) 500 MG tablet Take 500 mg by mouth every 6 (six) hours as needed.    [name] aspirin 325 MG EC tablet Take 1 tablet (325 mg total) by mouth daily. 30 tablet 0  . atorvastatin (LIPITOR) 80 MG tablet Take 1 tablet (80 mg total) by mouth daily at 6 PM. 30 tablet 1  . bisacodyl (DULCOLAX) 5 MG EC tablet Take 2 tablets (10 mg total) by mouth daily as needed for moderate constipation (Give daily if no BM). 30 tablet 0  . ferrous sulfate 325 (65 FE) MG tablet Take 325 mg by mouth daily with breakfast.    . furosemide (LASIX) 20 MG tablet Take 20 mg by mouth daily.    [name] guaiFENesin (ROBITUSSIN) 100 MG/5ML SOLN Take 10 mLs by mouth 2 (two) times daily.    [name] ipratropium-albuterol (DUONEB) 0.5-2.5 (3) MG/3ML SOLN Take 3 mLs by nebulization 4 (four) times daily. When awake    . metoprolol tartrate (LOPRESSOR) 25 MG tablet Take 12.5 mg by mouth 2 (two) times daily. Hold for SBP< 110 or HR <60    . ondansetron (ZOFRAN) 4 MG tablet Take 1 tablet (4 mg total) by mouth every 6 (six) hours as needed for nausea. 20 tablet 0  . oxyCODONE (OXY IR/ROXICODONE) 5 MG immediate release tablet Take 1 tablet (5 mg total) by mouth every 4 (four) hours as needed for severe pain. 30 tablet 0  . OXYGEN Inhale into the lungs. By nasal cannula and wean as tolerated to keep O2 sats >90%    . potassium chloride (K-DUR) 10 MEQ tablet Take 10 mEq by mouth daily.    . tamsulosin (FLOMAX) 0.4 MG CAPS capsule Take 1 capsule (0.4 mg total) by mouth daily. 30 capsule 1   No current facility-administered medications on file prior to visit.     Coumadin:  INR check Yes/No  Problems/Concerns: Extensive conversation with the son  regarding his fathers care. He shares that Mr. Peterkin is overall doing well. He is getting his breathing treatments regularly and they are helping. He is still on 2L [place] all the time and I shared that the nurses need to take his oxygen level and wean as tolerated. I don't think he will need oxygen forever, but he needs to continue to work his lungs while in therapy. He has been walking well. His pain is well controlled. He didn't have any issues with his medications that the son can vouch for.   Assessment:  Patient is doing well.  Education provided. Spoke with son. Contact office if concerns or problems develop  Follow up Appointment:   His appointment is on 03/27/2017 at 1:30pm. If he is still in rehab on this date, he is to call our office and reschedule.   Nicholes Rough, PA-C

## 2017-03-17 ENCOUNTER — Non-Acute Institutional Stay (SKILLED_NURSING_FACILITY): Payer: BLUE CROSS/BLUE SHIELD | Admitting: Family

## 2017-03-17 ENCOUNTER — Encounter: Payer: Self-pay | Admitting: Family

## 2017-03-17 DIAGNOSIS — J439 Emphysema, unspecified: Secondary | ICD-10-CM

## 2017-03-17 DIAGNOSIS — E782 Mixed hyperlipidemia: Secondary | ICD-10-CM

## 2017-03-17 DIAGNOSIS — D509 Iron deficiency anemia, unspecified: Secondary | ICD-10-CM | POA: Diagnosis not present

## 2017-03-17 DIAGNOSIS — R2681 Unsteadiness on feet: Secondary | ICD-10-CM

## 2017-03-17 DIAGNOSIS — E876 Hypokalemia: Secondary | ICD-10-CM | POA: Diagnosis not present

## 2017-03-17 NOTE — Progress Notes (Signed)
Location:  [name] Room Number: [phone] Place of Service:  SNF [id number])  Provider: [name] FNP-C   PCP: [name], MD Patient Care Team: [name], MD as PCP - General (Family Medicine)  Extended Emergency Contact Information Primary Emergency Contact: [place] Phone: [phone] Relation: Son  Code Status: Full code  Goals of care:  Advanced Directive information Advanced Directives 03/17/2017  Does Patient Have a Medical Advance Directive? No  Would patient like information on creating a medical advance directive? -     Allergies  Allergen Reactions  . No Known Allergies     Chief Complaint  Patient presents with  . Discharge Note    Dicharge from [facility]    HPI:  71 y.o. male seen today at [place] for discharge home. He was here for short term rehabilitation for post hospital admission from 02/14/2017-02/24/2017 and 02/24/2017-02/26/2017. He was in the hospital initially with CAD and underwent CABG X 2. During surgery he was diagnosed to have emphysematous changes to his lungs and had stapling of left upper lobe pulmonary bleb. He had ABLA but did not required any blood transfusion. He was started on iron and folic acid supplement.He has a medical history of BPH,Anemia, COPD, hyperlipidemia CAD, NSTEMI among other conditions.He is seen in his room today. He states mid chest incision pain under control with current pain regimen.During his stay here in rehab his oxygen saturation dropped to low 90's. Portable CXR was negative for acute abnormalities but showed COPD.     He has worked well with PT/OT now stable for discharge home.He will be discharged home outpatient therapy at [facility] to continue with ROM, Exercise, Gait stability and muscle strengthening. He will  require  DME stationary and portable gaseous continuous oxygen 2 Liters via Nasal  cannula to keep oxygen saturation above 90%.  Discharge process will be arranged by facility social worker prior to discharge. Prescription medication will be written x 1 month then patient to follow up with PCP in 1-2 weeks. Facility staff report no new concerns.    Past Medical History:  Diagnosis Date  . Patient denies medical problems     Past Surgical History:  Procedure Laterality Date  . ABDOMINAL HYSTERECTOMY    . CATARACT EXTRACTION, BILATERAL    . CORONARY ARTERY BYPASS GRAFT N/A 02/14/2017   Procedure: CORONARY ARTERY BYPASS GRAFTING (CABG) x 2 , using left internal mammary artery and right leg greater saphenous vein harvested endoscopically LIMA-LAD, SVG-RAMUS;  Surgeon: [name], MD;  Location: [place];  Service: Open Heart Surgery;  Laterality: N/A;  . ENDOVEIN HARVEST OF GREATER SAPHENOUS VEIN Right 02/14/2017   Procedure: ENDOVEIN HARVEST OF RIGHT THIGH GREATER SAPHENOUS VEIN;  Surgeon: [name], MD;  Location: [place];  Service: Open Heart Surgery;  Laterality: Right;  . LEFT HEART CATH AND CORONARY ANGIOGRAPHY N/A 02/14/2017   Procedure: Left Heart Cath and Coronary Angiography;  Surgeon: [name], MD;  Location: [place] CV LAB;  Service: Cardiovascular;  Laterality: N/A;  . NASAL POLYP SURGERY    . STAPLING OF BLEBS N/A 02/14/2017   Procedure: STAPLING OF LARGE LEFT UPPER LOBE PULMONARY BLEB;  Surgeon: [name], MD;  Location: [place];  Service: Open Heart Surgery;  Laterality: N/A;  . TEE WITHOUT CARDIOVERSION N/A 02/14/2017   Procedure: TRANSESOPHAGEAL ECHOCARDIOGRAM (TEE);  Surgeon: [name], MD;  Location: [facility]  OR;  Service: Open Heart Surgery;  Laterality: N/A;      reports that he has quit smoking. He has never used smokeless tobacco. He reports that he does not drink alcohol or use drugs. Social History   Social History  . Marital status: Divorced    Spouse name: N/A  . Number of children: N/A  . Years of education: N/A    Occupational History  . Not on file.   Social History Main Topics  . Smoking status: Former Research scientist (life sciences)  . Smokeless tobacco: Never Used  . Alcohol use No  . Drug use: No  . Sexual activity: Not on file   Other Topics Concern  . Not on file   Social History Narrative  . No narrative on file    Allergies  Allergen Reactions  . No Known Allergies     Pertinent  Health Maintenance Due  Topic Date Due  . COLONOSCOPY  08/08/1996  . PNA vac Low Risk Adult (1 of 2 - PCV13) 08/09/2011  . INFLUENZA VACCINE  05/24/2017    Medications: Allergies as of 03/17/2017      Reactions   No Known Allergies       Medication List       Accurate as of 03/17/17  1:10 PM. Always use your most recent med list.          acetaminophen 500 MG tablet Commonly known as:  TYLENOL Take 500 mg by mouth every 6 (six) hours as needed.   aspirin 325 MG EC tablet Take 1 tablet (325 mg total) by mouth daily.   atorvastatin 80 MG tablet Commonly known as:  LIPITOR Take 1 tablet (80 mg total) by mouth daily at 6 PM.   bisacodyl 5 MG EC tablet Commonly known as:  DULCOLAX Take 2 tablets (10 mg total) by mouth daily as needed for moderate constipation (Give daily if no BM).   ferrous sulfate 325 (65 FE) MG tablet Take 325 mg by mouth daily with breakfast.   furosemide 20 MG tablet Commonly known as:  LASIX Take 20 mg by mouth daily.   ipratropium-albuterol 0.5-2.5 (3) MG/3ML Soln Commonly known as:  DUONEB Take 3 mLs by nebulization 4 (four) times daily. When awake   metoprolol tartrate 25 MG tablet Commonly known as:  LOPRESSOR Take 12.5 mg by mouth 2 (two) times daily. Hold for SBP< 110 or HR <60   oxyCODONE 5 MG immediate release tablet Commonly known as:  Oxy IR/ROXICODONE Take 1 tablet (5 mg total) by mouth every 4 (four) hours as needed for severe pain.   OXYGEN Inhale into the lungs. By nasal cannula and wean as tolerated to keep O2 sats >90%   potassium chloride 10 MEQ  tablet Commonly known as:  K-DUR Take 10 mEq by mouth daily.   tamsulosin 0.4 MG Caps capsule Commonly known as:  FLOMAX Take 1 capsule (0.4 mg total) by mouth daily.       Review of Systems  Constitutional: Negative for activity change, chills, diaphoresis, fatigue and fever.  HENT: Negative for congestion, rhinorrhea, sinus pain, sinus pressure, sneezing and sore throat.   Eyes: Negative.   Respiratory: Negative for cough, chest tightness, shortness of breath and wheezing.   Cardiovascular: Negative for chest pain, palpitations and leg swelling.  Gastrointestinal: Negative for abdominal distention, abdominal pain, constipation, diarrhea, nausea and vomiting.  Endocrine: Negative.   Genitourinary: Negative for dysuria, flank pain, frequency and urgency.  Musculoskeletal: Positive for gait problem.  Skin: Negative  for color change, pallor and rash.       Mid chest and right calf muscle surgical incision healed    Neurological: Negative for dizziness, seizures, light-headedness and headaches.  Hematological: Does not bruise/bleed easily.  Psychiatric/Behavioral: Negative for agitation, confusion, hallucinations and sleep disturbance. The patient is not nervous/anxious.     Vitals:   03/17/17 1053  BP: 119/68  Pulse: 70  Resp: 19  Temp: 98 F (36.7 C)  SpO2: 94%  Weight: 150 lb (68 kg)  Height: 5\' 5"  (1.651 m)   Body mass index is 24.96 kg/m. Physical Exam  Constitutional: He is oriented to person, place, and time. He appears well-developed and well-nourished. No distress.  HENT:  Head: Normocephalic.  Mouth/Throat: Oropharynx is clear and moist. No oropharyngeal exudate.  Eyes: Conjunctivae and EOM are normal. Pupils are equal, round, and reactive to light. Right eye exhibits no discharge. Left eye exhibits no discharge. No scleral icterus.  Neck: Normal range of motion. No JVD present. No thyromegaly present.  Cardiovascular: Normal rate, regular rhythm, normal heart  sounds and intact distal pulses.  Exam reveals no gallop and no friction rub.   No murmur heard. Pulmonary/Chest: Effort normal. No respiratory distress. He has no wheezes. He has no rales.  Clear diminished breath sounds. Oxygen 2 L via [place] in place.   Abdominal: Soft. Bowel sounds are normal. He exhibits no distension. There is no tenderness. There is no rebound and no guarding.  Genitourinary:  Genitourinary Comments: Continent   Musculoskeletal: He exhibits no edema, tenderness or deformity.   Unsteady gait uses FWW  Lymphadenopathy:    He has no cervical adenopathy.  Neurological: He is oriented to person, place, and time.  Skin: Skin is warm and dry. No rash noted. No erythema. No pallor.  Mid chest and right calf surgical incision healed no signs of infections.   Psychiatric: He has a normal mood and affect.    Labs reviewed: Basic Metabolic Panel:  Recent Labs  02/15/17 0327  02/15/17 1616  02/23/17 0213 02/24/17 1959 02/25/17 0246 03/01/17  NA 135  --   --   < > 133* 128* 133* 142  K 4.0  --   --   < > 3.6 3.6 3.9 4.3  CL 104  --   --   < > 96* 88* 94*  --   CO2 25  --   --   < > 28 27 28   --   GLUCOSE 148*  --   --   < > 103* 115* 101*  --   BUN 12  --   --   < > 23* 23* 21* 29*  CREATININE 0.84  < > 1.01  < > 1.06 1.19 1.11 0.9  CALCIUM 7.8*  --   --   < > 8.2* 8.2* 8.2*  --   MG 2.5*  --  2.3  --   --   --   --   --   < > = values in this interval not displayed. Liver Function Tests:  Recent Labs  03/01/17  AST 23  ALT 36  ALKPHOS 86   CBC:  Recent Labs  02/21/17 0227 02/24/17 1959 02/25/17 0246 03/01/17  WBC 8.2 13.5* 11.5* 8.6  HGB 8.3* 9.6* 8.9* 9.5*  HCT 25.5* 29.7* 27.4* 30*  MCV 92.4 92.8 93.2  --   PLT 245 350 347 407*   Cardiac Enzymes:  Recent Labs  02/13/17 2212 02/14/17 0138 02/14/17 0843  TROPONINI >  65.00* >65.00* 50.17*    Recent Labs  02/20/17 2013 02/21/17 0656 02/24/17 1943  GLUCAP 110* 108* 115*     Assessment/Plan:   1. Unsteady gait  Has worked well with PT/ OT. He will be discharged home outpatient therapy at [facility] to continue with ROM, Exercise, Gait stability and muscle strengthening. He will  require DME stationary and portable gaseous continuous oxygen 2 Liters via Nasal cannula to keep oxygen saturation above 90%. Fall and safety precautions.  2. Mixed hyperlipidemia Continue on heart healthy diet. continue atorvastatin 80 mg Tablet daily. Monitor lipid panel.   3. Hypokalemia Continue on Potassium chloride 10 meq tablet daily.   4. Pulmonary emphysema With COPD,status post stapling of bleb to LUL. Continue on  duoneb q6h scheduled for 1 week, then q6h prn and monitor.He will  require DME stationary and portable gaseous continuous oxygen 2 Liters via Nasal cannula to keep oxygen saturation above 90%. Continue to use incentive spirometer 6-8 times per day while awake. CBC/diff in 1-2 weeks with PCP.   5. Iron deficiency anemia Hgb stable.Continue on ferrous sulfate 325 mg Tablet daily.CBC in 1-2 weeks with PCP.   Patient is being discharged with the following home health services:   - outpatient therapy at [facility] to continue with ROM, Exercise, Gait stability and muscle strengthening.   Patient is being discharged with the following durable medical equipment:   -  stationary and portable gaseous continuous oxygen 2 Liters via Nasal cannula to keep oxygen saturation above 90%.  Patient has been advised to f/u with their PCP in 1-2 weeks to for a transitions of care visit.Social services at their facility was responsible for arranging this appointment.  Pt was provided with adequate prescriptions of noncontrolled medications to reach the scheduled appointment.For controlled substances, a limited supply was provided as appropriate for the individual patient. If the pt normally receives these medications from a pain clinic or has a contract with another  physician, these medications should be received from that clinic or physician only).    Future labs/tests needed:  CBC, BMP in 1-2 weeks PCP

## 2017-03-24 ENCOUNTER — Other Ambulatory Visit: Payer: Self-pay | Admitting: Cardiothoracic Surgery

## 2017-03-24 DIAGNOSIS — Z951 Presence of aortocoronary bypass graft: Secondary | ICD-10-CM

## 2017-03-27 ENCOUNTER — Ambulatory Visit (INDEPENDENT_AMBULATORY_CARE_PROVIDER_SITE_OTHER): Payer: Self-pay | Admitting: Physician Assistant

## 2017-03-27 ENCOUNTER — Ambulatory Visit
Admission: RE | Admit: 2017-03-27 | Discharge: 2017-03-27 | Disposition: A | Discharge status: Home / Self Care | Payer: Medicare Other | Source: Ambulatory Visit | Attending: Cardiothoracic Surgery | Admitting: Cardiothoracic Surgery

## 2017-03-27 VITALS — BP 115/70 | HR 100 | Resp 20 | Ht 65.0 in | Wt 150.5 lb

## 2017-03-27 DIAGNOSIS — Z951 Presence of aortocoronary bypass graft: Secondary | ICD-10-CM

## 2017-03-27 NOTE — Patient Instructions (Signed)
You are encouraged to enroll and participate in the outpatient cardiac rehab program beginning as soon as practical.  You may return to driving an automobile as long as you are no longer requiring oral narcotic pain relievers during the daytime.  It would be wise to start driving only short distances during the daylight and gradually increase from there as you feel comfortable.   Make every effort to stay physically active, get some type of exercise on a regular basis, and stick to a "heart healthy diet".  The long term benefits for regular exercise and a healthy diet are critically important to your overall health and wellbeing.   You may continue to gradually increase your physical activity as tolerated.  Refrain from any heavy lifting or strenuous use of your arms and shoulders until at least 8 weeks from the time of your surgery, and avoid activities that cause increased pain in your chest on the side of your surgical incision.  Otherwise you may continue to increase activities without any particular limitations.  Increase the intensity and duration of physical activity gradually.

## 2017-03-27 NOTE — Progress Notes (Signed)
HPI:  Patient returns for routine postoperative follow-up having undergone CABG x 2 on 02/15/2017.  He initially was discharged home, but he presented to the ED later the same day with complaints of weakness prompting re-admission and arrangement of SNF placement. The patient's early postoperative recovery while in the hospital was notable for hypoxia and he ultimately required home oxygen.  Since hospital discharge the patient reports he is doing okay.  He states that he has been having low blood pressure issues.  He states he was dizzy and lightheaded in SNF and they instructed him to not take his Lopressor if his BP was less than 110.  Currently he states he mainly requires this medication 2-3 x per week.  He remains on home oxygen with good saturations.  He states home nursing was not arranged at discharge from SNF and there has been no weaning attempts since discharge.  He was confused about medications and these were explained and clarified to the patient and his son.   Current Outpatient Prescriptions  Medication Sig Dispense Refill  . atorvastatin (LIPITOR) 80 MG tablet Take 1 tablet (80 mg total) by mouth daily at 6 PM. 30 tablet 1  . bisacodyl (DULCOLAX) 5 MG EC tablet Take 2 tablets (10 mg total) by mouth daily as needed for moderate constipation (Give daily if no BM). 30 tablet 0  . furosemide (LASIX) 20 MG tablet Take 20 mg by mouth daily.    [name] guaiFENesin (MUCINEX) 600 MG 12 hr tablet Take by mouth 2 (two) times daily.    [name] ipratropium-albuterol (DUONEB) 0.5-2.5 (3) MG/3ML SOLN Take 3 mLs by nebulization every 6 (six) hours as needed. When awake     . metoprolol tartrate (LOPRESSOR) 25 MG tablet Take 12.5 mg by mouth 2 (two) times daily. Hold for SBP< 110 or HR <60    . OXYGEN Inhale into the lungs. By nasal cannula and wean as tolerated to keep O2 sats >90%    . potassium chloride (K-DUR) 10 MEQ tablet Take 10 mEq by mouth daily.    . tamsulosin (FLOMAX) 0.4 MG CAPS capsule Take 1  capsule (0.4 mg total) by mouth daily. 30 capsule 1  . acetaminophen (TYLENOL) 500 MG tablet Take 500 mg by mouth every 6 (six) hours as needed.    [name] aspirin 325 MG EC tablet Take 1 tablet (325 mg total) by mouth daily. (Patient not taking: Reported on 03/27/2017) 30 tablet 0   No current facility-administered medications for this visit.     Physical Exam:  BP 115/70   Pulse 100   Resp 20   Ht 5\' 5"  (1.651 m)   Wt 150 lb 8 oz (68.3 kg)   SpO2 98% Comment: 2L O2 per [place]  BMI 25.04 kg/m   Gen: no apparent distress Heart: RRR Lungs: CTA Ext: no edema Incisions: clean and dry, well healed  Diagnostic Tests:  CXR; no pleural effusions, or pneumothorax, sternal wires in place  A/P:  1. S/p CABG x 2 doing well, scheduled to start cardiac rehab this week 2. Hypotension- patient with low blood pressure most of the time..... Parameters provided for Lopressor, will continue for now but patient is only using 2-3 times per week... May be best to discontinue medication, but will defer to cardiology 3. Pulmonary- Hypoxia, saturations off oxygen in office today remained above 93%, will plan to d/c constant use of oxygen, patient will monitor sats with activity and during night... If develops dyspnea and low oxygen saturations can  restart oxygen use.. Otherwise will contact our office to discontinue 4. Medications- reviewed, instructed patient to restart ASA 5. Post nasal drip/cough- explained to patient mechanism of his cough in morning due to post nasal drip.  He was instructed to use nasal saline for dry nose and antihistamine for nasal drainage 6. Dispo- patient doing very well, continue current medications for now, may need to d/c Lopressor if remains hypotensive, ok to start cardiac rehab, RTC prn  Ellwood Handler, PA-C [facility] [phone]

## 2017-03-30 ENCOUNTER — Encounter: Payer: Medicare Other | Attending: Internal Medicine | Admitting: *Deleted

## 2017-03-30 VITALS — Ht 64.0 in | Wt 152.1 lb

## 2017-03-30 DIAGNOSIS — Z79899 Other long term (current) drug therapy: Secondary | ICD-10-CM | POA: Diagnosis not present

## 2017-03-30 DIAGNOSIS — Z951 Presence of aortocoronary bypass graft: Secondary | ICD-10-CM | POA: Diagnosis not present

## 2017-03-30 DIAGNOSIS — I214 Non-ST elevation (NSTEMI) myocardial infarction: Secondary | ICD-10-CM | POA: Insufficient documentation

## 2017-03-30 NOTE — Progress Notes (Signed)
Cardiac Individual Treatment Plan  Patient Details  Name: Thomas Trujillo MRN: [id number] Date of Birth: Oct 09, 1946 Referring Provider:     Cardiac Rehab from 03/30/2017 in [facility] Cardiac and Pulmonary Rehab  Referring Provider  End      Initial Encounter Date:    Cardiac Rehab from 03/30/2017 in [facility] Cardiac and Pulmonary Rehab  Date  03/30/17  Referring Provider  End      Visit Diagnosis: S/P CABG x 2  NSTEMI (non-ST elevated myocardial infarction) [facility])  Patient's Home Medications on Admission:  Current Outpatient Prescriptions:  .  acetaminophen (TYLENOL) 500 MG tablet, Take 500 mg by mouth every 6 (six) hours as needed., Disp: , Rfl:  .  atorvastatin (LIPITOR) 80 MG tablet, Take 1 tablet (80 mg total) by mouth daily at 6 PM., Disp: 30 tablet, Rfl: 1 .  bisacodyl (DULCOLAX) 5 MG EC tablet, Take 2 tablets (10 mg total) by mouth daily as needed for moderate constipation (Give daily if no BM)., Disp: 30 tablet, Rfl: 0 .  furosemide (LASIX) 20 MG tablet, Take 20 mg by mouth daily., Disp: , Rfl:  .  guaiFENesin (MUCINEX) 600 MG 12 hr tablet, Take by mouth 2 (two) times daily., Disp: , Rfl:  .  ipratropium-albuterol (DUONEB) 0.5-2.5 (3) MG/3ML SOLN, Take 3 mLs by nebulization every 6 (six) hours as needed. When awake , Disp: , Rfl:  .  metoprolol tartrate (LOPRESSOR) 25 MG tablet, Take 12.5 mg by mouth 2 (two) times daily. Hold for SBP< 110 or HR <60, Disp: , Rfl:  .  OXYGEN, Inhale into the lungs. By nasal cannula and wean as tolerated to keep O2 sats >90%, Disp: , Rfl:  .  potassium chloride (K-DUR) 10 MEQ tablet, Take 10 mEq by mouth daily., Disp: , Rfl:  .  tamsulosin (FLOMAX) 0.4 MG CAPS capsule, Take 1 capsule (0.4 mg total) by mouth daily., Disp: 30 capsule, Rfl: 1 .  aspirin 325 MG EC tablet, Take 1 tablet (325 mg total) by mouth daily., Disp: 30 tablet, Rfl: 0  Past Medical History: Past Medical History:  Diagnosis Date  . Patient denies medical problems     Tobacco  Use: History  Smoking Status  . Former Smoker  Smokeless Tobacco  . Never Used    Labs: Recent [employer] for ITP Cardiac and Pulmonary Rehab Latest Ref Rng & Units 02/15/2017 02/15/2017 02/15/2017 02/18/2017 02/18/2017   Cholestrol 0 - 200 mg/dL - - - - -   LDLCALC 0 - 99 mg/dL - - - - -   HDL >40 mg/dL - - - - -   Trlycerides <150 mg/dL - - - - -   Hemoglobin A1c 4.8 - 5.6 % - - - - -   PHART 7.350 - 7.450 7.316(L) 7.344(L) - 7.393 -   PCO2ART 32.0 - 48.0 mmHg 46.8 42.7 - 50.1(H) -   HCO3 20.0 - 28.0 mmol/L 23.7 23.3 - 30.5(H) -   TCO2 0 - 100 mmol/L 25 25 30  32 30   ACIDBASEDEF 0.0 - 2.0 mmol/L 2.0 2.0 - - -   O2SAT % 96.0 96.0 - 98.0 -       Exercise Target Goals: Date: 03/30/17  Exercise Program Goal: Individual exercise prescription set with THRR, safety & activity barriers. Participant demonstrates ability to understand and report RPE using BORG scale, to self-measure pulse accurately, and to acknowledge the importance of the exercise prescription.  Exercise Prescription Goal: Starting with aerobic activity 30 plus  minutes a day, 3 days per week for initial exercise prescription. Provide home exercise prescription and guidelines that participant acknowledges understanding prior to discharge.  Activity Barriers & Risk Stratification:   6 Minute Walk:     6 Minute Walk    Row Name 03/30/17 1528         6 Minute Walk   Distance 810 feet     Walk Time 4.5 minutes     # of Rest Breaks 2     MPH 2.04     METS 2.6     RPE 17     Perceived Dyspnea  3     VO2 Peak 9.11     Symptoms No     Resting HR 106 bpm     Resting BP 102/56     Max Ex. HR 126 bpm     Max Ex. BP 162/60     2 Minute Post BP 112/58       Interval Oxygen   Interval Oxygen? Yes     Baseline Oxygen Saturation % 97 %     Baseline Liters of Oxygen 2 L     1 Minute Oxygen Saturation % 96 %     1 Minute Liters of Oxygen 2 L     2 Minute Oxygen Saturation % 94 %     2 Minute  Liters of Oxygen 2 L     3 Minute Liters of Oxygen 2 L     4 Minute Oxygen Saturation % 93 %     4 Minute Liters of Oxygen 2 L     5 Minute Liters of Oxygen 2 L     6 Minute Oxygen Saturation % 94 %     6 Minute Liters of Oxygen 2 L     2 Minute Post Liters of Oxygen 2 L        Oxygen Initial Assessment:     Oxygen Initial Assessment - 03/30/17 1540      Initial 6 min Walk   Resting Oxygen Saturation  during 6 min walk 96 %   Exercise Oxygen Saturation  during 6 min walk 94 %     Program Oxygen Prescription   Program Oxygen Prescription Continuous   Liters per minute 2   Comments Thomas Trujillo has been told he does not need oxygen.  Sat dropped to 85% when he started walk test.  PLaced oxygen on at 2l     Intervention   Short Term Goals To learn and exhibit compliance with exercise, home and travel O2 prescription;To learn and understand importance of monitoring SPO2 with pulse oximeter and demonstrate accurate use of the pulse oximeter.;To Learn and understand importance of maintaining oxygen saturations>88%   Long  Term Goals Exhibits compliance with exercise, home and travel O2 prescription;Verbalizes importance of monitoring SPO2 with pulse oximeter and return demonstration;Maintenance of O2 saturations>88%;Exhibits proper breathing techniques, such as purse lipped breathing or other method taught during program session      Oxygen Re-Evaluation:   Oxygen Discharge (Final Oxygen Re-Evaluation):   Initial Exercise Prescription:     Initial Exercise Prescription - 03/30/17 1500      Date of Initial Exercise RX and Referring Provider   Date 03/30/17   Referring Provider End     Treadmill   MPH 2   Grade 0   Minutes 15   METs 2.53     Recumbant Bike   Level 1   RPM 50   Watts 13  Minutes 15   METs 2.5     NuStep   Level 2   SPM 80   Minutes 15   METs 2.5     Biostep-RELP   Level 2   SPM 50   Minutes 15   METs 2     Prescription Details   Frequency  (times per week) 3   Duration Progress to 30 minutes of continuous aerobic without signs/symptoms of physical distress     Intensity   THRR 40-80% of Max Heartrate 123-141   Ratings of Perceived Exertion 11-13   Perceived Dyspnea 0-4     Resistance Training   Training Prescription Yes   Weight 3   Reps 10-15      Perform Capillary Blood Glucose checks as needed.  Exercise Prescription Changes:     Exercise Prescription Changes    Row Name 03/30/17 1500             Response to Exercise   Blood Pressure (Admit) 102/56       Blood Pressure (Exercise) 162/60       Blood Pressure (Exit) 112/58       Heart Rate (Admit) 109 bpm       Heart Rate (Exercise) 129 bpm       Heart Rate (Exit) 111 bpm       Oxygen Saturation (Admit) 96 %       Oxygen Saturation (Exercise) 94 %          Exercise Comments:   Exercise Goals and Review:     Exercise Goals    Row Name 03/30/17 1528             Exercise Goals   Increase Physical Activity Yes       Intervention Provide advice, education, support and counseling about physical activity/exercise needs.;Develop an individualized exercise prescription for aerobic and resistive training based on initial evaluation findings, risk stratification, comorbidities and participant's personal goals.       Expected Outcomes Achievement of increased cardiorespiratory fitness and enhanced flexibility, muscular endurance and strength shown through measurements of functional capacity and personal statement of participant.       Increase Strength and Stamina Yes       Intervention Provide advice, education, support and counseling about physical activity/exercise needs.;Develop an individualized exercise prescription for aerobic and resistive training based on initial evaluation findings, risk stratification, comorbidities and participant's personal goals.       Expected Outcomes Achievement of increased cardiorespiratory fitness and enhanced  flexibility, muscular endurance and strength shown through measurements of functional capacity and personal statement of participant.          Exercise Goals Re-Evaluation :   Discharge Exercise Prescription (Final Exercise Prescription Changes):     Exercise Prescription Changes - 03/30/17 1500      Response to Exercise   Blood Pressure (Admit) 102/56   Blood Pressure (Exercise) 162/60   Blood Pressure (Exit) 112/58   Heart Rate (Admit) 109 bpm   Heart Rate (Exercise) 129 bpm   Heart Rate (Exit) 111 bpm   Oxygen Saturation (Admit) 96 %   Oxygen Saturation (Exercise) 94 %      Nutrition:  Target Goals: Understanding of nutrition guidelines, daily intake of sodium 1500mg , cholesterol 200mg , calories 30% from fat and 7% or less from saturated fats, daily to have 5 or more servings of fruits and vegetables.  Biometrics:     Pre Biometrics - 03/30/17 1527      Pre Biometrics  Height 5\' 4"  (1.626 m)   Weight 152 lb 1.6 oz (69 kg)   Waist Circumference 37.25 inches   Hip Circumference 39.5 inches   Waist to Hip Ratio 0.94 %   BMI (Calculated) 26.2   Single Leg Stand 30 seconds       Nutrition Therapy Plan and Nutrition Goals:     Nutrition Therapy & Goals - 03/30/17 1545      Intervention Plan   Intervention Prescribe, educate and counsel regarding individualized specific dietary modifications aiming towards targeted core components such as weight, hypertension, lipid management, diabetes, heart failure and other comorbidities.   Expected Outcomes Short Term Goal: Understand basic principles of dietary content, such as calories, fat, sodium, cholesterol and nutrients.;Short Term Goal: A plan has been developed with personal nutrition goals set during dietitian appointment.;Long Term Goal: Adherence to prescribed nutrition plan.      Nutrition Discharge: Rate Your Plate Scores:     Nutrition Assessments - 03/30/17 1545      MEDFICTS Scores   Pre Score 6       Nutrition Goals Re-Evaluation:   Nutrition Goals Discharge (Final Nutrition Goals Re-Evaluation):   Psychosocial: Target Goals: Acknowledge presence or absence of significant depression and/or stress, maximize coping skills, provide positive support system. Participant is able to verbalize types and ability to use techniques and skills needed for reducing stress and depression.   Initial Review & Psychosocial Screening:     Initial Psych Review & Screening - 03/30/17 1547      Initial Review   Current issues with Current Sleep Concerns  Sleeping in recliner so he can breath easier. Was not sleeping well prior to surgery. LIves in first floor apartment and noise from second floor would wake him up and messed his schedule up for being up and sleep.  Since came home has oxygen concentrator     Family Dynamics   Good Support System? Yes  son, son's church   Comments Sleeping better since oxygen concentrator in use.  Blocks out the noise from the apartment above him.      Barriers   Psychosocial barriers to participate in program There are no identifiable barriers or psychosocial needs.;The patient should benefit from training in stress management and relaxation.     Screening Interventions   Interventions Encouraged to exercise;To provide support and resources with identified psychosocial needs;Provide feedback about the scores to participant;Program counselor consult      Quality of Life Scores:      Quality of Life - 03/30/17 1549      Quality of Life Scores   Health/Function Pre 23.46 %   Socioeconomic Pre 29 %   Psych/Spiritual Pre 30 %   Family Pre 28.5 %   GLOBAL Pre 26.58 %      PHQ-9: Recent Review Flowsheet Data    Depression screen [facility] 2/9 03/30/2017   Decreased Interest 2   Down, Depressed, Hopeless 0   PHQ - 2 Score 2   Altered sleeping 3    Tired, decreased energy 3    Change in appetite 1    Feeling bad or failure about yourself  0   Trouble  concentrating 0   Moving slowly or fidgety/restless 0   Suicidal thoughts 0   PHQ-9 Score 9   Difficult doing work/chores Not difficult at all     Interpretation of Total Score  Total Score Depression Severity:  1-4 = Minimal depression, 5-9 = Mild depression, 10-14 = Moderate depression, 15-19 =  Moderately severe depression, 20-27 = Severe depression   Psychosocial Evaluation and Intervention:   Psychosocial Re-Evaluation:   Psychosocial Discharge (Final Psychosocial Re-Evaluation):   Vocational Rehabilitation: Provide vocational rehab assistance to qualifying candidates.   Vocational Rehab Evaluation & Intervention:     Vocational Rehab - 03/30/17 1552      Initial Vocational Rehab Evaluation & Intervention   Assessment shows need for Vocational Rehabilitation No      Education: Education Goals: Education classes will be provided on a weekly basis, covering required topics. Participant will state understanding/return demonstration of topics presented.  Learning Barriers/Preferences:     Learning Barriers/Preferences - 03/30/17 1552      Learning Barriers/Preferences   Learning Barriers None   Learning Preferences Skilled Demonstration;Pictoral;Individual Instruction;Written Material;Video      Education Topics: General Nutrition Guidelines/Fats and Fiber: -Group instruction provided by verbal, written material, models and posters to present the general guidelines for heart healthy nutrition. Gives an explanation and review of dietary fats and fiber.   Controlling Sodium/Reading Food Labels: -Group verbal and written material supporting the discussion of sodium use in heart healthy nutrition. Review and explanation with models, verbal and written materials for utilization of the food label.   Exercise Physiology & Risk Factors: - Group verbal and written instruction with models to review the exercise physiology of the cardiovascular system and associated  critical values. Details cardiovascular disease risk factors and the goals associated with each risk factor.   Aerobic Exercise & Resistance Training: - Gives group verbal and written discussion on the health impact of inactivity. On the components of aerobic and resistive training programs and the benefits of this training and how to safely progress through these programs.   Flexibility, Balance, General Exercise Guidelines: - Provides group verbal and written instruction on the benefits of flexibility and balance training programs. Provides general exercise guidelines with specific guidelines to those with heart or lung disease. Demonstration and skill practice provided.   Stress Management: - Provides group verbal and written instruction about the health risks of elevated stress, cause of high stress, and healthy ways to reduce stress.   Depression: - Provides group verbal and written instruction on the correlation between heart/lung disease and depressed mood, treatment options, and the stigmas associated with seeking treatment.   Anatomy & Physiology of the Heart: - Group verbal and written instruction and models provide basic cardiac anatomy and physiology, with the coronary electrical and arterial systems. Review of: AMI, Angina, Valve disease, Heart Failure, Cardiac Arrhythmia, Pacemakers, and the ICD.   Cardiac Procedures: - Group verbal and written instruction and models to describe the testing methods done to diagnose heart disease. Reviews the outcomes of the test results. Describes the treatment choices: Medical Management, Angioplasty, or Coronary Bypass Surgery.   Cardiac Medications: - Group verbal and written instruction to review commonly prescribed medications for heart disease. Reviews the medication, class of the drug, and side effects. Includes the steps to properly store meds and maintain the prescription regimen.   Go Sex-Intimacy & Heart Disease, Get SMART -  Goal Setting: - Group verbal and written instruction through game format to discuss heart disease and the return to sexual intimacy. Provides group verbal and written material to discuss and apply goal setting through the application of the S.M.A.R.T. Method.   Other Matters of the Heart: - Provides group verbal, written materials and models to describe Heart Failure, Angina, Valve Disease, and Diabetes in the realm of heart disease. Includes description of the disease  process and treatment options available to the cardiac patient.   Exercise & Equipment Safety: - Individual verbal instruction and demonstration of equipment use and safety with use of the equipment.   Cardiac Rehab from 03/30/2017 in [facility] Cardiac and Pulmonary Rehab  Date  03/30/17  Educator  SB  Instruction Review Code  2- meets goals/outcomes      Infection Prevention: - Provides verbal and written material to individual with discussion of infection control including proper hand washing and proper equipment cleaning during exercise session.   Cardiac Rehab from 03/30/2017 in [facility] Cardiac and Pulmonary Rehab  Date  03/30/17  Educator  Sb  Instruction Review Code  2- meets goals/outcomes      Falls Prevention: - Provides verbal and written material to individual with discussion of falls prevention and safety.   Cardiac Rehab from 03/30/2017 in [facility] Cardiac and Pulmonary Rehab  Date  03/30/17  Educator  SB  Instruction Review Code  2- meets goals/outcomes      Diabetes: - Individual verbal and written instruction to review signs/symptoms of diabetes, desired ranges of glucose level fasting, after meals and with exercise. Advice that pre and post exercise glucose checks will be done for 3 sessions at entry of program.    Knowledge Questionnaire Score:     Knowledge Questionnaire Score - 03/30/17 1552      Knowledge Questionnaire Score   Pre Score --  Has not completed this yet      Core Components/Risk  Factors/Patient Goals at Admission:     Personal Goals and Risk Factors at Admission - 03/30/17 1546      Core Components/Risk Factors/Patient Goals on Admission   Lipids Yes   Intervention Provide education and support for participant on nutrition & aerobic/resistive exercise along with prescribed medications to achieve LDL 70mg , HDL >40mg .   Expected Outcomes Short Term: Participant states understanding of desired cholesterol values and is compliant with medications prescribed. Participant is following exercise prescription and nutrition guidelines.;Long Term: Cholesterol controlled with medications as prescribed, with individualized exercise RX and with personalized nutrition plan. Value goals: LDL < 70mg , HDL > 40 mg.      Core Components/Risk Factors/Patient Goals Review:    Core Components/Risk Factors/Patient Goals at Discharge (Final Review):    ITP Comments:     ITP Comments    Row Name 03/30/17 1537           ITP Comments Medical review completed, INitial ITP created   Documentation of Diagnosis can be found in [facility] 5/4 admission          Comments:

## 2017-03-30 NOTE — Patient Instructions (Signed)
Patient Instructions  Patient Details  Name: Thomas Trujillo MRN: [id number] Date of Birth: January 26, 1946 Referring Provider:  [name], MD  Below are the personal goals you chose as well as exercise and nutrition goals. Our goal is to help you keep on track towards obtaining and maintaining your goals. We will be discussing your progress on these goals with you throughout the program.  Initial Exercise Prescription:     Initial Exercise Prescription - 03/30/17 1500      Date of Initial Exercise RX and Referring Provider   Date 03/30/17   Referring Provider End     Treadmill   MPH 2   Grade 0   Minutes 15   METs 2.53     Recumbant Bike   Level 1   RPM 50   Watts 13   Minutes 15   METs 2.5     NuStep   Level 2   SPM 80   Minutes 15   METs 2.5     Biostep-RELP   Level 2   SPM 50   Minutes 15   METs 2     Prescription Details   Frequency (times per week) 3   Duration Progress to 30 minutes of continuous aerobic without signs/symptoms of physical distress     Intensity   THRR 40-80% of Max Heartrate 123-141   Ratings of Perceived Exertion 11-13   Perceived Dyspnea 0-4     Resistance Training   Training Prescription Yes   Weight 3   Reps 10-15      Exercise Goals: Frequency: Be able to perform aerobic exercise three times per week working toward 3-5 days per week.  Intensity: Work with a perceived exertion of 11 (fairly light) - 15 (hard) as tolerated. Follow your new exercise prescription and watch for changes in prescription as you progress with the program. Changes will be reviewed with you when they are made.  Duration: You should be able to do 30 minutes of continuous aerobic exercise in addition to a 5 minute warm-up and a 5 minute cool-down routine.  Nutrition Goals: Your personal nutrition goals will be established when you do your nutrition analysis with the dietician.  The following are nutrition guidelines to follow: Cholesterol <  200mg /day Sodium < 1500mg /day Fiber: Men over 50 yrs - 30 grams per day  Personal Goals:     Personal Goals and Risk Factors at Admission - 03/30/17 1546      Core Components/Risk Factors/Patient Goals on Admission   Lipids Yes   Intervention Provide education and support for participant on nutrition & aerobic/resistive exercise along with prescribed medications to achieve LDL 70mg , HDL >40mg .   Expected Outcomes Short Term: Participant states understanding of desired cholesterol values and is compliant with medications prescribed. Participant is following exercise prescription and nutrition guidelines.;Long Term: Cholesterol controlled with medications as prescribed, with individualized exercise RX and with personalized nutrition plan. Value goals: LDL < 70mg , HDL > 40 mg.      Tobacco Use Initial Evaluation: History  Smoking Status  . Former Smoker  Smokeless Tobacco  . Never Used    Copy of goals given to participant.

## 2017-03-30 NOTE — Progress Notes (Signed)
Daily Session Note  Patient Details  Name: Thomas Trujillo MRN: [id number] Date of Birth: Sep 19, 1946 Referring Provider:     Cardiac Rehab from 03/30/2017 in [facility] Cardiac and Pulmonary Rehab  Referring Provider  End      Encounter Date: 03/30/2017  Check In:     Session Check In - 03/30/17 1536      Check-In   Location [facility]-Cardiac & Pulmonary Rehab   Staff Present Heath Lark, RN, BSN, Lance Sell, BA, ACSM CEP, Exercise Physiologist   Supervising physician immediately available to respond to emergencies See telemetry face sheet for immediately available ER MD   Medication changes reported     No   Fall or balance concerns reported    No   Warm-up and Cool-down Performed as group-led instruction   Resistance Training Performed Yes   VAD Patient? No     Pain Assessment   Currently in Pain? No/denies           Exercise Prescription Changes - 03/30/17 1500      Response to Exercise   Blood Pressure (Admit) 102/56   Blood Pressure (Exercise) 162/60   Blood Pressure (Exit) 112/58   Heart Rate (Admit) 109 bpm   Heart Rate (Exercise) 129 bpm   Heart Rate (Exit) 111 bpm   Oxygen Saturation (Admit) 96 %   Oxygen Saturation (Exercise) 94 %      History  Smoking Status  . Former Smoker  Smokeless Tobacco  . Never Used    Goals Met:  Exercise tolerated well Personal goals reviewed No report of cardiac concerns or symptoms  Goals Unmet:  Not Applicable  Comments: initial medical review completed today   Dr. Emily Filbert is Medical Director for [place] and [facility].

## 2017-04-03 ENCOUNTER — Encounter: Payer: Medicare Other | Admitting: *Deleted

## 2017-04-03 DIAGNOSIS — I214 Non-ST elevation (NSTEMI) myocardial infarction: Secondary | ICD-10-CM | POA: Diagnosis not present

## 2017-04-03 DIAGNOSIS — Z951 Presence of aortocoronary bypass graft: Secondary | ICD-10-CM

## 2017-04-03 NOTE — Progress Notes (Signed)
Daily Session Note  Patient Details  Name: Camillo Quadros MRN: [id number] Date of Birth: 07-09-1946 Referring Provider:     Cardiac Rehab from 03/30/2017 in [facility] Cardiac and Pulmonary Rehab  Referring Provider  End      Encounter Date: 04/03/2017  Check In:     Session Check In - 04/03/17 0807      Check-In   Location [facility]-Cardiac & Pulmonary Rehab   Staff Present Earlean Shawl, BS, ACSM CEP, Exercise Physiologist;Jessica Luan Pulling, MA, ACSM RCEP, Exercise Physiologist;Carroll Enterkin, RN, BSN   Supervising physician immediately available to respond to emergencies See telemetry face sheet for immediately available ER MD   Medication changes reported     No   Fall or balance concerns reported    No   Warm-up and Cool-down Performed on first and last piece of equipment   Resistance Training Performed Yes   VAD Patient? No     Pain Assessment   Currently in Pain? No/denies   Multiple Pain Sites No         History  Smoking Status  . Former Smoker  Smokeless Tobacco  . Never Used    Goals Met:  Independence with exercise equipment Exercise tolerated well Personal goals reviewed No report of cardiac concerns or symptoms Strength training completed today  Goals Unmet:  Not Applicable  Comments: First full day of exercise!  Patient was oriented to gym and equipment including functions, settings, policies, and procedures.  Patient's individual exercise prescription and treatment plan were reviewed.  All starting workloads were established based on the results of the 6 minute walk test done at initial orientation visit.  The plan for exercise progression was also introduced and progression will be customized based on patient's performance and goals.    Dr. Emily Filbert is Medical Director for [facility] and [facility].

## 2017-04-05 ENCOUNTER — Ambulatory Visit (INDEPENDENT_AMBULATORY_CARE_PROVIDER_SITE_OTHER): Payer: Medicare Other | Admitting: Internal Medicine

## 2017-04-05 ENCOUNTER — Encounter: Payer: Self-pay | Admitting: Internal Medicine

## 2017-04-05 VITALS — BP 100/60 | HR 90 | Ht 64.0 in | Wt 150.8 lb

## 2017-04-05 DIAGNOSIS — E782 Mixed hyperlipidemia: Secondary | ICD-10-CM

## 2017-04-05 DIAGNOSIS — I255 Ischemic cardiomyopathy: Secondary | ICD-10-CM | POA: Insufficient documentation

## 2017-04-05 DIAGNOSIS — I2510 Atherosclerotic heart disease of native coronary artery without angina pectoris: Secondary | ICD-10-CM

## 2017-04-05 DIAGNOSIS — M25512 Pain in left shoulder: Secondary | ICD-10-CM | POA: Diagnosis not present

## 2017-04-05 DIAGNOSIS — R0602 Shortness of breath: Secondary | ICD-10-CM

## 2017-04-05 DIAGNOSIS — Z951 Presence of aortocoronary bypass graft: Secondary | ICD-10-CM

## 2017-04-05 DIAGNOSIS — I214 Non-ST elevation (NSTEMI) myocardial infarction: Secondary | ICD-10-CM

## 2017-04-05 DIAGNOSIS — E785 Hyperlipidemia, unspecified: Secondary | ICD-10-CM | POA: Insufficient documentation

## 2017-04-05 MED ORDER — ATORVASTATIN CALCIUM 40 MG PO TABS: 40.0000 mg | ORAL_TABLET | Freq: Every day | ORAL | 3 refills | Status: DC

## 2017-04-05 NOTE — Progress Notes (Signed)
Follow-up Outpatient Visit Date: 04/05/2017  Primary Care Provider: [name], MD [place] [facility] [place] [address]  Chief Complaint: Follow-up coronary artery disease  HPI:  [name] is a 71 y.o. year-old male with history of coronary artery disease with recent NSTEMI and urgent CABG in 01/2017, who presents for follow-up of coronary artery disease. He presented to [facility] on 02/14/17 with 1-2 days of chest pain and accompanying shortness of breath, with troponin peaking > 65. Coronary angiography revealed severe ostial and mid LAD stenoses as well as high-grade ramus stenosis with moderately reduced LVEF. He underwent successful CABG x 2. His hospitalization was complicated by hypoxia and weakness resulting in readmission and discharged to skilled nursing. He is back at home now and reports filling much better. He continues to wear supplemental oxygen for comfort, but is in the process of trying to wean off of it with the assistance of cardiac rehab. He denies chest pain, palpitations, lightheadedness, and orthopnea. He feels as though his feet are a little swollen but he has not noticed any ankle edema. He reports intermittent pain along the left posterior shoulder, that he describes as a muscle cramp. It sometimes radiates to the proximal left arm. He is compliant with his medication. He has applied heat and Icy-Hot to the left shoulder with some improvement. He currently takes metoprolol tartrate 12.5 mg once a day, half of the days of the week. He otherwise holds it due to low BP (SBP<110 mmHg).  --------------------------------------------------------------------------------------------------  Cardiovascular History & Procedures: Cardiovascular Problems:  Coronary artery disease s/p high-risk NSTEMI and urgent CABG (01/2017)  Risk Factors:  Known coronary artery disease, male gender, and age > 8.  Cath/PCI:  LHC (02/14/17): LMCA with 20% distal stenosis. LAD with 95%  ostial lesion and 80-90% midvessel stenosis. Moderate-caliber ramus with 80% ostial stenosis. Codominant LCx and RCA without significant disease. LVEF 40-45% with mid and apical anterior hypokinesis.  CV Surgery:  CABG (02/14/17, Dr. Servando Snare): LIMA->LAD, and SVG->ramus  EP Procedures and Devices:  None  Non-Invasive Evaluation(s):  TEE (02/14/17, intraoperative): LVEF 35-40% with trace MR.  Recent CV Pertinent Labs: Lab Results  Component Value Date   CHOL 114 02/14/2017   HDL 39 (L) 02/14/2017   LDLCALC 65 02/14/2017   TRIG 51 02/14/2017   CHOLHDL 2.9 02/14/2017   INR 1.62 02/14/2017   BNP 945.8 (H) 02/17/2017   K 4.3 03/01/2017   MG 2.3 02/15/2017   BUN 29 (A) 03/01/2017   CREATININE 0.9 03/01/2017   CREATININE 1.11 02/25/2017    Past medical and surgical history were reviewed and updated in EPIC.  Outpatient Encounter Prescriptions as of 04/05/2017  Medication Sig  . acetaminophen (TYLENOL) 500 MG tablet Take 500 mg by mouth every 6 (six) hours as needed.  [name] aspirin 325 MG EC tablet Take 1 tablet (325 mg total) by mouth daily.  [name] atorvastatin (LIPITOR) 80 MG tablet Take 1 tablet (80 mg total) by mouth daily at 6 PM.  . bisacodyl (DULCOLAX) 5 MG EC tablet Take 2 tablets (10 mg total) by mouth daily as needed for moderate constipation (Give daily if no BM).  . furosemide (LASIX) 20 MG tablet Take 20 mg by mouth daily.  [name] guaiFENesin (MUCINEX) 600 MG 12 hr tablet Take by mouth 2 (two) times daily.  [name] ipratropium-albuterol (DUONEB) 0.5-2.5 (3) MG/3ML SOLN Take 3 mLs by nebulization every 6 (six) hours as needed. When awake   . metoprolol tartrate (LOPRESSOR) 25 MG tablet Take  12.5 mg by mouth 2 (two) times daily. Hold for SBP< 110 or HR <60  . OXYGEN Inhale into the lungs. By nasal cannula and wean as tolerated to keep O2 sats >90%  . potassium chloride (K-DUR) 10 MEQ tablet Take 10 mEq by mouth daily.  . tamsulosin (FLOMAX) 0.4 MG CAPS capsule Take 1 capsule (0.4 mg total)  by mouth daily.   No facility-administered encounter medications on file as of 04/05/2017.     Allergies: No known allergies  Social History   Social History  . Marital status: Divorced    Spouse name: N/A  . Number of children: N/A  . Years of education: N/A   Occupational History  . Not on file.   Social History Main Topics  . Smoking status: Former Research scientist (life sciences)  . Smokeless tobacco: Never Used  . Alcohol use No  . Drug use: No  . Sexual activity: Not on file   Other Topics Concern  . Not on file   Social History Narrative  . No narrative on file    Family History  Problem Relation Age of Onset  . Obesity Son     Review of Systems: A 12-system review of systems was performed and was negative except as noted in the HPI.  --------------------------------------------------------------------------------------------------  Physical Exam: BP 100/60 (BP Location: Left Arm, Patient Position: Sitting, Cuff Size: Normal)   Pulse 90   Ht 5\' 4"  (1.626 m)   Wt 150 lb 12 oz (68.4 kg)   BMI 25.88 kg/m   General:  Well-developed, well-nourished man, seated comfortably on the exam table. He is accompanied by his son. HEENT: No conjunctival pallor or scleral icterus.  Moist mucous membranes.  OP clear. Neck: Supple without lymphadenopathy, thyromegaly, JVD, or HJR.  No carotid bruit. Lungs: Normal work of breathing.  Clear to auscultation bilaterally without wheezes or crackles. Heart: Regular rate and rhythm without murmurs, rubs, or gallops.  Non-displaced PMI. Abd: Bowel sounds present.  Soft, NT/ND without hepatosplenomegaly Ext: No lower extremity edema.  Radial, PT, and DP pulses are 2+ bilaterally. Skin: warm and dry without rash. Medial sternotomy scar is well-healed. MSK: Mild tenderness above the left scapula. No deformity.  EKG:  Normal sinus rhythm with qR complex in V1 and V2 and anterior T-wave inversions. T-wave inversions are more pronounced on today's tracing  compared with 02/25/17.  Lab Results  Component Value Date   WBC 8.6 03/01/2017   HGB 9.5 (A) 03/01/2017   HCT 30 (A) 03/01/2017   MCV 93.2 02/25/2017   PLT 407 (A) 03/01/2017    Lab Results  Component Value Date   NA 142 03/01/2017   K 4.3 03/01/2017   CL 94 (L) 02/25/2017   CO2 28 02/25/2017   BUN 29 (A) 03/01/2017   CREATININE 0.9 03/01/2017   GLUCOSE 101 (H) 02/25/2017   ALT 36 03/01/2017    Lab Results  Component Value Date   CHOL 114 02/14/2017   HDL 39 (L) 02/14/2017   LDLCALC 65 02/14/2017   TRIG 51 02/14/2017   CHOLHDL 2.9 02/14/2017    --------------------------------------------------------------------------------------------------  ASSESSMENT AND PLAN: Coronary artery disease without angina Mr. Violett is recovering well from his NSTEMI and urgent CABG in late April. We will continue his current regimen of ASA and metoprolol (as BP allows). Given his left shoulder pain, we will decrease atorvastatin to 40 mg daily in case the pain represents an unusual manifestation of statin-associated myalgias. I encouraged Mr. Pellegrino to continue with cardiac rehab.  Ischemic cardiomyopathy with shortness of breath LVEF was noted to be moderately reduced by LV gram and intraoperative TEE at the time of his NSTEMI. He appears euvolemic with NYHA class II-III heart failure symptoms. Given his continued shortness of breath and low blood pressure, we have agreed to perform a transthoracic echo to reevaluate his LV function and to look for other structural abnormalities that could be leading to dyspnea and hypotension. We will continue his current metoprolol regimen. Addition of ACEI/ARB is precluded by hypotension. If echo is unrevealing and he continues to have significant dyspnea and/or hypoxia, he may benefit from pulmonary consultation.  Hyperlipidemia LDL at the time of MI was good at 65. However, given significant CAD, he was placed on high-intensity statin therapy. We have  agreed to decrease atorvastatin to 40 mg daily to see if this helps with his shoulder pain.  Shoulder pain I suspect this musculoskeletal. It could also be referred from bleb excision at the time of CABG in 01/2017. He can continue with topical treatments such as applying heat or Icy-Hot. If it persists, he should speak with his PCP about further evaluation and possible physical therapy.  Follow-up: Return to clinic in 2 months.  [name], MD 04/05/2017 8:59 AM

## 2017-04-05 NOTE — Patient Instructions (Signed)
Medication Instructions:  Your physician has recommended you make the following change in your medication:  1- DECREASE Lipitor to 40 mg by mouth once a day.   Labwork: Your physician recommends that you return for lab work in: TODAY (CBC, CMP).   Testing/Procedures: Your physician has requested that you have an echocardiogram. Echocardiography is a painless test that uses sound waves to create images of your heart. It provides your doctor with information about the size and shape of your heart and how well your heart's chambers and valves are working. This procedure takes approximately one hour. There are no restrictions for this procedure.    Follow-Up: Your physician recommends that you schedule a follow-up appointment in: 2 MONTHS WITH DR END.   If you need a refill on your cardiac medications before your next appointment, please call your pharmacy.   Echocardiogram An echocardiogram, or echocardiography, uses sound waves (ultrasound) to produce an image of your heart. The echocardiogram is simple, painless, obtained within a short period of time, and offers valuable information to your health care provider. The images from an echocardiogram can provide information such as:  Evidence of coronary artery disease (CAD).  Heart size.  Heart muscle function.  Heart valve function.  Aneurysm detection.  Evidence of a past heart attack.  Fluid buildup around the heart.  Heart muscle thickening.  Assess heart valve function.  Tell a health care provider about:  Any allergies you have.  All medicines you are taking, including vitamins, herbs, eye drops, creams, and over-the-counter medicines.  Any problems you or family members have had with anesthetic medicines.  Any blood disorders you have.  Any surgeries you have had.  Any medical conditions you have.  Whether you are pregnant or may be pregnant. What happens before the procedure? No special preparation is  needed. Eat and drink normally. What happens during the procedure?  In order to produce an image of your heart, gel will be applied to your chest and a wand-like tool (transducer) will be moved over your chest. The gel will help transmit the sound waves from the transducer. The sound waves will harmlessly bounce off your heart to allow the heart images to be captured in real-time motion. These images will then be recorded.  You may need an IV to receive a medicine that improves the quality of the pictures. What happens after the procedure? You may return to your normal schedule including diet, activities, and medicines, unless your health care provider tells you otherwise. This information is not intended to replace advice given to you by your health care provider. Make sure you discuss any questions you have with your health care provider. Document Released: 10/07/2000 Document Revised: 05/28/2016 Document Reviewed: 06/17/2013 Elsevier Interactive Patient Education  2017 [employer].

## 2017-04-05 NOTE — Progress Notes (Signed)
Daily Session Note  Patient Details  Name: Alontae Chaloux MRN: [id number] Date of Birth: 11-May-1946 Referring Provider:     Cardiac Rehab from 03/30/2017 in [facility] Cardiac and Pulmonary Rehab  Referring Provider  End      Encounter Date: 04/05/2017  Check In:     Session Check In - 04/05/17 0816      Check-In   Location [facility]-Cardiac & Pulmonary Rehab   Staff Present Heath Lark, RN, BSN, CCRP;Jessica [place], MA, ACSM RCEP, Exercise Physiologist;Atilla Zollner Oletta Darter, [place], ACSM CEP, Exercise Physiologist   Supervising physician immediately available to respond to emergencies See telemetry face sheet for immediately available ER MD   Medication changes reported     No   Fall or balance concerns reported    No   Warm-up and Cool-down Performed on first and last piece of equipment   Resistance Training Performed Yes   VAD Patient? No     Pain Assessment   Currently in Pain? No/denies         History  Smoking Status  . Former Smoker  . Quit date: 04/06/2007  Smokeless Tobacco  . Never Used    Goals Met:  Independence with exercise equipment Exercise tolerated well No report of cardiac concerns or symptoms  Goals Unmet:  Not Applicable  Comments: Pt able to follow exercise prescription today without complaint.  Will continue to monitor for progression.    Dr. Emily Filbert is Medical Director for [facility] and [facility].

## 2017-04-06 LAB — CBC WITH DIFFERENTIAL/PLATELET
BASOS ABS: 0 10*3/uL (ref 0.0–0.2)
Basos: 0 %
EOS (ABSOLUTE): 0.4 10*3/uL (ref 0.0–0.4)
Eos: 7 %
Hematocrit: 34.9 % — ABNORMAL LOW (ref 37.5–51.0)
Hemoglobin: 11.5 g/dL — ABNORMAL LOW (ref 13.0–17.7)
IMMATURE GRANS (ABS): 0 10*3/uL (ref 0.0–0.1)
Immature Granulocytes: 0 %
LYMPHS: 19 %
Lymphocytes Absolute: 1.1 10*3/uL (ref 0.7–3.1)
MCH: 29.9 pg (ref 26.6–33.0)
MCHC: 33 g/dL (ref 31.5–35.7)
MCV: 91 fL (ref 79–97)
Monocytes Absolute: 0.5 10*3/uL (ref 0.1–0.9)
Monocytes: 8 %
NEUTROS ABS: 3.7 10*3/uL (ref 1.4–7.0)
Neutrophils: 66 %
PLATELETS: 280 10*3/uL (ref 150–379)
RBC: 3.85 x10E6/uL — ABNORMAL LOW (ref 4.14–5.80)
RDW: 15.1 % (ref 12.3–15.4)
WBC: 5.7 10*3/uL (ref 3.4–10.8)

## 2017-04-06 LAB — COMPREHENSIVE METABOLIC PANEL
ALK PHOS: 121 IU/L — AB (ref 39–117)
ALT: 19 IU/L (ref 0–44)
AST: 18 IU/L (ref 0–40)
Albumin/Globulin Ratio: 1.3 (ref 1.2–2.2)
Albumin: 3.9 g/dL (ref 3.5–4.8)
BILIRUBIN TOTAL: 0.2 mg/dL (ref 0.0–1.2)
BUN/Creatinine Ratio: 23 (ref 10–24)
BUN: 23 mg/dL (ref 8–27)
CHLORIDE: 102 mmol/L (ref 96–106)
CO2: 25 mmol/L (ref 20–29)
Calcium: 9.3 mg/dL (ref 8.6–10.2)
Creatinine, Ser: 1.02 mg/dL (ref 0.76–1.27)
GFR calc Af Amer: 86 mL/min/{1.73_m2} (ref >59)
GFR calc non Af Amer: 74 mL/min/{1.73_m2} (ref >59)
GLUCOSE: 114 mg/dL — AB (ref 65–99)
Globulin, Total: 3.1 g/dL (ref 1.5–4.5)
Potassium: 4.1 mmol/L (ref 3.5–5.2)
Sodium: 141 mmol/L (ref 134–144)
TOTAL PROTEIN: 7 g/dL (ref 6.0–8.5)

## 2017-04-07 ENCOUNTER — Encounter: Payer: Medicare Other | Admitting: *Deleted

## 2017-04-07 DIAGNOSIS — Z951 Presence of aortocoronary bypass graft: Secondary | ICD-10-CM

## 2017-04-07 DIAGNOSIS — I214 Non-ST elevation (NSTEMI) myocardial infarction: Secondary | ICD-10-CM

## 2017-04-07 NOTE — Progress Notes (Signed)
Daily Session Note  Patient Details  Name: Nour Scalise MRN: [id number] Date of Birth: 10/16/1946 Referring Provider:     Cardiac Rehab from 03/30/2017 in [facility] Cardiac and Pulmonary Rehab  Referring Provider  End      Encounter Date: 04/07/2017  Check In:     Session Check In - 04/07/17 0855      Check-In   Location [facility]-Cardiac & Pulmonary Rehab   Staff Present Gerlene Burdock, RN, BSN;Susanne Bice, RN, BSN, Lance Sell, BA, ACSM CEP, Exercise Physiologist;Diane Joya Gaskins RN,BSN;Joseph [employer] physician immediately available to respond to emergencies See telemetry face sheet for immediately available ER MD   Medication changes reported     No   Fall or balance concerns reported    No   Tobacco Cessation No Change   Warm-up and Cool-down Performed on first and last piece of equipment   Resistance Training Performed No  Did not stay today since he just needed to leave   VAD Patient? No     Pain Assessment   Currently in Pain? No/denies         History  Smoking Status  . Former Smoker  . Quit date: 04/06/2007  Smokeless Tobacco  . Never Used    Goals Met:  Proper associated with RPD/PD & O2 Sat Exercise tolerated well No report of cardiac concerns or symptoms Strength training completed today  Goals Unmet:  Not Applicable  Comments:     Dr. Emily Filbert is Medical Director for [facility] and [facility].

## 2017-04-10 ENCOUNTER — Encounter: Payer: Medicare Other | Admitting: *Deleted

## 2017-04-10 ENCOUNTER — Telehealth: Payer: Self-pay | Admitting: Internal Medicine

## 2017-04-10 DIAGNOSIS — I214 Non-ST elevation (NSTEMI) myocardial infarction: Secondary | ICD-10-CM

## 2017-04-10 DIAGNOSIS — Z951 Presence of aortocoronary bypass graft: Secondary | ICD-10-CM

## 2017-04-10 NOTE — Telephone Encounter (Signed)
Returned call to patient. Patient has had some increased swelling in ankle/feet. According to his weight at cardiac rehab there has been no significant change as he is usually around 150-152lbs. He denies any change in his SOB status as he is on oxygen 2L r/t COPD. He states his blood pressure is stable; however, he did not have his readings right with him and could only report systolic BP today was 694. Advised patient to take an extra furosemide 20 mg today and that I will route to Dr End for advice. Patient verbalized understanding. Next appt with Dr End is 06/07/17.

## 2017-04-10 NOTE — Telephone Encounter (Signed)
Pt c/o swelling: STAT is pt has developed SOB within 24 hours  1. How long have you been experiencing swelling? 3 days  2. Where is the swelling located? bilateral feet and ankles, near sock line, his shoes are tight  3.  Are you currently taking a "fluid pill"? yes  4.  Are you currently SOB? no  5.  Have you traveled recently? No  He asks if his medications may need adjusted.

## 2017-04-10 NOTE — Progress Notes (Signed)
Daily Session Note  Patient Details  Name: Thomas Trujillo MRN: [id number] Date of Birth: May 23, 1946 Referring Provider:     Cardiac Rehab from 03/30/2017 in [facility] Cardiac and Pulmonary Rehab  Referring Provider  End      Encounter Date: 04/10/2017  Check In:     Session Check In - 04/10/17 0806      Check-In   Location [facility]-Cardiac & Pulmonary Rehab   Staff Present Earlean Shawl, BS, ACSM CEP, Exercise Physiologist;Jessica Luan Pulling, MA, ACSM RCEP, Exercise Physiologist;Carroll Enterkin, RN, BSN   Supervising physician immediately available to respond to emergencies See telemetry face sheet for immediately available ER MD   Medication changes reported     No   Fall or balance concerns reported    No   Warm-up and Cool-down Performed on first and last piece of equipment   Resistance Training Performed No   VAD Patient? No     Pain Assessment   Currently in Pain? No/denies   Multiple Pain Sites No         History  Smoking Status  . Former Smoker  . Quit date: 04/06/2007  Smokeless Tobacco  . Never Used    Goals Met:  Independence with exercise equipment Exercise tolerated well No report of cardiac concerns or symptoms Strength training completed today  Goals Unmet:  Not Applicable  Comments: Pt able to follow exercise prescription today without complaint.  Will continue to monitor for progression.    Dr. Emily Filbert is Medical Director for [facility] and [facility].

## 2017-04-10 NOTE — Telephone Encounter (Signed)
I agree with taking extra furosemide today and monitor his weight. I suspect that his underlying lung disease is contributing to his dyspnea as well. If it persists, he should speak with his PCP. Also, can we try to expedite his echo; currently it is scheduled for late July. Thanks.  [name], MD [facility] Pager: [phone]

## 2017-04-11 NOTE — Telephone Encounter (Signed)
Spoke with patient. He states the swelling in his ankles and feet have improved since yesterday and that he urinated more than usual yesterday. Patient verbalized understanding on taking daily weights at home and to contact PCP for persisting SOB. Patient will continue to monitor and call us back if swelling does not improve. Patient transferred to scheduler to r/s echo to an earlier date.

## 2017-04-12 ENCOUNTER — Encounter: Payer: Medicare Other | Admitting: *Deleted

## 2017-04-12 ENCOUNTER — Encounter: Payer: Self-pay | Admitting: *Deleted

## 2017-04-12 DIAGNOSIS — I214 Non-ST elevation (NSTEMI) myocardial infarction: Secondary | ICD-10-CM

## 2017-04-12 DIAGNOSIS — Z951 Presence of aortocoronary bypass graft: Secondary | ICD-10-CM

## 2017-04-12 NOTE — Progress Notes (Signed)
Cardiac Individual Treatment Plan  Patient Details  Name: Thomas Trujillo MRN: [id number] Date of Birth: January 23, 1946 Referring Provider:     Cardiac Rehab from 03/30/2017 in [facility] Cardiac and Pulmonary Rehab  Referring Provider  End      Initial Encounter Date:    Cardiac Rehab from 03/30/2017 in [facility] Cardiac and Pulmonary Rehab  Date  03/30/17  Referring Provider  End      Visit Diagnosis: S/P CABG x 2  NSTEMI (non-ST elevated myocardial infarction) [facility])  Patient's Home Medications on Admission:  Current Outpatient Prescriptions:  .  acetaminophen (TYLENOL) 500 MG tablet, Take 500 mg by mouth every 6 (six) hours as needed., Disp: , Rfl:  .  aspirin 325 MG EC tablet, Take 1 tablet (325 mg total) by mouth daily., Disp: 30 tablet, Rfl: 0 .  atorvastatin (LIPITOR) 40 MG tablet, Take 1 tablet (40 mg total) by mouth daily., Disp: 90 tablet, Rfl: 3 .  bisacodyl (DULCOLAX) 5 MG EC tablet, Take 2 tablets (10 mg total) by mouth daily as needed for moderate constipation (Give daily if no BM)., Disp: 30 tablet, Rfl: 0 .  ferrous sulfate 325 (65 FE) MG tablet, Take 325 mg by mouth daily with breakfast., Disp: , Rfl:  .  furosemide (LASIX) 20 MG tablet, Take 20 mg by mouth daily., Disp: , Rfl:  .  ipratropium-albuterol (DUONEB) 0.5-2.5 (3) MG/3ML SOLN, Take 3 mLs by nebulization every 6 (six) hours as needed. When awake , Disp: , Rfl:  .  metoprolol tartrate (LOPRESSOR) 25 MG tablet, Take 12.5 mg by mouth 2 (two) times daily. Hold for SBP< 110 or HR <60, Disp: , Rfl:  .  OXYGEN, Inhale into the lungs. By nasal cannula and wean as tolerated to keep O2 sats >90%, Disp: , Rfl:  .  potassium chloride (K-DUR) 10 MEQ tablet, Take 10 mEq by mouth daily., Disp: , Rfl:  .  tamsulosin (FLOMAX) 0.4 MG CAPS capsule, Take 1 capsule (0.4 mg total) by mouth daily., Disp: 30 capsule, Rfl: 1  Past Medical History: Past Medical History:  Diagnosis Date  . Bleb, lung ([place])   . Coronary artery disease 02/14/2017    NSTEMI with urgent CABG (LIMA-LAD and SVG->ramus)  . Hyperlipidemia   . Ischemic cardiomyopathy   . Patient denies medical problems     Tobacco Use: History  Smoking Status  . Former Smoker  . Quit date: 04/06/2007  Smokeless Tobacco  . Never Used    Labs: Recent Review Flowsheet Data    Labs for ITP Cardiac and Pulmonary Rehab Latest Ref Rng & Units 02/15/2017 02/15/2017 02/15/2017 02/18/2017 02/18/2017   Cholestrol 0 - 200 mg/dL - - - - -   LDLCALC 0 - 99 mg/dL - - - - -   HDL >40 mg/dL - - - - -   Trlycerides <150 mg/dL - - - - -   Hemoglobin A1c 4.8 - 5.6 % - - - - -   PHART 7.350 - 7.450 7.316(L) 7.344(L) - 7.393 -   PCO2ART 32.0 - 48.0 mmHg 46.8 42.7 - 50.1(H) -   HCO3 20.0 - 28.0 mmol/L 23.7 23.3 - 30.5(H) -   TCO2 0 - 100 mmol/L 25 25 30  32 30   ACIDBASEDEF 0.0 - 2.0 mmol/L 2.0 2.0 - - -   O2SAT % 96.0 96.0 - 98.0 -       Exercise Target Goals:    Exercise Program Goal: Individual exercise prescription set with THRR, safety & activity barriers. Participant  demonstrates ability to understand and report RPE using BORG scale, to self-measure pulse accurately, and to acknowledge the importance of the exercise prescription.  Exercise Prescription Goal: Starting with aerobic activity 30 plus minutes a day, 3 days per week for initial exercise prescription. Provide home exercise prescription and guidelines that participant acknowledges understanding prior to discharge.  Activity Barriers & Risk Stratification:   6 Minute Walk:     6 Minute Walk    Row Name 03/30/17 1528         6 Minute Walk   Distance 810 feet     Walk Time 4.5 minutes     # of Rest Breaks 2     MPH 2.04     METS 2.6     RPE 17     Perceived Dyspnea  3     VO2 Peak 9.11     Symptoms No     Resting HR 106 bpm     Resting BP 102/56     Max Ex. HR 126 bpm     Max Ex. BP 162/60     2 Minute Post BP 112/58       Interval Oxygen   Interval Oxygen? Yes     Baseline Oxygen Saturation % 97 %      Baseline Liters of Oxygen 2 L     1 Minute Oxygen Saturation % 96 %     1 Minute Liters of Oxygen 2 L     2 Minute Oxygen Saturation % 94 %     2 Minute Liters of Oxygen 2 L     3 Minute Liters of Oxygen 2 L     4 Minute Oxygen Saturation % 93 %     4 Minute Liters of Oxygen 2 L     5 Minute Liters of Oxygen 2 L     6 Minute Oxygen Saturation % 94 %     6 Minute Liters of Oxygen 2 L     2 Minute Post Liters of Oxygen 2 L        Oxygen Initial Assessment:     Oxygen Initial Assessment - 03/30/17 1540      Initial 6 min Walk   Resting Oxygen Saturation  during 6 min walk 96 %   Exercise Oxygen Saturation  during 6 min walk 94 %     Program Oxygen Prescription   Program Oxygen Prescription Continuous   Liters per minute 2   Comments Karston has been told he does not need oxygen.  Sat dropped to 85% when he started walk test.  PLaced oxygen on at 2l     Intervention   Short Term Goals To learn and exhibit compliance with exercise, home and travel O2 prescription;To learn and understand importance of monitoring SPO2 with pulse oximeter and demonstrate accurate use of the pulse oximeter.;To Learn and understand importance of maintaining oxygen saturations>88%   Long  Term Goals Exhibits compliance with exercise, home and travel O2 prescription;Verbalizes importance of monitoring SPO2 with pulse oximeter and return demonstration;Maintenance of O2 saturations>88%;Exhibits proper breathing techniques, such as purse lipped breathing or other method taught during program session      Oxygen Re-Evaluation:     Oxygen Re-Evaluation    Row Name 04/07/17 0856             Program Oxygen Prescription   Program Oxygen Prescription Continuous       Liters per minute 2  Home Oxygen   Home Oxygen Device Home Concentrator;E-Tanks       Sleep Oxygen Prescription None       Home Exercise Oxygen Prescription Continuous       Liters per minute 2       Home at Rest Exercise  Oxygen Prescription None       Compliance with Home Oxygen Use Yes         Goals/Expected Outcomes   Comments Zyrell used our oxgyen tank on 2liters/minute during Cardiac Rehab then switched to his home oxgyen when he left to go home.           Oxygen Discharge (Final Oxygen Re-Evaluation):     Oxygen Re-Evaluation - 04/07/17 0856      Program Oxygen Prescription   Program Oxygen Prescription Continuous   Liters per minute 2     Home Oxygen   Home Oxygen Device Home Concentrator;E-Tanks   Sleep Oxygen Prescription None   Home Exercise Oxygen Prescription Continuous   Liters per minute 2   Home at Rest Exercise Oxygen Prescription None   Compliance with Home Oxygen Use Yes     Goals/Expected Outcomes   Comments Zubin used our oxgyen tank on 2liters/minute during Cardiac Rehab then switched to his home oxgyen when he left to go home.       Initial Exercise Prescription:     Initial Exercise Prescription - 03/30/17 1500      Date of Initial Exercise RX and Referring Provider   Date 03/30/17   Referring Provider End     Treadmill   MPH 2   Grade 0   Minutes 15   METs 2.53     Recumbant Bike   Level 1   RPM 50   Watts 13   Minutes 15   METs 2.5     NuStep   Level 2   SPM 80   Minutes 15   METs 2.5     Biostep-RELP   Level 2   SPM 50   Minutes 15   METs 2     Prescription Details   Frequency (times per week) 3   Duration Progress to 30 minutes of continuous aerobic without signs/symptoms of physical distress     Intensity   THRR 40-80% of Max Heartrate 123-141   Ratings of Perceived Exertion 11-13   Perceived Dyspnea 0-4     Resistance Training   Training Prescription Yes   Weight 3   Reps 10-15      Perform Capillary Blood Glucose checks as needed.  Exercise Prescription Changes:     Exercise Prescription Changes    Row Name 03/30/17 1500 04/05/17 1600           Response to Exercise   Blood Pressure (Admit) 102/56 122/50      Blood  Pressure (Exercise) 162/60 116/50      Blood Pressure (Exit) 112/58 118/64      Heart Rate (Admit) 109 bpm 102 bpm      Heart Rate (Exercise) 129 bpm 116 bpm      Heart Rate (Exit) 111 bpm 102 bpm      Oxygen Saturation (Admit) 96 % 97 %      Oxygen Saturation (Exercise) 94 %  -      Rating of Perceived Exertion (Exercise)  - 12      Symptoms  - none      Duration  - Progress to 45 minutes of aerobic exercise without signs/symptoms of  physical distress      Intensity  - THRR unchanged        Progression   Progression  - Continue to progress workloads to maintain intensity without signs/symptoms of physical distress.      Average METs  - 2.5        Resistance Training   Training Prescription  - Yes      Weight  - 3 lbs      Reps  - 10-15        Interval Training   Interval Training  - No        Recumbant Bike   Level  - 1      Watts  - 13      Minutes  - 15      METs  - 2.59        NuStep   Level  - 2      Minutes  - 15      METs  - 2.5         Exercise Comments:     Exercise Comments    Row Name 04/03/17 0912 04/05/17 0956         Exercise Comments First full day of exercise!  Patient was oriented to gym and equipment including functions, settings, policies, and procedures.  Patient's individual exercise prescription and treatment plan were reviewed.  All starting workloads were established based on the results of the 6 minute walk test done at initial orientation visit.  The plan for exercise progression was also introduced and progression will be customized based on patient's performance and goals Aariz completed an abbreviated exercise session due to  Dr appointment.         Exercise Goals and Review:     Exercise Goals    Row Name 03/30/17 1528             Exercise Goals   Increase Physical Activity Yes       Intervention Provide advice, education, support and counseling about physical activity/exercise needs.;Develop an individualized exercise  prescription for aerobic and resistive training based on initial evaluation findings, risk stratification, comorbidities and participant's personal goals.       Expected Outcomes Achievement of increased cardiorespiratory fitness and enhanced flexibility, muscular endurance and strength shown through measurements of functional capacity and personal statement of participant.       Increase Strength and Stamina Yes       Intervention Provide advice, education, support and counseling about physical activity/exercise needs.;Develop an individualized exercise prescription for aerobic and resistive training based on initial evaluation findings, risk stratification, comorbidities and participant's personal goals.       Expected Outcomes Achievement of increased cardiorespiratory fitness and enhanced flexibility, muscular endurance and strength shown through measurements of functional capacity and personal statement of participant.          Exercise Goals Re-Evaluation :     Exercise Goals Re-Evaluation    Row Name 04/05/17 1600             Exercise Goal Re-Evaluation   Exercise Goals Review Increase Physical Activity;Increase Strenth and Stamina       Comments Jobany has completed two exercise days with rehab so far.  He is off to good a start.  We will continue to monitor his progression.       Expected Outcomes Continue to come to classes to work on strength and stamina.          Discharge Exercise Prescription (  Final Exercise Prescription Changes):     Exercise Prescription Changes - 04/05/17 1600      Response to Exercise   Blood Pressure (Admit) 122/50   Blood Pressure (Exercise) 116/50   Blood Pressure (Exit) 118/64   Heart Rate (Admit) 102 bpm   Heart Rate (Exercise) 116 bpm   Heart Rate (Exit) 102 bpm   Oxygen Saturation (Admit) 97 %   Rating of Perceived Exertion (Exercise) 12   Symptoms none   Duration Progress to 45 minutes of aerobic exercise without signs/symptoms of  physical distress   Intensity THRR unchanged     Progression   Progression Continue to progress workloads to maintain intensity without signs/symptoms of physical distress.   Average METs 2.5     Resistance Training   Training Prescription Yes   Weight 3 lbs   Reps 10-15     Interval Training   Interval Training No     Recumbant Bike   Level 1   Watts 13   Minutes 15   METs 2.59     NuStep   Level 2   Minutes 15   METs 2.5      Nutrition:  Target Goals: Understanding of nutrition guidelines, daily intake of sodium 1500mg , cholesterol 200mg , calories 30% from fat and 7% or less from saturated fats, daily to have 5 or more servings of fruits and vegetables.  Biometrics:     Pre Biometrics - 03/30/17 1527      Pre Biometrics   Height 5\' 4"  (1.626 m)   Weight 152 lb 1.6 oz (69 kg)   Waist Circumference 37.25 inches   Hip Circumference 39.5 inches   Waist to Hip Ratio 0.94 %   BMI (Calculated) 26.2   Single Leg Stand 30 seconds       Nutrition Therapy Plan and Nutrition Goals:     Nutrition Therapy & Goals - 03/30/17 1545      Intervention Plan   Intervention Prescribe, educate and counsel regarding individualized specific dietary modifications aiming towards targeted core components such as weight, hypertension, lipid management, diabetes, heart failure and other comorbidities.   Expected Outcomes Short Term Goal: Understand basic principles of dietary content, such as calories, fat, sodium, cholesterol and nutrients.;Short Term Goal: A plan has been developed with personal nutrition goals set during dietitian appointment.;Long Term Goal: Adherence to prescribed nutrition plan.      Nutrition Discharge: Rate Your Plate Scores:     Nutrition Assessments - 03/30/17 1545      MEDFICTS Scores   Pre Score 6      Nutrition Goals Re-Evaluation:   Nutrition Goals Discharge (Final Nutrition Goals Re-Evaluation):   Psychosocial: Target Goals:  Acknowledge presence or absence of significant depression and/or stress, maximize coping skills, provide positive support system. Participant is able to verbalize types and ability to use techniques and skills needed for reducing stress and depression.   Initial Review & Psychosocial Screening:     Initial Psych Review & Screening - 03/30/17 1547      Initial Review   Current issues with Current Sleep Concerns  Sleeping in recliner so he can breath easier. Was not sleeping well prior to surgery. [place] in first floor apartment and noise from second floor would wake him up and messed his schedule up for being up and sleep.  Since came home has oxygen concentrator     Family Dynamics   Good Support System? Yes  son, son's church   Comments Sleeping better since oxygen  concentrator in use.  Blocks out the noise from the apartment above him.      Barriers   Psychosocial barriers to participate in program There are no identifiable barriers or psychosocial needs.;The patient should benefit from training in stress management and relaxation.     Screening Interventions   Interventions Encouraged to exercise;To provide support and resources with identified psychosocial needs;Provide feedback about the scores to participant;Program counselor consult      Quality of Life Scores:      Quality of Life - 03/30/17 1549      Quality of Life Scores   Health/Function Pre 23.46 %   Socioeconomic Pre 29 %   Psych/Spiritual Pre 30 %   Family Pre 28.5 %   GLOBAL Pre 26.58 %      PHQ-9: Recent Review Flowsheet Data    Depression screen [facility] 2/9 03/30/2017   Decreased Interest 2   Down, Depressed, Hopeless 0   PHQ - 2 Score 2   Altered sleeping 3    Tired, decreased energy 3    Change in appetite 1    Feeling bad or failure about yourself  0   Trouble concentrating 0   Moving slowly or fidgety/restless 0   Suicidal thoughts 0   PHQ-9 Score 9   Difficult doing work/chores Not difficult at all      Interpretation of Total Score  Total Score Depression Severity:  1-4 = Minimal depression, 5-9 = Mild depression, 10-14 = Moderate depression, 15-19 = Moderately severe depression, 20-27 = Severe depression   Psychosocial Evaluation and Intervention:   Psychosocial Re-Evaluation:   Psychosocial Discharge (Final Psychosocial Re-Evaluation):   Vocational Rehabilitation: Provide vocational rehab assistance to qualifying candidates.   Vocational Rehab Evaluation & Intervention:     Vocational Rehab - 03/30/17 1552      Initial Vocational Rehab Evaluation & Intervention   Assessment shows need for Vocational Rehabilitation No      Education: Education Goals: Education classes will be provided on a weekly basis, covering required topics. Participant will state understanding/return demonstration of topics presented.  Learning Barriers/Preferences:     Learning Barriers/Preferences - 03/30/17 1552      Learning Barriers/Preferences   Learning Barriers None   Learning Preferences Skilled Demonstration;Pictoral;Individual Instruction;Written Material;Video      Education Topics: General Nutrition Guidelines/Fats and Fiber: -Group instruction provided by verbal, written material, models and posters to present the general guidelines for heart healthy nutrition. Gives an explanation and review of dietary fats and fiber.   Controlling Sodium/Reading Food Labels: -Group verbal and written material supporting the discussion of sodium use in heart healthy nutrition. Review and explanation with models, verbal and written materials for utilization of the food label.   Exercise Physiology & Risk Factors: - Group verbal and written instruction with models to review the exercise physiology of the cardiovascular system and associated critical values. Details cardiovascular disease risk factors and the goals associated with each risk factor.   Aerobic Exercise & Resistance  Training: - Gives group verbal and written discussion on the health impact of inactivity. On the components of aerobic and resistive training programs and the benefits of this training and how to safely progress through these programs.   Flexibility, Balance, General Exercise Guidelines: - Provides group verbal and written instruction on the benefits of flexibility and balance training programs. Provides general exercise guidelines with specific guidelines to those with heart or lung disease. Demonstration and skill practice provided.   Stress Management: - Provides group  verbal and written instruction about the health risks of elevated stress, cause of high stress, and healthy ways to reduce stress.   Depression: - Provides group verbal and written instruction on the correlation between heart/lung disease and depressed mood, treatment options, and the stigmas associated with seeking treatment.   Anatomy & Physiology of the Heart: - Group verbal and written instruction and models provide basic cardiac anatomy and physiology, with the coronary electrical and arterial systems. Review of: AMI, Angina, Valve disease, Heart Failure, Cardiac Arrhythmia, Pacemakers, and the ICD.   Cardiac Procedures: - Group verbal and written instruction and models to describe the testing methods done to diagnose heart disease. Reviews the outcomes of the test results. Describes the treatment choices: Medical Management, Angioplasty, or Coronary Bypass Surgery.   Cardiac Medications: - Group verbal and written instruction to review commonly prescribed medications for heart disease. Reviews the medication, class of the drug, and side effects. Includes the steps to properly store meds and maintain the prescription regimen.   Cardiac Rehab from 04/10/2017 in [facility] Cardiac and Pulmonary Rehab  Date  04/03/17  Educator  CE  Instruction Review Code  2- meets goals/outcomes      Go Sex-Intimacy & Heart Disease, Get  SMART - Goal Setting: - Group verbal and written instruction through game format to discuss heart disease and the return to sexual intimacy. Provides group verbal and written material to discuss and apply goal setting through the application of the S.M.A.R.T. Method.   Other Matters of the Heart: - Provides group verbal, written materials and models to describe Heart Failure, Angina, Valve Disease, and Diabetes in the realm of heart disease. Includes description of the disease process and treatment options available to the cardiac patient.   Exercise & Equipment Safety: - Individual verbal instruction and demonstration of equipment use and safety with use of the equipment.   Cardiac Rehab from 04/10/2017 in [facility] Cardiac and Pulmonary Rehab  Date  03/30/17  Educator  SB  Instruction Review Code  2- meets goals/outcomes      Infection Prevention: - Provides verbal and written material to individual with discussion of infection control including proper hand washing and proper equipment cleaning during exercise session.   Cardiac Rehab from 04/10/2017 in [facility] Cardiac and Pulmonary Rehab  Date  03/30/17  Educator  Sb  Instruction Review Code  2- meets goals/outcomes      Falls Prevention: - Provides verbal and written material to individual with discussion of falls prevention and safety.   Cardiac Rehab from 04/10/2017 in [facility] Cardiac and Pulmonary Rehab  Date  03/30/17  Educator  SB  Instruction Review Code  2- meets goals/outcomes      Diabetes: - Individual verbal and written instruction to review signs/symptoms of diabetes, desired ranges of glucose level fasting, after meals and with exercise. Advice that pre and post exercise glucose checks will be done for 3 sessions at entry of program.    Knowledge Questionnaire Score:     Knowledge Questionnaire Score - 03/30/17 1552      Knowledge Questionnaire Score   Pre Score --  Has not completed this yet      Core  Components/Risk Factors/Patient Goals at Admission:     Personal Goals and Risk Factors at Admission - 03/30/17 1546      Core Components/Risk Factors/Patient Goals on Admission   Lipids Yes   Intervention Provide education and support for participant on nutrition & aerobic/resistive exercise along with prescribed medications to achieve LDL <  70mg , HDL >40mg .   Expected Outcomes Short Term: Participant states understanding of desired cholesterol values and is compliant with medications prescribed. Participant is following exercise prescription and nutrition guidelines.;Long Term: Cholesterol controlled with medications as prescribed, with individualized exercise RX and with personalized nutrition plan. Value goals: LDL < 70mg , HDL > 40 mg.      Core Components/Risk Factors/Patient Goals Review:    Core Components/Risk Factors/Patient Goals at Discharge (Final Review):    ITP Comments:     ITP Comments    Row Name 03/30/17 1537 04/07/17 0857 04/12/17 0648       ITP Comments Medical review completed, INitial ITP created   Documentation of Diagnosis can be found in [facility] 5/4 admission Uses oxgyen 2 liters/minute.  30 day review. Continue with ITP unless directed changes per Medical Director review. New to program        Comments:

## 2017-04-12 NOTE — Telephone Encounter (Signed)
S/w patient. He states his swelling has improved and is "half way" down. He did not take extra dose of furosemide until yesterday on 04/11/17. Says he is urinating more than usual. No new symptoms or complaints. Patient has not performed any daily weights. He went to rehab today and says his BP was fine there.  Patient would like to also know if he needs to continue to ferrous sulfate. He thinks he was told to stop it; however, I can not find where that is listed in his chart at this time.  Advised patient to continue to monitor swelling and weights and to let us know if it does not continue to improve. Will route to Dr End for further advice and to advise on ferrous sulfate.

## 2017-04-12 NOTE — Progress Notes (Signed)
Daily Session Note  Patient Details  Name: Thomas Trujillo MRN: [id number] Date of Birth: [birth year] Referring Provider:     Cardiac Rehab from 03/30/2017 in [facility] Cardiac and Pulmonary Rehab  Referring Provider  End      Encounter Date: 04/12/2017  Check In:     Session Check In - 04/12/17 0933      Check-In   Location [facility]-Cardiac & Pulmonary Rehab   Staff Present Heath Lark, RN, BSN, CCRP;Jessica Luan Pulling, MA, ACSM RCEP, Exercise Physiologist;Amanda Oletta Darter, [place], ACSM CEP, Exercise Physiologist   Supervising physician immediately available to respond to emergencies See telemetry face sheet for immediately available ER MD   Medication changes reported     No   Fall or balance concerns reported    No   Warm-up and Cool-down Performed on first and last piece of equipment   Resistance Training Performed Yes   VAD Patient? No     Pain Assessment   Currently in Pain? No/denies   Multiple Pain Sites No         History  Smoking Status  . Former Smoker  . Quit date: 04/06/2007  Smokeless Tobacco  . Never Used    Goals Met:  Independence with exercise equipment Exercise tolerated well No report of cardiac concerns or symptoms Strength training completed today  Goals Unmet:  Not Applicable  Comments: Pt able to follow exercise prescription today without complaint.  Will continue to monitor for progression. Reviewed home exercise with pt today.  Pt plans to walk at home for exercise.  Reviewed THR, pulse, RPE, sign and symptoms, and when to call 911 or MD.  Also discussed weather considerations and indoor options.  Pt voiced understanding.    Dr. Emily Filbert is Medical Director for [place] and [facility].

## 2017-04-12 NOTE — Telephone Encounter (Signed)
Pt calling stating to give [place] an update He took an extra pill yesterday and the swelling he states went down "Half way"  Would like to know if he is to take another one today  Please advise

## 2017-04-13 NOTE — Telephone Encounter (Signed)
S/w patient. He verbalized understanding of Dr Darnelle Bos recommendations and was very appreciative for the plan.

## 2017-04-13 NOTE — Telephone Encounter (Signed)
He can continue taking an extra dose of furosemide until his edema is down to his baseline. Thereafter, he should take furosemide daily with an additional dose if he gains 2 pounds in a day or 5 pounds in a week.

## 2017-04-14 ENCOUNTER — Encounter: Payer: Medicare Other | Admitting: *Deleted

## 2017-04-14 ENCOUNTER — Other Ambulatory Visit: Payer: Self-pay | Admitting: Physician Assistant

## 2017-04-14 DIAGNOSIS — I214 Non-ST elevation (NSTEMI) myocardial infarction: Secondary | ICD-10-CM

## 2017-04-14 DIAGNOSIS — Z951 Presence of aortocoronary bypass graft: Secondary | ICD-10-CM

## 2017-04-14 NOTE — Progress Notes (Signed)
Daily Session Note  Patient Details  Name: Thomas Trujillo MRN: [id number] Date of Birth: September 15, 1946 Referring Provider:     Cardiac Rehab from 03/30/2017 in [facility] Cardiac and Pulmonary Rehab  Referring Provider  End      Encounter Date: 04/14/2017  Check In:     Session Check In - 04/14/17 0947      Check-In   Location [facility]-Cardiac & Pulmonary Rehab   Staff Present Heath Lark, RN, BSN, CCRP;Kameren Pargas Luan Pulling, MA, ACSM RCEP, Exercise Physiologist;Mary Kellie Shropshire, RN, BSN, Lauretta Grill RCP,RRT,BSRT   Supervising physician immediately available to respond to emergencies See telemetry face sheet for immediately available ER MD   Medication changes reported     No   Fall or balance concerns reported    No   Warm-up and Cool-down Performed as group-led instruction   Resistance Training Performed Yes   VAD Patient? No     Pain Assessment   Currently in Pain? No/denies   Multiple Pain Sites No         History  Smoking Status  . Former Smoker  . Quit date: 04/06/2007  Smokeless Tobacco  . Never Used    Goals Met:  Independence with exercise equipment Exercise tolerated well No report of cardiac concerns or symptoms Strength training completed today  Goals Unmet:  Not Applicable  Comments: Pt able to follow exercise prescription today without complaint.  Will continue to monitor for progression.    Dr. Emily Filbert is Medical Director for [place] and [facility].

## 2017-04-17 ENCOUNTER — Encounter: Payer: Self-pay | Admitting: Dietician

## 2017-04-17 ENCOUNTER — Encounter: Payer: Medicare Other | Admitting: *Deleted

## 2017-04-17 DIAGNOSIS — I214 Non-ST elevation (NSTEMI) myocardial infarction: Secondary | ICD-10-CM | POA: Diagnosis not present

## 2017-04-17 DIAGNOSIS — Z951 Presence of aortocoronary bypass graft: Secondary | ICD-10-CM

## 2017-04-17 NOTE — Progress Notes (Signed)
Daily Session Note  Patient Details  Name: Thomas Trujillo MRN: [id number] Date of Birth: 1946-06-28 Referring Provider:     Cardiac Rehab from 03/30/2017 in [facility] Cardiac and Pulmonary Rehab  Referring Provider  End      Encounter Date: 04/17/2017  Check In:     Session Check In - 04/17/17 0845      Check-In   Location [facility]-Cardiac & Pulmonary Rehab   Staff Present Gerlene Burdock, RN, Levie Heritage, MA, ACSM RCEP, Exercise Physiologist;Kelly Amedeo Plenty, BS, ACSM CEP, Exercise Physiologist   Supervising physician immediately available to respond to emergencies See telemetry face sheet for immediately available ER MD   Medication changes reported     No   Fall or balance concerns reported    No   Warm-up and Cool-down Performed on first and last piece of equipment   Resistance Training Performed Yes   VAD Patient? No     Pain Assessment   Currently in Pain? No/denies   Multiple Pain Sites No         History  Smoking Status  . Former Smoker  . Quit date: 04/06/2007  Smokeless Tobacco  . Never Used    Goals Met:  Independence with exercise equipment Exercise tolerated well No report of cardiac concerns or symptoms Strength training completed today  Goals Unmet:  Not Applicable  Comments: Pt able to follow exercise prescription today without complaint.  Will continue to monitor for progression.    Dr. Emily Filbert is Medical Director for [place] and [facility].

## 2017-04-19 ENCOUNTER — Encounter: Payer: Medicare Other | Admitting: *Deleted

## 2017-04-19 DIAGNOSIS — Z951 Presence of aortocoronary bypass graft: Secondary | ICD-10-CM

## 2017-04-19 DIAGNOSIS — I214 Non-ST elevation (NSTEMI) myocardial infarction: Secondary | ICD-10-CM

## 2017-04-19 NOTE — Progress Notes (Signed)
Daily Session Note  Patient Details  Name: Thomas Trujillo MRN: [id number] Date of Birth: 1946/03/30 Referring Provider:     Cardiac Rehab from 03/30/2017 in [facility] Cardiac and Pulmonary Rehab  Referring Provider  End      Encounter Date: 04/19/2017  Check In:     Session Check In - 04/19/17 0911      Check-In   Location [facility]-Cardiac & Pulmonary Rehab   Staff Present Heath Lark, RN, BSN, CCRP;Jessica Luan Pulling, MA, ACSM RCEP, Exercise Physiologist;Krista Frederico Hamman, RN BSN;Diane Joya Gaskins RN,BSN   Supervising physician immediately available to respond to emergencies See telemetry face sheet for immediately available ER MD   Medication changes reported     No   Fall or balance concerns reported    No   Warm-up and Cool-down Performed on first and last piece of equipment   Resistance Training Performed Yes   VAD Patient? No     Pain Assessment   Currently in Pain? No/denies   Multiple Pain Sites No           Exercise Prescription Changes - 04/18/17 1200      Response to Exercise   Blood Pressure (Admit) 108/56   Blood Pressure (Exercise) 138/78   Blood Pressure (Exit) 138/70   Heart Rate (Admit) 90 bpm   Heart Rate (Exercise) 117 bpm   Heart Rate (Exit) 84 bpm   Rating of Perceived Exertion (Exercise) 13   Symptoms none   Duration Continue with 45 min of aerobic exercise without signs/symptoms of physical distress.   Intensity THRR unchanged     Progression   Progression Continue to progress workloads to maintain intensity without signs/symptoms of physical distress.   Average METs 2.55     Resistance Training   Training Prescription Yes   Weight 3 lbs   Reps 10-15     Interval Training   Interval Training No     Treadmill   MPH 2   Grade 0.5   Minutes 15   METs 2.67     Recumbant Bike   Level 4   Watts 16   Minutes 15   METs 2.67     NuStep   Level 2   Minutes 15   METs 2.3     Home Exercise Plan   Plans to continue exercise at Home (comment)   walking   Frequency Add 2 additional days to program exercise sessions.   Initial Home Exercises Provided 04/12/17      History  Smoking Status  . Former Smoker  . Quit date: 04/06/2007  Smokeless Tobacco  . Never Used    Goals Met:  Independence with exercise equipment Exercise tolerated well No report of cardiac concerns or symptoms Strength training completed today  Goals Unmet:  Not Applicable  Comments: Pt able to follow exercise prescription today without complaint.  Will continue to monitor for progression.    Dr. Emily Filbert is Medical Director for [place] and [facility].

## 2017-04-21 ENCOUNTER — Encounter: Payer: Medicare Other | Admitting: *Deleted

## 2017-04-21 ENCOUNTER — Ambulatory Visit (INDEPENDENT_AMBULATORY_CARE_PROVIDER_SITE_OTHER): Payer: Medicare Other

## 2017-04-21 ENCOUNTER — Other Ambulatory Visit: Payer: Self-pay

## 2017-04-21 DIAGNOSIS — I214 Non-ST elevation (NSTEMI) myocardial infarction: Secondary | ICD-10-CM | POA: Diagnosis not present

## 2017-04-21 DIAGNOSIS — R0602 Shortness of breath: Secondary | ICD-10-CM

## 2017-04-21 DIAGNOSIS — I255 Ischemic cardiomyopathy: Secondary | ICD-10-CM | POA: Diagnosis not present

## 2017-04-21 DIAGNOSIS — Z951 Presence of aortocoronary bypass graft: Secondary | ICD-10-CM

## 2017-04-21 NOTE — Progress Notes (Signed)
Daily Session Note  Patient Details  Name: Thomas Trujillo MRN: [id number] Date of Birth: [birth year] Referring Provider:     Cardiac Rehab from 03/30/2017 in [facility] Cardiac and Pulmonary Rehab  Referring Provider  End      Encounter Date: 04/21/2017  Check In:     Session Check In - 04/21/17 0836      Check-In   Location [facility]-Cardiac & Pulmonary Rehab   Staff Present Heath Lark, RN, BSN, CCRP;Jessica Luan Pulling, MA, ACSM RCEP, Exercise Physiologist;Jesstin Studstill, RN, BSN   Supervising physician immediately available to respond to emergencies See telemetry face sheet for immediately available ER MD   Medication changes reported     No   Fall or balance concerns reported    No   Tobacco Cessation No Change   Warm-up and Cool-down Performed on first and last piece of equipment   Resistance Training Performed Yes   VAD Patient? No     Pain Assessment   Currently in Pain? No/denies         History  Smoking Status  . Former Smoker  . Quit date: 04/06/2007  Smokeless Tobacco  . Never Used    Goals Met:  Proper associated with RPD/PD & O2 Sat Changing diet to healthy choices, watching portion sizes No report of cardiac concerns or symptoms Strength training completed today  Goals Unmet:  Not Applicable  Comments:     Dr. Emily Filbert is Medical Director for [employer] and [facility].

## 2017-04-23 ENCOUNTER — Other Ambulatory Visit: Payer: Self-pay | Admitting: Cardiothoracic Surgery

## 2017-04-24 ENCOUNTER — Encounter: Payer: Medicare Other | Attending: Internal Medicine | Admitting: *Deleted

## 2017-04-24 DIAGNOSIS — Z79899 Other long term (current) drug therapy: Secondary | ICD-10-CM | POA: Diagnosis not present

## 2017-04-24 DIAGNOSIS — I214 Non-ST elevation (NSTEMI) myocardial infarction: Secondary | ICD-10-CM | POA: Insufficient documentation

## 2017-04-24 DIAGNOSIS — Z951 Presence of aortocoronary bypass graft: Secondary | ICD-10-CM | POA: Diagnosis not present

## 2017-04-24 NOTE — Progress Notes (Signed)
Daily Session Note  Patient Details  Name: Thomas Trujillo MRN: [id number] Date of Birth: 1946-10-19 Referring Provider:     Cardiac Rehab from 03/30/2017 in [facility] Cardiac and Pulmonary Rehab  Referring Provider  End      Encounter Date: 04/24/2017  Check In:     Session Check In - 04/24/17 0843      Check-In   Location [facility]-Cardiac & Pulmonary Rehab   Staff Present Gerlene Burdock, RN, Levie Heritage, MA, ACSM RCEP, Exercise Physiologist;Kelly Amedeo Plenty, BS, ACSM CEP, Exercise Physiologist   Supervising physician immediately available to respond to emergencies See telemetry face sheet for immediately available ER MD   Medication changes reported     No   Fall or balance concerns reported    No   Warm-up and Cool-down Performed as group-led instruction   Resistance Training Performed Yes   VAD Patient? No     Pain Assessment   Currently in Pain? No/denies   Multiple Pain Sites No         History  Smoking Status  . Former Smoker  . Quit date: 04/06/2007  Smokeless Tobacco  . Never Used    Goals Met:  Independence with exercise equipment Exercise tolerated well No report of cardiac concerns or symptoms Strength training completed today  Goals Unmet:  Not Applicable  Comments: Pt able to follow exercise prescription today without complaint.  Will continue to monitor for progression.    Dr. Emily Filbert is Medical Director for [place] and [facility].

## 2017-04-27 ENCOUNTER — Telehealth: Payer: Self-pay | Admitting: Internal Medicine

## 2017-04-27 DIAGNOSIS — M7989 Other specified soft tissue disorders: Secondary | ICD-10-CM

## 2017-04-27 MED ORDER — FUROSEMIDE 20 MG PO TABS: 20.0000 mg | ORAL_TABLET | ORAL | 3 refills | Status: DC

## 2017-04-27 NOTE — Telephone Encounter (Signed)
Pt is returning your call from 7/2 regarding echo result. Pt also has a question regarding his Lasix. Please discuss.

## 2017-04-27 NOTE — Telephone Encounter (Signed)
Spoke with patients son and reviewed Dr. Darnelle Bos recommendations for venous duplex and labs. He verbalized understanding and was agreeable with plan. Scheduled him to come in tomorrow at 4 pm and also go to [facility] for labs. Reviewed locations for both tests and he confirmed place and time. Instructed him to please call back if he has any further questions.

## 2017-04-27 NOTE — Telephone Encounter (Signed)
Reviewed results and recommendations with patients son. He reports that they just saw Dr. Raul Del with [facility] pulmonary and they have done some testing. He also reports that they are running out of furosemide due to taking extra doses. Will send in refill with updated instructions. Reviewed with him that patient should try some compression hose or socks and to keep feet elevated on 2-3 pillows to help with the swelling. Patients son wanted me to also make Dr. Saunders Revel aware of the pulmonary testing. He verbalized understanding of our conversation, agreement with plan, and had no further questions at this time.

## 2017-04-27 NOTE — Telephone Encounter (Signed)
It is fine to continue with furosemide and additional dose for swelling or weight gain, as discussed at prior visit. If edema remains a significant issue, it may be worthwhile to obtain BLE venous duplex to exclude DVT. We should also have him come in for a repeat BMP to ensure stable renal function and potassium, if he has been taking extra furosemide on a regular basis. I have reviewed consult note from [facility] and agree with the plan. He should f/u with [place] as previously arranged.

## 2017-04-28 ENCOUNTER — Other Ambulatory Visit
Admission: RE | Admit: 2017-04-28 | Discharge: 2017-04-28 | Disposition: A | Discharge status: Home / Self Care | Payer: Medicare Other | Source: Ambulatory Visit | Attending: Internal Medicine | Admitting: Internal Medicine

## 2017-04-28 ENCOUNTER — Other Ambulatory Visit: Payer: Self-pay | Admitting: Internal Medicine

## 2017-04-28 ENCOUNTER — Ambulatory Visit: Payer: Medicare Other

## 2017-04-28 DIAGNOSIS — M7989 Other specified soft tissue disorders: Secondary | ICD-10-CM

## 2017-04-28 DIAGNOSIS — I214 Non-ST elevation (NSTEMI) myocardial infarction: Secondary | ICD-10-CM | POA: Diagnosis not present

## 2017-04-28 DIAGNOSIS — R0600 Dyspnea, unspecified: Secondary | ICD-10-CM

## 2017-04-28 DIAGNOSIS — Z951 Presence of aortocoronary bypass graft: Secondary | ICD-10-CM

## 2017-04-28 LAB — BASIC METABOLIC PANEL
Anion gap: 8 (ref 5–15)
BUN: 24 mg/dL — ABNORMAL HIGH (ref 6–20)
CALCIUM: 9.5 mg/dL (ref 8.9–10.3)
CHLORIDE: 103 mmol/L (ref 101–111)
CO2: 28 mmol/L (ref 22–32)
CREATININE: 1.29 mg/dL — AB (ref 0.61–1.24)
GFR calc non Af Amer: 55 mL/min — ABNORMAL LOW (ref >60)
GLUCOSE: 104 mg/dL — AB (ref 65–99)
Potassium: 3.8 mmol/L (ref 3.5–5.1)
Sodium: 139 mmol/L (ref 135–145)

## 2017-04-28 NOTE — Progress Notes (Signed)
Daily Session Note  Patient Details  Name: Domenic Schoenberger MRN: [id number] Date of Birth: 1946/05/07 Referring Provider:     Cardiac Rehab from 03/30/2017 in [facility] Cardiac and Pulmonary Rehab  Referring Provider  End      Encounter Date: 04/28/2017  Check In:     Session Check In - 04/28/17 0838      Check-In   Location [facility]-Cardiac & Pulmonary Rehab   Staff Present Gerlene Burdock, RN, BSN;Jessica Luan Pulling, MA, ACSM RCEP, Exercise Physiologist;Amanda Oletta Darter, [place], ACSM CEP, Exercise Physiologist   Supervising physician immediately available to respond to emergencies See telemetry face sheet for immediately available ER MD   Medication changes reported     No   Fall or balance concerns reported    No   Warm-up and Cool-down Performed on first and last piece of equipment   Resistance Training Performed Yes   VAD Patient? No     Pain Assessment   Currently in Pain? No/denies         History  Smoking Status  . Former Smoker  . Quit date: 04/06/2007  Smokeless Tobacco  . Never Used    Goals Met:  Independence with exercise equipment Exercise tolerated well No report of cardiac concerns or symptoms Strength training completed today  Goals Unmet:  Not Applicable  Comments: Pt able to follow exercise prescription today without complaint.  Will continue to monitor for progression.    Dr. Emily Filbert is Medical Director for [place] and [facility].

## 2017-04-28 NOTE — Telephone Encounter (Signed)
Patient here in waiting room with his son post testing. Reviewed results and recommendations by Dr. Saunders Revel regarding decrease in Furosemide to 20 mg once daily and take only extra dose as needed for weight gain. Also discussed keeping legs elevated and trying compression hose or socks to help as well. Both patient and son verbalized understanding of conversation, agreement with plan, and had no further questions at this time.

## 2017-04-28 NOTE — Telephone Encounter (Signed)
-----   Message from [name], MD sent at 04/28/2017  4:28 PM EDT ----- Please let Thomas Trujillo know that his kidney function is mildly reduced. I suggest that he take furosemide 20 mg daily and use an extra dose only for weight gain of >2 pounds in 24 hours or 5 pounds in a week. He should elevate his legs to help with the swelling. He should limit his salt intake but drink adequate water to stay well-hydrated.

## 2017-04-30 ENCOUNTER — Other Ambulatory Visit: Payer: Self-pay | Admitting: Physician Assistant

## 2017-05-01 ENCOUNTER — Encounter: Payer: Medicare Other | Admitting: *Deleted

## 2017-05-01 DIAGNOSIS — I214 Non-ST elevation (NSTEMI) myocardial infarction: Secondary | ICD-10-CM | POA: Diagnosis not present

## 2017-05-01 DIAGNOSIS — Z951 Presence of aortocoronary bypass graft: Secondary | ICD-10-CM

## 2017-05-01 NOTE — Progress Notes (Signed)
Daily Session Note  Patient Details  Name: Thomas Trujillo MRN: [id number] Date of Birth: 08/11/1946 Referring Provider:     Cardiac Rehab from 03/30/2017 in [facility] Cardiac and Pulmonary Rehab  Referring Provider  End      Encounter Date: 05/01/2017  Check In:     Session Check In - 05/01/17 0906      Check-In   Location [facility]-Cardiac & Pulmonary Rehab   Staff Present Gerlene Burdock, RN, Moises Blood, BS, ACSM CEP, Exercise Physiologist;Krista Frederico Hamman, RN BSN   Supervising physician immediately available to respond to emergencies See telemetry face sheet for immediately available ER MD   Medication changes reported     No   Fall or balance concerns reported    No   Tobacco Cessation No Change   Warm-up and Cool-down Performed on first and last piece of equipment   Resistance Training Performed Yes   VAD Patient? No     Pain Assessment   Currently in Pain? No/denies   Multiple Pain Sites No         History  Smoking Status  . Former Smoker  . Quit date: 04/06/2007  Smokeless Tobacco  . Never Used    Goals Met:  Independence with exercise equipment Exercise tolerated well No report of cardiac concerns or symptoms Strength training completed today  Goals Unmet:  Not Applicable  Comments: Pt able to follow exercise prescription today without complaint.  Will continue to monitor for progression.    Dr. Emily Filbert is Medical Director for [place] and [facility].

## 2017-05-03 DIAGNOSIS — I214 Non-ST elevation (NSTEMI) myocardial infarction: Secondary | ICD-10-CM

## 2017-05-03 DIAGNOSIS — Z951 Presence of aortocoronary bypass graft: Secondary | ICD-10-CM

## 2017-05-03 NOTE — Progress Notes (Signed)
Daily Session Note  Patient Details  Name: Thomas Trujillo MRN: [id number] Date of Birth: [birth year] Referring Provider:     Cardiac Rehab from 03/30/2017 in [facility] Cardiac and Pulmonary Rehab  Referring Provider  End      Encounter Date: 05/03/2017  Check In:     Session Check In - 05/03/17 0817      Check-In   Location [facility]-Cardiac & Pulmonary Rehab   Staff Present Nyoka Cowden, RN, BSN, MA;Susanne Bice, RN, BSN, CCRP;Jackye Dever Flavia Shipper   Supervising physician immediately available to respond to emergencies See telemetry face sheet for immediately available ER MD   Medication changes reported     No   Fall or balance concerns reported    No   Tobacco Cessation No Change   Warm-up and Cool-down Performed on first and last piece of equipment   Resistance Training Performed Yes   VAD Patient? No     Pain Assessment   Currently in Pain? No/denies   Multiple Pain Sites No           Exercise Prescription Changes - 05/02/17 1500      Response to Exercise   Blood Pressure (Admit) 130/70   Blood Pressure (Exercise) 136/72   Blood Pressure (Exit) 122/70   Heart Rate (Admit) 80 bpm   Heart Rate (Exercise) 115 bpm   Heart Rate (Exit) 103 bpm   Rating of Perceived Exertion (Exercise) 13   Symptoms none   Duration Continue with 45 min of aerobic exercise without signs/symptoms of physical distress.   Intensity THRR unchanged     Progression   Progression Continue to progress workloads to maintain intensity without signs/symptoms of physical distress.   Average METs 2.6     Resistance Training   Training Prescription Yes   Weight 3 lb   Reps 10-15     Interval Training   Interval Training No     Oxygen   Oxygen Continuous   Liters 2     Recumbant Bike   Level 4   Watts 13   Minutes 15   METs 2.59     NuStep   Level 4   Minutes 15   METs 2.6      History  Smoking Status  . Former Smoker  . Quit date: 04/06/2007  Smokeless Tobacco  . Never  Used    Goals Met:  Independence with exercise equipment Exercise tolerated well No report of cardiac concerns or symptoms Strength training completed today  Goals Unmet:  Not Applicable  Comments: Pt able to follow exercise prescription today without complaint.  Will continue to monitor for progression.   Dr. Emily Filbert is Medical Director for [place] and [facility].

## 2017-05-05 ENCOUNTER — Encounter: Payer: Medicare Other | Admitting: *Deleted

## 2017-05-05 DIAGNOSIS — I214 Non-ST elevation (NSTEMI) myocardial infarction: Secondary | ICD-10-CM

## 2017-05-05 DIAGNOSIS — Z951 Presence of aortocoronary bypass graft: Secondary | ICD-10-CM

## 2017-05-05 NOTE — Progress Notes (Signed)
Daily Session Note  Patient Details  Name: Thomas Trujillo MRN: [id number] Date of Birth: June 13, 1946 Referring Provider:     Cardiac Rehab from 03/30/2017 in [facility] Cardiac and Pulmonary Rehab  Referring Provider  End      Encounter Date: 05/05/2017  Check In:     Session Check In - 05/05/17 0913      Check-In   Staff Present Nyoka Cowden, RN, BSN, MA;Kiaya [place], RN, BSN, Lance Sell, BA, ACSM CEP, Exercise Physiologist   Supervising physician immediately available to respond to emergencies See telemetry face sheet for immediately available ER MD   Medication changes reported     No   Fall or balance concerns reported    No   Warm-up and Cool-down Performed on first and last piece of equipment   Resistance Training Performed Yes   VAD Patient? No     Pain Assessment   Currently in Pain? No/denies         History  Smoking Status  . Former Smoker  . Quit date: 04/06/2007  Smokeless Tobacco  . Never Used    Goals Met:  Exercise tolerated well No report of cardiac concerns or symptoms Strength training completed today  Goals Unmet:  Not Applicable  Comments: Doing well with exercise prescription progression.    Dr. Emily Filbert is Medical Director for [place] and [facility].

## 2017-05-08 ENCOUNTER — Telehealth: Payer: Self-pay | Admitting: Internal Medicine

## 2017-05-08 ENCOUNTER — Encounter: Payer: Medicare Other | Admitting: *Deleted

## 2017-05-08 DIAGNOSIS — I214 Non-ST elevation (NSTEMI) myocardial infarction: Secondary | ICD-10-CM

## 2017-05-08 DIAGNOSIS — Z951 Presence of aortocoronary bypass graft: Secondary | ICD-10-CM

## 2017-05-08 NOTE — Telephone Encounter (Signed)
Pt son calling   Pt c/o medication issue:  1. Name of Medication: frusemide   2. How are you currently taking this medication (dosage and times per day)? No sure  3. Are you having a reaction (difficulty breathing--STAT)? No   4. What is your medication issue? It was prescribed 80 mg now it is being prescribed 40 mg, they are told to take half of the 40 mg Would like to make sure the correct dose

## 2017-05-08 NOTE — Telephone Encounter (Signed)
Returned call to patient. Patient had question regarding how to take furosemide. Patient verbalized to take furosemide 20 mg daily and use an extra dose only for weight gain of >2 pounds in 24 hours or 5 pounds in a week as advised by Dr End in telephone note from 04/28/17. Patient was very appreciative of the call.  He will make sure he has the newest refill of furosemide from his pharmacy.

## 2017-05-08 NOTE — Progress Notes (Signed)
Daily Session Note  Patient Details  Name: Christpher Stogsdill MRN: [id number] Date of Birth: 1945/11/14 Referring Provider:     Cardiac Rehab from 03/30/2017 in [facility] Cardiac and Pulmonary Rehab  Referring Provider  End      Encounter Date: 05/08/2017  Check In:     Session Check In - 05/08/17 0752      Check-In   Location [facility]-Cardiac & Pulmonary Rehab   Staff Present Gerlene Burdock, RN, Levie Heritage, MA, ACSM RCEP, Exercise Physiologist;Kelly Amedeo Plenty, BS, ACSM CEP, Exercise Physiologist   Supervising physician immediately available to respond to emergencies See telemetry face sheet for immediately available ER MD   Medication changes reported     No   Fall or balance concerns reported    No   Warm-up and Cool-down Performed on first and last piece of equipment   Resistance Training Performed Yes   VAD Patient? No     Pain Assessment   Currently in Pain? No/denies   Multiple Pain Sites No         History  Smoking Status  . Former Smoker  . Quit date: 04/06/2007  Smokeless Tobacco  . Never Used    Goals Met:  Independence with exercise equipment Exercise tolerated well No report of cardiac concerns or symptoms Strength training completed today  Goals Unmet:  Not Applicable  Comments: Pt able to follow exercise prescription today without complaint.  Will continue to monitor for progression.    Dr. Emily Filbert is Medical Director for [place] and [facility].

## 2017-05-10 ENCOUNTER — Encounter: Payer: Self-pay | Admitting: *Deleted

## 2017-05-10 DIAGNOSIS — I214 Non-ST elevation (NSTEMI) myocardial infarction: Secondary | ICD-10-CM

## 2017-05-10 DIAGNOSIS — Z951 Presence of aortocoronary bypass graft: Secondary | ICD-10-CM

## 2017-05-10 NOTE — Progress Notes (Signed)
Daily Session Note  Patient Details  Name: Thomas Trujillo MRN: [id number] Date of Birth: 1946-03-27 Referring Provider:     Cardiac Rehab from 03/30/2017 in [facility]  Referring Provider  End      Encounter Date: 05/10/2017  Check In:     Session Check In - 05/10/17 0809      Check-In   Staff Present Alberteen Sam, MA, ACSM RCEP, Exercise Physiologist;Krista Frederico Hamman, RN BSN;Lunell Robart Flavia Shipper   Supervising physician immediately available to respond to emergencies See telemetry face sheet for immediately available ER MD   Medication changes reported     No   Fall or balance concerns reported    No   Tobacco Cessation No Change   Warm-up and Cool-down Performed on first and last piece of equipment   Resistance Training Performed Yes   VAD Patient? No     Pain Assessment   Currently in Pain? No/denies   Multiple Pain Sites No         History  Smoking Status  . Former Smoker  . Quit date: 04/06/2007  Smokeless Tobacco  . Never Used    Goals Met:  Independence with exercise equipment Exercise tolerated well No report of cardiac concerns or symptoms Strength training completed today  Goals Unmet:  Not Applicable  Comments: Pt able to follow exercise prescription today without complaint.  Will continue to monitor for progression.   Dr. Emily Filbert is Medical Director for [place] and [facility].

## 2017-05-10 NOTE — Progress Notes (Signed)
Cardiac Individual Treatment Plan  Patient Details  Name: Thomas Trujillo MRN: [id number] Date of Birth: 1946-08-24 Referring Provider:     Cardiac Rehab from 03/30/2017 in [facility] Cardiac and Pulmonary Rehab  Referring Provider  End      Initial Encounter Date:    Cardiac Rehab from 03/30/2017 in [facility] Cardiac and Pulmonary Rehab  Date  03/30/17  Referring Provider  End      Visit Diagnosis: NSTEMI (non-ST elevated myocardial infarction) (Thomas Trujillo)  S/P CABG x 2  Patient's Home Medications on Admission:  Current Outpatient Prescriptions:  .  acetaminophen (TYLENOL) 500 MG tablet, Take 500 mg by mouth every 6 (six) hours as needed., Disp: , Rfl:  .  aspirin 325 MG EC tablet, Take 1 tablet (325 mg total) by mouth daily., Disp: 30 tablet, Rfl: 0 .  atorvastatin (LIPITOR) 40 MG tablet, Take 1 tablet (40 mg total) by mouth daily., Disp: 90 tablet, Rfl: 3 .  bisacodyl (DULCOLAX) 5 MG EC tablet, Take 2 tablets (10 mg total) by mouth daily as needed for moderate constipation (Give daily if no BM)., Disp: 30 tablet, Rfl: 0 .  ferrous sulfate 325 (65 FE) MG tablet, Take 325 mg by mouth daily with breakfast., Disp: , Rfl:  .  furosemide (LASIX) 20 MG tablet, TAKE 1 TABLET BY MOUTH EVERY DAY, Disp: 5 tablet, Rfl: 0 .  furosemide (LASIX) 20 MG tablet, Take 1-2 tablets (20-40 mg total) by mouth as directed. Once daily with additional dose if he gains 2 pounds in a day or 5 pounds in a week., Disp: 60 tablet, Rfl: 3 .  ipratropium-albuterol (DUONEB) 0.5-2.5 (3) MG/3ML SOLN, Take 3 mLs by nebulization every 6 (six) hours as needed. When awake , Disp: , Rfl:  .  KLOR-CON M10 10 MEQ tablet, TAKE 1 TABLET BY MOUTH EVERY DAY, Disp: 5 tablet, Rfl: 0 .  metoprolol tartrate (LOPRESSOR) 25 MG tablet, Take 12.5 mg by mouth 2 (two) times daily. Hold for SBP< 110 or HR <60, Disp: , Rfl:  .  OXYGEN, Inhale into the lungs. By nasal cannula and wean as tolerated to keep O2 sats >90%, Disp: , Rfl:  .  potassium chloride  (K-DUR) 10 MEQ tablet, Take 10 mEq by mouth daily., Disp: , Rfl:  .  tamsulosin (FLOMAX) 0.4 MG CAPS capsule, Take 1 capsule (0.4 mg total) by mouth daily., Disp: 30 capsule, Rfl: 1  Past Medical History: Past Medical History:  Diagnosis Date  . Bleb, lung ([place])   . Coronary artery disease 02/14/2017   NSTEMI with urgent CABG (LIMA-LAD and SVG->ramus)  . Hyperlipidemia   . Ischemic cardiomyopathy   . Patient denies medical problems     Tobacco Use: History  Smoking Status  . Former Smoker  . Quit date: 04/06/2007  Smokeless Tobacco  . Never Used    Labs: Recent Review Flowsheet Data    Labs for ITP Cardiac and Pulmonary Rehab Latest Ref Rng & Units 02/15/2017 02/15/2017 02/15/2017 02/18/2017 02/18/2017   Cholestrol 0 - 200 mg/dL - - - - -   LDLCALC 0 - 99 mg/dL - - - - -   HDL >40 mg/dL - - - - -   Trlycerides <150 mg/dL - - - - -   Hemoglobin A1c 4.8 - 5.6 % - - - - -   PHART 7.350 - 7.450 7.316(L) 7.344(L) - 7.393 -   PCO2ART 32.0 - 48.0 mmHg 46.8 42.7 - 50.1(H) -   HCO3 20.0 - 28.0 mmol/L 23.7 23.3 -  30.5(H) -   TCO2 0 - 100 mmol/L 25 25 30  32 30   ACIDBASEDEF 0.0 - 2.0 mmol/L 2.0 2.0 - - -   O2SAT % 96.0 96.0 - 98.0 -       Exercise Target Goals:    Exercise Program Goal: Individual exercise prescription set with THRR, safety & activity barriers. Participant demonstrates ability to understand and report RPE using BORG scale, to self-measure pulse accurately, and to acknowledge the importance of the exercise prescription.  Exercise Prescription Goal: Starting with aerobic activity 30 plus minutes a day, 3 days per week for initial exercise prescription. Provide home exercise prescription and guidelines that participant acknowledges understanding prior to discharge.  Activity Barriers & Risk Stratification:   6 Minute Walk:     6 Minute Walk    Row Name 03/30/17 1528         6 Minute Walk   Distance 810 feet     Walk Time 4.5 minutes     # of Rest Breaks 2      MPH 2.04     METS 2.6     RPE 17     Perceived Dyspnea  3     VO2 Peak 9.11     Symptoms No     Resting HR 106 bpm     Resting BP 102/56     Max Ex. HR 126 bpm     Max Ex. BP 162/60     2 Minute Post BP 112/58       Interval Oxygen   Interval Oxygen? Yes     Baseline Oxygen Saturation % 97 %     Baseline Liters of Oxygen 2 L     1 Minute Oxygen Saturation % 96 %     1 Minute Liters of Oxygen 2 L     2 Minute Oxygen Saturation % 94 %     2 Minute Liters of Oxygen 2 L     3 Minute Liters of Oxygen 2 L     4 Minute Oxygen Saturation % 93 %     4 Minute Liters of Oxygen 2 L     5 Minute Liters of Oxygen 2 L     6 Minute Oxygen Saturation % 94 %     6 Minute Liters of Oxygen 2 L     2 Minute Post Liters of Oxygen 2 L        Oxygen Initial Assessment:     Oxygen Initial Assessment - 03/30/17 1540      Initial 6 min Walk   Resting Oxygen Saturation  during 6 min walk 96 %   Exercise Oxygen Saturation  during 6 min walk 94 %     Program Oxygen Prescription   Program Oxygen Prescription Continuous   Liters per minute 2   Comments Thomas Trujillo has been told he does not need oxygen.  Sat dropped to 85% when he started walk test.  PLaced oxygen on at 2l     Intervention   Short Term Goals To learn and exhibit compliance with exercise, home and travel O2 prescription;To learn and understand importance of monitoring SPO2 with pulse oximeter and demonstrate accurate use of the pulse oximeter.;To Learn and understand importance of maintaining oxygen saturations>88%   Long  Term Goals Exhibits compliance with exercise, home and travel O2 prescription;Verbalizes importance of monitoring SPO2 with pulse oximeter and return demonstration;Maintenance of O2 saturations>88%;Exhibits proper breathing techniques, such as purse lipped breathing or other method taught  during program session      Oxygen Re-Evaluation:     Oxygen Re-Evaluation    Row Name 04/07/17 0856 04/21/17 0841            Program Oxygen Prescription   Program Oxygen Prescription Continuous Continuous      Liters per minute 2  -      Comments  - Thomas Trujillo arrives on 2 liters of oxgyen that he reports he has had since surgery this year. Thomas Trujillo said he didn't use his oxgyen to sleep last night. He reported that he shut off his oxgyen for a few minutes while riding the bicycle in Cardiac Rehab today.        Home Oxygen   Home Oxygen Device Home Concentrator;E-Tanks  -      Sleep Oxygen Prescription None  -      Home Exercise Oxygen Prescription Continuous Continuous      Liters per minute 2 2      Home at Rest Exercise Oxygen Prescription None  -      Compliance with Home Oxygen Use Yes Yes        Goals/Expected Outcomes   Short Term Goals  - To learn and exhibit compliance with exercise, home and travel O2 prescription;To learn and understand importance of monitoring SPO2 with pulse oximeter and demonstrate accurate use of the pulse oximeter.      Comments Tannar used our oxgyen tank on 2liters/minute during Cardiac Rehab then switched to his home oxgyen when he left to go home.   -         Oxygen Discharge (Final Oxygen Re-Evaluation):     Oxygen Re-Evaluation - 04/21/17 0841      Program Oxygen Prescription   Program Oxygen Prescription Continuous   Comments Arthor arrives on 2 liters of oxgyen that he reports he has had since surgery this year. Eluterio said he didn't use his oxgyen to sleep last night. He reported that he shut off his oxgyen for a few minutes while riding the bicycle in Cardiac Rehab today.     Home Oxygen   Home Exercise Oxygen Prescription Continuous   Liters per minute 2   Compliance with Home Oxygen Use Yes     Goals/Expected Outcomes   Short Term Goals To learn and exhibit compliance with exercise, home and travel O2 prescription;To learn and understand importance of monitoring SPO2 with pulse oximeter and demonstrate accurate use of the pulse oximeter.      Initial Exercise  Prescription:     Initial Exercise Prescription - 03/30/17 1500      Date of Initial Exercise RX and Referring Provider   Date 03/30/17   Referring Provider End     Treadmill   MPH 2   Grade 0   Minutes 15   METs 2.53     Recumbant Bike   Level 1   RPM 50   Watts 13   Minutes 15   METs 2.5     NuStep   Level 2   SPM 80   Minutes 15   METs 2.5     Biostep-RELP   Level 2   SPM 50   Minutes 15   METs 2     Prescription Details   Frequency (times per week) 3   Duration Progress to 30 minutes of continuous aerobic without signs/symptoms of physical distress     Intensity   THRR 40-80% of Max Heartrate 123-141   Ratings of Perceived Exertion 11-13  Perceived Dyspnea 0-4     Resistance Training   Training Prescription Yes   Weight 3   Reps 10-15      Perform Capillary Blood Glucose checks as needed.  Exercise Prescription Changes:     Exercise Prescription Changes    Row Name 03/30/17 1500 04/05/17 1600 04/12/17 0900 04/18/17 1200 05/02/17 1500     Response to Exercise   Blood Pressure (Admit) 102/56 122/50  - 108/56 130/70   Blood Pressure (Exercise) 162/60 116/50  - 138/78 136/72   Blood Pressure (Exit) 112/58 118/64  - 138/70 122/70   Heart Rate (Admit) 109 bpm 102 bpm  - 90 bpm 80 bpm   Heart Rate (Exercise) 129 bpm 116 bpm  - 117 bpm 115 bpm   Heart Rate (Exit) 111 bpm 102 bpm  - 84 bpm 103 bpm   Oxygen Saturation (Admit) 96 % 97 %  -  -  -   Oxygen Saturation (Exercise) 94 %  -  -  -  -   Rating of Perceived Exertion (Exercise)  - 12  - 13 13   Symptoms  - none none none none   Duration  - Progress to 45 minutes of aerobic exercise without signs/symptoms of physical distress Progress to 45 minutes of aerobic exercise without signs/symptoms of physical distress Continue with 45 min of aerobic exercise without signs/symptoms of physical distress. Continue with 45 min of aerobic exercise without signs/symptoms of physical distress.   Intensity  -  THRR unchanged THRR unchanged THRR unchanged THRR unchanged     Progression   Progression  - Continue to progress workloads to maintain intensity without signs/symptoms of physical distress. Continue to progress workloads to maintain intensity without signs/symptoms of physical distress. Continue to progress workloads to maintain intensity without signs/symptoms of physical distress. Continue to progress workloads to maintain intensity without signs/symptoms of physical distress.   Average METs  - 2.5 2.5 2.55 2.6     Resistance Training   Training Prescription  - Yes Yes Yes Yes   Weight  - 3 lbs 3 lbs 3 lbs 3 lb   Reps  - 10-15 10-15 10-15 10-15     Interval Training   Interval Training  - No No No No     Oxygen   Oxygen  -  -  -  - Continuous   Liters  -  -  -  - 2     Treadmill   MPH  -  -  - 2  -   Grade  -  -  - 0.5  -   Minutes  -  -  - 15  -   METs  -  -  - 2.67  -     Recumbant Bike   Level  - 1 1 4 4    Watts  - 13 13 16 13    Minutes  - 15 15 15 15    METs  - 2.59 2.59 2.67 2.59     NuStep   Level  - 2 2 2 4    Minutes  - 15 15 15 15    METs  - 2.5 2.5 2.3 2.6     Home Exercise Plan   Plans to continue exercise at  -  - Home (comment)  walking Home (comment)  walking  -   Frequency  -  - Add 2 additional days to program exercise sessions. Add 2 additional days to program exercise sessions.  -   Initial Home  Exercises Provided  -  - 04/12/17 04/12/17  -      Exercise Comments:     Exercise Comments    Row Name 04/03/17 0912 04/05/17 0956 04/19/17 0810       Exercise Comments First full day of exercise!  Patient was oriented to gym and equipment including functions, settings, policies, and procedures.  Patient's individual exercise prescription and treatment plan were reviewed.  All starting workloads were established based on the results of the 6 minute walk test done at initial orientation visit.  The plan for exercise progression was also introduced and  progression will be customized based on patient's performance and goals Marce completed an abbreviated exercise session due to  Dr appointment. Reviewed METs average and discussed progression with pt today.        Exercise Goals and Review:     Exercise Goals    Row Name 03/30/17 1528             Exercise Goals   Increase Physical Activity Yes       Intervention Provide advice, education, support and counseling about physical activity/exercise needs.;Develop an individualized exercise prescription for aerobic and resistive training based on initial evaluation findings, risk stratification, comorbidities and participant's personal goals.       Expected Outcomes Achievement of increased cardiorespiratory fitness and enhanced flexibility, muscular endurance and strength shown through measurements of functional capacity and personal statement of participant.       Increase Strength and Stamina Yes       Intervention Provide advice, education, support and counseling about physical activity/exercise needs.;Develop an individualized exercise prescription for aerobic and resistive training based on initial evaluation findings, risk stratification, comorbidities and participant's personal goals.       Expected Outcomes Achievement of increased cardiorespiratory fitness and enhanced flexibility, muscular endurance and strength shown through measurements of functional capacity and personal statement of participant.          Exercise Goals Re-Evaluation :     Exercise Goals Re-Evaluation    Row Name 04/05/17 1600 04/12/17 0934 04/18/17 1157 05/02/17 1549       Exercise Goal Re-Evaluation   Exercise Goals Review Increase Physical Activity;Increase Strenth and Stamina Increase Physical Activity;Increase Strenth and Stamina Increase Physical Activity;Increase Strenth and Stamina Increase Physical Activity;Increase Strenth and Stamina    Comments Yoskar has completed two exercise days with rehab so  far.  He is off to good a start.  We will continue to monitor his progression. Reviewed home exercise with pt today.  Pt plans to walk at home for exercise.  Reviewed THR, pulse, RPE, sign and symptoms, and when to call 911 or MD.  Also discussed weather considerations and indoor options.  Pt voiced understanding. Maxime is doing well in rehab.  He is now up to 2.0 mph on the treadmill.  We will continue to monitor his progression. Onyekachi has increased his resistnace on the NuStep.  Staff will continue to monitor progression.    Expected Outcomes Continue to come to classes to work on strength and stamina. Short: Add in at least one day at home.  Long: Make exercise part of routine Short: Increase work load on NuStep.  Long: Continue to exercise regularly. Short - Continue regular attendance Long - Continue to improve overall functional fitness.       Discharge Exercise Prescription (Final Exercise Prescription Changes):     Exercise Prescription Changes - 05/02/17 1500      Response to Exercise  Blood Pressure (Admit) 130/70   Blood Pressure (Exercise) 136/72   Blood Pressure (Exit) 122/70   Heart Rate (Admit) 80 bpm   Heart Rate (Exercise) 115 bpm   Heart Rate (Exit) 103 bpm   Rating of Perceived Exertion (Exercise) 13   Symptoms none   Duration Continue with 45 min of aerobic exercise without signs/symptoms of physical distress.   Intensity THRR unchanged     Progression   Progression Continue to progress workloads to maintain intensity without signs/symptoms of physical distress.   Average METs 2.6     Resistance Training   Training Prescription Yes   Weight 3 lb   Reps 10-15     Interval Training   Interval Training No     Oxygen   Oxygen Continuous   Liters 2     Recumbant Bike   Level 4   Watts 13   Minutes 15   METs 2.59     NuStep   Level 4   Minutes 15   METs 2.6      Nutrition:  Target Goals: Understanding of nutrition guidelines, daily intake of sodium  1500mg , cholesterol 200mg , calories 30% from fat and 7% or less from saturated fats, daily to have 5 or more servings of fruits and vegetables.  Biometrics:     Pre Biometrics - 03/30/17 1527      Pre Biometrics   Height 5\' 4"  (1.626 m)   Weight 152 lb 1.6 oz (69 kg)   Waist Circumference 37.25 inches   Hip Circumference 39.5 inches   Waist to Hip Ratio 0.94 %   BMI (Calculated) 26.2   Single Leg Stand 30 seconds       Nutrition Therapy Plan and Nutrition Goals:     Nutrition Therapy & Goals - 04/17/17 1058      Nutrition Therapy   Diet basic heart healthy   Drug/Food Interactions Statins/Certain Fruits   Sodium 1500 grams     Personal Nutrition Goals   Nutrition Goal continue with current heart healthy eating pattern   Personal Goal #2 Use menus provided to help plan balanced meals when no longer receiving chef services   Comments 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 He is controlling sodium intake and limiting unhealthy fats while including generous vegetable and fruit portions. He chooses whole grain foods and eats beans regularly.      Intervention Plan   Intervention Prescribe, educate and counsel regarding individualized specific dietary modifications aiming towards targeted core components such as weight, hypertension, lipid management, diabetes, heart failure and other comorbidities.;Nutrition handout(s) given to patient.   Expected Outcomes Short Term Goal: A plan has been developed with personal nutrition goals set during dietitian appointment.;Long Term Goal: Adherence to prescribed nutrition plan.      Nutrition Discharge: Rate Your Plate Scores:     Nutrition Assessments - 03/30/17 1545      MEDFICTS Scores   Pre Score 6      Nutrition Goals Re-Evaluation:     Nutrition Goals Re-Evaluation    Row Name 04/21/17 [phone]             Goals   Comment When I asked Nalu how his appt went  with the Cardiac REhab REgistered Dietician he said it was very helpful. Cameo reports that "he has a Facilities manager from his church that cooks his lunch and dinner for him".  Expected Outcome Heart Healthy eating plus eating for his COPD          Nutrition Goals Discharge (Final Nutrition Goals Re-Evaluation):     Nutrition Goals Re-Evaluation - 04/21/17 4196      Goals   Comment When I asked Salil how his appt went with the Cardiac REhab REgistered Dietician he said it was very helpful. Won reports that "he has a Facilities manager from his church that cooks his lunch and dinner for him".    Expected Outcome Heart Healthy eating plus eating for his COPD      Psychosocial: Target Goals: Acknowledge presence or absence of significant depression and/or stress, maximize coping skills, provide positive support system. Participant is able to verbalize types and ability to use techniques and skills needed for reducing stress and depression.   Initial Review & Psychosocial Screening:     Initial Psych Review & Screening - 03/30/17 1547      Initial Review   Current issues with Current Sleep Concerns  Sleeping in recliner so he can breath easier. Was not sleeping well prior to surgery. LIves in first floor apartment and noise from second floor would wake him up and messed his schedule up for being up and sleep.  Since came home has oxygen concentrator     Family Dynamics   Good Support System? Yes  son, son's church   Comments Sleeping better since oxygen concentrator in use.  Blocks out the noise from the apartment above him.      Barriers   Psychosocial barriers to participate in program There are no identifiable barriers or psychosocial needs.;The patient should benefit from training in stress management and relaxation.     Screening Interventions   Interventions Encouraged to exercise;To provide support and resources with identified psychosocial needs;Provide feedback about the  scores to participant;Program counselor consult      Quality of Life Scores:      Quality of Life - 03/30/17 1549      Quality of Life Scores   Health/Function Pre 23.46 %   Socioeconomic Pre 29 %   Psych/Spiritual Pre 30 %   Family Pre 28.5 %   GLOBAL Pre 26.58 %      PHQ-9: Recent Review Flowsheet Data    Depression screen [facility] 2/9 03/30/2017   Decreased Interest 2   Down, Depressed, Hopeless 0   PHQ - 2 Score 2   Altered sleeping 3    Tired, decreased energy 3    Change in appetite 1    Feeling bad or failure about yourself  0   Trouble concentrating 0   Moving slowly or fidgety/restless 0   Suicidal thoughts 0   PHQ-9 Score 9   Difficult doing work/chores Not difficult at all     Interpretation of Total Score  Total Score Depression Severity:  1-4 = Minimal depression, 5-9 = Mild depression, 10-14 = Moderate depression, 15-19 = Moderately severe depression, 20-27 = Severe depression   Psychosocial Evaluation and Intervention:   Psychosocial Re-Evaluation:     Psychosocial Re-Evaluation    [place] Name 04/21/17 [phone]             Psychosocial Re-Evaluation   Current issues with Current Sleep Concerns       Comments Caidan reports that last night he didn't use his oxgyen and he did well. Tavi said that he has a Facilities manager from his church cook his lunch and dinner for him and he makes his own breakfast. Cloud's  son has bought him to Cardiac REhab since he started.        Expected Outcomes Daimion -heart healthy lifestyle.       Interventions Encouraged to attend Cardiac Rehabilitation for the exercise       Continue Psychosocial Services  Follow up required by staff          Psychosocial Discharge (Final Psychosocial Re-Evaluation):     Psychosocial Re-Evaluation - 04/21/17 0851      Psychosocial Re-Evaluation   Current issues with Current Sleep Concerns   Comments Nolyn reports that last night he didn't use his oxgyen and he did well. Cauy said that he has a  Facilities manager from his church cook his lunch and dinner for him and he makes his own breakfast. Jamai's son has bought him to Cardiac REhab since he started.    Expected Outcomes [place] -heart healthy lifestyle.   Interventions Encouraged to attend Cardiac Rehabilitation for the exercise   Continue Psychosocial Services  Follow up required by staff      Vocational Rehabilitation: Provide vocational rehab assistance to qualifying candidates.   Vocational Rehab Evaluation & Intervention:     Vocational Rehab - 03/30/17 1552      Initial Vocational Rehab Evaluation & Intervention   Assessment shows need for Vocational Rehabilitation No      Education: Education Goals: Education classes will be provided on a weekly basis, covering required topics. Participant will state understanding/return demonstration of topics presented.  Learning Barriers/Preferences:     Learning Barriers/Preferences - 03/30/17 1552      Learning Barriers/Preferences   Learning Barriers None   Learning Preferences Skilled Demonstration;Pictoral;Individual Instruction;Written Material;Video      Education Topics: General Nutrition Guidelines/Fats and Fiber: -Group instruction provided by verbal, written material, models and posters to present the general guidelines for heart healthy nutrition. Gives an explanation and review of dietary fats and fiber.   Cardiac Rehab from 05/08/2017 in [facility] Cardiac and Pulmonary Rehab  Date  04/17/17  Educator  CR  Instruction Review Code  2- meets goals/outcomes      Controlling Sodium/Reading Food Labels: -Group verbal and written material supporting the discussion of sodium use in heart healthy nutrition. Review and explanation with models, verbal and written materials for utilization of the food label.   Cardiac Rehab from 05/08/2017 in [facility] Cardiac and Pulmonary Rehab  Date  04/24/17  Educator  CR  Instruction Review Code  2- meets goals/outcomes      Exercise  Physiology & Risk Factors: - Group verbal and written instruction with models to review the exercise physiology of the cardiovascular system and associated critical values. Details cardiovascular disease risk factors and the goals associated with each risk factor.   Cardiac Rehab from 05/08/2017 in [facility] Cardiac and Pulmonary Rehab  Date  05/01/17  Educator  [facility]  Instruction Review Code  2- meets goals/outcomes      Aerobic Exercise & Resistance Training: - Gives group verbal and written discussion on the health impact of inactivity. On the components of aerobic and resistive training programs and the benefits of this training and how to safely progress through these programs.   Cardiac Rehab from 05/08/2017 in [facility] Cardiac and Pulmonary Rehab  Date  05/03/17  Educator  SB  Instruction Review Code  2- meets goals/outcomes      Flexibility, Balance, General Exercise Guidelines: - Provides group verbal and written instruction on the benefits of flexibility and balance training programs. Provides general exercise guidelines with specific  guidelines to those with heart or lung disease. Demonstration and skill practice provided.   Cardiac Rehab from 05/08/2017 in [facility] Cardiac and Pulmonary Rehab  Date  05/08/17  Educator  [facility]  Instruction Review Code  2- meets goals/outcomes      Stress Management: - Provides group verbal and written instruction about the health risks of elevated stress, cause of high stress, and healthy ways to reduce stress.   Depression: - Provides group verbal and written instruction on the correlation between heart/lung disease and depressed mood, treatment options, and the stigmas associated with seeking treatment.   Cardiac Rehab from 05/08/2017 in [facility] Cardiac and Pulmonary Rehab  Date  04/19/17  Educator  [facility]  Instruction Review Code  2- meets goals/outcomes      Anatomy & Physiology of the Heart: - Group verbal and written instruction and models provide basic  cardiac anatomy and physiology, with the coronary electrical and arterial systems. Review of: AMI, Angina, Valve disease, Heart Failure, Cardiac Arrhythmia, Pacemakers, and the ICD.   Cardiac Procedures: - Group verbal and written instruction and models to describe the testing methods done to diagnose heart disease. Reviews the outcomes of the test results. Describes the treatment choices: Medical Management, Angioplasty, or Coronary Bypass Surgery.   Cardiac Medications: - Group verbal and written instruction to review commonly prescribed medications for heart disease. Reviews the medication, class of the drug, and side effects. Includes the steps to properly store meds and maintain the prescription regimen.   Cardiac Rehab from 05/08/2017 in [facility] Cardiac and Pulmonary Rehab  Date  04/03/17  Educator  CE  Instruction Review Code  2- meets goals/outcomes      Go Sex-Intimacy & Heart Disease, Get SMART - Goal Setting: - Group verbal and written instruction through game format to discuss heart disease and the return to sexual intimacy. Provides group verbal and written material to discuss and apply goal setting through the application of the S.M.A.R.T. Method.   Other Matters of the Heart: - Provides group verbal, written materials and models to describe Heart Failure, Angina, Valve Disease, and Diabetes in the realm of heart disease. Includes description of the disease process and treatment options available to the cardiac patient.   Exercise & Equipment Safety: - Individual verbal instruction and demonstration of equipment use and safety with use of the equipment.   Cardiac Rehab from 05/08/2017 in [facility] Cardiac and Pulmonary Rehab  Date  03/30/17  Educator  SB  Instruction Review Code  2- meets goals/outcomes      Infection Prevention: - Provides verbal and written material to individual with discussion of infection control including proper hand washing and proper equipment cleaning  during exercise session.   Cardiac Rehab from 05/08/2017 in [facility] Cardiac and Pulmonary Rehab  Date  03/30/17  Educator  Sb  Instruction Review Code  2- meets goals/outcomes      Falls Prevention: - Provides verbal and written material to individual with discussion of falls prevention and safety.   Cardiac Rehab from 05/08/2017 in [facility] Cardiac and Pulmonary Rehab  Date  03/30/17  Educator  SB  Instruction Review Code  2- meets goals/outcomes      Diabetes: - Individual verbal and written instruction to review signs/symptoms of diabetes, desired ranges of glucose level fasting, after meals and with exercise. Advice that pre and post exercise glucose checks will be done for 3 sessions at entry of program.    Knowledge Questionnaire Score:     Knowledge Questionnaire Score -  03/30/17 1552      Knowledge Questionnaire Score   Pre Score --  Has not completed this yet      Core Components/Risk Factors/Patient Goals at Admission:     Personal Goals and Risk Factors at Admission - 03/30/17 1546      Core Components/Risk Factors/Patient Goals on Admission   Lipids Yes   Intervention Provide education and support for participant on nutrition & aerobic/resistive exercise along with prescribed medications to achieve LDL 70mg , HDL >40mg .   Expected Outcomes Short Term: Participant states understanding of desired cholesterol values and is compliant with medications prescribed. Participant is following exercise prescription and nutrition guidelines.;Long Term: Cholesterol controlled with medications as prescribed, with individualized exercise RX and with personalized nutrition plan. Value goals: LDL < 70mg , HDL > 40 mg.      Core Components/Risk Factors/Patient Goals Review:      Goals and Risk Factor Review    Row Name 04/21/17 0849             Core Components/Risk Factors/Patient Goals Review   Personal Goals Review Lipids       Review Antone reports that his "personal chef  has all the heart healthy rules and diets and he cooks all my lunches and dinners for me". Harim reports that he makes his own breakfast and eats healty. Shadrick gets his lipids checked by his MD.        Expected Outcomes Heart healthy living will using 2 liters of oxgyen currently since his surgeyr.          Core Components/Risk Factors/Patient Goals at Discharge (Final Review):      Goals and Risk Factor Review - 04/21/17 0849      Core Components/Risk Factors/Patient Goals Review   Personal Goals Review Lipids   Review Ryatt reports that his "personal chef has all the heart healthy rules and diets and he cooks all my lunches and dinners for me". Sufyan reports that he makes his own breakfast and eats healty. Wayburn gets his lipids checked by his MD.    Expected Outcomes Heart healthy living will using 2 liters of oxgyen currently since his surgeyr.      ITP Comments:     ITP Comments    Row Name 03/30/17 1537 04/07/17 0857 04/12/17 0648 04/21/17 0836 05/10/17 3845   ITP Comments Medical review completed, INitial ITP created   [facility] of [facility] Uses oxgyen 2 liters/minute.  30 day review. Continue with ITP unless directed changes per Medical Director review. New to program [place] arrives on 2 liters of oxgyen that he reports he has had since surgery this year. Jackie said he didn't use his oxgyen to sleep last night. He reported that he shut off his oxgyen for a few minutes while riding the bicycle in Cardiac Rehab today.  30 day review. Continue with ITP unless directed changes per Medical Director review         Comments:

## 2017-05-12 DIAGNOSIS — I214 Non-ST elevation (NSTEMI) myocardial infarction: Secondary | ICD-10-CM | POA: Diagnosis not present

## 2017-05-12 DIAGNOSIS — Z951 Presence of aortocoronary bypass graft: Secondary | ICD-10-CM

## 2017-05-12 NOTE — Progress Notes (Signed)
Daily Session Note  Patient Details  Name: Baruch Lewers MRN: [id number] Date of Birth: 11-Jan-1946 Referring Provider:     Cardiac Rehab from 03/30/2017 in [facility] Cardiac and Pulmonary Rehab  Referring Provider  End      Encounter Date: 05/12/2017  Check In:     Session Check In - 05/12/17 0854      Check-In   Location [facility]-Cardiac & Pulmonary Rehab   Staff Present Alberteen Sam, MA, ACSM RCEP, Exercise Physiologist;Amanda Oletta Darter, [place], ACSM CEP, Exercise Physiologist;Diane [facility] RN,BSN   Supervising physician immediately available to respond to emergencies See telemetry face sheet for immediately available ER MD   Medication changes reported     No   Fall or balance concerns reported    No   Warm-up and Cool-down Performed on first and last piece of equipment   Resistance Training Performed Yes   VAD Patient? No     Pain Assessment   Currently in Pain? No/denies         History  Smoking Status  . Former Smoker  . Quit date: 04/06/2007  Smokeless Tobacco  . Never Used    Goals Met:  Independence with exercise equipment Exercise tolerated well No report of cardiac concerns or symptoms Strength training completed today  Goals Unmet:  Not Applicable  Comments: Pt able to follow exercise prescription today without complaint.  Will continue to monitor for progression.    Dr. Emily Filbert is Medical Director for [place] and [facility].

## 2017-05-15 ENCOUNTER — Encounter: Payer: Medicare Other | Admitting: *Deleted

## 2017-05-15 ENCOUNTER — Other Ambulatory Visit: Payer: Medicare Other

## 2017-05-15 DIAGNOSIS — I214 Non-ST elevation (NSTEMI) myocardial infarction: Secondary | ICD-10-CM

## 2017-05-15 DIAGNOSIS — Z951 Presence of aortocoronary bypass graft: Secondary | ICD-10-CM

## 2017-05-15 NOTE — Progress Notes (Signed)
Daily Session Note  Patient Details  Name: Thomas Trujillo MRN: [id number] Date of Birth: 06/12/1946 Referring Provider:     Cardiac Rehab from 03/30/2017 in [facility] Cardiac and Pulmonary Rehab  Referring Provider  End      Encounter Date: 05/15/2017  Check In:     Session Check In - 05/15/17 0748      Check-In   Location [facility]-Cardiac & Pulmonary Rehab   Staff Present Gerlene Burdock, RN, Levie Heritage, MA, ACSM RCEP, Exercise Physiologist;Ita Fritzsche Amedeo Plenty, BS, ACSM CEP, Exercise Physiologist   Supervising physician immediately available to respond to emergencies See telemetry face sheet for immediately available ER MD   Medication changes reported     No   Fall or balance concerns reported    No   Warm-up and Cool-down Performed on first and last piece of equipment   Resistance Training Performed Yes   VAD Patient? No     Pain Assessment   Currently in Pain? No/denies   Multiple Pain Sites No         History  Smoking Status  . Former Smoker  . Quit date: 04/06/2007  Smokeless Tobacco  . Never Used    Goals Met:  Independence with exercise equipment Personal goals reviewed No report of cardiac concerns or symptoms Strength training completed today  Goals Unmet:  Not Applicable  Comments: Pt able to follow exercise prescription today without complaint.  Will continue to monitor for progression.    Dr. Emily Filbert is Medical Director for [place] and [facility].

## 2017-05-17 DIAGNOSIS — I214 Non-ST elevation (NSTEMI) myocardial infarction: Secondary | ICD-10-CM | POA: Diagnosis not present

## 2017-05-17 DIAGNOSIS — Z951 Presence of aortocoronary bypass graft: Secondary | ICD-10-CM

## 2017-05-17 NOTE — Progress Notes (Signed)
Daily Session Note  Patient Details  Name: Thomas Trujillo MRN: [id number] Date of Birth: 08/12/1946 Referring Provider:     Cardiac Rehab from 03/30/2017 in [facility] Cardiac and Pulmonary Rehab  Referring Provider  End      Encounter Date: 05/17/2017  Check In:     Session Check In - 05/17/17 0757      Check-In   Location [facility]-Cardiac & Pulmonary Rehab   Staff Present Heath Lark, RN, BSN, CCRP;Jessica Luan Pulling, MA, ACSM RCEP, Exercise Physiologist;Alicia Ackert Flavia Shipper   Supervising physician immediately available to respond to emergencies See telemetry face sheet for immediately available ER MD   Medication changes reported     No   Fall or balance concerns reported    No   Warm-up and Cool-down Performed on first and last piece of equipment   Resistance Training Performed Yes   VAD Patient? No     Pain Assessment   Currently in Pain? No/denies   Multiple Pain Sites No         History  Smoking Status  . Former Smoker  . Quit date: 04/06/2007  Smokeless Tobacco  . Never Used    Goals Met:  Independence with exercise equipment Exercise tolerated well Personal goals reviewed No report of cardiac concerns or symptoms Strength training completed today  Goals Unmet:  Not Applicable  Comments: Pt able to follow exercise prescription today without complaint.  Will continue to monitor for progression.   Dr. Emily Filbert is Medical Director for [place] and [facility].

## 2017-05-19 ENCOUNTER — Encounter: Payer: Medicare Other | Admitting: *Deleted

## 2017-05-19 DIAGNOSIS — I214 Non-ST elevation (NSTEMI) myocardial infarction: Secondary | ICD-10-CM

## 2017-05-19 DIAGNOSIS — Z951 Presence of aortocoronary bypass graft: Secondary | ICD-10-CM

## 2017-05-19 NOTE — Progress Notes (Signed)
Daily Session Note  Patient Details  Name: Thomas Trujillo MRN: [id number] Date of Birth: 12/15/1945 Referring Provider:     Cardiac Rehab from 03/30/2017 in [facility] Cardiac and Pulmonary Rehab  Referring Provider  End      Encounter Date: 05/19/2017  Check In:     Session Check In - 05/19/17 0836      Check-In   Location [facility]-Cardiac & Pulmonary Rehab   Staff Present Gerlene Burdock, RN, BSN;Keyli Duross Luan Pulling, MA, ACSM RCEP, Exercise Physiologist;Amanda Oletta Darter, BA, ACSM CEP, Exercise Physiologist   Supervising physician immediately available to respond to emergencies See telemetry face sheet for immediately available ER MD   Medication changes reported     No   Fall or balance concerns reported    No   Warm-up and Cool-down Performed on first and last piece of equipment   Resistance Training Performed Yes   VAD Patient? No     Pain Assessment   Currently in Pain? No/denies   Multiple Pain Sites No           Exercise Prescription Changes - 05/18/17 1100      Response to Exercise   Blood Pressure (Admit) 110/58   Blood Pressure (Exercise) 132/60   Blood Pressure (Exit) 126/70   Heart Rate (Admit) 96 bpm   Heart Rate (Exercise) 105 bpm   Heart Rate (Exit) 77 bpm   Rating of Perceived Exertion (Exercise) 12   Symptoms none   Duration Continue with 45 min of aerobic exercise without signs/symptoms of physical distress.   Intensity THRR unchanged     Progression   Progression Continue to progress workloads to maintain intensity without signs/symptoms of physical distress.   Average METs 2.98     Resistance Training   Training Prescription Yes   Weight 3 lb   Reps 10-15     Interval Training   Interval Training No     Oxygen   Oxygen Continuous  only on treadmill   Liters 2     Treadmill   MPH 2   Grade 1   Minutes 15   METs 2.81     Recumbant Bike   Level 4   Watts 27   Minutes 15   METs 3.35     NuStep   Level 4   Minutes 15   METs 2.8     Home  Exercise Plan   Plans to continue exercise at Home (comment)  walking   Frequency Add 2 additional days to program exercise sessions.   Initial Home Exercises Provided 04/12/17      History  Smoking Status  . Former Smoker  . Quit date: 04/06/2007  Smokeless Tobacco  . Never Used    Goals Met:  Independence with exercise equipment Exercise tolerated well No report of cardiac concerns or symptoms Strength training completed today  Goals Unmet:  Not Applicable  Comments: Pt able to follow exercise prescription today without complaint.  Will continue to monitor for progression.    Dr. Emily Filbert is Medical Director for [place] and [facility].

## 2017-05-22 ENCOUNTER — Encounter: Payer: Medicare Other | Admitting: *Deleted

## 2017-05-22 DIAGNOSIS — I214 Non-ST elevation (NSTEMI) myocardial infarction: Secondary | ICD-10-CM | POA: Diagnosis not present

## 2017-05-22 DIAGNOSIS — Z951 Presence of aortocoronary bypass graft: Secondary | ICD-10-CM

## 2017-05-22 NOTE — Progress Notes (Signed)
Daily Session Note  Patient Details  Name: Thomas Trujillo MRN: [id number] Date of Birth: [birth year] Referring Provider:     Cardiac Rehab from 03/30/2017 in [facility] Cardiac and Pulmonary Rehab  Referring Provider  End      Encounter Date: 05/22/2017  Check In:     Session Check In - 05/22/17 0753      Check-In   Location [facility]-Cardiac & Pulmonary Rehab   Staff Present Gerlene Burdock, RN, Levie Heritage, MA, ACSM RCEP, Exercise Physiologist;Kelly Amedeo Plenty, BS, ACSM CEP, Exercise Physiologist   Supervising physician immediately available to respond to emergencies See telemetry face sheet for immediately available ER MD   Medication changes reported     No   Fall or balance concerns reported    No   Warm-up and Cool-down Performed on first and last piece of equipment   Resistance Training Performed Yes   VAD Patient? No     Pain Assessment   Currently in Pain? No/denies   Multiple Pain Sites No         History  Smoking Status  . Former Smoker  . Quit date: 04/06/2007  Smokeless Tobacco  . Never Used    Goals Met:  Independence with exercise equipment Exercise tolerated well No report of cardiac concerns or symptoms Strength training completed today  Goals Unmet:  Not Applicable  Comments: Pt able to follow exercise prescription today without complaint.  Will continue to monitor for progression.    Dr. Emily Filbert is Medical Director for [place] and [facility].

## 2017-05-24 ENCOUNTER — Encounter: Payer: Medicare Other | Attending: Internal Medicine

## 2017-05-24 DIAGNOSIS — Z951 Presence of aortocoronary bypass graft: Secondary | ICD-10-CM | POA: Insufficient documentation

## 2017-05-24 DIAGNOSIS — Z79899 Other long term (current) drug therapy: Secondary | ICD-10-CM | POA: Insufficient documentation

## 2017-05-24 DIAGNOSIS — J939 Pneumothorax, unspecified: Secondary | ICD-10-CM

## 2017-05-24 DIAGNOSIS — I214 Non-ST elevation (NSTEMI) myocardial infarction: Secondary | ICD-10-CM | POA: Insufficient documentation

## 2017-05-24 HISTORY — DX: Pneumothorax, unspecified: J93.9

## 2017-05-24 NOTE — Progress Notes (Signed)
Daily Session Note  Patient Details  Name: Thomas Trujillo MRN: [id number] Date of Birth: 1946-07-20 Referring Provider:     Cardiac Rehab from 03/30/2017 in [facility] Cardiac and Pulmonary Rehab  Referring Provider  End      Encounter Date: 05/24/2017  Check In:     Session Check In - 05/24/17 0801      Check-In   Location [facility]-Cardiac & Pulmonary Rehab   Staff Present Alberteen Sam, MA, ACSM RCEP, Exercise Physiologist;Susanne Bice, RN, BSN, CCRP;Abree Romick Flavia Shipper   Supervising physician immediately available to respond to emergencies See telemetry face sheet for immediately available ER MD   Medication changes reported     No   Fall or balance concerns reported    No   Warm-up and Cool-down Performed on first and last piece of equipment   Resistance Training Performed Yes   VAD Patient? No     Pain Assessment   Currently in Pain? No/denies   Multiple Pain Sites No         History  Smoking Status  . Former Smoker  . Quit date: 04/06/2007  Smokeless Tobacco  . Never Used    Goals Met:  Independence with exercise equipment Exercise tolerated well No report of cardiac concerns or symptoms Strength training completed today  Goals Unmet:  Not Applicable  Comments: Pt able to follow exercise prescription today without complaint.  Will continue to monitor for progression.      [place] Name 03/30/17 1528 05/24/17 0835       6 Minute Walk   Phase  - Discharge    Distance 810 feet 1370 feet    Distance % Change  - 69.1 %  560 ft    Walk Time 4.5 minutes 6 minutes    # of Rest Breaks _0 sec    MPH 2.04 2.69    METS 2.6 3.32    RPE 17 13    Perceived Dyspnea  3 3    VO2 Peak 9.11 11.61    Symptoms No Yes (comment)    Comments  - SOB, started on Room Air but changed to 2L at 4 min after saturations had dropped to 86%.    Resting HR 106 bpm 95 bpm    Resting BP 102/56 124/60    Max Ex. HR 126 bpm 110 bpm    Max Ex. BP 162/60 154/70     2 Minute Post BP 112/58  -      Interval Oxygen   Interval Oxygen? Yes  -    Baseline Oxygen Saturation % 97 %  -    Baseline Liters of Oxygen 2 L  -    1 Minute Oxygen Saturation % 96 %  -    1 Minute Liters of Oxygen 2 L  -    2 Minute Oxygen Saturation % 94 %  -    2 Minute Liters of Oxygen 2 L  -    3 Minute Liters of Oxygen 2 L  -    4 Minute Oxygen Saturation % 93 %  -    4 Minute Liters of Oxygen 2 L  -    5 Minute Liters of Oxygen 2 L  -    6 Minute Oxygen Saturation % 94 %  -    6 Minute Liters of Oxygen 2 L  -    2 Minute Post Liters of Oxygen 2 L  -  Dr. Emily Filbert is Medical Director for [place] and [facility].

## 2017-05-26 NOTE — Patient Instructions (Signed)
Discharge Instructions  Patient Details  Name: Thomas Trujillo MRN: [id number] Date of Birth: 10-23-1946 Referring Provider:  [name], MD   Number of Visits: 36/36  Reason for Discharge:  Patient reached a stable level of exercise. Patient independent in their exercise.  Smoking History:  History  Smoking Status  . Former Smoker  . Quit date: 04/06/2007  Smokeless Tobacco  . Never Used    Diagnosis:  NSTEMI (non-ST elevated myocardial infarction) ([place])  S/P CABG x 2  Initial Exercise Prescription:     Initial Exercise Prescription - 03/30/17 1500      Date of Initial Exercise RX and Referring Provider   Date 03/30/17   Referring Provider End     Treadmill   MPH 2   Grade 0   Minutes 15   METs 2.53     Recumbant Bike   Level 1   RPM 50   Watts 13   Minutes 15   METs 2.5     NuStep   Level 2   SPM 80   Minutes 15   METs 2.5     Biostep-RELP   Level 2   SPM 50   Minutes 15   METs 2     Prescription Details   Frequency (times per week) 3   Duration Progress to 30 minutes of continuous aerobic without signs/symptoms of physical distress     Intensity   THRR 40-80% of Max Heartrate 123-141   Ratings of Perceived Exertion 11-13   Perceived Dyspnea 0-4     Resistance Training   Training Prescription Yes   Weight 3   Reps 10-15      Discharge Exercise Prescription (Final Exercise Prescription Changes):     Exercise Prescription Changes - 05/18/17 1100      Response to Exercise   Blood Pressure (Admit) 110/58   Blood Pressure (Exercise) 132/60   Blood Pressure (Exit) 126/70   Heart Rate (Admit) 96 bpm   Heart Rate (Exercise) 105 bpm   Heart Rate (Exit) 77 bpm   Rating of Perceived Exertion (Exercise) 12   Symptoms none   Duration Continue with 45 min of aerobic exercise without signs/symptoms of physical distress.   Intensity THRR unchanged     Progression   Progression Continue to progress workloads to maintain intensity  without signs/symptoms of physical distress.   Average METs 2.98     Resistance Training   Training Prescription Yes   Weight 3 lb   Reps 10-15     Interval Training   Interval Training No     Oxygen   Oxygen Continuous  only on treadmill   Liters 2     Treadmill   MPH 2   Grade 1   Minutes 15   METs 2.81     Recumbant Bike   Level 4   Watts 27   Minutes 15   METs 3.35     NuStep   Level 4   Minutes 15   METs 2.8     Home Exercise Plan   Plans to continue exercise at Home (comment)  walking   Frequency Add 2 additional days to program exercise sessions.   Initial Home Exercises Provided 04/12/17      Functional Capacity:     6 Minute Walk    Row Name 03/30/17 1528 05/24/17 0835 05/24/17 0839     6 Minute Walk   Phase  - Discharge  -   Distance 810 feet 1370 feet  -  Distance % Change  - 69.1 %  560 ft  -   Walk Time 4.5 minutes 6 minutes  -   # of Rest Breaks 2 1  17  sec  -   MPH 2.04 2.69  -   METS 2.6 3.32  -   RPE 17 13  -   Perceived Dyspnea  3 3  -   VO2 Peak 9.11 11.61  -   Symptoms No Yes (comment)  -   Comments  - SOB, started on Room Air but changed to 2L at 4 min after saturations had dropped to 86%.  -   Resting HR 106 bpm 95 bpm  -   Resting BP 102/56 124/60  -   Max Ex. HR 126 bpm 110 bpm  -   Max Ex. BP 162/60 154/70  -   2 Minute Post BP 112/58  -  -     Interval Oxygen   Interval Oxygen? Yes  -  -   Baseline Oxygen Saturation % 97 %  - 98 %   Baseline Liters of Oxygen 2 L  - 0 L  Room Air   1 Minute Oxygen Saturation % 96 %  -  -   1 Minute Liters of Oxygen 2 L  -  -   2 Minute Oxygen Saturation % 94 %  -  -   2 Minute Liters of Oxygen 2 L  -  -   3 Minute Liters of Oxygen 2 L  -  -   4 Minute Oxygen Saturation % 93 %  - 86 %   4 Minute Liters of Oxygen 2 L  - 0 L   5 Minute Liters of Oxygen 2 L  -  -   6 Minute Oxygen Saturation % 94 %  - 92 %   6 Minute Liters of Oxygen 2 L  - 2 L   2 Minute Post Liters of Oxygen 2  L  -  -      Quality of Life:     Quality of Life - 05/22/17 0851      Quality of Life Scores   Health/Function Pre 23.46 %   Health/Function Post 24.46 %   Health/Function % Change 4.26 %   Socioeconomic Pre 29 %   Socioeconomic Post 27.5 %   Socioeconomic % Change  -5.17 %   Psych/Spiritual Pre 30 %   Psych/Spiritual Post 28.29 %   Psych/Spiritual % Change -5.7 %   Family Pre 28.5 %   Family Post 28.5 %   Family % Change 0 %   GLOBAL Pre 26.58 %   GLOBAL Post 26.5 %   GLOBAL % Change -0.3 %      Personal Goals: Goals established at orientation with interventions provided to work toward goal.     Personal Goals and Risk Factors at Admission - 03/30/17 1546      Core Components/Risk Factors/Patient Goals on Admission   Lipids Yes   Intervention Provide education and support for participant on nutrition & aerobic/resistive exercise along with prescribed medications to achieve LDL 70mg , HDL >40mg .   Expected Outcomes Short Term: Participant states understanding of desired cholesterol values and is compliant with medications prescribed. Participant is following exercise prescription and nutrition guidelines.;Long Term: Cholesterol controlled with medications as prescribed, with individualized exercise RX and with personalized nutrition plan. Value goals: LDL < 70mg , HDL > 40 mg.       Personal Goals Discharge:  Goals and Risk Factor Review - 05/17/17 0856      Core Components/Risk Factors/Patient Goals Review   Personal Goals Review Lipids;Weight Management/Obesity;Improve shortness of breath with ADL's   Review Thomas Trujillo has not had any problems with his medications.  He is able to do more at home now without getting as short of breath.  He is trying to come off of his oxygen some.  His weight has been steady.   Expected Outcomes Short: Continue to work on improving his SOB with activities.  Long: Continue to work on risk factor modifications.      Nutrition & Weight  - Outcomes:     Pre Biometrics - 03/30/17 1527      Pre Biometrics   Height 5\' 4"  (1.626 m)   Weight 152 lb 1.6 oz (69 kg)   Waist Circumference 37.25 inches   Hip Circumference 39.5 inches   Waist to Hip Ratio 0.94 %   BMI (Calculated) 26.2   Single Leg Stand 30 seconds       Nutrition:     Nutrition Therapy & Goals - 04/17/17 1058      Nutrition Therapy   Diet basic heart healthy   Drug/Food Interactions Statins/Certain Fruits   Sodium 1500 grams     Personal Nutrition Goals   Nutrition Goal continue with current heart healthy eating pattern   Personal Goal #2 Use menus provided to help plan balanced meals when no longer receiving chef services   Comments 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 He is controlling sodium intake and limiting unhealthy fats while including generous vegetable and fruit portions. He chooses whole grain foods and eats beans regularly.      Intervention Plan   Intervention Prescribe, educate and counsel regarding individualized specific dietary modifications aiming towards targeted core components such as weight, hypertension, lipid management, diabetes, heart failure and other comorbidities.;Nutrition handout(s) given to patient.   Expected Outcomes Short Term Goal: A plan has been developed with personal nutrition goals set during dietitian appointment.;Long Term Goal: Adherence to prescribed nutrition plan.      Nutrition Discharge:     Nutrition Assessments - 05/22/17 0851      MEDFICTS Scores   Pre Score 6   Post Score 6   Score Difference 0      Education Questionnaire Score:     Knowledge Questionnaire Score - 05/22/17 0851      Knowledge Questionnaire Score   Post Score 28/28      Goals reviewed with patient; copy given to patient.

## 2017-05-29 ENCOUNTER — Encounter: Payer: Medicare Other | Admitting: *Deleted

## 2017-05-29 VITALS — Ht 64.0 in | Wt 152.5 lb

## 2017-05-29 DIAGNOSIS — I214 Non-ST elevation (NSTEMI) myocardial infarction: Secondary | ICD-10-CM

## 2017-05-29 DIAGNOSIS — Z951 Presence of aortocoronary bypass graft: Secondary | ICD-10-CM

## 2017-05-29 NOTE — Progress Notes (Signed)
Daily Session Note  Patient Details  Name: Thomas Trujillo MRN: [id number] Date of Birth: 1946/10/17 Referring Provider:     Cardiac Rehab from 03/30/2017 in [facility] Cardiac and Pulmonary Rehab  Referring Provider  End      Encounter Date: 05/29/2017  Check In:     Session Check In - 05/29/17 0757      Check-In   Location [facility]-Cardiac & Pulmonary Rehab   Staff Present Gerlene Burdock, RN, Levie Heritage, MA, ACSM RCEP, Exercise Physiologist;Kelly Amedeo Plenty, BS, ACSM CEP, Exercise Physiologist   Supervising physician immediately available to respond to emergencies See telemetry face sheet for immediately available ER MD   Medication changes reported     No   Fall or balance concerns reported    No   Warm-up and Cool-down Performed on first and last piece of equipment   Resistance Training Performed Yes   VAD Patient? No     Pain Assessment   Currently in Pain? No/denies   Multiple Pain Sites No         History  Smoking Status  . Former Smoker  . Quit date: 04/06/2007  Smokeless Tobacco  . Never Used    Goals Met:  Independence with exercise equipment Exercise tolerated well Personal goals reviewed No report of cardiac concerns or symptoms Strength training completed today  Goals Unmet:  Not Applicable  Comments:  Thomas Trujillo graduated today from cardiac rehab with 36 sessions completed.  Details of the patient's exercise prescription and what He needs to do in order to continue the prescription and progress were discussed with patient.  Patient was given a copy of prescription and goals.  Patient verbalized understanding.  Thomas Trujillo plans to continue to exercise by walking at home.    Dr. Emily Filbert is Medical Director for [facility].

## 2017-05-29 NOTE — Progress Notes (Signed)
Discharge Summary  Patient Details  Name: Thomas Trujillo MRN: [id number] Date of Birth: February 19, 1946 Referring Provider:     Cardiac Rehab from 03/30/2017 in [facility] Cardiac and Pulmonary Rehab  Referring Provider  End       Number of Visits: 36/36  Reason for Discharge:  Patient reached a stable level of exercise. Patient independent in their exercise.  Smoking History:  History  Smoking Status  . Former Smoker  . Quit date: 04/06/2007  Smokeless Tobacco  . Never Used    Diagnosis:  NSTEMI (non-ST elevated myocardial infarction) ([place])  S/P CABG x 2  ADL UCSD:   Initial Exercise Prescription:     Initial Exercise Prescription - 03/30/17 1500      Date of Initial Exercise RX and Referring Provider   Date 03/30/17   Referring Provider End     Treadmill   MPH 2   Grade 0   Minutes 15   METs 2.53     Recumbant Bike   Level 1   RPM 50   Watts 13   Minutes 15   METs 2.5     NuStep   Level 2   SPM 80   Minutes 15   METs 2.5     Biostep-RELP   Level 2   SPM 50   Minutes 15   METs 2     Prescription Details   Frequency (times per week) 3   Duration Progress to 30 minutes of continuous aerobic without signs/symptoms of physical distress     Intensity   THRR 40-80% of Max Heartrate 123-141   Ratings of Perceived Exertion 11-13   Perceived Dyspnea 0-4     Resistance Training   Training Prescription Yes   Weight 3   Reps 10-15      Discharge Exercise Prescription (Final Exercise Prescription Changes):     Exercise Prescription Changes - 05/18/17 1100      Response to Exercise   Blood Pressure (Admit) 110/58   Blood Pressure (Exercise) 132/60   Blood Pressure (Exit) 126/70   Heart Rate (Admit) 96 bpm   Heart Rate (Exercise) 105 bpm   Heart Rate (Exit) 77 bpm   Rating of Perceived Exertion (Exercise) 12   Symptoms none   Duration Continue with 45 min of aerobic exercise without signs/symptoms of physical distress.   Intensity THRR unchanged      Progression   Progression Continue to progress workloads to maintain intensity without signs/symptoms of physical distress.   Average METs 2.98     Resistance Training   Training Prescription Yes   Weight 3 lb   Reps 10-15     Interval Training   Interval Training No     Oxygen   Oxygen Continuous  only on treadmill   Liters 2     Treadmill   MPH 2   Grade 1   Minutes 15   METs 2.81     Recumbant Bike   Level 4   Watts 27   Minutes 15   METs 3.35     NuStep   Level 4   Minutes 15   METs 2.8     Home Exercise Plan   Plans to continue exercise at Home (comment)  walking   Frequency Add 2 additional days to program exercise sessions.   Initial Home Exercises Provided 04/12/17      Functional Capacity:     6 Minute Walk    Row Name 03/30/17 1528 05/24/17 0835 05/24/17 7829  6 Minute Walk   Phase  - Discharge  -   Distance 810 feet 1370 feet  -   Distance % Change  - 69.1 %  560 ft  -   Walk Time 4.5 minutes 6 minutes  -   # of Rest Breaks 2 1  17  sec  -   MPH 2.04 2.69  -   METS 2.6 3.32  -   RPE 17 13  -   Perceived Dyspnea  3 3  -   VO2 Peak 9.11 11.61  -   Symptoms No Yes (comment)  -   Comments  - SOB, started on Room Air but changed to 2L at 4 min after saturations had dropped to 86%.  -   Resting HR 106 bpm 95 bpm  -   Resting BP 102/56 124/60  -   Max Ex. HR 126 bpm 110 bpm  -   Max Ex. BP 162/60 154/70  -   2 Minute Post BP 112/58  -  -     Interval Oxygen   Interval Oxygen? Yes  -  -   Baseline Oxygen Saturation % 97 %  - 98 %   Baseline Liters of Oxygen 2 L  - 0 L  Room Air   1 Minute Oxygen Saturation % 96 %  -  -   1 Minute Liters of Oxygen 2 L  -  -   2 Minute Oxygen Saturation % 94 %  -  -   2 Minute Liters of Oxygen 2 L  -  -   3 Minute Liters of Oxygen 2 L  -  -   4 Minute Oxygen Saturation % 93 %  - 86 %   4 Minute Liters of Oxygen 2 L  - 0 L   5 Minute Liters of Oxygen 2 L  -  -   6 Minute Oxygen Saturation % 94  %  - 92 %   6 Minute Liters of Oxygen 2 L  - 2 L   2 Minute Post Liters of Oxygen 2 L  -  -      Psychological, QOL, Others - Outcomes: PHQ 2/9: Depression screen [facility] 2/9 05/22/2017 03/30/2017  Decreased Interest 0 2  Down, Depressed, Hopeless 0 0  PHQ - 2 Score 0 2  Altered sleeping 0 3  Tired, decreased energy 0 3  Change in appetite 0 1  Feeling bad or failure about yourself  0 0  Trouble concentrating 0 0  Moving slowly or fidgety/restless 0 0  Suicidal thoughts 0 0  PHQ-9 Score 0 9  Difficult doing work/chores Not difficult at all Not difficult at all    Quality of Life:     Quality of Life - 05/22/17 0851      Quality of Life Scores   Health/Function Pre 23.46 %   Health/Function Post 24.46 %   Health/Function % Change 4.26 %   Socioeconomic Pre 29 %   Socioeconomic Post 27.5 %   Socioeconomic % Change  -5.17 %   Psych/Spiritual Pre 30 %   Psych/Spiritual Post 28.29 %   Psych/Spiritual % Change -5.7 %   Family Pre 28.5 %   Family Post 28.5 %   Family % Change 0 %   GLOBAL Pre 26.58 %   GLOBAL Post 26.5 %   GLOBAL % Change -0.3 %      Personal Goals: Goals established at orientation with interventions provided to work toward goal.  Personal Goals and Risk Factors at Admission - 03/30/17 1546      Core Components/Risk Factors/Patient Goals on Admission   Lipids Yes   Intervention Provide education and support for participant on nutrition & aerobic/resistive exercise along with prescribed medications to achieve LDL 70mg , HDL >40mg .   Expected Outcomes Short Term: Participant states understanding of desired cholesterol values and is compliant with medications prescribed. Participant is following exercise prescription and nutrition guidelines.;Long Term: Cholesterol controlled with medications as prescribed, with individualized exercise RX and with personalized nutrition plan. Value goals: LDL < 70mg , HDL > 40 mg.       Personal Goals Discharge:      Goals and Risk Factor Review    Row Name 04/21/17 0849 05/17/17 0856           Core Components/Risk Factors/Patient Goals Review   Personal Goals Review Lipids Lipids;Weight Management/Obesity;Improve shortness of breath with ADL's      Review Nethan reports that his "personal chef has all the heart healthy rules and diets and he cooks all my lunches and dinners for me". Ranen reports that he makes his own breakfast and eats healty. [place]eton gets his lipids checked by his MD.  Sheron has not had any problems with his medications.  He is able to do more at home now without getting as short of breath.  He is trying to come off of his oxygen some.  His weight has been steady.      Expected Outcomes Heart healthy living will using 2 liters of oxgyen currently since his surgeyr. Short: Continue to work on improving his SOB with activities.  Long: Continue to work on risk factor modifications.         Nutrition & Weight - Outcomes:     Pre Biometrics - 03/30/17 1527      Pre Biometrics   Height 5\' 4"  (1.626 m)   Weight 152 lb 1.6 oz (69 kg)   Waist Circumference 37.25 inches   Hip Circumference 39.5 inches   Waist to Hip Ratio 0.94 %   BMI (Calculated) 26.2   Single Leg Stand 30 seconds         Post Biometrics - 05/29/17 0835       Post  Biometrics   Height 5\' 4"  (1.626 m)   Weight 152 lb 8 oz (69.2 kg)   Waist Circumference 34 inches   Hip Circumference 36 inches   Waist to Hip Ratio 0.94 %   BMI (Calculated) 26.2   Single Leg Stand 30 seconds      Nutrition:     Nutrition Therapy & Goals - 04/17/17 1058      Nutrition Therapy   Diet basic heart healthy   Drug/Food Interactions Statins/Certain Fruits   Sodium 1500 grams     Personal Nutrition Goals   Nutrition Goal continue with current heart healthy eating pattern   Personal Goal #2 Use menus provided to help plan balanced meals when no longer receiving chef services   Comments 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 He is controlling sodium intake and limiting unhealthy fats while including generous vegetable and fruit portions. He chooses whole grain foods and eats beans regularly.      Intervention Plan   Intervention Prescribe, educate and counsel regarding individualized specific dietary modifications aiming towards targeted core components such as weight, hypertension, lipid management, diabetes, heart failure and other comorbidities.;Nutrition handout(s) given to patient.  Expected Outcomes Short Term Goal: A plan has been developed with personal nutrition goals set during dietitian appointment.;Long Term Goal: Adherence to prescribed nutrition plan.      Nutrition Discharge:     Nutrition Assessments - 05/22/17 0851      MEDFICTS Scores   Pre Score 6   Post Score 6   Score Difference 0      Education Questionnaire Score:     Knowledge Questionnaire Score - 05/22/17 0851      Knowledge Questionnaire Score   Post Score 28/28      Goals reviewed with patient; copy given to patient.

## 2017-05-29 NOTE — Progress Notes (Signed)
Cardiac Individual Treatment Plan  Patient Details  Name: Thomas Trujillo MRN: [id number] Date of Birth: 1946-08-24 Referring Provider:     Cardiac Rehab from 03/30/2017 in [facility] Cardiac and Pulmonary Rehab  Referring Provider  End      Initial Encounter Date:    Cardiac Rehab from 03/30/2017 in [facility] Cardiac and Pulmonary Rehab  Date  03/30/17  Referring Provider  End      Visit Diagnosis: NSTEMI (non-ST elevated myocardial infarction) (Thomas Trujillo)  S/P CABG x 2  Patient's Home Medications on Admission:  Current Outpatient Prescriptions:  .  acetaminophen (TYLENOL) 500 MG tablet, Take 500 mg by mouth every 6 (six) hours as needed., Disp: , Rfl:  .  aspirin 325 MG EC tablet, Take 1 tablet (325 mg total) by mouth daily., Disp: 30 tablet, Rfl: 0 .  atorvastatin (LIPITOR) 40 MG tablet, Take 1 tablet (40 mg total) by mouth daily., Disp: 90 tablet, Rfl: 3 .  bisacodyl (DULCOLAX) 5 MG EC tablet, Take 2 tablets (10 mg total) by mouth daily as needed for moderate constipation (Give daily if no BM)., Disp: 30 tablet, Rfl: 0 .  ferrous sulfate 325 (65 FE) MG tablet, Take 325 mg by mouth daily with breakfast., Disp: , Rfl:  .  furosemide (LASIX) 20 MG tablet, TAKE 1 TABLET BY MOUTH EVERY DAY, Disp: 5 tablet, Rfl: 0 .  furosemide (LASIX) 20 MG tablet, Take 1-2 tablets (20-40 mg total) by mouth as directed. Once daily with additional dose if he gains 2 pounds in a day or 5 pounds in a week., Disp: 60 tablet, Rfl: 3 .  ipratropium-albuterol (DUONEB) 0.5-2.5 (3) MG/3ML SOLN, Take 3 mLs by nebulization every 6 (six) hours as needed. When awake , Disp: , Rfl:  .  KLOR-CON M10 10 MEQ tablet, TAKE 1 TABLET BY MOUTH EVERY DAY, Disp: 5 tablet, Rfl: 0 .  metoprolol tartrate (LOPRESSOR) 25 MG tablet, Take 12.5 mg by mouth 2 (two) times daily. Hold for SBP< 110 or HR <60, Disp: , Rfl:  .  OXYGEN, Inhale into the lungs. By nasal cannula and wean as tolerated to keep O2 sats >90%, Disp: , Rfl:  .  potassium chloride  (K-DUR) 10 MEQ tablet, Take 10 mEq by mouth daily., Disp: , Rfl:  .  tamsulosin (FLOMAX) 0.4 MG CAPS capsule, Take 1 capsule (0.4 mg total) by mouth daily., Disp: 30 capsule, Rfl: 1  Past Medical History: Past Medical History:  Diagnosis Date  . Bleb, lung ([place])   . Coronary artery disease 02/14/2017   NSTEMI with urgent CABG (LIMA-LAD and SVG->ramus)  . Hyperlipidemia   . Ischemic cardiomyopathy   . Patient denies medical problems     Tobacco Use: History  Smoking Status  . Former Smoker  . Quit date: 04/06/2007  Smokeless Tobacco  . Never Used    Labs: Recent Review Flowsheet Data    Labs for ITP Cardiac and Pulmonary Rehab Latest Ref Rng & Units 02/15/2017 02/15/2017 02/15/2017 02/18/2017 02/18/2017   Cholestrol 0 - 200 mg/dL - - - - -   LDLCALC 0 - 99 mg/dL - - - - -   HDL >40 mg/dL - - - - -   Trlycerides <150 mg/dL - - - - -   Hemoglobin A1c 4.8 - 5.6 % - - - - -   PHART 7.350 - 7.450 7.316(L) 7.344(L) - 7.393 -   PCO2ART 32.0 - 48.0 mmHg 46.8 42.7 - 50.1(H) -   HCO3 20.0 - 28.0 mmol/L 23.7 23.3 -  30.5(H) -   TCO2 0 - 100 mmol/L _0 32 30   ACIDBASEDEF 0.0 - 2.0 mmol/L 2.0 2.0 - - -   O2SAT % 96.0 96.0 - 98.0 -       Exercise Target Goals:    Exercise Program Goal: Individual exercise prescription set with THRR, safety & activity barriers. Participant demonstrates ability to understand and report RPE using BORG scale, to self-measure pulse accurately, and to acknowledge the importance of the exercise prescription.  Exercise Prescription Goal: Starting with aerobic activity 30 plus minutes a day, 3 days per week for initial exercise prescription. Provide home exercise prescription and guidelines that participant acknowledges understanding prior to discharge.  Activity Barriers & Risk Stratification:   6 Minute Walk:     6 Minute Walk    Row Name 03/30/17 1528 05/24/17 0835 05/24/17 0839     6 Minute Walk   Phase  - Discharge  -   Distance 810 feet 1370  feet  -   Distance % Change  - 69.1 %  560 ft  -   Walk Time 4.5 minutes 6 minutes  -   # of Rest Breaks _1 sec  -   MPH 2.04 2.69  -   METS 2.6 3.32  -   RPE 17 13  -   Perceived Dyspnea  3 3  -   VO2 Peak 9.11 11.61  -   Symptoms No Yes (comment)  -   Comments  - SOB, started on Room Air but changed to 2L at 4 min after saturations had dropped to 86%.  -   Resting HR 106 bpm 95 bpm  -   Resting BP 102/56 124/60  -   Max Ex. HR 126 bpm 110 bpm  -   Max Ex. BP 162/60 154/70  -   2 Minute Post BP 112/58  -  -     Interval Oxygen   Interval Oxygen? Yes  -  -   Baseline Oxygen Saturation % 97 %  - 98 %   Baseline Liters of Oxygen 2 L  - 0 L  Room Air   1 Minute Oxygen Saturation % 96 %  -  -   1 Minute Liters of Oxygen 2 L  -  -   2 Minute Oxygen Saturation % 94 %  -  -   2 Minute Liters of Oxygen 2 L  -  -   3 Minute Liters of Oxygen 2 L  -  -   4 Minute Oxygen Saturation % 93 %  - 86 %   4 Minute Liters of Oxygen 2 L  - 0 L   5 Minute Liters of Oxygen 2 L  -  -   6 Minute Oxygen Saturation % 94 %  - 92 %   6 Minute Liters of Oxygen 2 L  - 2 L   2 Minute Post Liters of Oxygen 2 L  -  -      Oxygen Initial Assessment:     Oxygen Initial Assessment - 03/30/17 1540      Initial 6 min Walk   Resting Oxygen Saturation  during 6 min walk 96 %   Exercise Oxygen Saturation  during 6 min walk 94 %     Program Oxygen Prescription   Program Oxygen Prescription Continuous   Liters per minute 2   Comments Thomas Trujillo has been told he does not need oxygen.  Sat dropped  to 85% when he started walk test.  PLaced oxygen on at 2l     Intervention   Short Term Goals To learn and exhibit compliance with exercise, home and travel O2 prescription;To learn and understand importance of monitoring SPO2 with pulse oximeter and demonstrate accurate use of the pulse oximeter.;To Learn and understand importance of maintaining oxygen saturations>88%   Long  Term Goals Exhibits compliance with  exercise, home and travel O2 prescription;Verbalizes importance of monitoring SPO2 with pulse oximeter and return demonstration;Maintenance of O2 saturations>88%;Exhibits proper breathing techniques, such as purse lipped breathing or other method taught during program session      Oxygen Re-Evaluation:     Oxygen Re-Evaluation    Row Name 04/07/17 0856 04/21/17 0841 05/17/17 0901         Program Oxygen Prescription   Program Oxygen Prescription Continuous Continuous Continuous  on treadmill mainly, trying to come off for seated equipment     Liters per minute 2  - 2     Comments  - Johncharles arrives on 2 liters of oxgyen that he reports he has had since surgery this year. [place] said he didn't use his oxgyen to sleep last night. He reported that he shut off his oxgyen for a few minutes while riding the bicycle in Cardiac Rehab today.  -       Home Oxygen   Home Oxygen Device Home Concentrator;E-Tanks  - Home Concentrator;E-Tanks     Sleep Oxygen Prescription None  - None     Home Exercise Oxygen Prescription Continuous Continuous Continuous  only with activity     Liters per minute _0 Home at Rest Exercise Oxygen Prescription None  - None     Compliance with Home Oxygen Use Yes Yes Yes       Goals/Expected Outcomes   Short Term Goals  - To learn and exhibit compliance with exercise, home and travel O2 prescription;To learn and understand importance of monitoring SPO2 with pulse oximeter and demonstrate accurate use of the pulse oximeter. To learn and exhibit compliance with exercise, home and travel O2 prescription;To learn and understand importance of monitoring SPO2 with pulse oximeter and demonstrate accurate use of the pulse oximeter.;To Learn and understand importance of maintaining oxygen saturations>88%     Long  Term Goals  -  - Exhibits compliance with exercise, home and travel O2 prescription;Verbalizes importance of monitoring SPO2 with pulse oximeter and return  demonstration;Maintenance of O2 saturations>88%;Exhibits proper breathing techniques, such as purse lipped breathing or other method taught during program session     Comments Dinero used our oxgyen tank on 2liters/minute during Cardiac Rehab then switched to his home oxgyen when he left to go home.   - Rykker is trying to use less oxygen for exercise. He really wants to come off his oxygen altogether.  He is no longer using it at rest and just for exertion, but it is starting to get better.  His oxygen saturations have been good on room air.      Goals/Expected Outcomes  -  - Short: Start to exercise more without his oxygen and reduce amount used on treadmill.  Long: Come off oxygen completely.        Oxygen Discharge (Final Oxygen Re-Evaluation):     Oxygen Re-Evaluation - 05/17/17 0901      Program Oxygen Prescription   Program Oxygen Prescription Continuous  on treadmill mainly, trying to come off for seated equipment   Liters per  minute 2     Home Oxygen   Home Oxygen Device Home Concentrator;E-Tanks   Sleep Oxygen Prescription None   Home Exercise Oxygen Prescription Continuous  only with activity   Liters per minute 2   Home at Rest Exercise Oxygen Prescription None   Compliance with Home Oxygen Use Yes     Goals/Expected Outcomes   Short Term Goals To learn and exhibit compliance with exercise, home and travel O2 prescription;To learn and understand importance of monitoring SPO2 with pulse oximeter and demonstrate accurate use of the pulse oximeter.;To Learn and understand importance of maintaining oxygen saturations>88%   Long  Term Goals Exhibits compliance with exercise, home and travel O2 prescription;Verbalizes importance of monitoring SPO2 with pulse oximeter and return demonstration;Maintenance of O2 saturations>88%;Exhibits proper breathing techniques, such as purse lipped breathing or other method taught during program session   Comments Saw is trying to use less oxygen  for exercise. He really wants to come off his oxygen altogether.  He is no longer using it at rest and just for exertion, but it is starting to get better.  His oxygen saturations have been good on room air.    Goals/Expected Outcomes Short: Start to exercise more without his oxygen and reduce amount used on treadmill.  Long: Come off oxygen completely.      Initial Exercise Prescription:     Initial Exercise Prescription - 03/30/17 1500      Date of Initial Exercise RX and Referring Provider   Date 03/30/17   Referring Provider End     Treadmill   MPH 2   Grade 0   Minutes 15   METs 2.53     Recumbant Bike   Level 1   RPM 50   Watts 13   Minutes 15   METs 2.5     NuStep   Level 2   SPM 80   Minutes 15   METs 2.5     Biostep-RELP   Level 2   SPM 50   Minutes 15   METs 2     Prescription Details   Frequency (times per week) 3   Duration Progress to 30 minutes of continuous aerobic without signs/symptoms of physical distress     Intensity   THRR 40-80% of Max Heartrate 123-141   Ratings of Perceived Exertion 11-13   Perceived Dyspnea 0-4     Resistance Training   Training Prescription Yes   Weight 3   Reps 10-15      Perform Capillary Blood Glucose checks as needed.  Exercise Prescription Changes:     Exercise Prescription Changes    Row Name 03/30/17 1500 04/05/17 1600 04/12/17 0900 04/18/17 1200 05/02/17 1500     Response to Exercise   Blood Pressure (Admit) 102/56 122/50  - 108/56 130/70   Blood Pressure (Exercise) 162/60 116/50  - 138/78 136/72   Blood Pressure (Exit) 112/58 118/64  - 138/70 122/70   Heart Rate (Admit) 109 bpm 102 bpm  - 90 bpm 80 bpm   Heart Rate (Exercise) 129 bpm 116 bpm  - 117 bpm 115 bpm   Heart Rate (Exit) 111 bpm 102 bpm  - 84 bpm 103 bpm   Oxygen Saturation (Admit) 96 % 97 %  -  -  -   Oxygen Saturation (Exercise) 94 %  -  -  -  -   Rating of Perceived Exertion (Exercise)  - 12  - 13 13   Symptoms  - none none none  none   Duration  - Progress to 45 minutes of aerobic exercise without signs/symptoms of physical distress Progress to 45 minutes of aerobic exercise without signs/symptoms of physical distress Continue with 45 min of aerobic exercise without signs/symptoms of physical distress. Continue with 45 min of aerobic exercise without signs/symptoms of physical distress.   Intensity  - THRR unchanged THRR unchanged THRR unchanged THRR unchanged     Progression   Progression  - Continue to progress workloads to maintain intensity without signs/symptoms of physical distress. Continue to progress workloads to maintain intensity without signs/symptoms of physical distress. Continue to progress workloads to maintain intensity without signs/symptoms of physical distress. Continue to progress workloads to maintain intensity without signs/symptoms of physical distress.   Average METs  - 2.5 2.5 2.55 2.6     Resistance Training   Training Prescription  - Yes Yes Yes Yes   Weight  - 3 lbs 3 lbs 3 lbs 3 lb   Reps  - 10-15 10-15 10-15 10-15     Interval Training   Interval Training  - No No No No     Oxygen   Oxygen  -  -  -  - Continuous   Liters  -  -  -  - 2     Treadmill   MPH  -  -  - 2  -   Grade  -  -  - 0.5  -   Minutes  -  -  - 15  -   METs  -  -  - 2.67  -     Recumbant Bike   Level  - _0 Watts  - _1 Minutes  - _2 METs  - 2.59 2.59 2.67 2.59     NuStep   Level  - _3 Minutes  - _4 METs  - 2.5 2.5 2.3 2.6     Home Exercise Plan   Plans to continue exercise at  -  - Home (comment)  walking Home (comment)  walking  -   Frequency  -  - Add 2 additional days to program exercise sessions. Add 2 additional days to program exercise sessions.  -   Initial Home Exercises Provided  -  - 04/12/17 04/12/17  -   [place] Name 05/18/17 1100             Response to Exercise   Blood Pressure (Admit) 110/58       Blood Pressure (Exercise) 132/60        Blood Pressure (Exit) 126/70       Heart Rate (Admit) 96 bpm       Heart Rate (Exercise) 105 bpm       Heart Rate (Exit) 77 bpm       Rating of Perceived Exertion (Exercise) 12       Symptoms none       Duration Continue with 45 min of aerobic exercise without signs/symptoms of physical distress.       Intensity THRR unchanged         Progression   Progression Continue to progress workloads to maintain intensity without signs/symptoms of physical distress.       Average METs 2.98         Resistance Training   Training Prescription Yes       Weight 3 lb  Reps 10-15         Interval Training   Interval Training No         Oxygen   Oxygen Continuous  only on treadmill       Liters 2         Treadmill   MPH 2       Grade 1       Minutes 15       METs 2.81         Recumbant Bike   Level 4       Watts 27       Minutes 15       METs 3.35         NuStep   Level 4       Minutes 15       METs 2.8         Home Exercise Plan   Plans to continue exercise at Home (comment)  walking       Frequency Add 2 additional days to program exercise sessions.       Initial Home Exercises Provided 04/12/17          Exercise Comments:     Exercise Comments    Row Name 04/03/17 0912 04/05/17 0956 04/19/17 0810 05/29/17 0758     Exercise Comments First full day of exercise!  Patient was oriented to gym and equipment including functions, settings, policies, and procedures.  Patient's individual exercise prescription and treatment plan were reviewed.  All starting workloads were established based on the results of the 6 minute walk test done at initial orientation visit.  The plan for exercise progression was also introduced and progression will be customized based on patient's performance and goals [place] completed an abbreviated exercise session due to  Dr appointment. Reviewed METs average and discussed progression with pt today.  West graduated today from cardiac rehab with 36  sessions completed.  Details of the patient's exercise prescription and what He needs to do in order to continue the prescription and progress were discussed with patient.  Patient was given a copy of prescription and goals.  Patient verbalized understanding.  Jarryn plans to continue to exercise by walking at home.       Exercise Goals and Review:     Exercise Goals    Row Name 03/30/17 1528             Exercise Goals   Increase Physical Activity Yes       Intervention Provide advice, education, support and counseling about physical activity/exercise needs.;Develop an individualized exercise prescription for aerobic and resistive training based on initial evaluation findings, risk stratification, comorbidities and participant's personal goals.       Expected Outcomes Achievement of increased cardiorespiratory fitness and enhanced flexibility, muscular endurance and strength shown through measurements of functional capacity and personal statement of participant.       Increase Strength and Stamina Yes       Intervention Provide advice, education, support and counseling about physical activity/exercise needs.;Develop an individualized exercise prescription for aerobic and resistive training based on initial evaluation findings, risk stratification, comorbidities and participant's personal goals.       Expected Outcomes Achievement of increased cardiorespiratory fitness and enhanced flexibility, muscular endurance and strength shown through measurements of functional capacity and personal statement of participant.          Exercise Goals Re-Evaluation :     Exercise Goals Re-Evaluation    [place] Name 04/05/17  1600 04/12/17 0934 04/18/17 1157 05/02/17 1549 05/17/17 0851     Exercise Goal Re-Evaluation   Exercise Goals Review Increase Physical Activity;Increase Strenth and Stamina Increase Physical Activity;Increase Strenth and Stamina Increase Physical Activity;Increase Strenth and Stamina  Increase Physical Activity;Increase Strenth and Stamina Increase Physical Activity;Increase Strenth and Stamina   Comments Saheed has completed two exercise days with rehab so far.  He is off to good a start.  We will continue to monitor his progression. Reviewed home exercise with pt today.  Pt plans to walk at home for exercise.  Reviewed THR, pulse, RPE, sign and symptoms, and when to call 911 or MD.  Also discussed weather considerations and indoor options.  Pt voiced understanding. Cason is doing well in rehab.  He is now up to 2.0 mph on the treadmill.  We will continue to monitor his progression. Justinian has increased his resistnace on the NuStep.  Staff will continue to monitor progression. Layton has been doing well in rehab.  He has more strength and stamina and is able to do more at home.  He has also come off his oxygen except for exercise.  Today, he tried the stepper on RA and mainated 88-90%.  The treadmill is still his hardest piece with the weight bearing.   Expected Outcomes Continue to come to classes to work on strength and stamina. Short: Add in at least one day at home.  Long: Make exercise part of routine Short: Increase work load on NuStep.  Long: Continue to exercise regularly. Short - Continue regular attendance Long - Continue to improve overall functional fitness. Short: Continue to exercise at home and try to exercise off oxygen.  Long: Continue to exercise independently.   [place] Name 05/24/17 0836             Exercise Goal Re-Evaluation   Exercise Goals Review Increase Physical Activity;Increase Strenth and Stamina       Comments Sakai improved his walk test by 560 ft!         Expected Outcomes Short: Graduate  Long: Continue to exercise indpendent          Discharge Exercise Prescription (Final Exercise Prescription Changes):     Exercise Prescription Changes - 05/18/17 1100      Response to Exercise   Blood Pressure (Admit) 110/58   Blood Pressure (Exercise) 132/60    Blood Pressure (Exit) 126/70   Heart Rate (Admit) 96 bpm   Heart Rate (Exercise) 105 bpm   Heart Rate (Exit) 77 bpm   Rating of Perceived Exertion (Exercise) 12   Symptoms none   Duration Continue with 45 min of aerobic exercise without signs/symptoms of physical distress.   Intensity THRR unchanged     Progression   Progression Continue to progress workloads to maintain intensity without signs/symptoms of physical distress.   Average METs 2.98     Resistance Training   Training Prescription Yes   Weight 3 lb   Reps 10-15     Interval Training   Interval Training No     Oxygen   Oxygen Continuous  only on treadmill   Liters 2     Treadmill   MPH 2   Grade 1   Minutes 15   METs 2.81     Recumbant Bike   Level 4   Watts 27   Minutes 15   METs 3.35     NuStep   Level 4   Minutes 15   METs 2.8  Home Exercise Plan   Plans to continue exercise at Home (comment)  walking   Frequency Add 2 additional days to program exercise sessions.   Initial Home Exercises Provided 04/12/17      Nutrition:  Target Goals: Understanding of nutrition guidelines, daily intake of sodium <1542m, cholesterol <2022m calories 30% from fat and 7% or less from saturated fats, daily to have 5 or more servings of fruits and vegetables.  Biometrics:     Pre Biometrics - 03/30/17 1527      Pre Biometrics   Height 5' 4" (1.626 m)   Weight 152 lb 1.6 oz (69 kg)   Waist Circumference 37.25 inches   Hip Circumference 39.5 inches   Waist to Hip Ratio 0.94 %   BMI (Calculated) 26.2   Single Leg Stand 30 seconds         Post Biometrics - 05/29/17 0835       Post  Biometrics   Height 5' 4" (1.626 m)   Weight 152 lb 8 oz (69.2 kg)   Waist Circumference 34 inches   Hip Circumference 36 inches   Waist to Hip Ratio 0.94 %   BMI (Calculated) 26.2   Single Leg Stand 30 seconds      Nutrition Therapy Plan and Nutrition Goals:     Nutrition Therapy & Goals - 04/17/17 1058       Nutrition Therapy   Diet basic heart healthy   Drug/Food Interactions Statins/Certain Fruits   Sodium 1500 grams     Personal Nutrition Goals   Nutrition Goal continue with current heart healthy eating pattern   Personal Goal #2 Use menus provided to help plan balanced meals when no longer receiving chef services   Comments Mr. Robson has a cuVeterinary surgeonnd is following a healthy eating pattern with help from his son. He is controlling sodium intake and limiting unhealthy fats while including generous vegetable and fruit portions. He chooses whole grain foods and eats beans regularly.      Intervention Plan   Intervention Prescribe, educate and counsel regarding individualized specific dietary modifications aiming towards targeted core components such as weight, hypertension, lipid management, diabetes, heart failure and other comorbidities.;Nutrition handout(s) given to patient.   Expected Outcomes Short Term Goal: A plan has been developed with personal nutrition goals set during dietitian appointment.;Long Term Goal: Adherence to prescribed nutrition plan.      Nutrition Discharge: Rate Your Plate Scores:     Nutrition Assessments - 05/22/17 0851      MEDFICTS Scores   Pre Score 6   Post Score 6   Score Difference 0      Nutrition Goals Re-Evaluation:     Nutrition Goals Re-Evaluation    Row Name 04/21/17 0842 05/17/17 0900           Goals   Nutrition Goal  - continue with current heart healthy eating pattern, use menus      Comment When I asked JoZaxtonow his appt went with the Cardiac REhab REgistered Dietician he said it was very helpful. Rajveer reports that "he has a peFacilities managerrom his church that cooks his lunch and dinner for him".  JoBrayonas a peFacilities managerrom church to prepare his meals.  They have continued  to focus on heart healthy eating. He feels he is getting a balanced diet.       Expected Outcome Heart Healthy eating plus eating for his COPD Short:  Continue to eat balanced diets.  Long:  Continue to eat heart healthy diet.         Nutrition Goals Discharge (Final Nutrition Goals Re-Evaluation):     Nutrition Goals Re-Evaluation - 05/17/17 0900      Goals   Nutrition Goal continue with current heart healthy eating pattern, use menus   Comment Veto has a Facilities manager from church to prepare his meals.  They have continued  to focus on heart healthy eating. He feels he is getting a balanced diet.    Expected Outcome Short: Continue to eat balanced diets.  Long: Continue to eat heart healthy diet.      Psychosocial: Target Goals: Acknowledge presence or absence of significant depression and/or stress, maximize coping skills, provide positive support system. Participant is able to verbalize types and ability to use techniques and skills needed for reducing stress and depression.   Initial Review & Psychosocial Screening:     Initial Psych Review & Screening - 03/30/17 1547      Initial Review   Current issues with Current Sleep Concerns  Sleeping in recliner so he can breath easier. Was not sleeping well prior to surgery. LIves in first floor apartment and noise from second floor would wake him up and messed his schedule up for being up and sleep.  Since came home has oxygen concentrator     Family Dynamics   Good Support System? Yes  son, son's church   Comments Sleeping better since oxygen concentrator in use.  Blocks out the noise from the apartment above him.      Barriers   Psychosocial barriers to participate in program There are no identifiable barriers or psychosocial needs.;The patient should benefit from training in stress management and relaxation.     Screening Interventions   Interventions Encouraged to exercise;To provide support and resources with identified psychosocial needs;Provide feedback about the scores to participant;Program counselor consult      Quality of Life Scores:      Quality of Life -  05/22/17 0851      Quality of Life Scores   Health/Function Pre 23.46 %   Health/Function Post 24.46 %   Health/Function % Change 4.26 %   Socioeconomic Pre 29 %   Socioeconomic Post 27.5 %   Socioeconomic % Change  -5.17 %   Psych/Spiritual Pre 30 %   Psych/Spiritual Post 28.29 %   Psych/Spiritual % Change -5.7 %   Family Pre 28.5 %   Family Post 28.5 %   Family % Change 0 %   GLOBAL Pre 26.58 %   GLOBAL Post 26.5 %   GLOBAL % Change -0.3 %      PHQ-9: Recent Review Flowsheet Data    Depression screen [facility] 2/9 05/22/2017 03/30/2017   Decreased Interest 0 2   Down, Depressed, Hopeless 0 0   PHQ - 2 Score 0 2   Altered sleeping 0 3    Tired, decreased energy 0 3    Change in appetite 0 1    Feeling bad or failure about yourself  0 0   Trouble concentrating 0 0   Moving slowly or fidgety/restless 0 0   Suicidal thoughts 0 0   PHQ-9 Score 0 9   Difficult doing work/chores Not difficult at all Not difficult at all     Interpretation of Total Score  Total Score Depression Severity:  1-4 = Minimal depression, 5-9 = Mild depression, 10-14 = Moderate depression, 15-19 = Moderately severe depression, 20-27 = Severe depression   Psychosocial Evaluation  and Intervention:     Psychosocial Evaluation - 05/15/17 1019      Psychosocial Evaluation & Interventions   Interventions Encouraged to exercise with the program and follow exercise prescription;Relaxation education   Comments Counselor met with Mr. Leicht today Jenny Reichmann) for initial psychosocial evaluation.  He is a 70 year old who had a CABGx3 the end of April and he struggles with COPD and shortness of breath as well.  Shaquil has a son who lives close by who is his primary support as Ethen moved from [place] approx. 1.5 years ago.  He has a good appetite and sleeps better now since coming into this program.  Gerrick is typically in a good mood and denies a history of depression or anxiety or any current symptoms.  He has goals to get off the  O2  while in this program and he has been able to sleep some without it at night already.  He has also learned a lot while in this program and reports his energy has increased as well. Ruhan has plans to work out at a fitness facility upon completion of this program.    Expected Outcomes Jamar will benefit from consistent exercise to achieve his stated goals.  He will also benefit from the educational components of this program to help understand and manage his current cardiac and pulmonary diagnoses.    Continue Psychosocial Services  Follow up required by staff      Psychosocial Re-Evaluation:     Psychosocial Re-Evaluation    Gentry Name 04/21/17 [phone]             Psychosocial Re-Evaluation   Current issues with Current Sleep Concerns       Comments Aleksander reports that last night he didn't use his oxgyen and he did well. Ifeoluwa said that he has a Facilities manager from his church cook his lunch and dinner for him and he makes his own breakfast. Malakie's son has bought him to Cardiac REhab since he started.        Expected Outcomes Cruise -heart healthy lifestyle.       Interventions Encouraged to attend Cardiac Rehabilitation for the exercise       Continue Psychosocial Services  Follow up required by staff          Psychosocial Discharge (Final Psychosocial Re-Evaluation):     Psychosocial Re-Evaluation - 04/21/17 0851      Psychosocial Re-Evaluation   Current issues with Current Sleep Concerns   Comments Drey reports that last night he didn't use his oxgyen and he did well. La said that he has a Facilities manager from his church cook his lunch and dinner for him and he makes his own breakfast. Osamu's son has bought him to Cardiac REhab since he started.    Expected Outcomes Adom -heart healthy lifestyle.   Interventions Encouraged to attend Cardiac Rehabilitation for the exercise   Continue Psychosocial Services  Follow up required by staff      Vocational Rehabilitation: Provide vocational  rehab assistance to qualifying candidates.   Vocational Rehab Evaluation & Intervention:     Vocational Rehab - 03/30/17 1552      Initial Vocational Rehab Evaluation & Intervention   Assessment shows need for Vocational Rehabilitation No      Education: Education Goals: Education classes will be provided on a weekly basis, covering required topics. Participant will state understanding/return demonstration of topics presented.  Learning Barriers/Preferences:     Learning Barriers/Preferences - 03/30/17 1552  Learning Barriers/Preferences   Learning Barriers None   Learning Preferences Skilled Demonstration;Pictoral;Individual Instruction;Written Material;Video      Education Topics: General Nutrition Guidelines/Fats and Fiber: -Group instruction provided by verbal, written material, models and posters to present the general guidelines for heart healthy nutrition. Gives an explanation and review of dietary fats and fiber.   Cardiac Rehab from 05/29/2017 in [facility] Cardiac and Pulmonary Rehab  Date  04/17/17  Educator  CR  Instruction Review Code  2- meets goals/outcomes      Controlling Sodium/Reading Food Labels: -Group verbal and written material supporting the discussion of sodium use in heart healthy nutrition. Review and explanation with models, verbal and written materials for utilization of the food label.   Cardiac Rehab from 05/29/2017 in [facility] Cardiac and Pulmonary Rehab  Date  04/24/17  Educator  CR  Instruction Review Code  2- meets goals/outcomes      Exercise Physiology & Risk Factors: - Group verbal and written instruction with models to review the exercise physiology of the cardiovascular system and associated critical values. Details cardiovascular disease risk factors and the goals associated with each risk factor.   Cardiac Rehab from 05/29/2017 in [facility] Cardiac and Pulmonary Rehab  Date  05/01/17  Educator  [facility]  Instruction Review Code  2- meets  goals/outcomes      Aerobic Exercise & Resistance Training: - Gives group verbal and written discussion on the health impact of inactivity. On the components of aerobic and resistive training programs and the benefits of this training and how to safely progress through these programs.   Cardiac Rehab from 05/29/2017 in [facility] Cardiac and Pulmonary Rehab  Date  05/03/17  Educator  SB  Instruction Review Code  2- meets goals/outcomes      Flexibility, Balance, General Exercise Guidelines: - Provides group verbal and written instruction on the benefits of flexibility and balance training programs. Provides general exercise guidelines with specific guidelines to those with heart or lung disease. Demonstration and skill practice provided.   Cardiac Rehab from 05/29/2017 in [facility] Cardiac and Pulmonary Rehab  Date  05/08/17  Educator  [facility]  Instruction Review Code  2- meets goals/outcomes      Stress Management: - Provides group verbal and written instruction about the health risks of elevated stress, cause of high stress, and healthy ways to reduce stress.   Cardiac Rehab from 05/29/2017 in [facility] Cardiac and Pulmonary Rehab  Date  05/17/17  Educator  [facility]  Instruction Review Code  2- meets goals/outcomes      Depression: - Provides group verbal and written instruction on the correlation between heart/lung disease and depressed mood, treatment options, and the stigmas associated with seeking treatment.   Cardiac Rehab from 05/29/2017 in [facility] Cardiac and Pulmonary Rehab  Date  04/19/17  Educator  [facility]  Instruction Review Code  2- meets goals/outcomes      Anatomy & Physiology of the Heart: - Group verbal and written instruction and models provide basic cardiac anatomy and physiology, with the coronary electrical and arterial systems. Review of: AMI, Angina, Valve disease, Heart Failure, Cardiac Arrhythmia, Pacemakers, and the ICD.   Cardiac Rehab from 05/29/2017 in [facility] Cardiac and Pulmonary Rehab   Date  05/15/17  Educator  CE  Instruction Review Code  2- meets goals/outcomes      Cardiac Procedures: - Group verbal and written instruction and models to describe the testing methods done to diagnose heart disease. Reviews the outcomes of the test results. Describes the treatment choices:  Medical Management, Angioplasty, or Coronary Bypass Surgery.   Cardiac Rehab from 05/29/2017 in [facility] Cardiac and Pulmonary Rehab  Date  05/22/17  Educator  CE  Instruction Review Code  2- meets goals/outcomes      Cardiac Medications: - Group verbal and written instruction to review commonly prescribed medications for heart disease. Reviews the medication, class of the drug, and side effects. Includes the steps to properly store meds and maintain the prescription regimen.   Cardiac Rehab from 05/29/2017 in [facility] Cardiac and Pulmonary Rehab  Date  05/29/17 [05/29/17 Part 1]  Educator  CE  Instruction Review Code  2- meets goals/outcomes      Go Sex-Intimacy & Heart Disease, Get SMART - Goal Setting: - Group verbal and written instruction through game format to discuss heart disease and the return to sexual intimacy. Provides group verbal and written material to discuss and apply goal setting through the application of the S.M.A.R.T. Method.   Cardiac Rehab from 05/29/2017 in [facility] Cardiac and Pulmonary Rehab  Date  05/22/17  Educator  CE  Instruction Review Code  2- meets goals/outcomes      Other Matters of the Heart: - Provides group verbal, written materials and models to describe Heart Failure, Angina, Valve Disease, and Diabetes in the realm of heart disease. Includes description of the disease process and treatment options available to the cardiac patient.   Cardiac Rehab from 05/29/2017 in [facility] Cardiac and Pulmonary Rehab  Date  05/15/17  Educator  CE  Instruction Review Code  2- meets goals/outcomes      Exercise & Equipment Safety: - Individual verbal instruction and demonstration of  equipment use and safety with use of the equipment.   Cardiac Rehab from 05/29/2017 in [facility] Cardiac and Pulmonary Rehab  Date  03/30/17  Educator  SB  Instruction Review Code  2- meets goals/outcomes      Infection Prevention: - Provides verbal and written material to individual with discussion of infection control including proper hand washing and proper equipment cleaning during exercise session.   Cardiac Rehab from 05/29/2017 in [facility] Cardiac and Pulmonary Rehab  Date  03/30/17  Educator  Sb  Instruction Review Code  2- meets goals/outcomes      Falls Prevention: - Provides verbal and written material to individual with discussion of falls prevention and safety.   Cardiac Rehab from 05/29/2017 in [facility] Cardiac and Pulmonary Rehab  Date  03/30/17  Educator  SB  Instruction Review Code  2- meets goals/outcomes      Diabetes: - Individual verbal and written instruction to review signs/symptoms of diabetes, desired ranges of glucose level fasting, after meals and with exercise. Advice that pre and post exercise glucose checks will be done for 3 sessions at entry of program.    Knowledge Questionnaire Score:     Knowledge Questionnaire Score - 05/22/17 0851      Knowledge Questionnaire Score   Post Score 28/28      Core Components/Risk Factors/Patient Goals at Admission:     Personal Goals and Risk Factors at Admission - 03/30/17 1546      Core Components/Risk Factors/Patient Goals on Admission   Lipids Yes   Intervention Provide education and support for participant on nutrition & aerobic/resistive exercise along with prescribed medications to achieve LDL <59m, HDL >418m   Expected Outcomes Short Term: Participant states understanding of desired cholesterol values and is compliant with medications prescribed. Participant is following exercise prescription and nutrition guidelines.;Long Term: Cholesterol controlled with medications as  prescribed, with individualized exercise  RX and with personalized nutrition plan. Value goals: LDL < 69m, HDL > 40 mg.      Core Components/Risk Factors/Patient Goals Review:      Goals and Risk Factor Review    Row Name 04/21/17 0849 05/17/17 0856           Core Components/Risk Factors/Patient Goals Review   Personal Goals Review Lipids Lipids;Weight Management/Obesity;Improve shortness of breath with ADL's      Review JHoylereports that his "personal chef has all the heart healthy rules and diets and he cooks all my lunches and dinners for me". Alver reports that he makes his own breakfast and eats healty. Jarrius gets his lipids checked by his MD.  JDrevonhas not had any problems with his medications.  He is able to do more at home now without getting as short of breath.  He is trying to come off of his oxygen some.  His weight has been steady.      Expected Outcomes Heart healthy living will using 2 liters of oxgyen currently since his surgeyr. Short: Continue to work on improving his SOB with activities.  Long: Continue to work on risk factor modifications.         Core Components/Risk Factors/Patient Goals at Discharge (Final Review):      Goals and Risk Factor Review - 05/17/17 0856      Core Components/Risk Factors/Patient Goals Review   Personal Goals Review Lipids;Weight Management/Obesity;Improve shortness of breath with ADL's   Review JBrionhas not had any problems with his medications.  He is able to do more at home now without getting as short of breath.  He is trying to come off of his oxygen some.  His weight has been steady.   Expected Outcomes Short: Continue to work on improving his SOB with activities.  Long: Continue to work on risk factor modifications.      ITP Comments:     ITP Comments    Row Name 03/30/17 1537 04/07/17 0857 04/12/17 0648 04/21/17 0836 05/10/17 06568  ITP Comments Medical review completed, INitial ITP created   [facility] of [facility] Uses oxgyen 2  liters/minute.  30 day review. Continue with ITP unless directed changes per Medical Director review. New to program JXaidenarrives on 2 liters of oxgyen that he reports he has had since surgery this year. Izzak said he didn't use his oxgyen to sleep last night. He reported that he shut off his oxgyen for a few minutes while riding the bicycle in Cardiac Rehab today.  30 day review. Continue with ITP unless directed changes per Medical Director review         Comments: discharge ITP

## 2017-05-30 ENCOUNTER — Emergency Department: Payer: Medicare Other

## 2017-05-30 ENCOUNTER — Inpatient Hospital Stay
Admission: EM | Admit: 2017-05-30 | Discharge: 2017-06-02 | DRG: 200 | Disposition: A | Discharge status: Home / Self Care | Payer: Medicare Other | Attending: Internal Medicine | Admitting: Internal Medicine

## 2017-05-30 ENCOUNTER — Other Ambulatory Visit: Payer: Self-pay

## 2017-05-30 VITALS — Ht 64.6 in | Wt 150.8 lb

## 2017-05-30 DIAGNOSIS — E782 Mixed hyperlipidemia: Secondary | ICD-10-CM | POA: Diagnosis present

## 2017-05-30 DIAGNOSIS — Z87891 Personal history of nicotine dependence: Secondary | ICD-10-CM

## 2017-05-30 DIAGNOSIS — J9312 Secondary spontaneous pneumothorax: Secondary | ICD-10-CM | POA: Diagnosis not present

## 2017-05-30 DIAGNOSIS — N400 Enlarged prostate without lower urinary tract symptoms: Secondary | ICD-10-CM | POA: Diagnosis present

## 2017-05-30 DIAGNOSIS — Z7951 Long term (current) use of inhaled steroids: Secondary | ICD-10-CM

## 2017-05-30 DIAGNOSIS — Z9981 Dependence on supplemental oxygen: Secondary | ICD-10-CM

## 2017-05-30 DIAGNOSIS — Z938 Other artificial opening status: Secondary | ICD-10-CM

## 2017-05-30 DIAGNOSIS — J449 Chronic obstructive pulmonary disease, unspecified: Secondary | ICD-10-CM

## 2017-05-30 DIAGNOSIS — J939 Pneumothorax, unspecified: Secondary | ICD-10-CM | POA: Diagnosis not present

## 2017-05-30 DIAGNOSIS — I255 Ischemic cardiomyopathy: Secondary | ICD-10-CM | POA: Diagnosis present

## 2017-05-30 DIAGNOSIS — Z7982 Long term (current) use of aspirin: Secondary | ICD-10-CM | POA: Diagnosis not present

## 2017-05-30 DIAGNOSIS — Z79899 Other long term (current) drug therapy: Secondary | ICD-10-CM

## 2017-05-30 DIAGNOSIS — Z951 Presence of aortocoronary bypass graft: Secondary | ICD-10-CM | POA: Diagnosis not present

## 2017-05-30 DIAGNOSIS — J9311 Primary spontaneous pneumothorax: Secondary | ICD-10-CM | POA: Diagnosis not present

## 2017-05-30 DIAGNOSIS — J9610 Chronic respiratory failure, unspecified whether with hypoxia or hypercapnia: Secondary | ICD-10-CM | POA: Diagnosis present

## 2017-05-30 DIAGNOSIS — I252 Old myocardial infarction: Secondary | ICD-10-CM | POA: Diagnosis not present

## 2017-05-30 DIAGNOSIS — I2510 Atherosclerotic heart disease of native coronary artery without angina pectoris: Secondary | ICD-10-CM | POA: Diagnosis present

## 2017-05-30 DIAGNOSIS — R0602 Shortness of breath: Secondary | ICD-10-CM

## 2017-05-30 DIAGNOSIS — J9383 Other pneumothorax: Principal | ICD-10-CM | POA: Diagnosis present

## 2017-05-30 DIAGNOSIS — Z09 Encounter for follow-up examination after completed treatment for conditions other than malignant neoplasm: Secondary | ICD-10-CM

## 2017-05-30 LAB — CBC
HEMATOCRIT: 42 % (ref 40.0–52.0)
HEMOGLOBIN: 13.9 g/dL (ref 13.0–18.0)
MCH: 30.2 pg (ref 26.0–34.0)
MCHC: 33.1 g/dL (ref 32.0–36.0)
MCV: 91.3 fL (ref 80.0–100.0)
Platelets: 216 10*3/uL (ref 150–440)
RBC: 4.6 MIL/uL (ref 4.40–5.90)
RDW: 15.6 % — ABNORMAL HIGH (ref 11.5–14.5)
WBC: 9.1 10*3/uL (ref 3.8–10.6)

## 2017-05-30 LAB — BASIC METABOLIC PANEL
ANION GAP: 10 (ref 5–15)
BUN: 27 mg/dL — ABNORMAL HIGH (ref 6–20)
CALCIUM: 9.5 mg/dL (ref 8.9–10.3)
CO2: 23 mmol/L (ref 22–32)
Chloride: 104 mmol/L (ref 101–111)
Creatinine, Ser: 1.13 mg/dL (ref 0.61–1.24)
Glucose, Bld: 115 mg/dL — ABNORMAL HIGH (ref 65–99)
POTASSIUM: 4.9 mmol/L (ref 3.5–5.1)
SODIUM: 137 mmol/L (ref 135–145)

## 2017-05-30 LAB — TROPONIN I

## 2017-05-30 MED ORDER — ZOLPIDEM TARTRATE 5 MG PO TABS: 5.0000 mg | ORAL_TABLET | Freq: Every evening | ORAL | Status: DC | PRN

## 2017-05-30 MED ORDER — MORPHINE SULFATE (PF) 2 MG/ML IV SOLN
2.0000 mg | INTRAVENOUS | Status: DC | PRN
  Administered 2017-05-30: 2 mg via INTRAVENOUS
  Filled 2017-05-30: qty 1

## 2017-05-30 MED ORDER — LACTATED RINGERS IV SOLN
INTRAVENOUS | Status: DC
  Administered 2017-05-30 – 2017-05-31 (×2): via INTRAVENOUS

## 2017-05-30 MED ORDER — OXYCODONE HCL 5 MG PO TABS
5.0000 mg | ORAL_TABLET | ORAL | Status: DC | PRN
  Administered 2017-05-31 – 2017-06-02 (×5): 10 mg via ORAL
  Filled 2017-05-30 (×5): qty 2

## 2017-05-30 MED ORDER — ONDANSETRON HCL 4 MG/2ML IJ SOLN
4.0000 mg | Freq: Four times a day (QID) | INTRAMUSCULAR | Status: DC | PRN
Start: 2017-05-30 — End: 2017-06-02

## 2017-05-30 MED ORDER — PROCHLORPERAZINE EDISYLATE 5 MG/ML IJ SOLN
5.0000 mg | Freq: Four times a day (QID) | INTRAMUSCULAR | Status: DC | PRN
  Filled 2017-05-30: qty 2

## 2017-05-30 MED ORDER — PROCHLORPERAZINE MALEATE 10 MG PO TABS
10.0000 mg | ORAL_TABLET | Freq: Four times a day (QID) | ORAL | Status: DC | PRN
  Filled 2017-05-30: qty 1

## 2017-05-30 MED ORDER — METOPROLOL TARTRATE 25 MG PO TABS
12.5000 mg | ORAL_TABLET | Freq: Two times a day (BID) | ORAL | Status: DC
  Administered 2017-05-30 – 2017-05-31 (×3): 12.5 mg via ORAL
  Filled 2017-05-30 (×3): qty 1

## 2017-05-30 MED ORDER — ACETAMINOPHEN 500 MG PO TABS
1000.0000 mg | ORAL_TABLET | Freq: Four times a day (QID) | ORAL | Status: DC
  Administered 2017-05-30 – 2017-06-02 (×10): 1000 mg via ORAL
  Filled 2017-05-30 (×11): qty 2

## 2017-05-30 MED ORDER — ONDANSETRON 4 MG PO TBDP
4.0000 mg | ORAL_TABLET | Freq: Four times a day (QID) | ORAL | Status: DC | PRN
  Filled 2017-05-30: qty 1

## 2017-05-30 MED ORDER — LIDOCAINE HCL (PF) 1 % IJ SOLN
0.0000 mL | Freq: Once | INTRAMUSCULAR | Status: DC | PRN
  Filled 2017-05-30: qty 30

## 2017-05-30 MED ORDER — IPRATROPIUM-ALBUTEROL 0.5-2.5 (3) MG/3ML IN SOLN
3.0000 mL | Freq: Four times a day (QID) | RESPIRATORY_TRACT | Status: DC
  Administered 2017-05-30 – 2017-05-31 (×2): 3 mL via RESPIRATORY_TRACT
  Filled 2017-05-30 (×3): qty 3

## 2017-05-30 MED ORDER — PANTOPRAZOLE SODIUM 40 MG IV SOLR
40.0000 mg | Freq: Every day | INTRAVENOUS | Status: DC
  Administered 2017-05-30 – 2017-05-31 (×2): 40 mg via INTRAVENOUS
  Filled 2017-05-30 (×2): qty 40

## 2017-05-30 MED ORDER — HYDRALAZINE HCL 20 MG/ML IJ SOLN: 10.0000 mg | INTRAMUSCULAR | Status: DC | PRN

## 2017-05-30 NOTE — ED Provider Notes (Signed)
[facility] Emergency Department Provider Note  Time seen: 4:18 PM  I have reviewed the triage vital signs and the nursing notes.   HISTORY  Chief Complaint Shortness of Breath    HPI Thomas Trujillo is a 71 y.o. male with a past medical history of CABG, hyperlipidemia, COPD, presents to the emergency department for trouble breathing. According to the patient since midnight last night he has been having progressively worsening difficulty breathing. He went to his pulmonologist today Dr. Raul Del who obtain an x-ray showing a left pneumothorax. Patient states the shortness of breath has progressively worsened. Denies any chest pain, fever, increased cough. Now the patient states moderate to significant shortness of breath much worse with any type of exertion.   Past Medical History:  Diagnosis Date  . Bleb, lung ([place])   . Coronary artery disease 02/14/2017   NSTEMI with urgent CABG (LIMA-LAD and SVG->ramus)  . Hyperlipidemia   . Ischemic cardiomyopathy   . Patient denies medical problems     Patient Active Problem List   Diagnosis Date Noted  . Ischemic cardiomyopathy 04/05/2017  . Mixed hyperlipidemia 04/05/2017  . Coronary artery disease involving native coronary artery of native heart without angina pectoris 02/24/2017  . 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 NSTEMI (non-ST elevated myocardial infarction) ([place]) 02/13/2017    Past Surgical History:  Procedure Laterality Date  . ABDOMINAL HYSTERECTOMY    . CATARACT EXTRACTION, BILATERAL    . CORONARY ARTERY BYPASS GRAFT N/A 02/14/2017   Procedure: CORONARY ARTERY BYPASS GRAFTING (CABG) x 2 , using left internal mammary artery and right leg greater saphenous vein harvested endoscopically LIMA-LAD, SVG-RAMUS;  Surgeon: [name], MD;  Location: [place];  Service: Open Heart Surgery;  Laterality: N/A;  . ENDOVEIN HARVEST OF GREATER SAPHENOUS VEIN Right 02/14/2017   Procedure: ENDOVEIN HARVEST OF RIGHT THIGH GREATER  SAPHENOUS VEIN;  Surgeon: [name], MD;  Location: [place];  Service: Open Heart Surgery;  Laterality: Right;  . LEFT HEART CATH AND CORONARY ANGIOGRAPHY N/A 02/14/2017   Procedure: Left Heart Cath and Coronary Angiography;  Surgeon: [name], MD;  Location: [place] [facility];  Service: Cardiovascular;  Laterality: N/A;  . NASAL POLYP SURGERY    . STAPLING OF BLEBS N/A 02/14/2017   Procedure: STAPLING OF LARGE LEFT UPPER LOBE PULMONARY BLEB;  Surgeon: [name], MD;  Location: [place];  Service: Open Heart Surgery;  Laterality: N/A;  . TEE WITHOUT CARDIOVERSION N/A 02/14/2017   Procedure: TRANSESOPHAGEAL ECHOCARDIOGRAM (TEE);  Surgeon: [name], MD;  Location: [place];  Service: Open Heart Surgery;  Laterality: N/A;    Prior to Admission medications   Medication Sig Start Date End Date Taking? Authorizing Provider  acetaminophen (TYLENOL) 500 MG tablet Take 500 mg by mouth every 6 (six) hours as needed.    [provider]  aspirin 325 MG EC tablet Take 1 tablet (325 mg total) by mouth daily. 02/25/17   Elgie Collard, PA-C  atorvastatin (LIPITOR) 40 MG tablet Take 1 tablet (40 mg total) by mouth daily. 04/05/17 07/04/17  End, Harrell Gave, MD  bisacodyl (DULCOLAX) 5 MG EC tablet Take 2 tablets (10 mg total) by mouth daily as needed for moderate constipation (Give daily if no BM). 02/26/17   Elgie Collard, PA-C  ferrous sulfate 325 (65 FE) MG tablet Take 325 mg by mouth daily with breakfast.    [provider]  furosemide (LASIX) 20 MG tablet TAKE 1 TABLET BY  MOUTH EVERY DAY 04/16/17   [place] Gum, Collier Salina, MD  furosemide (LASIX) 20 MG tablet Take 1-2 tablets (20-40 mg total) by mouth as directed. Once daily with additional dose if he gains 2 pounds in a day or 5 pounds in a week. 04/27/17   End, Harrell Gave, MD  ipratropium-albuterol (DUONEB) 0.5-2.5 (3) MG/3ML SOLN Take 3 mLs by nebulization every 6 (six) hours as needed. When awake     [provider]   KLOR-CON M10 10 MEQ tablet TAKE 1 TABLET BY MOUTH EVERY DAY 04/16/17   [place] Gum, Collier Salina, MD  metoprolol tartrate (LOPRESSOR) 25 MG tablet Take 12.5 mg by mouth 2 (two) times daily. Hold for SBP< 110 or HR <60    [provider]  OXYGEN Inhale into the lungs. By nasal cannula and wean as tolerated to keep O2 sats >90%    [provider]  potassium chloride (K-DUR) 10 MEQ tablet Take 10 mEq by mouth daily.    [provider]  tamsulosin (FLOMAX) 0.4 MG CAPS capsule Take 1 capsule (0.4 mg total) by mouth daily. 02/25/17   Elgie Collard, PA-C    Allergies  Allergen Reactions  . No Known Allergies     Family History  Problem Relation Age of Onset  . Obesity Son     Social History Social History  Substance Use Topics  . Smoking status: Former Smoker    Quit date: 04/06/2007  . Smokeless tobacco: Never Used  . Alcohol use No    Review of Systems Constitutional: Negative for fever. Cardiovascular: Negative for chest pain. Respiratory: Positive for shortness of breath Gastrointestinal: Negative for abdominal pain Musculoskeletal: Negative for back pain. Negative for leg pain or swelling. Neurological: Negative for headache All other ROS negative  ____________________________________________   PHYSICAL EXAM:  VITAL SIGNS: ED Triage Vitals  Enc Vitals Group     BP 05/30/17 1601 (!) 134/59     Pulse Rate 05/30/17 1601 96     Resp 05/30/17 1601 (!) 24     Temp 05/30/17 1601 98 F (36.7 C)     Temp Source 05/30/17 1601 Oral     SpO2 05/30/17 1601 92 %     Weight 05/30/17 1602 150 lb (68 kg)     Height 05/30/17 1602 5\' 4"  (1.626 m)     Head Circumference --      Peak Flow --      Pain Score 05/30/17 1601 8     Pain Loc --      Pain Edu? --      Excl. in [place]? --     Constitutional: Alert and oriented. Sitting up in bed, taking deep breaths, mild respiratory distress, but still is able to talk/converse in  2-3 word sentences. Eyes: Normal  exam ENT   Head: Normocephalic and atraumatic.   Mouth/Throat: Mucous membranes are moist. Cardiovascular: Normal rate, regular rhythm. No murmur Respiratory: Normal respiratory effort without tachypnea nor retractions.  Gastrointestinal: Soft and nontender. No distention.   Musculoskeletal: Nontender with normal range of motion in all extremities. No lower extremity tenderness or edema. Neurologic:  Normal speech and language. No gross focal neurologic deficits  Skin:  Skin is warm, dry and intact.  Psychiatric: Mood and affect are normal.  ____________________________________________    EKG  EKG reviewed and interpreted by myself appears to show sinus rhythm at 90 bpm with a narrow QRS, normal axis, appears to have largely normal intervals, there is significant which showed interference due to  the patient taking deep breaths. No ST elevation noted but again significant which go interference due to current breathing pattern.  ____________________________________________    RADIOLOGY  X-ray consistent with left-sided pneumothorax.  ____________________________________________   INITIAL IMPRESSION / ASSESSMENT AND PLAN / ED COURSE  Pertinent labs & imaging results that were available during my care of the patient were reviewed by me and considered in my medical decision making (see chart for details).  Patient presents to the emergency department for worsening shortness breath over the past 16 hours. Per report the patient has a left-sided pneumothorax however there was no x-ray in our system that can be reviewed nor any report that can be read. We'll obtain a stat repeat portable chest x-ray to evaluate.  X-ray consistent left pneumothorax. Discussed with Dr. Dahlia Byes who is requesting a CT scan of the chest given the patient's history of lung blebs. X-ray is negative for tension pneumothorax.   CT scan shows pneumothorax as well with significant amount of blebs. Discussed  the patient with Dr.Pabon, who is requesting interventional radiology for chest tube placement given the amount of blebs on CT. We will discuss with interventional for possible chest tube placement.  Discussed the patient with interventional radiology, they will discussed with general surgery to see who will place the tube. CT read as 50% pneumothorax. Patient remains in very mild distress but no significant distress, satting well on 2 L.  Dr. Dahlia Byes discussed with Dr. Barbie Banner, with a plan to perform image guided chest tube in the morning, if needed urgently overnight surgery will place.   Patient will be admitted to the hospital.  ____________________________________________   FINAL CLINICAL IMPRESSION(S) / ED DIAGNOSES  Dyspnea Pneumothorax    Harvest Dark, MD 05/30/17 1818

## 2017-05-30 NOTE — ED Notes (Signed)
Pt's son to desk at this time for update, received update from Dr. Kerman Passey. Pt resting in bed with eyes closed, still noted to be tachypneic at this time, remains on 2L, no other changes in patient condition. Will continue to monitor for further patient needs.

## 2017-05-30 NOTE — Patient Instructions (Signed)
Patient Instructions  Patient Details  Name: Thomas Trujillo MRN: [id number] Date of Birth: 10/26/1945 Referring Provider:  Erby Pian, MD  Below are the personal goals you chose as well as exercise and nutrition goals. Our goal is to help you keep on track towards obtaining and maintaining your goals. We will be discussing your progress on these goals with you throughout the program.  Initial Exercise Prescription:     Initial Exercise Prescription - 05/30/17 1200      Date of Initial Exercise RX and Referring Provider   Date 05/30/17   Referring Provider Ancil Linsey MD     Oxygen   Oxygen Continuous  Primarily on treadmill   Liters 2     Treadmill   MPH 2   Grade 1   Minutes 15   METs 2.81     NuStep   Level 4   SPM 80   Minutes 15   METs 2.8     Arm Ergometer   Level 3   RPM 35   Minutes 15   METs 2.8     Resistance Training   Training Prescription Yes   Weight 3 lb   Reps 10-15      Exercise Goals: Frequency: Be able to perform aerobic exercise three times per week working toward 3-5 days per week.  Intensity: Work with a perceived exertion of 11 (fairly light) - 15 (hard) as tolerated. Follow your new exercise prescription and watch for changes in prescription as you progress with the program. Changes will be reviewed with you when they are made.  Duration: You should be able to do 30 minutes of continuous aerobic exercise in addition to a 5 minute warm-up and a 5 minute cool-down routine.  Nutrition Goals: Your personal nutrition goals will be established when you do your nutrition analysis with the dietician.  The following are nutrition guidelines to follow: Cholesterol < 200mg /day Sodium < 1500mg /day Fiber: Men over 50 yrs - 30 grams per day  Personal Goals:     Personal Goals and Risk Factors at Admission - 05/30/17 1126      Core Components/Risk Factors/Patient Goals on Admission    Weight Management Yes   Intervention Weight  Management: Develop a combined nutrition and exercise program designed to reach desired caloric intake, while maintaining appropriate intake of nutrient and fiber, sodium and fats, and appropriate energy expenditure required for the weight goal.;Weight Management: Provide education and appropriate resources to help participant work on and attain dietary goals.;Weight Management/Obesity: Establish reasonable short term and long term weight goals.   Admit Weight 150 lb (68 kg)   Goal Weight: Short Term 150 lb (68 kg)   Goal Weight: Long Term 150 lb (68 kg)  wants to maintain his current weight   Expected Outcomes Short Term: Continue to assess and modify interventions until short term weight is achieved;Long Term: Adherence to nutrition and physical activity/exercise program aimed toward attainment of established weight goal;Weight Maintenance: Understanding of the daily nutrition guidelines, which includes 25-35% calories from fat, 7% or less cal from saturated fats, less than 200mg  cholesterol, less than 1.5gm of sodium, & 5 or more servings of fruits and vegetables daily;Understanding recommendations for meals to include 15-35% energy as protein, 25-35% energy from fat, 35-60% energy from carbohydrates, less than 200mg  of dietary cholesterol, 20-35 gm of total fiber daily;Understanding of distribution of calorie intake throughout the day with the consumption of 4-5 meals/snacks   Improve shortness of breath with ADL's Yes  Intervention Provide education, individualized exercise plan and daily activity instruction to help decrease symptoms of SOB with activities of daily living.   Expected Outcomes Short Term: Achieves a reduction of symptoms when performing activities of daily living.   Develop more efficient breathing techniques such as purse lipped breathing and diaphragmatic breathing; and practicing self-pacing with activity Yes   Intervention Provide education, demonstration and support about  specific breathing techniuqes utilized for more efficient breathing. Include techniques such as pursed lipped breathing, diaphragmatic breathing and self-pacing activity.   Expected Outcomes Short Term: Participant will be able to demonstrate and use breathing techniques as needed throughout daily activities.   Increase knowledge of respiratory medications and ability to use respiratory devices properly  Yes   Intervention Provide education and demonstration as needed of appropriate use of medications, inhalers, and oxygen therapy.   Expected Outcomes Short Term: Achieves understanding of medications use. Understands that oxygen is a medication prescribed by physician. Demonstrates appropriate use of inhaler and oxygen therapy.   Heart Failure Yes   Intervention Provide a combined exercise and nutrition program that is supplemented with education, support and counseling about heart failure. Directed toward relieving symptoms such as shortness of breath, decreased exercise tolerance, and extremity edema.   Expected Outcomes Improve functional capacity of life;Short term: Attendance in program 2-3 days a week with increased exercise capacity. Reported lower sodium intake. Reported increased fruit and vegetable intake. Reports medication compliance.;Short term: Daily weights obtained and reported for increase. Utilizing diuretic protocols set by physician.;Long term: Adoption of self-care skills and reduction of barriers for early signs and symptoms recognition and intervention leading to self-care maintenance.   Lipids Yes   Intervention Provide education and support for participant on nutrition & aerobic/resistive exercise along with prescribed medications to achieve LDL 70mg , HDL >40mg .   Expected Outcomes Short Term: Participant states understanding of desired cholesterol values and is compliant with medications prescribed. Participant is following exercise prescription and nutrition guidelines.;Long  Term: Cholesterol controlled with medications as prescribed, with individualized exercise RX and with personalized nutrition plan. Value goals: LDL < 70mg , HDL > 40 mg.   Personal Goal Other Yes   Personal Goal Increase upper body strength   Expected Outcomes Short: start pulmonary rehab. Long: To have increased upper body strength to increase his ADL      Tobacco Use Initial Evaluation: History  Smoking Status  . Former Smoker  . Quit date: 04/06/2007  Smokeless Tobacco  . Never Used    Copy of goals given to participant.

## 2017-05-30 NOTE — ED Triage Notes (Signed)
Pt sent from [facility] with a left pneumothorax.  Pt on 2 liters oxygen.  Sx began last night.  Pt alert.  Taken to room 5

## 2017-05-30 NOTE — ED Notes (Signed)
Dr. Dahlia Byes at bedside at this time.

## 2017-05-30 NOTE — ED Notes (Signed)
No change in patient condition. Pt resting in bed with son at bedside. Pt son asking when patient will go upstairs, this RN explained the process to patient's son, states understanding. Pt is alert at this time. Will continue to monitor for further patient needs.

## 2017-05-30 NOTE — Progress Notes (Signed)
Pt given SVN tx. Post SVN treatment pt was placed on NRB per MD order and documentation. Pt was 93% on 2.5L [place]. 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 Will cont to monitor pt throughout the night.

## 2017-05-30 NOTE — Progress Notes (Signed)
Daily Session Note  Patient Details  Name: Thomas Trujillo MRN: [id number] Date of Birth: 09-07-1946 Referring Provider:     Pulmonary Rehab from 05/30/2017 in [facility]  Referring Provider  Ancil Linsey MD      Encounter Date: 05/30/2017  Check In:     Session Check In - 05/30/17 1123      Check-In   Location [facility]-Cardiac & Pulmonary [facility]   Staff Present Justin Mend Lorre Nick, [place], ACSM RCEP, Exercise Physiologist   Supervising physician immediately available to respond to emergencies [facility] immediately available ER MD   Physician(s) Dr. Mable Paris and Jimmye Norman   Medication changes reported     No   Fall or balance concerns reported    No   Tobacco Cessation --  quit ten years ago   Warm-up and Cool-down Not performed (comment)  Medical Evaluation   Resistance Training Performed No   VAD Patient? No     Pain Assessment   Currently in Pain? No/denies   Multiple Pain Sites No           Exercise Prescription Changes - 05/30/17 1200      Response to Exercise   Blood Pressure (Admit) 134/70   Blood Pressure (Exercise) 150/68   Blood Pressure (Exit) 126/64   Heart Rate (Admit) 88 bpm   Heart Rate (Exercise) 99 bpm   Heart Rate (Exit) 88 bpm   Rating of Perceived Exertion (Exercise) 15   Perceived Dyspnea (Exercise) 4   Symptoms SOB   Comments walk test results      History  Smoking Status  . Former Smoker  . Quit date: 04/06/2007  Smokeless Tobacco  . Never Used    Goals Met:  Exercise tolerated well Personal goals reviewed Queuing for purse lip breathing No report of cardiac concerns or symptoms  Goals Unmet:  Not Applicable  Comments: Medical evaluation completed. Met with Dr. Emily Filbert Director of [place]. Chart reviewed and Signed   Dr. Emily Filbert is Medical Director for [facility] and [facility].

## 2017-05-30 NOTE — Progress Notes (Signed)
Pulmonary Individual Treatment Plan  Patient Details  Name: Thomas Trujillo  MRN: [id number] Date of Birth: 17-Mar-1946 Referring Provider:     Pulmonary Rehab from 05/30/2017 in [facility] Cardiac and Pulmonary Rehab  Referring Provider  Ancil Linsey MD      Initial Encounter Date:    Pulmonary Rehab from 05/30/2017 in [facility] Cardiac and Pulmonary Rehab  Date  05/30/17  Referring Provider  Ancil Linsey MD      Visit Diagnosis: Chronic obstructive pulmonary disease, unspecified COPD type ([place])  Patient's Home Medications on Admission:  Current Outpatient Prescriptions:  .  acetaminophen (TYLENOL) 500 MG tablet, Take 500 mg by mouth every 6 (six) hours as needed., Disp: , Rfl:  .  aspirin 325 MG EC tablet, Take 1 tablet (325 mg total) by mouth daily., Disp: 30 tablet, Rfl: 0 .  atorvastatin (LIPITOR) 40 MG tablet, Take 1 tablet (40 mg total) by mouth daily., Disp: 90 tablet, Rfl: 3 .  bisacodyl (DULCOLAX) 5 MG EC tablet, Take 2 tablets (10 mg total) by mouth daily as needed for moderate constipation (Give daily if no BM)., Disp: 30 tablet, Rfl: 0 .  ferrous sulfate 325 (65 FE) MG tablet, Take 325 mg by mouth daily with breakfast., Disp: , Rfl:  .  furosemide (LASIX) 20 MG tablet, TAKE 1 TABLET BY MOUTH EVERY DAY, Disp: 5 tablet, Rfl: 0 .  furosemide (LASIX) 20 MG tablet, Take 1-2 tablets (20-40 mg total) by mouth as directed. Once daily with additional dose if he gains 2 pounds in a day or 5 pounds in a week., Disp: 60 tablet, Rfl: 3 .  ipratropium-albuterol (DUONEB) 0.5-2.5 (3) MG/3ML SOLN, Take 3 mLs by nebulization every 6 (six) hours as needed. When awake , Disp: , Rfl:  .  KLOR-CON M10 10 MEQ tablet, TAKE 1 TABLET BY MOUTH EVERY DAY, Disp: 5 tablet, Rfl: 0 .  metoprolol tartrate (LOPRESSOR) 25 MG tablet, Take 12.5 mg by mouth 2 (two) times daily. Hold for SBP< 110 or HR <60, Disp: , Rfl:  .  OXYGEN, Inhale into the lungs. By nasal cannula and wean as tolerated to keep O2 sats >90%, Disp:  , Rfl:  .  potassium chloride (K-DUR) 10 MEQ tablet, Take 10 mEq by mouth daily., Disp: , Rfl:  .  tamsulosin (FLOMAX) 0.4 MG CAPS capsule, Take 1 capsule (0.4 mg total) by mouth daily., Disp: 30 capsule, Rfl: 1  Past Medical History: Past Medical History:  Diagnosis Date  . Bleb, lung ([place])   . Coronary artery disease 02/14/2017   NSTEMI with urgent CABG (LIMA-LAD and SVG->ramus)  . Hyperlipidemia   . Ischemic cardiomyopathy   . Patient denies medical problems     Tobacco Use: History  Smoking Status  . Former Smoker  . Quit date: 04/06/2007  Smokeless Tobacco  . Never Used    Labs: Recent Review Flowsheet Data    Labs for ITP Cardiac and Pulmonary Rehab Latest Ref Rng & Units 02/15/2017 02/15/2017 02/15/2017 02/18/2017 02/18/2017   Cholestrol 0 - 200 mg/dL - - - - -   LDLCALC 0 - 99 mg/dL - - - - -   HDL >40 mg/dL - - - - -   Trlycerides <150 mg/dL - - - - -   Hemoglobin A1c 4.8 - 5.6 % - - - - -   PHART 7.350 - 7.450 7.316(L) 7.344(L) - 7.393 -   PCO2ART 32.0 - 48.0 mmHg 46.8 42.7 - 50.1(H) -   HCO3 20.0 - 28.0 mmol/L  23.7 23.3 - 30.5(H) -   TCO2 0 - 100 mmol/L '25 25 30 ' 32 30   ACIDBASEDEF 0.0 - 2.0 mmol/L 2.0 2.0 - - -   O2SAT % 96.0 96.0 - 98.0 -       ADL UCSD:     Pulmonary Assessment Scores    Row Name 05/30/17 1128         ADL UCSD   ADL Phase Entry     SOB Score total 38     Rest 0     Walk 1     Stairs 4     Bath 1     Dress 1     Shop 2       CAT Score   CAT Score 22       mMRC Score   mMRC Score 1        Pulmonary Function Assessment:     Pulmonary Function Assessment - 05/30/17 1142      Pulmonary Function Tests   FVC% 108 %   FEV1% 39 %   FEV1/FVC Ratio 28   RV% 153 %   DLCO% 59 %     Breath   Bilateral Breath Sounds Clear   Shortness of Breath Yes;Limiting activity      Exercise Target Goals: Date: 05/30/17  Exercise Program Goal: Individual exercise prescription set with THRR, safety & activity barriers. Participant  demonstrates ability to understand and report RPE using BORG scale, to self-measure pulse accurately, and to acknowledge the importance of the exercise prescription.  Exercise Prescription Goal: Starting with aerobic activity 30 plus minutes a day, 3 days per week for initial exercise prescription. Provide home exercise prescription and guidelines that participant acknowledges understanding prior to discharge.  Activity Barriers & Risk Stratification:     Activity Barriers & Cardiac Risk Stratification - 05/30/17 1247      Activity Barriers & Cardiac Risk Stratification   Activity Barriers Muscular Weakness;Shortness of Breath      6 Minute Walk:     6 Minute Walk    Row Name 03/30/17 1528 05/24/17 0835 05/24/17 0839     6 Minute Walk   Phase  - Discharge  -   Distance 810 feet 1370 feet  -   Distance % Change  - 69.1 %  560 ft  -   Walk Time 4.5 minutes 6 minutes  -   # of Rest Breaks '2 1  17 ' sec  -   MPH 2.04 2.69  -   METS 2.6 3.32  -   RPE 17 13  -   Perceived Dyspnea  3 3  -   VO2 Peak 9.11 11.61  -   Symptoms No Yes (comment)  -   Comments  - SOB, started on Room Air but changed to 2L at 4 min after saturations had dropped to 86%.  -   Resting HR 106 bpm 95 bpm  -   Resting BP 102/56 124/60  -   Max Ex. HR 126 bpm 110 bpm  -   Max Ex. BP 162/60 154/70  -   2 Minute Post BP 112/58  -  -     Interval Oxygen   Interval Oxygen? Yes  -  -   Baseline Oxygen Saturation % 97 %  - 98 %   Baseline Liters of Oxygen 2 L  - 0 L  Room Air   1 Minute Oxygen Saturation % 96 %  -  -  1 Minute Liters of Oxygen 2 L  -  -   2 Minute Oxygen Saturation % 94 %  -  -   2 Minute Liters of Oxygen 2 L  -  -   3 Minute Liters of Oxygen 2 L  -  -   4 Minute Oxygen Saturation % 93 %  - 86 %   4 Minute Liters of Oxygen 2 L  - 0 L   5 Minute Liters of Oxygen 2 L  -  -   6 Minute Oxygen Saturation % 94 %  - 92 %   6 Minute Liters of Oxygen 2 L  - 2 L   2 Minute Post Liters of Oxygen 2  L  -  -   Row Name 05/30/17 1243         6 Minute Walk   Phase Initial     Distance 340 feet     Walk Time 1.75 minutes     # of Rest Breaks 2  2:11 and 2:06     MPH 2.25     METS 1.42     RPE 15     Perceived Dyspnea  4     VO2 Peak 4.98     Symptoms Yes (comment)     Comments SOB, fatigue, tight breathing, felt like coming down with cold.  Oxygen saturations dropped once seated both time.     Resting HR 88 bpm     Resting BP 134/70     Max Ex. HR 99 bpm     Max Ex. BP 150/68     2 Minute Post BP 126/64       Interval HR   Baseline HR 88     1 Minute HR 95     2 Minute HR 86     3 Minute HR 89     4 Minute HR 99     5 Minute HR 87     6 Minute HR 86     2 Minute Post HR 88     Interval Heart Rate? Yes       Interval Oxygen   Interval Oxygen? Yes     Baseline Oxygen Saturation % 91 %     Baseline Liters of Oxygen 2 L     1 Minute Oxygen Saturation % 93 %     1 Minute Liters of Oxygen 2 L     2 Minute Oxygen Saturation % 84 %  seated rest from 0:48-2:59     2 Minute Liters of Oxygen 2 L     3 Minute Oxygen Saturation % 91 %     3 Minute Liters of Oxygen 2 L     4 Minute Oxygen Saturation % 92 %  seated stopped at 3:54     4 Minute Liters of Oxygen 2 L     5 Minute Oxygen Saturation % 85 %     5 Minute Liters of Oxygen 2 L     6 Minute Oxygen Saturation % 91 %     6 Minute Liters of Oxygen 2 L     2 Minute Post Oxygen Saturation % 95 %     2 Minute Post Liters of Oxygen 2 L       Oxygen Initial Assessment:     Oxygen Initial Assessment - 05/30/17 1139      Home Oxygen   Home Oxygen Device Home Concentrator;E-Tanks   Sleep Oxygen Prescription None  Home Exercise Oxygen Prescription Continuous   Liters per minute 2   Home at Rest Exercise Oxygen Prescription None  unless short of breath   Compliance with Home Oxygen Use Yes     Program Oxygen Prescription   Program Oxygen Prescription Continuous   Liters per minute 2     Intervention   Short  Term Goals To learn and exhibit compliance with exercise, home and travel O2 prescription;To learn and understand importance of monitoring SPO2 with pulse oximeter and demonstrate accurate use of the pulse oximeter.;To Learn and understand importance of maintaining oxygen saturations>88%;To learn and demonstrate proper purse lipped breathing techniques or other breathing techniques.;To learn and demonstrate proper use of respiratory medications   Long  Term Goals Exhibits compliance with exercise, home and travel O2 prescription;Maintenance of O2 saturations>88%;Compliance with respiratory medication;Demonstrates proper use of MDI's;Exhibits proper breathing techniques, such as purse lipped breathing or other method taught during program session;Verbalizes importance of monitoring SPO2 with pulse oximeter and return demonstration      Oxygen Re-Evaluation:     Oxygen Re-Evaluation    Row Name 04/07/17 0856 04/21/17 0841 05/17/17 0901         Program Oxygen Prescription   Program Oxygen Prescription Continuous Continuous Continuous  on treadmill mainly, trying to come off for seated equipment     Liters per minute 2  - 2     Comments  - Thomas Trujillo arrives on 2 liters of oxgyen that he reports he has had since surgery this year. Thomas Trujillo said he didn't use his oxgyen to sleep last night. He reported that he shut off his oxgyen for a few minutes while riding the bicycle in Cardiac Rehab today.  -       Home Oxygen   Home Oxygen Device Home Concentrator;E-Tanks  - Home Concentrator;E-Tanks     Sleep Oxygen Prescription None  - None     Home Exercise Oxygen Prescription Continuous Continuous Continuous  only with activity     Liters per minute '2 2 2     ' Home at Rest Exercise Oxygen Prescription None  - None     Compliance with Home Oxygen Use Yes Yes Yes       Goals/Expected Outcomes   Short Term Goals  - To learn and exhibit compliance with exercise, home and travel O2 prescription;To learn and  understand importance of monitoring SPO2 with pulse oximeter and demonstrate accurate use of the pulse oximeter. To learn and exhibit compliance with exercise, home and travel O2 prescription;To learn and understand importance of monitoring SPO2 with pulse oximeter and demonstrate accurate use of the pulse oximeter.;To Learn and understand importance of maintaining oxygen saturations>88%     Long  Term Goals  -  - Exhibits compliance with exercise, home and travel O2 prescription;Verbalizes importance of monitoring SPO2 with pulse oximeter and return demonstration;Maintenance of O2 saturations>88%;Exhibits proper breathing techniques, such as purse lipped breathing or other method taught during program session     Comments Thomas Trujillo used our oxgyen tank on 2liters/minute during Cardiac Rehab then switched to his home oxgyen when he left to go home.   - [place] is trying to use less oxygen for exercise. He really wants to come off his oxygen altogether.  He is no longer using it at rest and just for exertion, but it is starting to get better.  His oxygen saturations have been good on room air.      Goals/Expected Outcomes  -  - Short: Start to exercise  more without his oxygen and reduce amount used on treadmill.  Long: Come off oxygen completely.        Oxygen Discharge (Final Oxygen Re-Evaluation):     Oxygen Re-Evaluation - 05/17/17 0901      Program Oxygen Prescription   Program Oxygen Prescription Continuous  on treadmill mainly, trying to come off for seated equipment   Liters per minute 2     Home Oxygen   Home Oxygen Device Home Concentrator;E-Tanks   Sleep Oxygen Prescription None   Home Exercise Oxygen Prescription Continuous  only with activity   Liters per minute 2   Home at Rest Exercise Oxygen Prescription None   Compliance with Home Oxygen Use Yes     Goals/Expected Outcomes   Short Term Goals To learn and exhibit compliance with exercise, home and travel O2 prescription;To learn  and understand importance of monitoring SPO2 with pulse oximeter and demonstrate accurate use of the pulse oximeter.;To Learn and understand importance of maintaining oxygen saturations>88%   Long  Term Goals Exhibits compliance with exercise, home and travel O2 prescription;Verbalizes importance of monitoring SPO2 with pulse oximeter and return demonstration;Maintenance of O2 saturations>88%;Exhibits proper breathing techniques, such as purse lipped breathing or other method taught during program session   Comments [place] is trying to use less oxygen for exercise. He really wants to come off his oxygen altogether.  He is no longer using it at rest and just for exertion, but it is starting to get better.  His oxygen saturations have been good on room air.    Goals/Expected Outcomes Short: Start to exercise more without his oxygen and reduce amount used on treadmill.  Long: Come off oxygen completely.      Initial Exercise Prescription:     Initial Exercise Prescription - 05/30/17 1200      Date of Initial Exercise RX and Referring Provider   Date 05/30/17   Referring Provider Ancil Linsey MD     Oxygen   Oxygen Continuous  Primarily on treadmill   Liters 2     Treadmill   MPH 2   Grade 1   Minutes 15   METs 2.81     NuStep   Level 4   SPM 80   Minutes 15   METs 2.8     Arm Ergometer   Level 3   RPM 35   Minutes 15   METs 2.8     Resistance Training   Training Prescription Yes   Weight 3 lb   Reps 10-15      Perform Capillary Blood Glucose checks as needed.  Exercise Prescription Changes:     Exercise Prescription Changes    Row Name 03/30/17 1500 04/05/17 1600 04/12/17 0900 04/18/17 1200 05/02/17 1500     Response to Exercise   Blood Pressure (Admit) 102/56 122/50  - 108/56 130/70   Blood Pressure (Exercise) 162/60 116/50  - 138/78 136/72   Blood Pressure (Exit) 112/58 118/64  - 138/70 122/70   Heart Rate (Admit) 109 bpm 102 bpm  - 90 bpm 80 bpm   Heart  Rate (Exercise) 129 bpm 116 bpm  - 117 bpm 115 bpm   Heart Rate (Exit) 111 bpm 102 bpm  - 84 bpm 103 bpm   Oxygen Saturation (Admit) 96 % 97 %  -  -  -   Oxygen Saturation (Exercise) 94 %  -  -  -  -   Rating of Perceived Exertion (Exercise)  - 12  - 13  13   Symptoms  - none none none none   Duration  - Progress to 45 minutes of aerobic exercise without signs/symptoms of physical distress Progress to 45 minutes of aerobic exercise without signs/symptoms of physical distress Continue with 45 min of aerobic exercise without signs/symptoms of physical distress. Continue with 45 min of aerobic exercise without signs/symptoms of physical distress.   Intensity  - THRR unchanged THRR unchanged THRR unchanged THRR unchanged     Progression   Progression  - Continue to progress workloads to maintain intensity without signs/symptoms of physical distress. Continue to progress workloads to maintain intensity without signs/symptoms of physical distress. Continue to progress workloads to maintain intensity without signs/symptoms of physical distress. Continue to progress workloads to maintain intensity without signs/symptoms of physical distress.   Average METs  - 2.5 2.5 2.55 2.6     Resistance Training   Training Prescription  - Yes Yes Yes Yes   Weight  - 3 lbs 3 lbs 3 lbs 3 lb   Reps  - 10-15 10-15 10-15 10-15     Interval Training   Interval Training  - No No No No     Oxygen   Oxygen  -  -  -  - Continuous   Liters  -  -  -  - 2     Treadmill   MPH  -  -  - 2  -   Grade  -  -  - 0.5  -   Minutes  -  -  - 15  -   METs  -  -  - 2.67  -     Recumbant Bike   Level  - '1 1 4 4   ' Watts  - '13 13 16 13   ' Minutes  - '15 15 15 15   ' METs  - 2.59 2.59 2.67 2.59     NuStep   Level  - '2 2 2 4   ' Minutes  - '15 15 15 15   ' METs  - 2.5 2.5 2.3 2.6     Home Exercise Plan   Plans to continue exercise at  -  - Home (comment)  walking Home (comment)  walking  -   Frequency  -  - Add 2 additional days to  program exercise sessions. Add 2 additional days to program exercise sessions.  -   Initial Home Exercises Provided  -  - 04/12/17 04/12/17  -   Thomas Trujillo Name 05/18/17 1100 05/30/17 1200           Response to Exercise   Blood Pressure (Admit) 110/58 134/70      Blood Pressure (Exercise) 132/60 150/68      Blood Pressure (Exit) 126/70 126/64      Heart Rate (Admit) 96 bpm 88 bpm      Heart Rate (Exercise) 105 bpm 99 bpm      Heart Rate (Exit) 77 bpm 88 bpm      Rating of Perceived Exertion (Exercise) 12 15      Perceived Dyspnea (Exercise)  - 4      Symptoms none SOB      Comments  - walk test results      Duration Continue with 45 min of aerobic exercise without signs/symptoms of physical distress.  -      Intensity THRR unchanged  -        Progression   Progression Continue to progress workloads to maintain intensity without signs/symptoms of physical  distress.  -      Average METs 2.98  -        Resistance Training   Training Prescription Yes  -      Weight 3 lb  -      Reps 10-15  -        Interval Training   Interval Training No  -        Oxygen   Oxygen Continuous  only on treadmill  -      Liters 2  -        Treadmill   MPH 2  -      Grade 1  -      Minutes 15  -      METs 2.81  -        Recumbant Bike   Level 4  -      Watts 27  -      Minutes 15  -      METs 3.35  -        NuStep   Level 4  -      Minutes 15  -      METs 2.8  -        Home Exercise Plan   Plans to continue exercise at Home (comment)  walking  -      Frequency Add 2 additional days to program exercise sessions.  -      Initial Home Exercises Provided 04/12/17  -         Exercise Comments:     Exercise Comments    Row Name 04/03/17 0912 04/05/17 0956 04/19/17 0810 05/29/17 0758     Exercise Comments First full day of exercise!  Patient was oriented to gym and equipment including functions, settings, policies, and procedures.  Patient's individual exercise prescription and  treatment plan were reviewed.  All starting workloads were established based on the results of the 6 minute walk test done at initial orientation visit.  The plan for exercise progression was also introduced and progression will be customized based on patient's performance and goals Thomas Trujillo completed an abbreviated exercise session due to  Dr appointment. Reviewed METs average and discussed progression with pt today.  Thomas Trujillo graduated today from cardiac rehab with 36 sessions completed.  Details of the patient's exercise prescription and what He needs to do in order to continue the prescription and progress were discussed with patient.  Patient was given a copy of prescription and goals.  Patient verbalized understanding.  Thomas Trujillo plans to continue to exercise by walking at home.       Exercise Goals and Review:     Exercise Goals    Row Name 03/30/17 1528 05/30/17 1251           Exercise Goals   Increase Physical Activity Yes Yes      Intervention Provide advice, education, support and counseling about physical activity/exercise needs.;Develop an individualized exercise prescription for aerobic and resistive training based on initial evaluation findings, risk stratification, comorbidities and participant's personal goals. Provide advice, education, support and counseling about physical activity/exercise needs.;Develop an individualized exercise prescription for aerobic and resistive training based on initial evaluation findings, risk stratification, comorbidities and participant's personal goals.      Expected Outcomes Achievement of increased cardiorespiratory fitness and enhanced flexibility, muscular endurance and strength shown through measurements of functional capacity and personal statement of participant. Achievement of increased cardiorespiratory fitness and enhanced flexibility, muscular endurance and strength shown through  measurements of functional capacity and personal statement of participant.       Increase Strength and Stamina Yes Yes      Intervention Provide advice, education, support and counseling about physical activity/exercise needs.;Develop an individualized exercise prescription for aerobic and resistive training based on initial evaluation findings, risk stratification, comorbidities and participant's personal goals. Provide advice, education, support and counseling about physical activity/exercise needs.;Develop an individualized exercise prescription for aerobic and resistive training based on initial evaluation findings, risk stratification, comorbidities and participant's personal goals.      Expected Outcomes Achievement of increased cardiorespiratory fitness and enhanced flexibility, muscular endurance and strength shown through measurements of functional capacity and personal statement of participant. Achievement of increased cardiorespiratory fitness and enhanced flexibility, muscular endurance and strength shown through measurements of functional capacity and personal statement of participant.         Exercise Goals Re-Evaluation :     Exercise Goals Re-Evaluation    Row Name 04/05/17 1600 04/12/17 0934 04/18/17 1157 05/02/17 1549 05/17/17 0851     Exercise Goal Re-Evaluation   Exercise Goals Review Increase Physical Activity;Increase Strenth and Stamina Increase Physical Activity;Increase Strenth and Stamina Increase Physical Activity;Increase Strenth and Stamina Increase Physical Activity;Increase Strenth and Stamina Increase Physical Activity;Increase Strenth and Stamina   Comments Thomas Trujillo has completed two exercise days with rehab so far.  He is off to good a start.  We will continue to monitor his progression. Reviewed home exercise with pt today.  Pt plans to walk at home for exercise.  Reviewed THR, pulse, RPE, sign and symptoms, and when to call 911 or MD.  Also discussed weather considerations and indoor options.  Pt voiced understanding. Thomas Trujillo is doing well in  rehab.  He is now up to 2.0 mph on the treadmill.  We will continue to monitor his progression. Thomas Trujillo has increased his resistnace on the NuStep.  Staff will continue to monitor progression. Thomas Trujillo has been doing well in rehab.  He has more strength and stamina and is able to do more at home.  He has also come off his oxygen except for exercise.  Today, he tried the stepper on RA and mainated 88-90%.  The treadmill is still his hardest piece with the weight bearing.   Expected Outcomes Continue to come to classes to work on strength and stamina. Short: Add in at least one day at home.  Long: Make exercise part of routine Short: Increase work load on NuStep.  Long: Continue to exercise regularly. Short - Continue regular attendance Long - Continue to improve overall functional fitness. Short: Continue to exercise at home and try to exercise off oxygen.  Long: Continue to exercise independently.   [place] Name 05/24/17 0836             Exercise Goal Re-Evaluation   Exercise Goals Review Increase Physical Activity;Increase Strenth and Stamina       Comments Thomas Trujillo improved his walk test by 560 ft!         Expected Outcomes Short: Graduate  Long: Continue to exercise indpendent          Discharge Exercise Prescription (Final Exercise Prescription Changes):     Exercise Prescription Changes - 05/30/17 1200      Response to Exercise   Blood Pressure (Admit) 134/70   Blood Pressure (Exercise) 150/68   Blood Pressure (Exit) 126/64   Heart Rate (Admit) 88 bpm   Heart Rate (Exercise) 99 bpm   Heart Rate (Exit) 88 bpm  Rating of Perceived Exertion (Exercise) 15   Perceived Dyspnea (Exercise) 4   Symptoms SOB   Comments walk test results      Nutrition:  Target Goals: Understanding of nutrition guidelines, daily intake of sodium <1526m, cholesterol <2019m calories 30% from fat and 7% or less from saturated fats, daily to have 5 or more servings of fruits and vegetables.  Biometrics:     Pre  Biometrics - 05/30/17 1251      Pre Biometrics   Height 5' 4.6" (1.641 m)   Weight 150 lb 12.8 oz (68.4 kg)   Waist Circumference 35.5 inches   Hip Circumference 37 inches   Waist to Hip Ratio 0.96 %   BMI (Calculated) 25.5         Post Biometrics - 05/29/17 0835       Post  Biometrics   Height '5\' 4"'  (1.626 m)   Weight 152 lb 8 oz (69.2 kg)   Waist Circumference 34 inches   Hip Circumference 36 inches   Waist to Hip Ratio 0.94 %   BMI (Calculated) 26.2   Single Leg Stand 30 seconds      Nutrition Therapy Plan and Nutrition Goals:     Nutrition Therapy & Goals - 05/30/17 1250      Nutrition Therapy   RD appointment defered Yes     Intervention Plan   Intervention Prescribe, educate and counsel regarding individualized specific dietary modifications aiming towards targeted core components such as weight, hypertension, lipid management, diabetes, heart failure and other comorbidities.;Nutrition handout(s) given to patient.   Expected Outcomes Short Term Goal: Understand basic principles of dietary content, such as calories, fat, sodium, cholesterol and nutrients.;Short Term Goal: A plan has been developed with personal nutrition goals set during dietitian appointment.;Long Term Goal: Adherence to prescribed nutrition plan.      Nutrition Discharge: Rate Your Plate Scores:     Nutrition Assessments - 05/22/17 0851      MEDFICTS Scores   Pre Score 6   Post Score 6   Score Difference 0      Nutrition Goals Re-Evaluation:     Nutrition Goals Re-Evaluation    Row Name 04/21/17 0842 05/17/17 0900           Goals   Nutrition Goal  - continue with current heart healthy eating pattern, use menus      Comment When I asked JoLemmieow his appt went with the Cardiac REhab REgistered Dietician he said it was very helpful. Keimon reports that "he has a peFacilities managerrom his church that cooks his lunch and dinner for him".  JoAnthonymichaelas a peFacilities managerrom church to prepare his  meals.  They have continued  to focus on heart healthy eating. He feels he is getting a balanced diet.       Expected Outcome Heart Healthy eating plus eating for his COPD Short: Continue to eat balanced diets.  Long: Continue to eat heart healthy diet.         Nutrition Goals Discharge (Final Nutrition Goals Re-Evaluation):     Nutrition Goals Re-Evaluation - 05/17/17 0900      Goals   Nutrition Goal continue with current heart healthy eating pattern, use menus   Comment JoQuentezas a peFacilities managerrom church to prepare his meals.  They have continued  to focus on heart healthy eating. He feels he is getting a balanced diet.    Expected Outcome Short: Continue to eat balanced diets.  Long:  Continue to eat heart healthy diet.      Psychosocial: Target Goals: Acknowledge presence or absence of significant depression and/or stress, maximize coping skills, provide positive support system. Participant is able to verbalize types and ability to use techniques and skills needed for reducing stress and depression.   Initial Review & Psychosocial Screening:     Initial Psych Review & Screening - 05/30/17 1139      Initial Review   Current issues with Current Sleep Concerns     Family Dynamics   Good Support System? Yes      Quality of Life Scores:     Quality of Life - 05/22/17 0851      Quality of Life Scores   Health/Function Pre 23.46 %   Health/Function Post 24.46 %   Health/Function % Change 4.26 %   Socioeconomic Pre 29 %   Socioeconomic Post 27.5 %   Socioeconomic % Change  -5.17 %   Psych/Spiritual Pre 30 %   Psych/Spiritual Post 28.29 %   Psych/Spiritual % Change -5.7 %   Family Pre 28.5 %   Family Post 28.5 %   Family % Change 0 %   GLOBAL Pre 26.58 %   GLOBAL Post 26.5 %   GLOBAL % Change -0.3 %      PHQ-9: Recent Review Flowsheet Data    Depression screen [facility] 2/9 05/30/2017 05/22/2017 03/30/2017   Decreased Interest 0 0 2   Down, Depressed, Hopeless 0 0 0    PHQ - 2 Score 0 0 2   Altered sleeping 1 0 3    Tired, decreased energy 0 0 3    Change in appetite 0 0 1    Feeling bad or failure about yourself  0 0 0   Trouble concentrating 0 0 0   Moving slowly or fidgety/restless 0 0 0   Suicidal thoughts 0 0 0   PHQ-9 Score 1 0 9   Difficult doing work/chores Not difficult at all Not difficult at all Not difficult at all     Interpretation of Total Score  Total Score Depression Severity:  1-4 = Minimal depression, 5-9 = Mild depression, 10-14 = Moderate depression, 15-19 = Moderately severe depression, 20-27 = Severe depression   Psychosocial Evaluation and Intervention:     Psychosocial Evaluation - 05/15/17 1019      Psychosocial Evaluation & Interventions   Interventions Encouraged to exercise with the program and follow exercise prescription;Relaxation education   Comments Counselor met with Mr. Cranston today Jenny Reichmann) for initial psychosocial evaluation.  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  Thomas Trujillo has a son who lives close by who is his primary support as Thomas Trujillo moved from [place] approx. 1.5 years ago.  He has a good appetite and sleeps better now since coming into this program.  Carel is typically in a good mood and denies a history of depression or anxiety or any current symptoms.  He has goals to get off the O2  while in this program and he has been able to sleep some without it at night already.  He has also learned a lot while in this program and reports his energy has increased as well. Thomas Trujillo has plans to work out at a fitness facility upon completion of this program.    Expected Outcomes Thomas Trujillo will benefit from consistent exercise to achieve his stated goals.  He will  also benefit from the educational components of this program to help understand and manage his current cardiac and pulmonary diagnoses.    Continue Psychosocial Services  Follow up required by staff       Psychosocial Re-Evaluation:     Psychosocial Re-Evaluation    [place] Name 04/21/17 [phone]             Psychosocial Re-Evaluation   Current issues with Current Sleep Concerns       Comments Thomas Trujillo reports that last night he didn't use his oxgyen and he did well. Thomas Trujillo said that he has a Facilities manager from his church cook his lunch and dinner for him and he makes his own breakfast. Thomas Trujillo's son has bought him to Cardiac REhab since he started.        Expected Outcomes Thomas Trujillo -heart healthy lifestyle.       Interventions Encouraged to attend Cardiac Rehabilitation for the exercise       Continue Psychosocial Services  Follow up required by staff          Psychosocial Discharge (Final Psychosocial Re-Evaluation):     Psychosocial Re-Evaluation - 04/21/17 0851      Psychosocial Re-Evaluation   Current issues with Current Sleep Concerns   Comments Thomas Trujillo reports that last night he didn't use his oxgyen and he did well. Thomas Trujillo said that he has a Facilities manager from his church cook his lunch and dinner for him and he makes his own breakfast. Thomas Trujillo's son has bought him to Cardiac REhab since he started.    Expected Outcomes Deryk -heart healthy lifestyle.   Interventions Encouraged to attend Cardiac Rehabilitation for the exercise   Continue Psychosocial Services  Follow up required by staff      Education: Education Goals: Education classes will be provided on a weekly basis, covering required topics. Participant will state understanding/return demonstration of topics presented.  Learning Barriers/Preferences:     Learning Barriers/Preferences - 03/30/17 1552      Learning Barriers/Preferences   Learning Barriers None   Learning Preferences Skilled Demonstration;Pictoral;Individual Instruction;Written Material;Video      Education Topics: Initial Evaluation Education: - Verbal, written and demonstration of respiratory meds, RPE/PD scales, oximetry and breathing techniques. Instruction on  use of nebulizers and MDIs: cleaning and proper use, rinsing mouth with steroid doses and importance of monitoring MDI activations.   Pulmonary Rehab from 05/30/2017 in [facility] Cardiac and Pulmonary Rehab  Date  05/30/17  Educator  [facility]  Instruction Review Code  2- meets goals/outcomes      General Nutrition Guidelines/Fats and Fiber: -Group instruction provided by verbal, written material, models and posters to present the general guidelines for heart healthy nutrition. Gives an explanation and review of dietary fats and fiber.   Cardiac Rehab from 05/29/2017 in [facility] Cardiac and Pulmonary Rehab  Date  04/17/17  Educator  CR  Instruction Review Code  2- meets goals/outcomes      Controlling Sodium/Reading Food Labels: -Group verbal and written material supporting the discussion of sodium use in heart healthy nutrition. Review and explanation with models, verbal and written materials for utilization of the food label.   Cardiac Rehab from 05/29/2017 in [facility] Cardiac and Pulmonary Rehab  Date  04/24/17  Educator  CR  Instruction Review Code  2- meets goals/outcomes      Exercise Physiology & Risk Factors: - Group verbal and written instruction with models to review the exercise physiology of the cardiovascular system and associated critical values. Details cardiovascular disease risk factors  and the goals associated with each risk factor.   Cardiac Rehab from 05/29/2017 in [facility] Cardiac and Pulmonary Rehab  Date  05/01/17  Educator  [facility]  Instruction Review Code  2- meets goals/outcomes      Aerobic Exercise & Resistance Training: - Gives group verbal and written discussion on the health impact of inactivity. On the components of aerobic and resistive training programs and the benefits of this training and how to safely progress through these programs.   Cardiac Rehab from 05/29/2017 in [facility] Cardiac and Pulmonary Rehab  Date  05/03/17  Educator  SB  Instruction Review Code  2- meets goals/outcomes       Flexibility, Balance, General Exercise Guidelines: - Provides group verbal and written instruction on the benefits of flexibility and balance training programs. Provides general exercise guidelines with specific guidelines to those with heart or lung disease. Demonstration and skill practice provided.   Cardiac Rehab from 05/29/2017 in [facility] Cardiac and Pulmonary Rehab  Date  05/08/17  Educator  [facility]  Instruction Review Code  2- meets goals/outcomes      Stress Management: - Provides group verbal and written instruction about the health risks of elevated stress, cause of high stress, and healthy ways to reduce stress.   Cardiac Rehab from 05/29/2017 in [facility] Cardiac and Pulmonary Rehab  Date  05/17/17  Educator  [facility]  Instruction Review Code  2- meets goals/outcomes      Depression: - Provides group verbal and written instruction on the correlation between heart/lung disease and depressed mood, treatment options, and the stigmas associated with seeking treatment.   Cardiac Rehab from 05/29/2017 in [facility] Cardiac and Pulmonary Rehab  Date  04/19/17  Educator  [facility]  Instruction Review Code  2- meets goals/outcomes      Exercise & Equipment Safety: - Individual verbal instruction and demonstration of equipment use and safety with use of the equipment.   Pulmonary Rehab from 05/30/2017 in [facility] Cardiac and Pulmonary Rehab  Date  05/30/17  Educator  [facility]  Instruction Review Code  2- meets goals/outcomes      Infection Prevention: - Provides verbal and written material to individual with discussion of infection control including proper hand washing and proper equipment cleaning during exercise session.   Pulmonary Rehab from 05/30/2017 in [facility] Cardiac and Pulmonary Rehab  Date  05/30/17  Educator  [facility]  Instruction Review Code  2- meets goals/outcomes      Falls Prevention: - Provides verbal and written material to individual with discussion of falls prevention and safety.   Pulmonary Rehab  from 05/30/2017 in [facility] Cardiac and Pulmonary Rehab  Date  05/30/17  Educator  [place]  Instruction Review Code  2- meets goals/outcomes      Diabetes: - Individual verbal and written instruction to review signs/symptoms of diabetes, desired ranges of glucose level fasting, after meals and with exercise. Advice that pre and post exercise glucose checks will be done for 3 sessions at entry of program.   Chronic Lung Diseases: - Group verbal and written instruction to review new updates, new respiratory medications, new advancements in procedures and treatments. Provide informative websites and "800" numbers of self-education.   Lung Procedures: - Group verbal and written instruction to describe testing methods done to diagnose lung disease. Review the outcome of test results. Describe the treatment choices: Pulmonary Function Tests, ABGs and oximetry.   Energy Conservation: - Provide group verbal and written instruction for methods to conserve energy, plan and organize activities. Instruct on  pacing techniques, use of adaptive equipment and posture/positioning to relieve shortness of breath.   Triggers: - Group verbal and written instruction to review types of environmental controls: home humidity, furnaces, filters, dust mite/pet prevention, HEPA vacuums. To discuss weather changes, air quality and the benefits of nasal washing.   Exacerbations: - Group verbal and written instruction to provide: warning signs, infection symptoms, calling MD promptly, preventive modes, and value of vaccinations. Review: effective airway clearance, coughing and/or vibration techniques. Create an Sports administrator.   Oxygen: - Individual and group verbal and written instruction on oxygen therapy. Includes supplement oxygen, available portable oxygen systems, continuous and intermittent flow rates, oxygen safety, concentrators, and Medicare reimbursement for oxygen.   Respiratory Medications: - Group verbal and  written instruction to review medications for lung disease. Drug class, frequency, complications, importance of spacers, rinsing mouth after steroid MDI's, and proper cleaning methods for nebulizers.   AED/CPR: - Group verbal and written instruction with the use of models to demonstrate the basic use of the AED with the basic ABC's of resuscitation.   Breathing Retraining: - Provides individuals verbal and written instruction on purpose, frequency, and proper technique of diaphragmatic breathing and pursed-lipped breathing. Applies individual practice skills.   Pulmonary Rehab from 05/30/2017 in [facility] Cardiac and Pulmonary Rehab  Date  05/30/17  Educator  [facility]  Instruction Review Code  2- meets goals/outcomes      Anatomy and Physiology of the Lungs: - Group verbal and written instruction with the use of models to provide basic lung anatomy and physiology related to function, structure and complications of lung disease.   Heart Failure: - Group verbal and written instruction on the basics of heart failure: signs/symptoms, treatments, explanation of ejection fraction, enlarged heart and cardiomyopathy.   Sleep Apnea: - Individual verbal and written instruction to review Obstructive Sleep Apnea. Review of risk factors, methods for diagnosing and types of masks and machines for OSA.   Anxiety: - Provides group, verbal and written instruction on the correlation between heart/lung disease and anxiety, treatment options, and management of anxiety.   Relaxation: - Provides group, verbal and written instruction about the benefits of relaxation for patients with heart/lung disease. Also provides patients with examples of relaxation techniques.   Knowledge Questionnaire Score:     Knowledge Questionnaire Score - 05/30/17 1134      Knowledge Questionnaire Score   Pre Score 9/10       Core Components/Risk Factors/Patient Goals at Admission:     Personal Goals and Risk Factors at  Admission - 05/30/17 1126      Core Components/Risk Factors/Patient Goals on Admission    Weight Management Yes   Intervention Weight Management: Develop a combined nutrition and exercise program designed to reach desired caloric intake, while maintaining appropriate intake of nutrient and fiber, sodium and fats, and appropriate energy expenditure required for the weight goal.;Weight Management: Provide education and appropriate resources to help participant work on and attain dietary goals.;Weight Management/Obesity: Establish reasonable short term and long term weight goals.   Admit Weight 150 lb (68 kg)   Goal Weight: Short Term 150 lb (68 kg)   Goal Weight: Long Term 150 lb (68 kg)  wants to maintain his current weight   Expected Outcomes Short Term: Continue to assess and modify interventions until short term weight is achieved;Long Term: Adherence to nutrition and physical activity/exercise program aimed toward attainment of established weight goal;Weight Maintenance: Understanding of the daily nutrition guidelines, which includes 25-35% calories from fat, 7%  or less cal from saturated fats, less than 211m cholesterol, less than 1.5gm of sodium, & 5 or more servings of fruits and vegetables daily;Understanding recommendations for meals to include 15-35% energy as protein, 25-35% energy from fat, 35-60% energy from carbohydrates, less than 2065mof dietary cholesterol, 20-35 gm of total fiber daily;Understanding of distribution of calorie intake throughout the day with the consumption of 4-5 meals/snacks   Improve shortness of breath with ADL's Yes   Intervention Provide education, individualized exercise plan and daily activity instruction to help decrease symptoms of SOB with activities of daily living.   Expected Outcomes Short Term: Achieves a reduction of symptoms when performing activities of daily living.   Develop more efficient breathing techniques such as purse lipped breathing and  diaphragmatic breathing; and practicing self-pacing with activity Yes   Intervention Provide education, demonstration and support about specific breathing techniuqes utilized for more efficient breathing. Include techniques such as pursed lipped breathing, diaphragmatic breathing and self-pacing activity.   Expected Outcomes Short Term: Participant will be able to demonstrate and use breathing techniques as needed throughout daily activities.   Increase knowledge of respiratory medications and ability to use respiratory devices properly  Yes   Intervention Provide education and demonstration as needed of appropriate use of medications, inhalers, and oxygen therapy.   Expected Outcomes Short Term: Achieves understanding of medications use. Understands that oxygen is a medication prescribed by physician. Demonstrates appropriate use of inhaler and oxygen therapy.   Heart Failure Yes   Intervention Provide a combined exercise and nutrition program that is supplemented with education, support and counseling about heart failure. Directed toward relieving symptoms such as shortness of breath, decreased exercise tolerance, and extremity edema.   Expected Outcomes Improve functional capacity of life;Short term: Attendance in program 2-3 days a week with increased exercise capacity. Reported lower sodium intake. Reported increased fruit and vegetable intake. Reports medication compliance.;Short term: Daily weights obtained and reported for increase. Utilizing diuretic protocols set by physician.;Long term: Adoption of self-care skills and reduction of barriers for early signs and symptoms recognition and intervention leading to self-care maintenance.   Lipids Yes   Intervention Provide education and support for participant on nutrition & aerobic/resistive exercise along with prescribed medications to achieve LDL <7035mHDL >75m39m Expected Outcomes Short Term: Participant states understanding of desired  cholesterol values and is compliant with medications prescribed. Participant is following exercise prescription and nutrition guidelines.;Long Term: Cholesterol controlled with medications as prescribed, with individualized exercise RX and with personalized nutrition plan. Value goals: LDL < 70mg56mL > 40 mg.   Personal Goal Other Yes   Personal Goal Increase upper body strength   Expected Outcomes Short: start pulmonary rehab. Long: To have increased upper body strength to increase his ADL      Core Components/Risk Factors/Patient Goals Review:      Goals and Risk Factor Review    Row Name 04/21/17 0849 05/17/17 0856           Core Components/Risk Factors/Patient Goals Review   Personal Goals Review Lipids Lipids;Weight Management/Obesity;Improve shortness of breath with ADL's      Review Torrez Khyrenrts that his "personal chef has all the heart healthy rules and diets and he cooks all my lunches and dinners for me". Claus reports that he makes his own breakfast and eats healty. Denzil gets his lipids checked by his MD.  Saif Hayzennot had any problems with his medications.  He is able to do more at home  now without getting as short of breath.  He is trying to come off of his oxygen some.  His weight has been steady.      Expected Outcomes Heart healthy living will using 2 liters of oxgyen currently since his surgeyr. Short: Continue to work on improving his SOB with activities.  Long: Continue to work on risk factor modifications.         Core Components/Risk Factors/Patient Goals at Discharge (Final Review):      Goals and Risk Factor Review - 05/17/17 0856      Core Components/Risk Factors/Patient Goals Review   Personal Goals Review Lipids;Weight Management/Obesity;Improve shortness of breath with ADL's   Review Akia has not had any problems with his medications.  He is able to do more at home now without getting as short of breath.  He is trying to come off of his oxygen some.  His  weight has been steady.   Expected Outcomes Short: Continue to work on improving his SOB with activities.  Long: Continue to work on risk factor modifications.      ITP Comments:     ITP Comments    Row Name 03/30/17 1537 04/07/17 0857 04/12/17 0648 04/21/17 0836 05/10/17 3007   ITP Comments Medical review completed, INitial ITP created   Documentation of Diagnosis can be found in [facility] 5/4 admission Uses oxgyen 2 liters/minute.  30 day review. Continue with ITP unless directed changes per Medical Director review. New to program Ithiel arrives on 2 liters of oxgyen that he reports he has had since surgery this year. Daelyn said he didn't use his oxgyen to sleep last night. He reported that he shut off his oxgyen for a few minutes while riding the bicycle in Cardiac Rehab today.  30 day review. Continue with ITP unless directed changes per Medical Director review      [place] Name 05/30/17 1244           ITP Comments Met with Dr. Sabra Heck director of [place] for face to face. Chart reviewed and signed. Initial ITP sent. Diagnosis can be found in [facility] encounter 05/30/2017          Comments: Initial ITP

## 2017-05-30 NOTE — ED Notes (Signed)
Pt being transported by EDT [employer]

## 2017-05-30 NOTE — ED Notes (Signed)
Pt taken to CT.

## 2017-05-30 NOTE — H&P (Signed)
Patient ID: Thomas Trujillo, male   DOB: 13-Feb-1946, 71 y.o.   MRN: [id number]  HPI [name] is a 71 y.o. male was emergency room after seeing seen by Dr. Raul Del for shortness of breath. Patient reports that the shortness of breath that started last night. He has some chest discomfort but no pain. He had a known history of COPD and was on home oxygen at 2 couple weeks ago where he was actually in cardiac rehabilitation and was only median home oxygen when walking. He quit smoking about 10 years ago but had at least a 40 year pack history. Chest x-ray and CT scan personal review there is evidence of about a 30% pneumothorax without evidence of tension. 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 Hemoglobin is 13 and creatinine is 1.1. Of note he had a recent MI requiring a CABG x 3  on April this year. Last echo was done on June 29 showing an ejection fraction of 55%. Of note he has had some dyspnea for the last several months   HPI  Past Medical History:  Diagnosis Date  . Bleb, lung ([place])   . Coronary artery disease 02/14/2017   NSTEMI with urgent CABG (LIMA-LAD and SVG->ramus)  . Hyperlipidemia   . Ischemic cardiomyopathy   . Patient denies medical problems     Past Surgical History:  Procedure Laterality Date  . ABDOMINAL HYSTERECTOMY    . CATARACT EXTRACTION, BILATERAL    . CORONARY ARTERY BYPASS GRAFT N/A 02/14/2017   Procedure: CORONARY ARTERY BYPASS GRAFTING (CABG) x 2 , using left internal mammary artery and right leg greater saphenous vein harvested endoscopically LIMA-LAD, SVG-RAMUS;  Surgeon: [name], MD;  Location: [facility];  Service: Open Heart Surgery;  Laterality: N/A;  . ENDOVEIN HARVEST OF GREATER SAPHENOUS VEIN Right 02/14/2017   Procedure: ENDOVEIN HARVEST OF RIGHT THIGH GREATER SAPHENOUS VEIN;  Surgeon: [name], MD;  Location: [facility];  Service: Open Heart Surgery;  Laterality: Right;  . LEFT HEART CATH AND CORONARY ANGIOGRAPHY N/A  02/14/2017   Procedure: Left Heart Cath and Coronary Angiography;  Surgeon: [name], MD;  Location: [facility];  Service: Cardiovascular;  Laterality: N/A;  . NASAL POLYP SURGERY    . STAPLING OF BLEBS N/A 02/14/2017   Procedure: STAPLING OF LARGE LEFT UPPER LOBE PULMONARY BLEB;  Surgeon: [name], MD;  Location: [facility];  Service: Open Heart Surgery;  Laterality: N/A;  . TEE WITHOUT CARDIOVERSION N/A 02/14/2017   Procedure: TRANSESOPHAGEAL ECHOCARDIOGRAM (TEE);  Surgeon: [name], MD;  Location: [facility];  Service: Open Heart Surgery;  Laterality: N/A;    Family History  Problem Relation Age of Onset  . Obesity Son     Social History Social History  Substance Use Topics  . Smoking status: Former Smoker    Quit date: 04/06/2007  . Smokeless tobacco: Never Used  . Alcohol use No    Allergies  Allergen Reactions  . No Known Allergies     Current Facility-Administered Medications  Medication Dose Route Frequency Provider Last Rate Last Dose  . acetaminophen (TYLENOL) tablet 1,000 mg  1,000 mg Oral Q6H Kayron Kalmar F, MD      . hydrALAZINE (APRESOLINE) injection 10 mg  10 mg Intravenous Q2H PRN Mical Brun F, MD      . ipratropium-albuterol (DUONEB) 0.5-2.5 (3) MG/3ML nebulizer solution 3 mL  3 mL Nebulization Q6H Matisse Salais F, MD      .  lactated ringers infusion   Intravenous Continuous Laraina Sulton F, MD      . lidocaine (PF) (XYLOCAINE) 1 % injection 0-30 mL  0-30 mL Intradermal Once PRN Madylyn Insco F, MD      . metoprolol tartrate (LOPRESSOR) tablet 12.5 mg  12.5 mg Oral BID Masiel Gentzler F, MD      . morphine 2 MG/ML injection 2 mg  2 mg Intravenous Q2H PRN Giannina Bartolome F, MD      . ondansetron (ZOFRAN-ODT) disintegrating tablet 4 mg  4 mg Oral Q6H PRN Chanele Douglas F, MD       Or  . ondansetron (ZOFRAN) injection 4 mg  4 mg Intravenous Q6H PRN Rakeem Colley F, MD      . oxyCODONE (Oxy IR/ROXICODONE) immediate release tablet 5-10 mg  5-10 mg Oral  Q4H PRN Geovanna Simko F, MD      . pantoprazole (PROTONIX) injection 40 mg  40 mg Intravenous QHS Tarek Cravens F, MD      . prochlorperazine (COMPAZINE) tablet 10 mg  10 mg Oral Q6H PRN Carmelle Bamberg F, MD       Or  . prochlorperazine (COMPAZINE) injection 5-10 mg  5-10 mg Intravenous Q6H PRN Tayia Stonesifer F, MD      . zolpidem (AMBIEN) tablet 5 mg  5 mg Oral QHS PRN Darcell Yacoub, Marjory Lies, MD       Current Outpatient Prescriptions  Medication Sig Dispense Refill  . acetaminophen (TYLENOL) 500 MG tablet Take 500 mg by mouth every 6 (six) hours as needed.    [name] aspirin 325 MG EC tablet Take 1 tablet (325 mg total) by mouth daily. 30 tablet 0  . atorvastatin (LIPITOR) 40 MG tablet Take 1 tablet (40 mg total) by mouth daily. 90 tablet 3  . bisacodyl (DULCOLAX) 5 MG EC tablet Take 2 tablets (10 mg total) by mouth daily as needed for moderate constipation (Give daily if no BM). 30 tablet 0  . ferrous sulfate 325 (65 FE) MG tablet Take 325 mg by mouth daily with breakfast.    . furosemide (LASIX) 20 MG tablet Take 1-2 tablets (20-40 mg total) by mouth as directed. Once daily with additional dose if he gains 2 pounds in a day or 5 pounds in a week. 60 tablet 3  . ipratropium-albuterol (DUONEB) 0.5-2.5 (3) MG/3ML SOLN Take 3 mLs by nebulization every 6 (six) hours as needed. When awake     . metoprolol tartrate (LOPRESSOR) 25 MG tablet Take 12.5 mg by mouth 2 (two) times daily. Hold for SBP< 110 or HR <60    . potassium chloride (K-DUR) 10 MEQ tablet Take 10 mEq by mouth daily.    . tamsulosin (FLOMAX) 0.4 MG CAPS capsule Take 1 capsule (0.4 mg total) by mouth daily. 30 capsule 1  . furosemide (LASIX) 20 MG tablet TAKE 1 TABLET BY MOUTH EVERY DAY (Patient not taking: Reported on 05/30/2017) 5 tablet 0  . KLOR-CON M10 10 MEQ tablet TAKE 1 TABLET BY MOUTH EVERY DAY (Patient not taking: Reported on 05/30/2017) 5 tablet 0  . OXYGEN Inhale into the lungs. By nasal cannula and wean as tolerated to keep O2 sats >90%        Review of Systems Full ROS  was asked and was negative except for the information on the HPI  Physical Exam Blood pressure (!) 145/72, pulse 97, temperature 98 F (36.7 C), temperature source Oral, resp. rate 19, height 5\' 4"  (1.626 m), weight 68 kg (150  lb), SpO2 93 %. CONSTITUTIONAL: Debilitated male, some dyspnea. EYES: Pupils are equal, round, and reactive to light, Sclera are non-icteric. EARS, NOSE, MOUTH AND THROAT: The oropharynx is clear. The oral mucosa is pink and moist. Hearing is intact to voice. LYMPH NODES:  Lymph nodes in the neck are normal. RESPIRATORY:  Lungs sounds are distant bilaterally, no evidence of tension, no tracheal deviation. No subq emphysema CARDIOVASCULAR: Heart is regular without murmurs, gallops, or rubs. Midline sternotomy scar GI: The abdomen is  soft, nontender, and nondistended. There are no palpable masses. There is no hepatosplenomegaly. There are normal bowel sounds in all quadrants. GU: Rectal deferred.   MUSCULOSKELETAL: Normal muscle strength and tone. No cyanosis or edema.   SKIN: Turgor is good and there are no pathologic skin lesions or ulcers. NEUROLOGIC: Motor and sensation is grossly normal. Cranial nerves are grossly intact. PSYCH:  Oriented to person, place and time. Affect is normal.  Data Reviewed  I have personally reviewed the patient's imaging, laboratory findings and medical records.    Assessment/Plan 71 year old with multiple comorbidities including a CABG less than 4 months ago and a history of COPD and bleb disease now with an pneumothorax of 30%. Currently patient is sa saturating adequately on nasal cannula. He does have some dyspnea on exertion but no evidence of tension component. Discussed in detail with both Dr. Genevive Bi from [facility] and Dr. Barbie Banner from interventional radiology. Drs. Hoss expressed that there is some limitation regarding equipment and given that it is after hours. Personally I feel that the best  course of fashion is to perform a CT-guided drain of the pleural cavity given the fact that he had a recent CABG and he had multiple bleb disease. If we may place a chest tube blindly I would only do an emergency basis given the fact that there is a high risk of a bronchopleural fistula and persistent air leak. I have explained in detail to both the family and also to Dr. Helayne Seminole from the ER. The understanding if his respiratory status deteriorated he may need an emergency chest tube. I'll also consult the hospitalist service for optimization of his COPD and coronary artery disease. In the meantime we'll continue nebulizers and we'll put him on a nonrebreather able to try to decrease the potential pneumothorax. Dr. Barbie Banner has agreed to arrange for a CT-guided chest tube placement first thing in the morning.  Caroleen Hamman, MD FACS General Surgeon 05/30/2017, 6:39 PM

## 2017-05-31 ENCOUNTER — Inpatient Hospital Stay: Payer: Medicare Other

## 2017-05-31 DIAGNOSIS — J9311 Primary spontaneous pneumothorax: Secondary | ICD-10-CM

## 2017-05-31 MED ORDER — FENTANYL CITRATE (PF) 100 MCG/2ML IJ SOLN
INTRAMUSCULAR | Status: AC | PRN
  Administered 2017-05-31: 50 ug via INTRAVENOUS

## 2017-05-31 MED ORDER — MIDAZOLAM HCL 5 MG/5ML IJ SOLN
INTRAMUSCULAR | Status: AC | PRN
  Administered 2017-05-31: 1 mg via INTRAVENOUS

## 2017-05-31 MED ORDER — IPRATROPIUM-ALBUTEROL 0.5-2.5 (3) MG/3ML IN SOLN
3.0000 mL | RESPIRATORY_TRACT | Status: DC
  Administered 2017-05-31 – 2017-06-02 (×15): 3 mL via RESPIRATORY_TRACT
  Filled 2017-05-31 (×14): qty 3

## 2017-05-31 MED ORDER — METHYLPREDNISOLONE SODIUM SUCC 40 MG IJ SOLR
40.0000 mg | Freq: Two times a day (BID) | INTRAMUSCULAR | Status: DC
  Administered 2017-05-31 (×2): 40 mg via INTRAVENOUS
  Filled 2017-05-31 (×2): qty 1

## 2017-05-31 MED ORDER — MIDAZOLAM HCL 5 MG/5ML IJ SOLN
INTRAMUSCULAR | Status: AC
  Filled 2017-05-31: qty 5

## 2017-05-31 MED ORDER — FENTANYL CITRATE (PF) 100 MCG/2ML IJ SOLN
INTRAMUSCULAR | Status: AC
  Filled 2017-05-31: qty 4

## 2017-05-31 NOTE — Progress Notes (Signed)
  Patient ID: Townsend Cudworth, male   DOB: [birth year], 71 y.o.   MRN: [id number]  HISTORY: Felt better after admission last evening.  Today, he is only minimally short of breath.  No chest pain.     Vitals:   05/31/17 0845 05/31/17 0849  BP: (!) 102/56 (!) 102/55  Pulse: 81 73  Resp: 17 13  Temp:       EXAM:    Resp: Lungs are markedly diminished on the left and somewhat so on the right.  No respiratory distress, normal effort. Heart:  Regular without murmurs Abd:  Abdomen is soft, non distended and non tender. No masses are palpable.  There is no rebound and no guarding.  Neurological: Alert and oriented to person, place, and time. Coordination normal.  Skin: Skin is warm and dry. No rash noted. No diaphoretic. No erythema. No pallor.  Psychiatric: Normal mood and affect. Normal behavior. Judgment and thought content normal.    ASSESSMENT: Independent review of Chest CT.  Insertion of pigtail with some expansion of lung.  Severe emphysema   PLAN:   Chest tube to suction Wean oxygen Physical Therapy consult    Nestor Lewandowsky, MD

## 2017-05-31 NOTE — Consult Note (Addendum)
Medical Consultation  Thomas Trujillo OMV:[id number] DOB: May 12, 1946 DOA: 05/30/2017 PCP: [name], MD   Requesting physician: dr Arther Abbott Date of consultation: 05/31/2017 Reason for consultation: medical managment  Impression/Recommendations  71-year-old male with a history of advance  Bullous COPD, CAD status post CABG and hyperlipidemia who presents with shortness of breath and found to have moderate left-sided pneumothorax.  1. Moderate left-sided pneumothorax: Underwent CT-guided left-sided chest tube placement  2. Advanced Bullous COPD: Start steroids and nebs Dr Raul Del consult.  3. Coronary artery disease status post 2 vessel CABG and ischemic cardiomyopathy, EF 30-35%: Continue aspirin, atorvastatin, metoprolol and Lasix [facility] consult IF NEEDED  4. BPH: Continue tamsulosin  5. Hyperlipidemia: Continue statin    Chief Complaint:  Shortness of breath  HPI:   71 year old male with advanced COPD and CAD status post 2 vessel CABG in April who presents with increasing shortness of breath and found to have moderate sized left pneumothorax. Hospitalist was consulted for medical management. Patient has had shortness of breath for several months which has been increasing. His pulmonologist is Dr. Raul Del.  He had chest tube placement by IR this am. He is feeling much better as far as SOB.  Review of Systems  Constitutional: Negative for fever, chills weight loss HENT: Negative for ear pain, nosebleeds, congestion, facial swelling, rhinorrhea, neck pain, neck stiffness and ear discharge.   Respiratory: Positive shortness of breath no wheezing  Cardiovascular: Negative for chest pain, palpitations and leg swelling.  Gastrointestinal: Negative for heartburn, abdominal pain, vomiting, diarrhea or consitpation Genitourinary: Negative for dysuria, urgency, frequency, hematuria Musculoskeletal: Negative for back pain or joint pain Neurological: Negative for dizziness, seizures,  syncope, focal weakness,  numbness and headaches.  Hematological: Does not bruise/bleed easily.  Psychiatric/Behavioral: Negative for hallucinations, confusion, dysphoric mood   Past Medical History:  Diagnosis Date  . Bleb, lung ([place])   . Coronary artery disease 02/14/2017   NSTEMI with urgent CABG (LIMA-LAD and SVG->ramus)  . Hyperlipidemia   . Ischemic cardiomyopathy   . Patient denies medical problems    Past Surgical History:  Procedure Laterality Date  . ABDOMINAL HYSTERECTOMY    . CATARACT EXTRACTION, BILATERAL    . CORONARY ARTERY BYPASS GRAFT N/A 02/14/2017   Procedure: CORONARY ARTERY BYPASS GRAFTING (CABG) x 2 , using left internal mammary artery and right leg greater saphenous vein harvested endoscopically LIMA-LAD, SVG-RAMUS;  Surgeon: [name], MD;  Location: [facility];  Service: Open Heart Surgery;  Laterality: N/A;  . ENDOVEIN HARVEST OF GREATER SAPHENOUS VEIN Right 02/14/2017   Procedure: ENDOVEIN HARVEST OF RIGHT THIGH GREATER SAPHENOUS VEIN;  Surgeon: [name], MD;  Location: [facility];  Service: Open Heart Surgery;  Laterality: Right;  . LEFT HEART CATH AND CORONARY ANGIOGRAPHY N/A 02/14/2017   Procedure: Left Heart Cath and Coronary Angiography;  Surgeon: [name], MD;  Location: [facility];  Service: Cardiovascular;  Laterality: N/A;  . NASAL POLYP SURGERY    . STAPLING OF BLEBS N/A 02/14/2017   Procedure: STAPLING OF LARGE LEFT UPPER LOBE PULMONARY BLEB;  Surgeon: [name], MD;  Location: [facility];  Service: Open Heart Surgery;  Laterality: N/A;  . TEE WITHOUT CARDIOVERSION N/A 02/14/2017   Procedure: TRANSESOPHAGEAL ECHOCARDIOGRAM (TEE);  Surgeon: [name], MD;  Location: [facility];  Service: Open Heart Surgery;  Laterality: N/A;   Social History:  reports that he quit smoking about 10 years ago. He has never used smokeless tobacco. He reports that he does not  drink alcohol or use drugs.  Allergies  Allergen Reactions  . No  Known Allergies    Family History  Problem Relation Age of Onset  . Obesity Son     Prior to Admission medications   Medication Sig Start Date End Date Taking? Authorizing Provider  acetaminophen (TYLENOL) 500 MG tablet Take 500 mg by mouth every 6 (six) hours as needed.   Yes [provider]  aspirin 325 MG EC tablet Take 1 tablet (325 mg total) by mouth daily. 02/25/17  Yes Conte, Tessa N, PA-C  atorvastatin (LIPITOR) 40 MG tablet Take 1 tablet (40 mg total) by mouth daily. 04/05/17 07/04/17 Yes End, Harrell Gave, MD  bisacodyl (DULCOLAX) 5 MG EC tablet Take 2 tablets (10 mg total) by mouth daily as needed for moderate constipation (Give daily if no BM). 02/26/17  Yes Conte, Tessa N, PA-C  ferrous sulfate 325 (65 FE) MG tablet Take 325 mg by mouth daily with breakfast.   Yes [provider]  furosemide (LASIX) 20 MG tablet Take 1-2 tablets (20-40 mg total) by mouth as directed. Once daily with additional dose if he gains 2 pounds in a day or 5 pounds in a week. 04/27/17  Yes End, Harrell Gave, MD  ipratropium-albuterol (DUONEB) 0.5-2.5 (3) MG/3ML SOLN Take 3 mLs by nebulization every 6 (six) hours as needed. When awake    Yes [provider]  metoprolol tartrate (LOPRESSOR) 25 MG tablet Take 12.5 mg by mouth 2 (two) times daily. Hold for SBP< 110 or HR <60   Yes [provider]  potassium chloride (K-DUR) 10 MEQ tablet Take 10 mEq by mouth daily.   Yes [provider]  tamsulosin (FLOMAX) 0.4 MG CAPS capsule Take 1 capsule (0.4 mg total) by mouth daily. 02/25/17  Yes Conte, Tessa N, PA-C  furosemide (LASIX) 20 MG tablet TAKE 1 TABLET BY MOUTH EVERY DAY Patient not taking: Reported on 05/30/2017 04/16/17   Prescott Gum, Collier Salina, MD  KLOR-CON M10 10 MEQ tablet TAKE 1 TABLET BY MOUTH EVERY DAY Patient not taking: Reported on 05/30/2017 04/16/17   Ivin Poot, MD  OXYGEN Inhale into the lungs. By nasal cannula and wean as tolerated to keep O2 sats >90%    [provider]    Physical Exam: Blood pressure (!) 102/55, pulse 73, temperature (!) 97.5 F (36.4 C), temperature source Oral, resp. rate 13, height 5\' 4"  (1.626 m), weight 69.6 kg (153 lb 6.4 oz), SpO2 100 %. @VITALS2 @ [employer]   05/30/17 1602 05/30/17 2100  Weight: 68 kg (150 lb) 69.6 kg (153 lb 6.4 oz)    Intake/Output Summary (Last 24 hours) at 05/31/17 0905 Last data filed at 05/31/17 5573  Gross per 24 hour  Intake            662.5 ml  Output              800 ml  Net           -137.5 ml     Constitutional: Appears well-developed and well-nourished. No distress. HENT: Normocephalic. [name] Oropharynx is clear and moist.  Eyes: Conjunctivae and EOM are normal. PERRLA, no scleral icterus.  Neck: Normal ROM. Neck supple. No JVD. No tracheal deviation. CVS: RRR, S1/S2 +, no murmurs, no gallops, no carotid bruit.  Pulmonary: Effort and breath sounds normal, no stridor, rhonchi, wheezes, rales.  Left chest tube Abdominal: Soft. BS +,  no distension, tenderness, rebound or guarding.  Musculoskeletal: Normal range of motion. No  edema and no tenderness.  Neuro: Alert. CN 2-12 grossly intact. No focal deficits. Skin: Skin is warm and dry. No rash noted. Psychiatric: Normal mood and affect.    Labs  Basic Metabolic Panel:  Recent Labs Lab 05/30/17 1614  NA 137  K 4.9  CL 104  CO2 23  GLUCOSE 115*  BUN 27*  CREATININE 1.13  CALCIUM 9.5   Liver Function Tests: No results for input(s): AST, ALT, ALKPHOS, BILITOT, PROT, ALBUMIN in the last 168 hours. No results for input(s): LIPASE, AMYLASE in the last 168 hours.  CBC:  Recent Labs Lab 05/30/17 1614  WBC 9.1  HGB 13.9  HCT 42.0  MCV 91.3  PLT 216   Cardiac Enzymes:  Recent Labs Lab 05/30/17 1614  TROPONINI <0.03   BNP: Invalid input(s): POCBNP CBG: No results for input(s): GLUCAP in the last 168 hours.  Radiological Exams: Ct Chest Wo Contrast  Result Date: 05/30/2017 CLINICAL DATA:  Known  pneumothorax. EXAM: CT CHEST WITHOUT CONTRAST TECHNIQUE: Multidetector CT imaging of the chest was performed following the standard protocol without IV contrast. COMPARISON:  Chest x-ray from earlier today FINDINGS: Cardiovascular: Normal heart size. No pericardial effusion. Atherosclerosis of the aorta and coronaries. Status post CABG. Mediastinum/Nodes: Negative for adenopathy or mass. No pneumomediastinum. Lungs/Pleura: Known left pneumothorax. No convincing change in size when allowing for differences in position. The pneumothorax is likely 50%. The left lower lobe is completely collapsed. The aerated left upper lobe shows bullous emphysema. Centrilobular and panlobular emphysema is extensive. Other than the emphysema, no air leak source is seen. There is no visible cavity or mass. The left upper lobe appears partly scarred/adherent to the pleura behind the sternum. Upper Abdomen: No acute finding. Musculoskeletal: No acute finding. Asymmetric atrophy of the left trapezius. These results were called by telephone at the time of interpretation on 05/30/2017 at 5:00 pm to Dr. Harvest Dark , who verbally acknowledged these results. IMPRESSION: 1. Known the left pneumothorax, at least 50%. Comparison to prior is limited due to differences in patient positioning but there appears to be new left diaphragm depression concerning for ongoing leak. The left lower lobe is completely collapsed. Severe, bullous emphysema is the visualized risk factor for spontaneous pneumothorax. 2. Small area of left upper lobe pleural adhesion beneath the sternum and costochondral junction. 3. Aortic Atherosclerosis (ICD10-I70.0). Electronically Signed   By: Monte Fantasia M.D.   On: 05/30/2017 17:03   Dg Chest Portable 1 View  Result Date: 05/30/2017 CLINICAL DATA:  LEFT pneumothorax, history of bleb disease, ischemic cardiomyopathy, coronary artery disease EXAM: PORTABLE CHEST 1 VIEW COMPARISON:  Portable exam 1617 hours compared  to 03/27/2017 FINDINGS: Normal heart size post CABG. Mediastinal contours and pulmonary vascularity normal. Atherosclerotic calcification aorta. Severe emphysematous changes. Large LEFT pneumothorax without mediastinal shift, estimated 25-30%. Chronic accentuation initial markings in the mid to lower lungs. No pleural effusion or definite acute infiltrate. Bones appear demineralized. LEFT nipple shadow. IMPRESSION: Large LEFT pneumothorax estimated at 25 and 30% without mediastinal shift. Underlying emphysematous and chronic interstitial changes. Aortic Atherosclerosis (ICD10-I70.0) and Emphysema (ICD10-J43.9). Critical Value/emergent results were called by telephone at the time of interpretation on 05/30/2017 at 4:42 pm to Dr. Harvest Dark , who verbally acknowledged these results. Electronically Signed   By: Lavonia Dana M.D.   On: 05/30/2017 16:44    EKG:    Thank you for allowing me to participate in the care of your patient. We will continue to follow.   Note: This dictation  was prepared with Dragon dictation along with smaller phrase technology. Any transcriptional errors that result from this process are unintentional.  Time spent: 45 minutes  Rachelanne Whidby, MD

## 2017-05-31 NOTE — Consult Note (Signed)
Chief Complaint: Symptomatic pneumothorax  Referring Physician(s): Pabon  Patient Status: [name] - In-pt  History of Present Illness: Thomas Trujillo is a 71 y.o. male with past medical history significant for CAD (post myocardial infarction and CABG in April of this year), hyperlipidemia and advanced bullous COPD, who presented to the emergency department last evening with shortness of breath with chest radiograph and subsequent CT scan demonstrating a moderate (approximately 50%) left sided pneumothorax.  Patient was evaluated in the Emergency Department by Dr. Dahlia Byes who was hesitant to place a bedside chest tube given the patient's advanced bullous disease as well as clinical stability. As such, request has been made to proceed with CT-guided left sided chest tube placement.  Patient states that since admission is subject insurance and breath has improved. He denies chest pain. Fever or chills. Patient reports a mild mucous producing cough but no hemoptysis.  Past Medical History:  Diagnosis Date  . Bleb, lung ([place])   . Coronary artery disease 02/14/2017   NSTEMI with urgent CABG (LIMA-LAD and SVG->ramus)  . Hyperlipidemia   . Ischemic cardiomyopathy   . Patient denies medical problems     Past Surgical History:  Procedure Laterality Date  . ABDOMINAL HYSTERECTOMY    . CATARACT EXTRACTION, BILATERAL    . CORONARY ARTERY BYPASS GRAFT N/A 02/14/2017   Procedure: CORONARY ARTERY BYPASS GRAFTING (CABG) x 2 , using left internal mammary artery and right leg greater saphenous vein harvested endoscopically LIMA-LAD, SVG-RAMUS;  Surgeon: [name], MD;  Location: [facility];  Service: Open Heart Surgery;  Laterality: N/A;  . ENDOVEIN HARVEST OF GREATER SAPHENOUS VEIN Right 02/14/2017   Procedure: ENDOVEIN HARVEST OF RIGHT THIGH GREATER SAPHENOUS VEIN;  Surgeon: [name], MD;  Location: [place];  Service: Open Heart Surgery;  Laterality: Right;  . LEFT HEART CATH AND CORONARY  ANGIOGRAPHY N/A 02/14/2017   Procedure: Left Heart Cath and Coronary Angiography;  Surgeon: [name], MD;  Location: [facility];  Service: Cardiovascular;  Laterality: N/A;  . NASAL POLYP SURGERY    . STAPLING OF BLEBS N/A 02/14/2017   Procedure: STAPLING OF LARGE LEFT UPPER LOBE PULMONARY BLEB;  Surgeon: [name], MD;  Location: [place];  Service: Open Heart Surgery;  Laterality: N/A;  . TEE WITHOUT CARDIOVERSION N/A 02/14/2017   Procedure: TRANSESOPHAGEAL ECHOCARDIOGRAM (TEE);  Surgeon: [name], MD;  Location: [place];  Service: Open Heart Surgery;  Laterality: N/A;    Allergies: No known allergies  Medications: Prior to Admission medications   Medication Sig Start Date End Date Taking? Authorizing Provider  acetaminophen (TYLENOL) 500 MG tablet Take 500 mg by mouth every 6 (six) hours as needed.   Yes [provider]  aspirin 325 MG EC tablet Take 1 tablet (325 mg total) by mouth daily. 02/25/17  Yes Conte, Tessa N, PA-C  atorvastatin (LIPITOR) 40 MG tablet Take 1 tablet (40 mg total) by mouth daily. 04/05/17 07/04/17 Yes End, Harrell Gave, MD  bisacodyl (DULCOLAX) 5 MG EC tablet Take 2 tablets (10 mg total) by mouth daily as needed for moderate constipation (Give daily if no BM). 02/26/17  Yes Conte, Tessa N, PA-C  ferrous sulfate 325 (65 FE) MG tablet Take 325 mg by mouth daily with breakfast.   Yes [provider]  furosemide (LASIX) 20 MG tablet Take 1-2 tablets (20-40 mg total) by mouth as directed. Once daily with additional dose if he gains 2 pounds in a day or 5 pounds in  a week. 04/27/17  Yes End, Harrell Gave, MD  ipratropium-albuterol (DUONEB) 0.5-2.5 (3) MG/3ML SOLN Take 3 mLs by nebulization every 6 (six) hours as needed. When awake    Yes [provider]  metoprolol tartrate (LOPRESSOR) 25 MG tablet Take 12.5 mg by mouth 2 (two) times daily. Hold for SBP< 110 or HR <60   Yes [provider]  potassium chloride (K-DUR) 10  MEQ tablet Take 10 mEq by mouth daily.   Yes [provider]  tamsulosin (FLOMAX) 0.4 MG CAPS capsule Take 1 capsule (0.4 mg total) by mouth daily. 02/25/17  Yes Conte, Tessa N, PA-C  furosemide (LASIX) 20 MG tablet TAKE 1 TABLET BY MOUTH EVERY DAY Patient not taking: Reported on 05/30/2017 04/16/17   [place] Gum, Collier Salina, MD  KLOR-CON M10 10 MEQ tablet TAKE 1 TABLET BY MOUTH EVERY DAY Patient not taking: Reported on 05/30/2017 04/16/17   Ivin Poot, MD  OXYGEN Inhale into the lungs. By nasal cannula and wean as tolerated to keep O2 sats >90%    [provider]     Family History  Problem Relation Age of Onset  . Obesity Son     Social History   Social History  . Marital status: Divorced    Spouse name: N/A  . Number of children: N/A  . Years of education: N/A   Social History Main Topics  . Smoking status: Former Smoker    Quit date: 04/06/2007  . Smokeless tobacco: Never Used  . Alcohol use No  . Drug use: No  . Sexual activity: Not Asked   Other Topics Concern  . None   Social History Narrative  . None    ECOG Status: 2 - Symptomatic, <50% confined to bed  Review of Systems: A 12 point ROS discussed and pertinent positives are indicated in the HPI above.  All other systems are negative.  Review of Systems  Constitutional: Positive for activity change. Negative for fever.  Respiratory: Positive for cough and shortness of breath.   Cardiovascular: Negative.     Vital Signs: BP (!) 94/54 (BP Location: Left Arm)   Pulse 78   Temp (!) 97.5 F (36.4 C) (Oral)   Resp 20   Ht 5\' 4"  (1.626 m)   Wt 153 lb 6.4 oz (69.6 kg)   SpO2 100%   BMI 26.33 kg/m   Physical Exam  Constitutional: He appears well-developed and well-nourished.  HENT:  Head: Normocephalic and atraumatic.  Cardiovascular: Normal rate and regular rhythm.   Pulmonary/Chest:  Decreased left sided breath sounds.  Skin: Skin is warm and dry.  Well-healed median sternotomy scar    Psychiatric: He has a normal mood and affect. His behavior is normal.  Nursing note and vitals reviewed.   Imaging: Ct Chest Wo Contrast  Result Date: 05/30/2017 CLINICAL DATA:  Known pneumothorax. EXAM: CT CHEST WITHOUT CONTRAST TECHNIQUE: Multidetector CT imaging of the chest was performed following the standard protocol without IV contrast. COMPARISON:  Chest x-ray from earlier today FINDINGS: Cardiovascular: Normal heart size. No pericardial effusion. Atherosclerosis of the aorta and coronaries. Status post CABG. Mediastinum/Nodes: Negative for adenopathy or mass. No pneumomediastinum. Lungs/Pleura: Known left pneumothorax. No convincing change in size when allowing for differences in position. The pneumothorax is likely 50%. The left lower lobe is completely collapsed. The aerated left upper lobe shows bullous emphysema. Centrilobular and panlobular emphysema is extensive. Other than the emphysema, no air leak source is seen. There is no visible cavity or mass. The  left upper lobe appears partly scarred/adherent to the pleura behind the sternum. Upper Abdomen: No acute finding. Musculoskeletal: No acute finding. Asymmetric atrophy of the left trapezius. These results were called by telephone at the time of interpretation on 05/30/2017 at 5:00 pm to Dr. Harvest Dark , who verbally acknowledged these results. IMPRESSION: 1. Known the left pneumothorax, at least 50%. Comparison to prior is limited due to differences in patient positioning but there appears to be new left diaphragm depression concerning for ongoing leak. The left lower lobe is completely collapsed. Severe, bullous emphysema is the visualized risk factor for spontaneous pneumothorax. 2. Small area of left upper lobe pleural adhesion beneath the sternum and costochondral junction. 3. Aortic Atherosclerosis (ICD10-I70.0). Electronically Signed   By: Monte Fantasia M.D.   On: 05/30/2017 17:03   Dg Chest Portable 1 View  Result Date:  05/30/2017 CLINICAL DATA:  LEFT pneumothorax, history of bleb disease, ischemic cardiomyopathy, coronary artery disease EXAM: PORTABLE CHEST 1 VIEW COMPARISON:  Portable exam 1617 hours compared to 03/27/2017 FINDINGS: Normal heart size post CABG. Mediastinal contours and pulmonary vascularity normal. Atherosclerotic calcification aorta. Severe emphysematous changes. Large LEFT pneumothorax without mediastinal shift, estimated 25-30%. Chronic accentuation initial markings in the mid to lower lungs. No pleural effusion or definite acute infiltrate. Bones appear demineralized. LEFT nipple shadow. IMPRESSION: Large LEFT pneumothorax estimated at 25 and 30% without mediastinal shift. Underlying emphysematous and chronic interstitial changes. Aortic Atherosclerosis (ICD10-I70.0) and Emphysema (ICD10-J43.9). Critical Value/emergent results were called by telephone at the time of interpretation on 05/30/2017 at 4:42 pm to Dr. Harvest Dark , who verbally acknowledged these results. Electronically Signed   By: Lavonia Dana M.D.   On: 05/30/2017 16:44    Labs:  CBC:  Recent Labs  02/25/17 0246 03/01/17 04/05/17 1001 05/30/17 1614  WBC 11.5* 8.6 5.7 9.1  HGB 8.9* 9.5* 11.5* 13.9  HCT 27.4* 30* 34.9* 42.0  PLT 347 407* 280 216    COAGS:  Recent Labs  02/13/17 2212 02/14/17 1810  INR 1.11 1.62  APTT 30 33    BMP:  Recent Labs  02/25/17 0246 03/01/17 04/05/17 1001 04/28/17 1529 05/30/17 1614  NA 133* 142 141 139 137  K 3.9 4.3 4.1 3.8 4.9  CL 94*  --  102 103 104  CO2 28  --  25 28 23   GLUCOSE 101*  --  114* 104* 115*  BUN 21* 29* 23 24* 27*  CALCIUM 8.2*  --  9.3 9.5 9.5  CREATININE 1.11 0.9 1.02 1.29* 1.13  GFRNONAA >60  --  74 55* >60  GFRAA >60  --  86 >60 >60    LIVER FUNCTION TESTS:  Recent Labs  03/01/17 04/05/17 1001  BILITOT  --  0.2  AST 23 18  ALT 36 19  ALKPHOS 86 121*  PROT  --  7.0  ALBUMIN  --  3.9    TUMOR MARKERS: No results for input(s): AFPTM, CEA,  CA199, CHROMGRNA in the last 8760 hours.  Assessment and Plan:  Koden Hunzeker is a 71 y.o. male with past medical history significant for CAD (post myocardial infarction and CABG in April of this year), hyperlipidemia and advanced bullous COPD, who presented emergency department last evening with shortness of breath with chest radiograph and subsequent CT scan demonstrating a moderate (approximately 50%) left sided pneumothorax.  Patient is then evaluated by the surgical service and request has been made to proceed with CT-guided left sided chest tube placement.  Risks and benefits of  a left-sided chest tube placement were discussed with the patient including bleeding, infection, damage to adjacent structures, and need for additional interventions.  All of the patient's questions were answered, patient is agreeable to proceed.  Consent signed and in chart.  Thank you for this interesting consult.  I greatly enjoyed meeting Cyree Chuong and look forward to participating in their care.  A copy of this report was sent to the requesting provider on this date.  Electronically Signed: Sandi Mariscal, MD 05/31/2017, 8:15 AM   I spent a total of 20 Minutes in face to face in clinical consultation, greater than 50% of which was counseling/coordinating care for CT-guided chest tube placement

## 2017-05-31 NOTE — Procedures (Signed)
Pre procedural Dx: Symptomatic left sided PTX Post procedural Dx: Same  Technically successful CT guided placed of a 12 Fr drainage catheter placement into the left pleural space.    EBL: Minimal  Complications: None immediate  Ronny Bacon, MD Pager #: [phone]

## 2017-06-01 ENCOUNTER — Inpatient Hospital Stay: Payer: Medicare Other

## 2017-06-01 DIAGNOSIS — J939 Pneumothorax, unspecified: Secondary | ICD-10-CM

## 2017-06-01 MED ORDER — ASPIRIN EC 325 MG PO TBEC
325.0000 mg | DELAYED_RELEASE_TABLET | Freq: Every day | ORAL | Status: DC
  Administered 2017-06-01 – 2017-06-02 (×2): 325 mg via ORAL
  Filled 2017-06-01 (×2): qty 1

## 2017-06-01 MED ORDER — FUROSEMIDE 20 MG PO TABS: 20.0000 mg | ORAL_TABLET | Freq: Every day | ORAL | Status: DC

## 2017-06-01 MED ORDER — TAMSULOSIN HCL 0.4 MG PO CAPS
0.4000 mg | ORAL_CAPSULE | Freq: Every day | ORAL | Status: DC
  Administered 2017-06-01 – 2017-06-02 (×2): 0.4 mg via ORAL
  Filled 2017-06-01 (×2): qty 1

## 2017-06-01 MED ORDER — METHYLPREDNISOLONE SODIUM SUCC 40 MG IJ SOLR
40.0000 mg | Freq: Every day | INTRAMUSCULAR | Status: DC
  Administered 2017-06-01 – 2017-06-02 (×2): 40 mg via INTRAVENOUS
  Filled 2017-06-01 (×2): qty 1

## 2017-06-01 MED ORDER — ATORVASTATIN CALCIUM 20 MG PO TABS
40.0000 mg | ORAL_TABLET | Freq: Every day | ORAL | Status: DC
Start: 2017-06-01 — End: 2017-06-02
  Administered 2017-06-01: 40 mg via ORAL
  Filled 2017-06-01: qty 2

## 2017-06-01 MED ORDER — PANTOPRAZOLE SODIUM 40 MG PO TBEC
40.0000 mg | DELAYED_RELEASE_TABLET | Freq: Every day | ORAL | Status: DC
  Administered 2017-06-01: 40 mg via ORAL
  Filled 2017-06-01: qty 1

## 2017-06-01 NOTE — Progress Notes (Signed)
Physical Therapy Treatment Patient Details Name: Thomas Trujillo MRN: [id number] DOB: 07-Jul-1946 Today's Date: 06/01/2017    History of Present Illness Patient is a 71 y/o male that presents with shortness of breath, noted to have ~30% collapse of L lung, chest tube was placed on 8/8. He has a history of COPD and had CABG less than 4 months ago, he was recently in cardiac rehab.     PT Comments    Pt is pleasant and willing to participate for return to PLOF, but slightly impulsive. Pt performs bed mobility with independence and tranfers and ambulation with supervision, primarily due to impulsivity. Pt on 2 L supplemtnal O2 throughout duration of treatment, dropping to 87% x1 after amb 450 ft with no rest break, but returned to >90% with seated rest break and pursed lipped breathing (within 45 sec). Pt performed 5xSTS in 9.56 sec, indicating mild impairment in strength and power through B LE's. Pt cont to present with the following deficits: strength, endurance, power, safety awareness, and pain. Overall, pt responded well to today's treatment with no adverse affects and is progressing well towards functional goals. Pt would benefit from skilled PT to address the previously mentioned impairments and promote return to PLOF. Based on the previously mentioned impairments, currently recommending HHPT, pending d/c.     Follow Up Recommendations  Home health PT     Equipment Recommendations  None recommended by PT    Recommendations for Other Services       Precautions / Restrictions Precautions Precautions: Fall Precaution Comments:  Restrictions Weight Bearing Restrictions: No    Mobility  Bed Mobility Overal bed mobility: Independent             General bed mobility comments: HOB elevated, no deficits in transfer noted.   Transfers Overall transfer level: Needs assistance Equipment used: None Transfers: Sit to/from Stand Sit to Stand: Supervision         General transfer  comment: Pt cont to be supervision with STS due to impulsivity. Pt with tendency to attempt transfering to chair, despite SPT recommendtion to stay seated. SPT educated pt on the lines he is connected to and how they will wrap around him should he complete transfer before I am able to adjust them for safety. Pt requires no cues with mechanics for transfering STS.   Ambulation/Gait Ambulation/Gait assistance: Supervision Ambulation Distance (Feet): 450 Feet Assistive device: None Gait Pattern/deviations: WFL(Within Functional Limits)   Gait velocity interpretation: <1.8 ft/sec, indicative of risk for recurrent falls General Gait Details: Pt with good reciprocal gait pattern, but impaired safety awareness and impulsivity. SPT had to education pt on allwing to get lines, leads and room set up arranged prior to amb, for safety purposes. Second person assisted in managing lines and leads during amb for safety purposes, so SPT could guard. Pt requires no cues for mechanics, just for safety. Pt on 2L supplemental O2 during amb. Pt sats dropped to 87% following amb, but rose to >90% within 45 sec of sitting and practicing pursed lipped breathing.    Stairs            Wheelchair Mobility    Modified Rankin (Stroke Patients Only)       Balance Overall balance assessment: Independent                                          Cognition Arousal/Alertness:  Awake/alert Behavior During Therapy: WFL for tasks assessed/performed Overall Cognitive Status: Within Functional Limits for tasks assessed                                 General Comments: Impulsive with mobility, but functioning well.       Exercises Other Exercises Other Exercises: Supine and seated therex performed to B LE's with supervision x10 reps: ankle pumps, SLR's, seated marches, and LAQ's.     General Comments General comments (skin integrity, edema, etc.):       Pertinent Vitals/Pain  Pain Assessment: Faces Faces Pain Scale: Hurts a little bit Pain Location: Chest near incision.  Pain Descriptors / Indicators: Aching;Operative site guarding Pain Intervention(s): Limited activity within patient's tolerance;Monitored during session    [place] expects to be discharged to:: Private residence Living Arrangements: Alone Available Help at Discharge: Family;Available 24 hours/day Type of Home: Apartment Home Access: Level entry   Home Layout: One level Home Equipment: Grab bars - tub/shower;Walker - 2 wheels;Bedside commode      Prior Function Level of Independence: Independent      Comments: Patient had been independent, was driving and just completed cardiac rehab.    PT Goals (current goals can now be found in the care plan section) Acute Rehab PT Goals Patient Stated Goal: To return home  PT Goal Formulation: With patient Time For Goal Achievement: 06/15/17 Potential to Achieve Goals: Good Progress towards PT goals: Progressing toward goals    Frequency    Min 2X/week      PT Plan Discharge plan needs to be updated    Co-evaluation              AM-PAC PT "6 Clicks" Daily Activity  Outcome Measure  Difficulty turning over in bed (including adjusting bedclothes, sheets and blankets)?: None Difficulty moving from lying on back to sitting on the side of the bed? : A Little Difficulty sitting down on and standing up from a chair with arms (e.g., wheelchair, bedside commode, etc,.)?: A Little Help needed moving to and from a bed to chair (including a wheelchair)?: A Little Help needed walking in hospital room?: A Little Help needed climbing 3-5 steps with a railing? : A Little 6 Click Score: 19    End of Session Equipment Utilized During Treatment: Gait belt Activity Tolerance: Patient tolerated treatment well Patient left: in chair;with chair alarm set;with call bell/phone within reach Nurse Communication: Mobility status PT  Visit Diagnosis: Unsteadiness on feet (R26.81);Other abnormalities of gait and mobility (R26.89);Pain;Muscle weakness (generalized) (M62.81)     Time: 8299-3716 PT Time Calculation (min) (ACUTE ONLY): 23 min  Charges:                       G Codes:  Functional Assessment Tool Used: AM-PAC 6 Clicks Basic Mobility Functional Limitation: Mobility: Walking and moving around Mobility: Walking and Moving Around Current Status (R6789): At least 40 percent but less than 60 percent impaired, limited or restricted Mobility: Walking and Moving Around Goal Status [phone]): At least 20 percent but less than 40 percent impaired, limited or restricted    Oran Rein PT, SPT    Bevelyn Ngo 06/01/2017, 12:54 PM

## 2017-06-01 NOTE — Progress Notes (Signed)
[place] [facility] [place] NAME: Thomas Trujillo    MR#:  [id number]  DATE OF BIRTH:  [birth year]  SUBJECTIVE:   Patient doing well this morning. Chest tube still in place.  REVIEW OF SYSTEMS:    Review of Systems  Constitutional: Negative for fever, chills weight loss HENT: Negative for ear pain, nosebleeds, congestion, facial swelling, rhinorrhea, neck pain, neck stiffness and ear discharge.   Respiratory: Negative for cough, shortness of breath, wheezing  Cardiovascular: Negative for chest pain, palpitations and leg swelling.  Gastrointestinal: Negative for heartburn, abdominal pain, vomiting, diarrhea or consitpation Genitourinary: Negative for dysuria, urgency, frequency, hematuria Musculoskeletal: Negative for back pain or joint pain Neurological: Negative for dizziness, seizures, syncope, focal weakness,  numbness and headaches.  Hematological: Does not bruise/bleed easily.  Psychiatric/Behavioral: Negative for hallucinations, confusion, dysphoric mood    Tolerating Diet: yes      DRUG ALLERGIES:   Allergies  Allergen Reactions  . No Known Allergies     VITALS:  Blood pressure (!) 104/49, pulse 95, temperature 97.8 F (36.6 C), temperature source Oral, resp. rate 18, height 5\' 4"  (1.626 m), weight 69.6 kg (153 lb 6.4 oz), SpO2 94 %.  PHYSICAL EXAMINATION:  Constitutional: Appears well-developed and well-nourished. No distress. HENT: Normocephalic. [name] Oropharynx is clear and moist.  Eyes: Conjunctivae and EOM are normal. PERRLA, no scleral icterus.  Neck: Normal ROM. Neck supple. No JVD. No tracheal deviation. CVS: RRR, S1/S2 +, no murmurs, no gallops, no carotid bruit.  Pulmonary: Effort Normal with decreased breath sounds left base, no stridor, rhonchi, wheezes, rales.  Patient has chest tube placed Abdominal: Soft. BS +,  no distension, tenderness, rebound or guarding.  Musculoskeletal: Normal range of motion. No edema and  no tenderness.  Neuro: Alert. CN 2-12 grossly intact. No focal deficits. Skin: Skin is warm and dry. No rash noted. Psychiatric: Normal mood and affect.      LABORATORY PANEL:   CBC  Recent Labs Lab 05/30/17 1614  WBC 9.1  HGB 13.9  HCT 42.0  PLT 216   ------------------------------------------------------------------------------------------------------------------  Chemistries   Recent Labs Lab 05/30/17 1614  NA 137  K 4.9  CL 104  CO2 23  GLUCOSE 115*  BUN 27*  CREATININE 1.13  CALCIUM 9.5   ------------------------------------------------------------------------------------------------------------------  Cardiac Enzymes  Recent Labs Lab 05/30/17 1614  TROPONINI <0.03   ------------------------------------------------------------------------------------------------------------------  RADIOLOGY:  Ct Chest Wo Contrast  Result Date: 05/30/2017 CLINICAL DATA:  Known pneumothorax. EXAM: CT CHEST WITHOUT CONTRAST TECHNIQUE: Multidetector CT imaging of the chest was performed following the standard protocol without IV contrast. COMPARISON:  Chest x-ray from earlier today FINDINGS: Cardiovascular: Normal heart size. No pericardial effusion. Atherosclerosis of the aorta and coronaries. Status post CABG. Mediastinum/Nodes: Negative for adenopathy or mass. No pneumomediastinum. Lungs/Pleura: Known left pneumothorax. No convincing change in size when allowing for differences in position. The pneumothorax is likely 50%. The left lower lobe is completely collapsed. The aerated left upper lobe shows bullous emphysema. Centrilobular and panlobular emphysema is extensive. Other than the emphysema, no air leak source is seen. There is no visible cavity or mass. The left upper lobe appears partly scarred/adherent to the pleura behind the sternum. Upper Abdomen: No acute finding. Musculoskeletal: No acute finding. Asymmetric atrophy of the left trapezius. These results were called by  telephone at the time of interpretation on 05/30/2017 at 5:00 pm to Dr. Harvest Dark , who verbally acknowledged these results. IMPRESSION: 1. Known the left pneumothorax, at  least 50%. Comparison to prior is limited due to differences in patient positioning but there appears to be new left diaphragm depression concerning for ongoing leak. The left lower lobe is completely collapsed. Severe, bullous emphysema is the visualized risk factor for spontaneous pneumothorax. 2. Small area of left upper lobe pleural adhesion beneath the sternum and costochondral junction. 3. Aortic Atherosclerosis (ICD10-I70.0). Electronically Signed   By: Monte Fantasia M.D.   On: 05/30/2017 17:03   Dg Chest Port 1 View  Result Date: 06/01/2017 CLINICAL DATA:  Postoperative radiograph.  Initial encounter. EXAM: PORTABLE CHEST 1 VIEW COMPARISON:  Chest radiograph performed 05/31/2017 FINDINGS: Mild left basilar opacity may reflect atelectasis. Chronic right midlung scarring is noted. The previously noted left basilar pneumothorax has resolved. The left-sided chest tube is unchanged in appearance. The cardiomediastinal silhouette is normal in size. The patient is status post median sternotomy, with evidence of prior CABG. No acute osseous abnormalities are seen. IMPRESSION: 1. Mild left basilar airspace opacity may reflect atelectasis. Chronic right midlung scarring again noted. 2. Left basilar pneumothorax has resolved. Left-sided chest tube unchanged in appearance. Electronically Signed   By: Garald Balding M.D.   On: 06/01/2017 05:35   Dg Chest Port 1 View  Result Date: 05/31/2017 CLINICAL DATA:  Spontaneous pneumothorax, post left-sided chest tube placement EXAM: PORTABLE CHEST 1 VIEW COMPARISON:  05/30/2017; chest CT - 05/30/2016 FINDINGS: Near complete expansion of the left lung following left-sided chest tube placement. Tiny residual pneumothorax appears to persist within the left costophrenic angle, though note, examination  is degraded secondary to patient's marked underlying bullous emphysematous change. No definite pleural effusion. No definite right-sided pneumothorax. Grossly unchanged cardiac silhouette and mediastinal contours post median sternotomy and CABG. Atherosclerotic plaque within the thoracic aorta. Improved aeration of left lower lung with persistent left retrocardiac opacities favored to represent atelectasis. Grossly unchanged right mid lung opacities favored to represent atelectasis or scar. No new focal airspace opacities. IMPRESSION: 1. Suspected tiny residual pneumothorax within the left costophrenic angle following left-sided chest tube placement. Continued attention on follow-up is recommended. 2. Improved aeration of the left lower lung with persistent left basilar/retrocardiac opacities, likely atelectasis. 3.  Aortic Atherosclerosis (ICD10-I70.0). Electronically Signed   By: Sandi Mariscal M.D.   On: 05/31/2017 13:13   Dg Chest Portable 1 View  Result Date: 05/30/2017 CLINICAL DATA:  LEFT pneumothorax, history of bleb disease, ischemic cardiomyopathy, coronary artery disease EXAM: PORTABLE CHEST 1 VIEW COMPARISON:  Portable exam 1617 hours compared to 03/27/2017 FINDINGS: Normal heart size post CABG. Mediastinal contours and pulmonary vascularity normal. Atherosclerotic calcification aorta. Severe emphysematous changes. Large LEFT pneumothorax without mediastinal shift, estimated 25-30%. Chronic accentuation initial markings in the mid to lower lungs. No pleural effusion or definite acute infiltrate. Bones appear demineralized. LEFT nipple shadow. IMPRESSION: Large LEFT pneumothorax estimated at 25 and 30% without mediastinal shift. Underlying emphysematous and chronic interstitial changes. Aortic Atherosclerosis (ICD10-I70.0) and Emphysema (ICD10-J43.9). Critical Value/emergent results were called by telephone at the time of interpretation on 05/30/2017 at 4:42 pm to Dr. Harvest Dark , who verbally  acknowledged these results. Electronically Signed   By: Lavonia Dana M.D.   On: 05/30/2017 16:44   Ct Image Guided Drainage By Percutaneous Catheter  Result Date: 05/31/2017 INDICATION: History of advanced bullous emphysematous change, now with symptomatic left-sided pneumothorax. Please perform CT-guided left-sided chest tube placement EXAM: CT IMAGE GUIDED DRAINAGE BY PERCUTANEOUS CATHETER COMPARISON:  Chest CT - 05/30/2017; chest radiograph - 05/30/2017 MEDICATIONS: None ANESTHESIA/SEDATION: Moderate (conscious) sedation was  employed during this procedure. A total of Versed 1 mg and Fentanyl 50 mcg was administered intravenously. Moderate Sedation Time: 15 minutes. The patient's level of consciousness and vital signs were monitored continuously by radiology nursing throughout the procedure under my direct supervision. CONTRAST:  None COMPLICATIONS: None immediate. PROCEDURE: Informed written consent was obtained from the patient after a discussion of the risks, benefits and alternatives to treatment. The patient was placed supine, slightly RPO on the CT gantry and a pre procedural CT was performed re-demonstrating the known moderate size left-sided pneumothorax with near complete atelectasis of the left lower lobe and development of a trace left-sided pleural effusion. The procedure was planned. A timeout was performed prior to the initiation of the procedure. The skin overlying in the superolateral aspect of the left hemithorax was prepped and draped in the usual sterile fashion. The overlying soft tissues were anesthetized with 1% lidocaine with epinephrine. Appropriate trajectory was planned with the use of a 22 gauge spinal needle. An 18 gauge trocar needle was advanced into the left pleural space and a short Amplatz super stiff wire was coiled within the left pleural space. Appropriate positioning was confirmed with a limited CT scan. The tract was serially dilated allowing placement of a 12 [place]  all-purpose drainage catheter. Appropriate positioning was confirmed with a limited postprocedural CT scan. The left-sided chest tube was connected to a pleural vac device and sutured in place. Postprocedural imaging demonstrates near complete evacuation of the left-sided pneumothorax with small residual basilar component. A dressing was placed. The patient tolerated the procedure well without immediate post procedural complication. IMPRESSION: Successful CT guided placement of a 61 French all purpose drain catheter into the left pleural space with near complete resolution of left-sided pneumothorax. Electronically Signed   By: Sandi Mariscal M.D.   On: 05/31/2017 09:33     ASSESSMENT AND PLAN:    62o-year-old male with a history of advance  Bullous COPD, CAD status post CABG and hyperlipidemia who presents with shortness of breath and found to have moderate left-sided pneumothorax.  1. Moderate left-sided pneumothorax: Underwent CT-guided left-sided chest tube pigtail placement 8/8 by IR Management as per surgery  2. Advanced Bullous COPD: Wean IV steroids to oral steroids in a.m. Discussed with Dr Raul Del  3. Coronary artery disease status post 2 vessel CABG and ischemic cardiomyopathy, EF 30-35%: Continue aspirin, atorvastatin, Blood pressure is low and therefore will discontinue metoprolol for now but will need at the time of discharge  [facility] consult IF NEEDED  4. BPH: Continue tamsulosin  5.HLD : Continue statin  I will sign off please reconsult if needed   Management plans discussed with the patient and he is in agreement.  CODE STATUS: full  TOTAL TIME TAKING CARE OF THIS PATIENT: 21 minutes.     POSSIBLE D/C 2-4 days, DEPENDING ON CLINICAL CONDITION.   Imberly Troxler M.D on 06/01/2017 at 8:00 AM  Between 7am to 6pm - Pager - [phone] After 6pm go to [email or web address] - password [email or web address] [facility]  Office  [phone]  CC: Primary care physician;  [name], MD  Note: This dictation was prepared with Dragon dictation along with smaller phrase technology. Any transcriptional errors that result from this process are unintentional.

## 2017-06-01 NOTE — Progress Notes (Signed)
Date: 06/01/2017,   MRN# [id number] Thomas Trujillo 01/04/1946 Code Status:     Code Status Orders        Start     Ordered   05/30/17 1829  Full code  Continuous     05/30/17 1838    Code Status History    Date Active Date Inactive Code Status Order ID Comments User Context   02/14/2017  6:13 PM 02/24/2017  7:26 PM Full Code [id number]  Miguel Aschoff Inpatient   02/14/2017  1:02 AM 02/14/2017 11:43 AM Full Code [id number]  Lance Coon, MD Inpatient    Advance Directive Documentation     Most Recent Value  Type of Advance Directive  Healthcare Power of [place], Living will  Pre-existing out of facility DNR order (yellow form or pink MOST form)  -  "MOST" Form in Place?  -     Hosp day:@LENGTHOFSTAYDAYS @ Referring MD: @ATDPROV @       CC: ptx. copd  HPI: this is a 71 yo male well known to my service who was seen in clinic with increase sob, vague left side pain, on cxr noted to have a ptx. Sent to ER. Admitted, chest tube placed. He is also known to have copd, see [place] notes. Presently comfortable. Chest xray shows re expansion.     PMHX:   Past Medical History:  Diagnosis Date  . 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 Coronary artery disease 02/14/2017   NSTEMI with urgent CABG (LIMA-LAD and SVG->ramus)  . Hyperlipidemia   . Ischemic cardiomyopathy   . Patient denies medical problems    Surgical Hx:  Past Surgical History:  Procedure Laterality Date  . ABDOMINAL HYSTERECTOMY    . CATARACT EXTRACTION, BILATERAL    . CORONARY ARTERY BYPASS GRAFT N/A 02/14/2017   Procedure: CORONARY ARTERY BYPASS GRAFTING (CABG) x 2 , using left internal mammary artery and right leg greater saphenous vein harvested endoscopically LIMA-LAD, SVG-RAMUS;  Surgeon: [name], MD;  Location: [facility];  Service: Open Heart Surgery;  Laterality: N/A;  . ENDOVEIN HARVEST OF GREATER SAPHENOUS VEIN Right 02/14/2017   Procedure: ENDOVEIN HARVEST OF RIGHT THIGH GREATER SAPHENOUS VEIN;  Surgeon: [name], MD;   Location: [facility];  Service: Open Heart Surgery;  Laterality: Right;  . LEFT HEART CATH AND CORONARY ANGIOGRAPHY N/A 02/14/2017   Procedure: Left Heart Cath and Coronary Angiography;  Surgeon: [name], MD;  Location: [facility];  Service: Cardiovascular;  Laterality: N/A;  . NASAL POLYP SURGERY    . STAPLING OF BLEBS N/A 02/14/2017   Procedure: STAPLING OF LARGE LEFT UPPER LOBE PULMONARY BLEB;  Surgeon: [name], MD;  Location: [facility];  Service: Open Heart Surgery;  Laterality: N/A;  . TEE WITHOUT CARDIOVERSION N/A 02/14/2017   Procedure: TRANSESOPHAGEAL ECHOCARDIOGRAM (TEE);  Surgeon: [name], MD;  Location: [facility];  Service: Open Heart Surgery;  Laterality: N/A;   Family Hx:  Family History  Problem Relation Age of Onset  . Obesity Son    Social Hx:   Social History  Substance Use Topics  . Smoking status: Former Smoker    Quit date: 04/06/2007  . Smokeless tobacco: Never Used  . Alcohol use No   Medication:    Home Medication:    Current Medication: @CURMEDTAB @   Allergies:  No known allergies  Review of Systems: Gen:  Denies  fever, sweats, chills HEENT: Denies blurred vision, double vision, ear pain, eye pain, hearing loss, nose bleeds, sore  throat Cvc:  No dizziness, chest pain or heaviness Resp:   Less sob, no hemoptysis  Gi: Denies swallowing difficulty, stomach pain, nausea or vomiting, diarrhea, constipation, bowel incontinence Gu:  Denies bladder incontinence, burning urine Ext:   No Joint pain, stiffness or swelling Skin: No skin rash, easy bruising or bleeding or hives Endoc:  No polyuria, polydipsia , polyphagia or weight change Psych: No depression, insomnia or hallucinations  Other:  All other systems negative  Physical Examination:   VS: BP (!) 106/48   Pulse 81   Temp 97.9 F (36.6 C) (Oral)   Resp 15   Ht 5\' 4"  (1.626 m)   Wt 153 lb 6.4 oz (69.6 kg)   SpO2 97%   BMI 26.33 kg/m   General Appearance: No distress   Neuropsych without focal findings, mental status, speech normal, alert and oriented, cranial nerves 2-12 intact, reflexes normal and symmetric, sensation grossly normal  HEENT: PERRLA, EOM intact, no ptosis, no other lesions noticed,  Neck: supple, no sub q emphysema: Pulmonary:.No wheezing, No rales  Sputum Production:   Cardiovascular:  Normal S1,S2.  No m/r/g.  Abdominal aorta pulsation normal.    Abdomen:Benign, Soft, non-tender, No masses, hepatosplenomegaly, No lymphadenopathy Endoc: No evident thyromegaly, no signs of acromegaly or Cushing features Skin:   warm, no rashes, no ecchymosis  Extremities: normal, no cyanosis, clubbing, no edema, warm with normal capillary refill. :   Labs results:   Recent Labs     05/30/17  1614  HGB  13.9  HCT  42.0  MCV  91.3  WBC  9.1  BUN  27*  CREATININE  1.13  GLUCOSE  115*  CALCIUM  9.5  ,    No results for input(s): PH in the last 72 hours.  Invalid input(s): PCO2, PO2, BASEEXCESS, BASEDEFICITE, TFT  Culture results:     Rad results:   Dg Chest Port 1 View  Result Date: 06/01/2017 CLINICAL DATA:  Postoperative radiograph.  Initial encounter. EXAM: PORTABLE CHEST 1 VIEW COMPARISON:  Chest radiograph performed 05/31/2017 FINDINGS: Mild left basilar opacity may reflect atelectasis. Chronic right midlung scarring is noted. The previously noted left basilar pneumothorax has resolved. The left-sided chest tube is unchanged in appearance. The cardiomediastinal silhouette is normal in size. The patient is status post median sternotomy, with evidence of prior CABG. No acute osseous abnormalities are seen. IMPRESSION: 1. Mild left basilar airspace opacity may reflect atelectasis. Chronic right midlung scarring again noted. 2. Left basilar pneumothorax has resolved. Left-sided chest tube unchanged in appearance. Electronically Signed   By: Garald Balding M.D.   On: 06/01/2017 05:35   Assessment and Plan: S/p Left pneumothorax, ct in place,  ptx resolved, less so General surgery following Clamp tube etc, may be able to come out Am cxr  He has Severe COPD, ex smoker, on oxygen. Stat cxr c/w 20 % 0r so ptx.  Continue duo neb, budenoside 0.5 bid via neb Continue oxygen at 2 liters Annual flu vaccine recommended Follow up in 3 months        I have personally obtained a history, examined the patient, evaluated laboratory and imaging results, formulated the assessment and plan and placed orders.  The Patient requires high complexity decision making for assessment and support, frequent evaluation and titration of therapies, application of advanced monitoring technologies and extensive interpretation of multiple databases.   Herbon Fleming,M.D. Pulmonary & Critical care Medicine [facility]

## 2017-06-01 NOTE — Progress Notes (Signed)
  Patient ID: Thomas Trujillo, male   DOB: Oct 25, 1945, 71 y.o.   MRN: [id number]  HISTORY: He states that his breathing has been improved. He is not short of breath this morning. He does have some discomfort with deep coughing.   Vitals:   06/01/17 1110 06/01/17 1226  BP:  (!) 106/48  Pulse:  81  Resp:  15  Temp:  97.9 F (36.6 C)  SpO2: 92% 97%     EXAM:    Resp: Lungs are Very distant bilaterally.  No respiratory distress, normal effort. Heart:  Regular without murmurs Abd:  Abdomen is soft, non distended and non tender. No masses are palpable.  There is no rebound and no guarding.  Neurological: Alert and oriented to person, place, and time. Coordination normal.  Skin: Skin is warm and dry. No rash noted. No diaphoretic. No erythema. No pallor.  Psychiatric: Normal mood and affect. Normal behavior. Judgment and thought content normal.    ASSESSMENT: I have independently reviewed the patient's chest x-ray from today. There is no pneumothorax. He does not have an air leak.   PLAN:   I have placed his chest tubes to waterseal. We will obtain another chest x-ray tomorrow. If his chest x-ray looks good I plan on removing the tube tomorrow.    Nestor Lewandowsky, MD

## 2017-06-01 NOTE — Progress Notes (Signed)
Dr Arther Abbott has asked hospitalist service to take over care of patient.

## 2017-06-01 NOTE — Evaluation (Signed)
Physical Therapy Evaluation Patient Details Name: Thomas Trujillo MRN: [id number] DOB: Sep 26, 1946 Today's Date: 06/01/2017   History of Present Illness  Patient is a 71 y/o male that presents with shortness of breath, noted to have ~30% collapse of L lung, chest tube was placed on 8/8. He has a history of COPD and had CABG less than 4 months ago, he was recently in cardiac rehab.   Clinical Impression  This evaluation took place on 05/31/2017, this note was initially unable to be entered due to network error with documentation system. Patient admitted with shortness of breath, found to have pneumothorax, chest tube was placed this morning. At baseline he is independent and had just completed cardiac rehab program. He requires no physical assistance to complete bed mobility/transfers on this date, though O2 sats did decrease from 95% to 90% on 2L while performing bed to chair transfer. His overall mobility status was limited this date by bed rest with bathroom privilidges order. It is anticipated he will tolerate increased mobility well once his mobility orders are changed. At this time given his performance this date, will recommend SNF placement, however PT will continue to follow and progress accordingly.     Follow Up Recommendations SNF    Equipment Recommendations  Rolling walker with 5" wheels    Recommendations for Other Services       Precautions / Restrictions Precautions Precaution Comments: Chest tube must stay connected to the wall Restrictions Weight Bearing Restrictions: No      Mobility  Bed Mobility Overal bed mobility: Independent             General bed mobility comments: HOB elevated, no deficits in transfer noted.   Transfers Overall transfer level: Needs assistance Equipment used: None Transfers: Sit to/from Stand Sit to Stand: Supervision         General transfer comment: Patient is able to perform sit to stand transfer with no assistance from  therapist, though prolonged time to complete.   Ambulation/Gait Ambulation/Gait assistance: Supervision Ambulation Distance (Feet): 3 Feet Assistive device: None Gait Pattern/deviations: WFL(Within Functional Limits)   Gait velocity interpretation: <1.8 ft/sec, indicative of risk for recurrent falls General Gait Details: Patient is on bed rest with bathroom priviliges, thus ambulation limited to the chair, which he was able to complete without complication. His O2 sats did decrease from 95% to 90%, however after 1-2 minutes these recovered to >95%.  Stairs            Wheelchair Mobility    Modified Rankin (Stroke Patients Only)       Balance Overall balance assessment: Independent                                           Pertinent Vitals/Pain Pain Assessment: Faces Faces Pain Scale: Hurts little more Pain Location: Chest near incision.  Pain Descriptors / Indicators: Aching;Operative site guarding Pain Intervention(s): Limited activity within patient's tolerance;Repositioned    Home Living Family/patient expects to be discharged to:: Private residence Living Arrangements: Alone Available Help at Discharge: Family;Available 24 hours/day Type of Home: Apartment Home Access: Level entry     Home Layout: One level Home Equipment: Grab bars - tub/shower;Walker - 2 wheels;Bedside commode      Prior Function Level of Independence: Independent         Comments: Patient had been independent, was driving and just completed cardiac rehab.  Hand Dominance   Dominant Hand: Right    Extremity/Trunk Assessment   Upper Extremity Assessment Upper Extremity Assessment: Overall WFL for tasks assessed    Lower Extremity Assessment Lower Extremity Assessment: Overall WFL for tasks assessed       Communication   Communication: No difficulties  Cognition Arousal/Alertness: Awake/alert Behavior During Therapy: WFL for tasks  assessed/performed Overall Cognitive Status: Within Functional Limits for tasks assessed                                        General Comments General comments (skin integrity, edema, etc.): Chest tube in place, maintained on suction to the wall throughout.     Exercises     Assessment/Plan    PT Assessment Patient needs continued PT services  PT Problem List Decreased strength;Decreased balance;Pain;Cardiopulmonary status limiting activity;Decreased activity tolerance       PT Treatment Interventions DME instruction;Functional mobility training;Balance training;Patient/family education;Gait training;Therapeutic activities;Neuromuscular re-education;Therapeutic exercise;Stair training    PT Goals (Current goals can be found in the Care Plan section)  Acute Rehab PT Goals Patient Stated Goal: To return home  PT Goal Formulation: With patient Time For Goal Achievement: 06/15/17 Potential to Achieve Goals: Good    Frequency Min 2X/week   Barriers to discharge Decreased caregiver support      Co-evaluation               AM-PAC PT "6 Clicks" Daily Activity  Outcome Measure Difficulty turning over in bed (including adjusting bedclothes, sheets and blankets)?: None Difficulty moving from lying on back to sitting on the side of the bed? : None Difficulty sitting down on and standing up from a chair with arms (e.g., wheelchair, bedside commode, etc,.)?: None Help needed moving to and from a bed to chair (including a wheelchair)?: A Little Help needed walking in hospital room?: A Little Help needed climbing 3-5 steps with a railing? : A Little 6 Click Score: 21    End of Session Equipment Utilized During Treatment: Gait belt Activity Tolerance: Patient tolerated treatment well Patient left: in chair;with chair alarm set;with call bell/phone within reach Nurse Communication: Mobility status      Time: 1355-1415 (On 05/31/2017) PT Time Calculation (min)  (ACUTE ONLY): 20 min   Charges:   PT Evaluation $PT Eval Moderate Complexity: 1 Mod     PT G Codes:   PT G-Codes **NOT FOR INPATIENT CLASS** Functional Assessment Tool Used: AM-PAC 6 Clicks Basic Mobility Functional Limitation: Mobility: Walking and moving around Mobility: Walking and Moving Around Current Status (T7001): At least 40 percent but less than 60 percent impaired, limited or restricted Mobility: Walking and Moving Around Goal Status [phone]): At least 20 percent but less than 40 percent impaired, limited or restricted    Royce Macadamia PT, DPT, CSCS    06/01/2017, 9:07 AM

## 2017-06-02 ENCOUNTER — Inpatient Hospital Stay: Payer: Medicare Other

## 2017-06-02 MED ORDER — PREDNISONE 10 MG PO TABS: ORAL_TABLET | ORAL | 0 refills | Status: DC

## 2017-06-02 NOTE — Discharge Instructions (Signed)
HHPT. Continue home O2 [place].

## 2017-06-02 NOTE — Care Management (Signed)
Patient admitted for PNX.  Patient lives at home alone.  Son lives locally for support.  PCP [facility].  Pharmacy [facility].  Chest tube was removed today.  Patient on RA.  Patient was evaluated by PT and recommended home health PT, however he ambulated 450 ft and does not meet home bound criteria.  Patient was being followed in the community for outpatient cardiopulmonary rehab.  Patient to have dry dressing changes every other day.  Bedside nurse to educate patient prior to discharge.  RNCM signing off

## 2017-06-02 NOTE — Progress Notes (Signed)
No new complaints. He denied any shortness of breath. He does have occasional discomfort with coughing or moving involving his left chest where the tube is.  I have independently reviewed his chest x-ray. There is no pneumothorax or pleural effusion. There is no air leak. There is only minimal serosanguineous drainage from the tube. The chest tube was removed without difficulty. Sterile dressing was applied. Discharge instructions were given. He should follow-up with me in one week with the chest x-ray.  He can remove the dressing in 48 hours and then wash over the wounds.

## 2017-06-02 NOTE — Progress Notes (Signed)
Pt to be discharged per MD order. IV removed. Instructions reviewed with pt and family with all questions answered. Scripts given to pt. Will discharge in wheelchair.

## 2017-06-02 NOTE — Discharge Summary (Addendum)
[facility] [place] NAME: Thomas Trujillo    MR#:  [id number]  DATE OF BIRTH:  05-Jan-1946  DATE OF ADMISSION:  05/30/2017   ADMITTING PHYSICIAN: Jules Husbands, MD  DATE OF DISCHARGE: 06/02/2017 PRIMARY CARE PHYSICIAN: [name], MD   ADMISSION DIAGNOSIS:  Spontaneous pneumothorax [J93.83] SOB (shortness of breath) [R06.02] Pneumothorax [J93.9] DISCHARGE DIAGNOSIS:  Active Problems:   Pneumothorax  SECONDARY DIAGNOSIS:   Past Medical History:  Diagnosis Date  . Bleb, lung ([place])   . Coronary artery disease 02/14/2017   NSTEMI with urgent CABG (LIMA-LAD and SVG->ramus)  . Hyperlipidemia   . Ischemic cardiomyopathy   . Patient denies medical problems    HOSPITAL COURSE:   71o-year-old male with a history of advance Bullous COPD, CAD status post CABG and hyperlipidemia who presents with shortness of breath and found to have moderate left-sided pneumothorax.  1. Moderate left-sided pneumothorax: UnderwentCT-guided left-sided chest tube pigtail placement 8/8 by IR DR. Oaks reviewed his chest x-ray today. There is no pneumothorax or pleural effusion. There is no air leak. There is only minimal serosanguineous drainage from the tube. The chest tube was removed without difficulty. The patient can be discharged to home today per Dr. Genevive Bi.  2. Advanced Bullous COPD: Wean IV steroids to oral prednisone.  3. Coronary artery disease status post 2 vessel CABG and ischemic cardiomyopathy, EF 30-35%: Continue aspirin, atorvastatin, Blood pressure is low and therefore discontinued metoprolol for now but follow-up PCP to resume. 4. BPH: Continue tamsulosin  5.HLD : Continue statin  DISCHARGE CONDITIONS:  Stable, discharge to home today. CONSULTS OBTAINED:  Treatment Team:  Jules Husbands, MD Bettey Costa, MD Erby Pian, MD DRUG ALLERGIES:   Allergies  Allergen Reactions  . No Known Allergies    DISCHARGE  MEDICATIONS:   Allergies as of 06/02/2017      Reactions   No Known Allergies       Medication List    STOP taking these medications   metoprolol tartrate 25 MG tablet Commonly known as:  LOPRESSOR     TAKE these medications   acetaminophen 500 MG tablet Commonly known as:  TYLENOL Take 500 mg by mouth every 6 (six) hours as needed.   aspirin 325 MG EC tablet Take 1 tablet (325 mg total) by mouth daily.   atorvastatin 40 MG tablet Commonly known as:  LIPITOR Take 1 tablet (40 mg total) by mouth daily.   bisacodyl 5 MG EC tablet Commonly known as:  DULCOLAX Take 2 tablets (10 mg total) by mouth daily as needed for moderate constipation (Give daily if no BM).   ferrous sulfate 325 (65 FE) MG tablet Take 325 mg by mouth daily with breakfast.   furosemide 20 MG tablet Commonly known as:  LASIX Take 1-2 tablets (20-40 mg total) by mouth as directed. Once daily with additional dose if he gains 2 pounds in a day or 5 pounds in a week. What changed:  Another medication with the same name was removed. Continue taking this medication, and follow the directions you see here.   ipratropium-albuterol 0.5-2.5 (3) MG/3ML Soln Commonly known as:  DUONEB Take 3 mLs by nebulization every 6 (six) hours as needed. When awake   KLOR-CON M10 10 MEQ tablet Generic drug:  potassium chloride TAKE 1 TABLET BY MOUTH EVERY DAY   OXYGEN Inhale into the lungs. By nasal cannula and wean as tolerated to keep O2 sats >90%  potassium chloride 10 MEQ tablet Commonly known as:  K-DUR Take 10 mEq by mouth daily.   predniSONE 10 MG tablet Commonly known as:  DELTASONE 40 mg po daily for 1 day, 20 mg po daily for 2 day, 10 mg po daily for 2 day.   tamsulosin 0.4 MG Caps capsule Commonly known as:  FLOMAX Take 1 capsule (0.4 mg total) by mouth daily.        DISCHARGE INSTRUCTIONS:  See AVS.  If you experience worsening of your admission symptoms, develop shortness of breath, life  threatening emergency, suicidal or homicidal thoughts you must seek medical attention immediately by calling 911 or calling your MD immediately  if symptoms less severe.  You Must read complete instructions/literature along with all the possible adverse reactions/side effects for all the Medicines you take and that have been prescribed to you. Take any new Medicines after you have completely understood and accpet all the possible adverse reactions/side effects.   Please note  You were cared for by a hospitalist during your hospital stay. If you have any questions about your discharge medications or the care you received while you were in the hospital after you are discharged, you can call the unit and asked to speak with the hospitalist on call if the hospitalist that took care of you is not available. Once you are discharged, your primary care physician will handle any further medical issues. Please note that NO REFILLS for any discharge medications will be authorized once you are discharged, as it is imperative that you return to your primary care physician (or establish a relationship with a primary care physician if you do not have one) for your aftercare needs so that they can reassess your need for medications and monitor your lab values.    On the day of Discharge:  VITAL SIGNS:  Blood pressure (!) 95/43, pulse 79, temperature 97.9 F (36.6 C), temperature source Oral, resp. rate 18, height 5\' 4"  (1.626 m), weight 153 lb 6.4 oz (69.6 kg), SpO2 96 %. PHYSICAL EXAMINATION:  GENERAL:  71 y.o.-year-old patient lying in the bed with no acute distress.  EYES: Pupils equal, round, reactive to light and accommodation. No scleral icterus. Extraocular muscles intact.  HEENT: Head atraumatic, normocephalic. Oropharynx and nasopharynx clear.  NECK:  Supple, no jugular venous distention. No thyroid enlargement, no tenderness.  LUNGS: Normal breath sounds bilaterally, no wheezing, rales,rhonchi or  crepitation. No use of accessory muscles of respiration.  CARDIOVASCULAR: S1, S2 normal. No murmurs, rubs, or gallops.  ABDOMEN: Soft, non-tender, non-distended. Bowel sounds present. No organomegaly or mass.  EXTREMITIES: No pedal edema, cyanosis, or clubbing.  NEUROLOGIC: Cranial nerves II through XII are intact. Muscle strength 5/5 in all extremities. Sensation intact. Gait not checked.  PSYCHIATRIC: The patient is alert and oriented x 3.  SKIN: No obvious rash, lesion, or ulcer.  DATA REVIEW:   CBC  Recent Labs Lab 05/30/17 1614  WBC 9.1  HGB 13.9  HCT 42.0  PLT 216    Chemistries   Recent Labs Lab 05/30/17 1614  NA 137  K 4.9  CL 104  CO2 23  GLUCOSE 115*  BUN 27*  CREATININE 1.13  CALCIUM 9.5     Microbiology Results  Results for orders placed or performed during the hospital encounter of 02/13/17  MRSA PCR Screening     Status: None   Collection Time: 02/14/17 12:42 AM  Result Value Ref Range Status   MRSA by PCR NEGATIVE NEGATIVE Final  Comment:        The GeneXpert MRSA Assay (FDA approved for NASAL specimens only), is one component of a comprehensive MRSA colonization surveillance program. It is not intended to diagnose MRSA infection nor to guide or monitor treatment for MRSA infections.     RADIOLOGY:  Dg Chest Port 1 View  Result Date: 06/02/2017 CLINICAL DATA:  Postoperative EXAM: PORTABLE CHEST 1 VIEW COMPARISON:  Chest radiograph from one day prior. FINDINGS: Left pigtail pleural catheter is stable with the tip in the peripheral upper left pleural space. Intact sternotomy wires. Stable cardiomediastinal silhouette with normal heart size and aortic atherosclerosis. No pneumothorax. No pleural effusion. Stable scarring versus atelectasis at the left greater than right lung bases. No pulmonary edema. No acute consolidative airspace disease. Advanced emphysema. IMPRESSION: 1. No pneumothorax.  Stable position of left chest tube. 2. Advanced  emphysema. Stable bibasilar scarring versus atelectasis. Electronically Signed   By: Ilona Sorrel M.D.   On: 06/02/2017 09:17     Management plans discussed with the patient, family and they are in agreement.  CODE STATUS: Full Code   TOTAL TIME TAKING CARE OF THIS PATIENT: 33 minutes.    Demetrios Loll M.D on 06/02/2017 at 2:31 PM  Between 7am to 6pm - Pager - [phone]  After 6pm go to [email or web address] - Proofreader  [facility]  Office  [phone]  CC: Primary care physician; [name], MD   Note: This dictation was prepared with Dragon dictation along with smaller phrase technology. Any transcriptional errors that result from this process are unintentional.

## 2017-06-02 NOTE — Care Management Important Message (Signed)
Important Message  Patient Details  Name: Thomas Trujillo MRN: [id number] Date of Birth: [birth year]   Medicare Important Message Given:  Yes    Beverly Sessions, RN 06/02/2017, 3:15 PM

## 2017-06-05 ENCOUNTER — Telehealth: Payer: Self-pay

## 2017-06-05 ENCOUNTER — Other Ambulatory Visit: Payer: Self-pay

## 2017-06-05 DIAGNOSIS — J9383 Other pneumothorax: Secondary | ICD-10-CM

## 2017-06-05 NOTE — Telephone Encounter (Signed)
Patients son called at this time to say his dad had a large bowel movement. He wanted to know if his dad should drink the magnesium citrate and was told no. He was instructed to drink 72 ounces of water daily and to continue to eating his prunes.

## 2017-06-05 NOTE — Telephone Encounter (Signed)
Post-op call made to patient at this time. Spoke with patients son. Post-op interview questions below.  1. How are you feeling? good  2. Is your pain controlled? no  3. What are you doing for the pain? no  4. Are you having any Nausea or Vomiting?yes, nauseated  5. Are you having any Fever or Chills? no  6. Are you having any Constipation or Diarrhea? constipation  7. Is there any Swelling or Bruising you are concerned about? no  8. Do you have any questions or concerns at this time? Constipation   Discussion:  Last bowel movement before last Monday. Patient's son stated his dad has only passed 2-3 hard stool nuggets.  Patient instructed to drink full bottle of Magnesium Citrate and increase water 72 ounces daily and if he has rectal pressure to use Fleets enema. If no results then call office, otherwise he has appointment with dr.oaks on 8/17/ @ 8 am.

## 2017-06-07 ENCOUNTER — Ambulatory Visit (INDEPENDENT_AMBULATORY_CARE_PROVIDER_SITE_OTHER): Payer: Medicare Other | Admitting: Internal Medicine

## 2017-06-07 ENCOUNTER — Encounter: Payer: Self-pay | Admitting: Internal Medicine

## 2017-06-07 VITALS — BP 112/62 | HR 82 | Ht 64.0 in | Wt 150.8 lb

## 2017-06-07 DIAGNOSIS — I2510 Atherosclerotic heart disease of native coronary artery without angina pectoris: Secondary | ICD-10-CM | POA: Diagnosis not present

## 2017-06-07 DIAGNOSIS — J441 Chronic obstructive pulmonary disease with (acute) exacerbation: Secondary | ICD-10-CM | POA: Insufficient documentation

## 2017-06-07 DIAGNOSIS — J449 Chronic obstructive pulmonary disease, unspecified: Secondary | ICD-10-CM | POA: Diagnosis not present

## 2017-06-07 DIAGNOSIS — J939 Pneumothorax, unspecified: Secondary | ICD-10-CM

## 2017-06-07 DIAGNOSIS — I255 Ischemic cardiomyopathy: Secondary | ICD-10-CM

## 2017-06-07 DIAGNOSIS — E782 Mixed hyperlipidemia: Secondary | ICD-10-CM

## 2017-06-07 NOTE — Patient Instructions (Signed)
Medication Instructions:  Your physician recommends that you continue on your current medications as directed. Please refer to the Current Medication list given to you today.   Labwork: If you have lab work drawn at your Primary Care Physician, please have them fax it to [place] at [phone].  Testing/Procedures: none  Follow-Up: Your physician recommends that you schedule a follow-up appointment in: 3 MONTHS WITH DR END.  If you need a refill on your cardiac medications before your next appointment, please call your pharmacy.

## 2017-06-07 NOTE — Progress Notes (Signed)
Follow-up Outpatient Visit Date: 06/07/2017  Primary Care Provider: [name], MD [facility] [place] [address]  Chief Complaint: Follow-up coronary artery disease  HPI:  [name] is a 71 y.o. year-old male with history of coronary artery disease with recent NSTEMI and urgent CABG in 01/2017, who presents for follow-up of coronary artery disease and shortness of breath. I last saw him in June, at which time he was gradually recovering from his CABG. He continued to have some shortness of breath prompting [place] to obtain a transthoracic echocardiogram. LVEF had returned to normal with grade 1 diastolic dysfunction. Due to leg swelling, we also obtain lower extremity venous duplex studies which were negative for DVT. He was admitted last week with worsening shortness of breath and found to have a pneumothorax that was treated with CT-guided chest tube placement.  Today, [name] reports that he is feeling fairly well. He has not had any significant shortness of breath unless he walks on a treadmill. He is still a supplemental oxygen when active. He recently finished cardiac rehabilitation and is scheduled to begin pulmonary rehabilitation under the direction of Dr. Raul Del. He denies chest pain other than soreness at the site of recent chest tube placement. He also has some chronic left anterior chest wall tenderness following his CABG. He notes occasional mild swelling of the ankles and instep, left greater than right. He denies palpitations and lightheadedness. He states that his weight has been stable. He notes that his blood pressures are typically lowered normal in the mornings, ranging from 17-408 systolic. He has not had any lightheadedness or falls.  --------------------------------------------------------------------------------------------------  Cardiovascular History & Procedures: Cardiovascular Problems:  Coronary artery disease s/p high-risk NSTEMI and urgent CABG  (01/2017)  Risk Factors:  Known coronary artery disease, male gender, and age > 1.  Cath/PCI:  LHC (02/14/17): LMCA with 20% distal stenosis. LAD with 95% ostial lesion and 80-90% midvessel stenosis. Moderate-caliber ramus with 80% ostial stenosis. Codominant LCx and RCA without significant disease. LVEF 40-45% with mid and apical anterior hypokinesis.  CV Surgery:  CABG (02/14/17, Dr. Servando Snare): LIMA->LAD, and SVG->ramus  EP Procedures and Devices:  None  Non-Invasive Evaluation(s):  Lower extremity venous duplex (04/28/17): No evidence of DVT.  TTE (04/21/17): Poor acoustic windows. Normal LV size with mild LVH. LVEF 55-60%. Grade 1 diastolic dysfunction. Mildly dilated right ventricle with normal wall thickness and contraction. No significant valvular abnormalities.  TEE (02/14/17, intraoperative): LVEF 35-40% with trace MR.  Recent CV Pertinent Labs: Lab Results  Component Value Date   CHOL 114 02/14/2017   HDL 39 (L) 02/14/2017   LDLCALC 65 02/14/2017   TRIG 51 02/14/2017   CHOLHDL 2.9 02/14/2017   INR 1.62 02/14/2017   BNP 945.8 (H) 02/17/2017   K 4.9 05/30/2017   MG 2.3 02/15/2017   BUN 27 (H) 05/30/2017   BUN 23 04/05/2017   CREATININE 1.13 05/30/2017    Past medical and surgical history were reviewed and updated in EPIC.  Outpatient Encounter Prescriptions as of 06/07/2017  Medication Sig  . acetaminophen (TYLENOL) 500 MG tablet Take 500 mg by mouth every 6 (six) hours as needed.  [name] aspirin 325 MG EC tablet Take 1 tablet (325 mg total) by mouth daily.  [name] atorvastatin (LIPITOR) 40 MG tablet Take 1 tablet (40 mg total) by mouth daily.  . bisacodyl (DULCOLAX) 5 MG EC tablet Take 2 tablets (10 mg total) by mouth daily as needed for moderate constipation (Give daily if no  BM).  . ferrous sulfate 325 (65 FE) MG tablet Take 325 mg by mouth daily with breakfast.  . furosemide (LASIX) 20 MG tablet Take 1-2 tablets (20-40 mg total) by mouth as directed. Once daily with  additional dose if he gains 2 pounds in a day or 5 pounds in a week.  [name] ipratropium-albuterol (DUONEB) 0.5-2.5 (3) MG/3ML SOLN Take 3 mLs by nebulization every 6 (six) hours as needed. When awake   . KLOR-CON M10 10 MEQ tablet TAKE 1 TABLET BY MOUTH EVERY DAY (Patient not taking: Reported on 05/30/2017)  . OXYGEN Inhale into the lungs. By nasal cannula and wean as tolerated to keep O2 sats >90%  . potassium chloride (K-DUR) 10 MEQ tablet Take 10 mEq by mouth daily.  . predniSONE (DELTASONE) 10 MG tablet 40 mg po daily for 1 day, 20 mg po daily for 2 day, 10 mg po daily for 2 day.  . tamsulosin (FLOMAX) 0.4 MG CAPS capsule Take 1 capsule (0.4 mg total) by mouth daily.   No facility-administered encounter medications on file as of 06/07/2017.     Allergies: No known allergies  Social History   Social History  . Marital status: Divorced    Spouse name: N/A  . Number of children: N/A  . Years of education: N/A   Occupational History  . Not on file.   Social History Main Topics  . Smoking status: Former Smoker    Quit date: 04/06/2007  . Smokeless tobacco: Never Used  . Alcohol use No  . Drug use: No  . Sexual activity: Not on file   Other Topics Concern  . Not on file   Social History Narrative  . No narrative on file    Family History  Problem Relation Age of Onset  . Obesity Son     Review of Systems: A 12-system review of systems was performed and was negative except as noted in the HPI.  --------------------------------------------------------------------------------------------------  Physical Exam: BP 112/62 (BP Location: Left Arm, Patient Position: Sitting, Cuff Size: Normal)   Pulse 82   Ht 5\' 4"  (1.626 m)   Wt 150 lb 12 oz (68.4 kg)   BMI 25.88 kg/m   General:  Well-developed, well-nourished man seated comfortably in the exam room. He is accompanied by his son. HEENT: No conjunctival pallor or scleral icterus.  Moist mucous membranes.  OP clear. Neck:  Supple without lymphadenopathy, thyromegaly, JVD, or HJR. Lungs: Normal work of breathing. Mildly diminished breath sounds throughout. No wheezes or crackles. Heart: Regular rate and rhythm without murmurs, rubs, or gallops.  Non-displaced PMI. Abd: Bowel sounds present.  Soft, NT/ND without hepatosplenomegaly Ext: No lower extremity edema.  Radial, PT, and DP pulses are 2+ bilaterally. Skin: 1+ left and trace right ankle edema. Median sternotomy is well-healed. Left chest tube site is covered with a clean dressing.  EKG: Normal sinus rhythm without significant abnormalities.  Lab Results  Component Value Date   WBC 9.1 05/30/2017   HGB 13.9 05/30/2017   HCT 42.0 05/30/2017   MCV 91.3 05/30/2017   PLT 216 05/30/2017    Lab Results  Component Value Date   NA 137 05/30/2017   K 4.9 05/30/2017   CL 104 05/30/2017   CO2 23 05/30/2017   BUN 27 (H) 05/30/2017   CREATININE 1.13 05/30/2017   GLUCOSE 115 (H) 05/30/2017   ALT 19 04/05/2017    Lab Results  Component Value Date   CHOL 114 02/14/2017   HDL 39 (L) 02/14/2017  Myrtle 65 02/14/2017   TRIG 51 02/14/2017   CHOLHDL 2.9 02/14/2017    --------------------------------------------------------------------------------------------------  ASSESSMENT AND PLAN: Coronary artery disease without angina Mr. Bingaman continues to recover well. He has completed cardiac rehabilitation and denies further angina. Shortness of breath, most likely related to underlying lung disease, continues to improve. We will continue his current regimen for secondary prevention. I am hesitant to add a beta blocker given his soft blood pressures and underlying obstructive lung disease.  Ischemic cardiomyopathy Mr. Shoreas has chronic leg edema, which may be related to venous insufficiency. His weights have been stable. We will continue with his current dose of furosemide 20 mg daily. Echo after her last visit showed normalization of his LV systolic  function. No further medication changes at this time.  Hyperlipidemia Most recent lipid panel at time of MI was notable for an LDL 65. [name] is tolerating atorvastatin 40 mg daily well. We will continue his current regimen. He reports having upcoming labs with his PCP. Lipid panel should be checked to ensure adequate response to current therapy.  COPD and recent left pneumothorax Breathing is gradually improving. I will defer further management to Drs. Raul Del and [place].  Follow-up: Return to clinic in 3 months.  [name], MD 06/07/2017 1:21 PM

## 2017-06-09 ENCOUNTER — Ambulatory Visit
Admission: RE | Admit: 2017-06-09 | Discharge: 2017-06-09 | Disposition: A | Discharge status: Home / Self Care | Payer: Medicare Other | Source: Ambulatory Visit | Attending: Cardiothoracic Surgery | Admitting: Cardiothoracic Surgery

## 2017-06-09 ENCOUNTER — Encounter: Payer: Medicare Other | Admitting: *Deleted

## 2017-06-09 ENCOUNTER — Encounter: Payer: Self-pay | Admitting: Cardiothoracic Surgery

## 2017-06-09 ENCOUNTER — Ambulatory Visit (INDEPENDENT_AMBULATORY_CARE_PROVIDER_SITE_OTHER): Payer: Medicare Other | Admitting: Cardiothoracic Surgery

## 2017-06-09 VITALS — BP 115/70 | HR 82 | Temp 98.2°F | Resp 22 | Ht 64.0 in | Wt 153.2 lb

## 2017-06-09 DIAGNOSIS — Z79899 Other long term (current) drug therapy: Secondary | ICD-10-CM | POA: Diagnosis not present

## 2017-06-09 DIAGNOSIS — J9383 Other pneumothorax: Secondary | ICD-10-CM | POA: Diagnosis present

## 2017-06-09 DIAGNOSIS — J449 Chronic obstructive pulmonary disease, unspecified: Secondary | ICD-10-CM | POA: Insufficient documentation

## 2017-06-09 DIAGNOSIS — Z951 Presence of aortocoronary bypass graft: Secondary | ICD-10-CM | POA: Diagnosis not present

## 2017-06-09 DIAGNOSIS — I214 Non-ST elevation (NSTEMI) myocardial infarction: Secondary | ICD-10-CM | POA: Diagnosis not present

## 2017-06-09 NOTE — Progress Notes (Signed)
Daily Session Note  Patient Details  Name: Thomas Trujillo MRN: [id number] Date of Birth: 07/12/1946 Referring Provider:     Pulmonary Rehab from 05/30/2017 in [facility]  Referring Provider  Fleming, [place] MD      Encounter Date: 06/09/2017  Check In:     Session Check In - 06/09/17 1059      Check-In   Location [facility]   Staff Present Austin Herd, RN, BSN;Joseph Hood RCP,RRT,BSRT;Amanda Sommer, BA, ACSM CEP, Exercise Physiologist   Supervising physician immediately available to respond to emergencies [facility] immediately available ER MD   Physician(s) Dr. Rifenbark and Dr. Kinner   Medication changes reported     No   Fall or balance concerns reported    No   Tobacco Cessation No Change   Warm-up and Cool-down Performed as group-led instruction   Resistance Training Performed Yes   VAD Patient? No     Pain Assessment   Currently in Pain? No/denies         History  Smoking Status  . Former Smoker  . Quit date: 04/06/2007  Smokeless Tobacco  . Never Used    Goals Met:  Proper associated with RPD/PD & O2 Sat Exercise tolerated well Strength training completed today  Goals Unmet:  Not Applicable  Comments:     Dr. Mark Miller is Medical Director for [facility] and [facility]. 

## 2017-06-09 NOTE — Progress Notes (Signed)
Daily Session Note  Patient Details  Name: Thomas Trujillo MRN: [id number] Date of Birth: [birth year] Referring Provider:     Pulmonary Rehab from 05/30/2017 in [facility]  Referring Provider  Ancil Linsey MD      Encounter Date: 06/09/2017  Check In:     Session Check In - 06/09/17 1059      Check-In   Location [facility]   Staff Present Gerlene Burdock, RN, BSN;Joseph Hood RCP,RRT,BSRT;Amanda Oletta Darter, [place], ACSM CEP, Exercise Physiologist   Supervising physician immediately available to respond to emergencies [facility] immediately available ER MD   Physician(s) Dr. Mable Paris and Dr. Corky Downs   Medication changes reported     No   Fall or balance concerns reported    No   Tobacco Cessation No Change   Warm-up and Cool-down Performed as group-led instruction   Resistance Training Performed Yes   VAD Patient? No     Pain Assessment   Currently in Pain? No/denies         History  Smoking Status  . Former Smoker  . Quit date: 04/06/2007  Smokeless Tobacco  . Never Used    Goals Met:  Proper associated with RPD/PD & O2 Sat Exercise tolerated well No report of cardiac concerns or symptoms Strength training completed today  Goals Unmet:  Not Applicable  Comments:  First full day of exercise!  Patient was oriented to gym and equipment including functions, settings, policies, and procedures.  Patient's individual exercise prescription and treatment plan were reviewed.  All starting workloads were established based on the results of the 6 minute walk test done at initial orientation visit.  The plan for exercise progression was also introduced and progression will be customized based on patient's performance and goals.   Dr. Emily Filbert is Medical Director for [facility] and [facility].

## 2017-06-09 NOTE — Progress Notes (Signed)
Daily Session Note  Patient Details  Name: Thomas Trujillo MRN: [id number] Date of Birth: 07/25/1946 Referring Provider:     Pulmonary Rehab from 05/30/2017 in [facility]  Referring Provider  Ancil Linsey MD      Encounter Date: 06/09/2017  Check In:     Session Check In - 06/09/17 1059      Check-In   Location [facility]   Staff Present Gerlene Burdock, RN, BSN;Joseph Hood RCP,RRT,BSRT;Amanda Oletta Darter, [place], ACSM CEP, Exercise Physiologist   Supervising physician immediately available to respond to emergencies [facility] immediately available ER MD   Physician(s) Dr. Mable Paris and Dr. Corky Downs   Medication changes reported     No   Fall or balance concerns reported    No   Tobacco Cessation No Change   Warm-up and Cool-down Performed as group-led instruction   Resistance Training Performed Yes   VAD Patient? No     Pain Assessment   Currently in Pain? No/denies         History  Smoking Status  . Former Smoker  . Quit date: 04/06/2007  Smokeless Tobacco  . Never Used    Goals Met:  Proper associated with RPD/PD & O2 Sat Exercise tolerated well Strength training completed today  Goals Unmet:  Not Applicable  Comments:     Dr. Emily Filbert is Medical Director for [facility] and [facility].

## 2017-06-09 NOTE — Patient Instructions (Signed)
We have seen you for follow-up of your Pneumothorax today. We do not need to see you back for future follow-ups unless your condition worsens. Please call our office with any questions or concerns.   Pneumothorax A pneumothorax, commonly called a collapsed lung, is a condition in which air leaks from a lung and builds up in the space between the lung and the chest wall (pleural space). The air in a pneumothorax is trapped outside the lung and takes up space, preventing the lung from fully expanding. This is a condition that usually occurs suddenly. The buildup of air may be small or large. A small pneumothorax may go away on its own. When a pneumothorax is larger, it will often require medical treatment and hospitalization. What are the causes? A pneumothorax can sometimes happen quickly with no apparent cause. People with underlying lung problems, particularly COPD or emphysema, are at higher risk of pneumothorax. However, pneumothorax can happen quickly even in people with no prior known lung problems. Trauma, surgery, medical procedures, or injury to the chest wall can also cause a pneumothorax. What are the signs or symptoms? Sometimes a pneumothorax will have no symptoms. When symptoms are present, they can include:  Chest pain.  Shortness of breath.  Increased rate of breathing.  Bluish color to your lips or skin (cyanosis).  How is this diagnosed? Pneumothorax is usually diagnosed by a chest X-ray or chest CT scan. Your health care provider will also take a medical history and perform a physical exam to determine why you may have a pneumothorax. How is this treated? A small pneumothorax may go away on its own without treatment. Extra oxygen can sometimes help a small pneumothorax go away more quickly. For a larger pneumothorax or a pneumothorax that is causing symptoms, a procedure is usually needed to drain the air.In some cases, the health care provider may drain the air using a  needle. In other cases, a chest tube may be inserted into the pleural space. A chest tube is a small tube placed between the ribs and into the pleural space. This removes the extra air and allows the lung to expand back to its normal size. A large pneumothorax will usually require a hospital stay. If there is ongoing air leakage into the pleural space, then the chest tube may need to remain in place for several days until the air leak has healed. In some cases, surgery may be needed. Follow these instructions at home:  Only take over-the-counter or prescription medicines as directed by your health care provider.  If a cough or pain makes it difficult for you to sleep at night, try sleeping in a semi-upright position in a recliner or by using 2 or 3 pillows.  Rest and limit activity as directed by your health care provider.  If you had a chest tube and it was removed, ask your health care provider when it is okay to remove the dressing. Until your health care provider says you can remove the dressing, do not allow it to get wet.  Do not smoke. Smoking is a risk factor for pneumothorax.  Do not fly in an airplane or scuba dive until your health care provider says it is okay.  Follow up with your health care provider as directed. Get help right away if:  You have increasing chest pain or shortness of breath.  You have a cough that is not controlled with suppressants.  You begin coughing up blood.  You have pain that is  getting worse or is not controlled with medicines.  You cough up thick, discolored mucus (sputum) that is yellow to green in color.  You have redness, increasing pain, or discharge at the site where a chest tube had been in place (if your pneumothorax was treated with a chest tube).  The site where your chest tube was located opens up.  You feel air coming out of the site where the chest tube was placed.  You have a fever or persistent symptoms for more than 2-3  days.  You have a fever and your symptoms suddenly get worse. This information is not intended to replace advice given to you by your health care provider. Make sure you discuss any questions you have with your health care provider. Document Released: 10/10/2005 Document Revised: 03/17/2016 Document Reviewed: 03/05/2014 Elsevier Interactive Patient Education  2018 [employer].  Chronic Obstructive Pulmonary Disease Chronic obstructive pulmonary disease (COPD) is a common lung condition in which airflow from the lungs is limited. COPD is a general term that can be used to describe many different lung problems that limit airflow, including both chronic bronchitis and emphysema. If you have COPD, your lung function will probably never return to normal, but there are measures you can take to improve lung function and make yourself feel better. What are the causes?  Smoking (common).  Exposure to secondhand smoke.  Genetic problems.  Chronic inflammatory lung diseases or recurrent infections. What are the signs or symptoms?  Shortness of breath, especially with physical activity.  Deep, persistent (chronic) cough with a large amount of thick mucus.  Wheezing.  Rapid breaths (tachypnea).  Gray or bluish discoloration (cyanosis) of the skin, especially in your fingers, toes, or lips.  Fatigue.  Weight loss.  Frequent infections or episodes when breathing symptoms become much worse (exacerbations).  Chest tightness. How is this diagnosed? Your health care provider will take a medical history and perform a physical examination to diagnose COPD. Additional tests for COPD may include:  Lung (pulmonary) function tests.  Chest X-ray.  CT scan.  Blood tests.  How is this treated? Treatment for COPD may include:  Inhaler and nebulizer medicines. These help manage the symptoms of COPD and make your breathing more comfortable.  Supplemental oxygen. Supplemental oxygen is only  helpful if you have a low oxygen level in your blood.  Exercise and physical activity. These are beneficial for nearly all people with COPD.  Lung surgery or transplant.  Nutrition therapy to gain weight, if you are underweight.  Pulmonary rehabilitation. This may involve working with a team of health care providers and specialists, such as respiratory, occupational, and physical therapists.  Follow these instructions at home:  Take all medicines (inhaled or pills) as directed by your health care provider.  Avoid over-the-counter medicines or cough syrups that dry up your airway (such as antihistamines) and slow down the elimination of secretions unless instructed otherwise by your health care provider.  If you are a smoker, the most important thing that you can do is stop smoking. Continuing to smoke will cause further lung damage and breathing trouble. Ask your health care provider for help with quitting smoking. He or she can direct you to community resources or hospitals that provide support.  Avoid exposure to irritants such as smoke, chemicals, and fumes that aggravate your breathing.  Use oxygen therapy and pulmonary rehabilitation if directed by your health care provider. If you require home oxygen therapy, ask your health care provider whether you should  purchase a pulse oximeter to measure your oxygen level at home.  Avoid contact with individuals who have a contagious illness.  Avoid extreme temperature and humidity changes.  Eat healthy foods. Eating smaller, more frequent meals and resting before meals may help you maintain your strength.  Stay active, but balance activity with periods of rest. Exercise and physical activity will help you maintain your ability to do things you want to do.  Preventing infection and hospitalization is very important when you have COPD. Make sure to receive all the vaccines your health care provider recommends, especially the pneumococcal and  influenza vaccines. Ask your health care provider whether you need a pneumonia vaccine.  Learn and use relaxation techniques to manage stress.  Learn and use controlled breathing techniques as directed by your health care provider. Controlled breathing techniques include: 1. Pursed lip breathing. Start by breathing in (inhaling) through your nose for 1 second. Then, purse your lips as if you were going to whistle and breathe out (exhale) through the pursed lips for 2 seconds. 2. Diaphragmatic breathing. Start by putting one hand on your abdomen just above your waist. Inhale slowly through your nose. The hand on your abdomen should move out. Then purse your lips and exhale slowly. You should be able to feel the hand on your abdomen moving in as you exhale.  Learn and use controlled coughing to clear mucus from your lungs. Controlled coughing is a series of short, progressive coughs. The steps of controlled coughing are: 1. Lean your head slightly forward. 2. Breathe in deeply using diaphragmatic breathing. 3. Try to hold your breath for 3 seconds. 4. Keep your mouth slightly open while coughing twice. 5. Spit any mucus out into a tissue. 6. Rest and repeat the steps once or twice as needed. Contact a health care provider if:  You are coughing up more mucus than usual.  There is a change in the color or thickness of your mucus.  Your breathing is more labored than usual.  Your breathing is faster than usual. Get help right away if:  You have shortness of breath while you are resting.  You have shortness of breath that prevents you from: ? Being able to talk. ? Performing your usual physical activities.  You have chest pain lasting longer than 5 minutes.  Your skin color is more cyanotic than usual.  You measure low oxygen saturations for longer than 5 minutes with a pulse oximeter. This information is not intended to replace advice given to you by your health care provider. Make  sure you discuss any questions you have with your health care provider. Document Released: 07/20/2005 Document Revised: 03/17/2016 Document Reviewed: 06/06/2013 Elsevier Interactive Patient Education  2017 [employer].

## 2017-06-12 DIAGNOSIS — J449 Chronic obstructive pulmonary disease, unspecified: Secondary | ICD-10-CM

## 2017-06-12 DIAGNOSIS — I214 Non-ST elevation (NSTEMI) myocardial infarction: Secondary | ICD-10-CM | POA: Diagnosis not present

## 2017-06-12 NOTE — Progress Notes (Signed)
Daily Session Note  Patient Details  Name: Jaecion Dempster MRN: [id number] Date of Birth: 29-Jan-1946 Referring Provider:     Pulmonary Rehab from 05/30/2017 in [facility] Cardiac and Pulmonary Rehab  Referring Provider  Ancil Linsey MD      Encounter Date: 06/12/2017  Check In:     Session Check In - 06/12/17 1011      Check-In   Location [facility]-Cardiac & Pulmonary Rehab   Staff Present Gerlene Burdock, RN, Moises Blood, BS, ACSM CEP, Exercise Physiologist;Rekisha Welling Flavia Shipper   Supervising physician immediately available to respond to emergencies [facility] immediately available ER MD   Physician(s) Dr. Mable Paris and Jimmye Norman   Medication changes reported     No   Fall or balance concerns reported    No   Warm-up and Cool-down Performed as group-led instruction   Resistance Training Performed Yes   VAD Patient? No     Pain Assessment   Currently in Pain? No/denies   Multiple Pain Sites No         History  Smoking Status  . Former Smoker  . Quit date: 04/06/2007  Smokeless Tobacco  . Never Used    Goals Met:  Proper associated with RPD/PD & O2 Sat Independence with exercise equipment Exercise tolerated well No report of cardiac concerns or symptoms Strength training completed today  Goals Unmet:  Not Applicable  Comments: Pt able to follow exercise prescription today without complaint.  Will continue to monitor for progression.   Dr. Emily Filbert is Medical Director for [place] and [facility].

## 2017-06-14 DIAGNOSIS — I214 Non-ST elevation (NSTEMI) myocardial infarction: Secondary | ICD-10-CM | POA: Diagnosis not present

## 2017-06-14 DIAGNOSIS — J449 Chronic obstructive pulmonary disease, unspecified: Secondary | ICD-10-CM

## 2017-06-14 NOTE — Progress Notes (Signed)
Daily Session Note  Patient Details  Name: Jacub Waiters MRN: [id number] Date of Birth: [birth year] Referring Provider:     Pulmonary Rehab from 05/30/2017 in [facility] Cardiac and Pulmonary Rehab  Referring Provider  Ancil Linsey MD      Encounter Date: 06/14/2017  Check In:     Session Check In - 06/14/17 1010      Check-In   Location [facility]-Cardiac & Pulmonary Rehab   Staff Present Alberteen Sam, MA, ACSM RCEP, Exercise Physiologist;Amanda Oletta Darter, BA, ACSM CEP, Exercise Physiologist;Joseph Flavia Shipper   Supervising physician immediately available to respond to emergencies [facility] immediately available ER MD   Physician(s) Dr. Jimmye Norman and Mariea Clonts   Medication changes reported     No   Fall or balance concerns reported    No   Warm-up and Cool-down Performed as group-led instruction   Resistance Training Performed Yes   VAD Patient? No     Pain Assessment   Currently in Pain? No/denies   Multiple Pain Sites No         History  Smoking Status  . Former Smoker  . Quit date: 04/06/2007  Smokeless Tobacco  . Never Used    Goals Met:  Proper associated with RPD/PD & O2 Sat Exercise tolerated well No report of cardiac concerns or symptoms Strength training completed today  Goals Unmet:  Not Applicable  Comments: Pt able to follow exercise prescription today without complaint.  Will continue to monitor for progression.   Dr. Emily Filbert is Medical Director for [place] and [facility].

## 2017-06-16 ENCOUNTER — Encounter: Payer: Medicare Other | Admitting: *Deleted

## 2017-06-16 DIAGNOSIS — J449 Chronic obstructive pulmonary disease, unspecified: Secondary | ICD-10-CM

## 2017-06-16 DIAGNOSIS — I214 Non-ST elevation (NSTEMI) myocardial infarction: Secondary | ICD-10-CM | POA: Diagnosis not present

## 2017-06-16 NOTE — Progress Notes (Signed)
Daily Session Note  Patient Details  Name: Thomas Trujillo MRN: [id number] Date of Birth: 09-Jan-1946 Referring Provider:     Pulmonary Rehab from 05/30/2017 in [facility] Cardiac and Pulmonary Rehab  Referring Provider  Ancil Linsey MD      Encounter Date: 06/16/2017  Check In:     Session Check In - 06/16/17 1023      Check-In   Location [facility]-Cardiac & Pulmonary Rehab   Staff Present Gerlene Burdock, RN, BSN;Joseph Hood RCP,RRT,BSRT;Amanda Oletta Darter, [place], ACSM CEP, Exercise Physiologist   Supervising physician immediately available to respond to emergencies [facility] immediately available ER MD   Physician(s) Dr. Burlene Arnt and Alfred Levins   Medication changes reported     No   Fall or balance concerns reported    No   Tobacco Cessation No Change   Warm-up and Cool-down Performed as group-led instruction   Resistance Training Performed Yes   VAD Patient? No     Pain Assessment   Currently in Pain? No/denies         History  Smoking Status  . Former Smoker  . Quit date: 04/06/2007  Smokeless Tobacco  . Never Used    Goals Met:  Proper associated with RPD/PD & O2 Sat Exercise tolerated well Strength training completed today  Goals Unmet:  Not Applicable  Comments:     Dr. Emily Filbert is Medical Director for [place] and [facility].

## 2017-06-19 ENCOUNTER — Encounter: Payer: Medicare Other | Admitting: *Deleted

## 2017-06-19 DIAGNOSIS — J449 Chronic obstructive pulmonary disease, unspecified: Secondary | ICD-10-CM

## 2017-06-19 DIAGNOSIS — I214 Non-ST elevation (NSTEMI) myocardial infarction: Secondary | ICD-10-CM | POA: Diagnosis not present

## 2017-06-19 NOTE — Progress Notes (Signed)
Daily Session Note  Patient Details  Name: Thomas Trujillo MRN: [id number] Date of Birth: Feb 28, 1946 Referring Provider:     Pulmonary Rehab from 05/30/2017 in [facility] Cardiac and Pulmonary Rehab  Referring Provider  Ancil Linsey MD      Encounter Date: 06/19/2017  Check In:     Session Check In - 06/19/17 1035      Check-In   Location [facility]-Cardiac & Pulmonary Rehab   Staff Present Nada Maclachlan, BA, ACSM CEP, Exercise Physiologist;Gryffin Altice Amedeo Plenty, BS, ACSM CEP, Exercise Physiologist;Joseph Flavia Shipper   Supervising physician immediately available to respond to emergencies [facility] immediately available ER MD   Physician(s) Dr. Mable Paris and Dr. Jimmye Norman   Medication changes reported     No   Fall or balance concerns reported    No   Warm-up and Cool-down Performed as group-led instruction   Resistance Training Performed Yes   VAD Patient? No     Pain Assessment   Currently in Pain? No/denies   Multiple Pain Sites No         History  Smoking Status  . Former Smoker  . Quit date: 04/06/2007  Smokeless Tobacco  . Never Used    Goals Met:  Proper associated with RPD/PD & O2 Sat Independence with exercise equipment Exercise tolerated well Strength training completed today  Goals Unmet:  Not Applicable  Comments: Pt able to follow exercise prescription today without complaint.  Will continue to monitor for progression.    Dr. Emily Filbert is Medical Director for [place] and [facility].

## 2017-06-19 NOTE — Progress Notes (Signed)
Pulmonary Individual Treatment Plan  Patient Details  Name: Theodore Virgin MRN: [id number] Date of Birth: 03-24-1946 Referring Provider:     Pulmonary Rehab from 05/30/2017 in [facility]  Referring Provider  Ancil Linsey MD      Initial Encounter Date:    Pulmonary Rehab from 05/30/2017 in [facility] Re[facility]  Date  05/30/17  Referring Provider  Ancil Linsey MD      Visit Diagnosis: Chronic obstructive pulmonary disease, unspecified COPD type ([place])  Patient's Home Medications on Admission:  Current Outpatient Prescriptions:  .  aspirin 325 MG EC tablet, Take 1 tablet (325 mg total) by mouth daily., Disp: 30 tablet, Rfl: 0 .  atorvastatin (LIPITOR) 40 MG tablet, Take 1 tablet (40 mg total) by mouth daily., Disp: 90 tablet, Rfl: 3 .  bisacodyl (DULCOLAX) 5 MG EC tablet, Take 2 tablets (10 mg total) by mouth daily as needed for moderate constipation (Give daily if no BM)., Disp: 30 tablet, Rfl: 0 .  budesonide (PULMICORT) 0.5 MG/2ML nebulizer solution, Take 0.5 mg by nebulization daily., Disp: , Rfl:  .  ferrous sulfate 325 (65 FE) MG tablet, Take 325 mg by mouth daily with breakfast., Disp: , Rfl:  .  furosemide (LASIX) 20 MG tablet, Take 1-2 tablets (20-40 mg total) by mouth as directed. Once daily with additional dose if he gains 2 pounds in a day or 5 pounds in a week., Disp: 60 tablet, Rfl: 3 .  ipratropium-albuterol (DUONEB) 0.5-2.5 (3) MG/3ML SOLN, Take 3 mLs by nebulization every 6 (six) hours as needed. When awake , Disp: , Rfl:  .  metoprolol tartrate (LOPRESSOR) 25 MG tablet, Take 12.5 mg by mouth 2 (two) times daily., Disp: , Rfl:  .  OXYGEN, Inhale into the lungs. By nasal cannula and wean as tolerated to keep O2 sats >90%, Disp: , Rfl:  .  potassium chloride (K-DUR) 10 MEQ tablet, Take 10 mEq by mouth daily., Disp: , Rfl:  .  tamsulosin (FLOMAX) 0.4 MG CAPS capsule, Take 1 capsule (0.4 mg total) by mouth daily., Disp: 30 capsule, Rfl:  1  Past Medical History: Past Medical History:  Diagnosis Date  . Bleb, lung ([place])   . Coronary artery disease 02/14/2017   NSTEMI with urgent CABG (LIMA-LAD and SVG->ramus)  . Hyperlipidemia   . Ischemic cardiomyopathy   . Patient denies medical problems   . Pneumothorax 05/2017   Left    Tobacco Use: History  Smoking Status  . Former Smoker  . Quit date: 04/06/2007  Smokeless Tobacco  . Never Used    Labs: Recent Review Flowsheet Data    Labs for ITP Cardiac and Pulmonary Rehab Latest Ref Rng & Units 02/15/2017 02/15/2017 02/15/2017 02/18/2017 02/18/2017   Cholestrol 0 - 200 mg/dL - - - - -   LDLCALC 0 - 99 mg/dL - - - - -   HDL >40 mg/dL - - - - -   Trlycerides <150 mg/dL - - - - -   Hemoglobin A1c 4.8 - 5.6 % - - - - -   PHART 7.350 - 7.450 7.316(L) 7.344(L) - 7.393 -   PCO2ART 32.0 - 48.0 mmHg 46.8 42.7 - 50.1(H) -   HCO3 20.0 - 28.0 mmol/L 23.7 23.3 - 30.5(H) -   TCO2 0 - 100 mmol/L '25 25 30 ' 32 30   ACIDBASEDEF 0.0 - 2.0 mmol/L 2.0 2.0 - - -   O2SAT % 96.0 96.0 - 98.0 -  Pulmonary Assessment Scores:     Pulmonary Assessment Scores    Row Name 05/30/17 1128         ADL UCSD   ADL Phase Entry     SOB Score total 38     Rest 0     Walk 1     Stairs 4     Bath 1     Dress 1     Shop 2       CAT Score   CAT Score 22       mMRC Score   mMRC Score 1        Pulmonary Function Assessment:     Pulmonary Function Assessment - 05/30/17 1142      Pulmonary Function Tests   FVC% 108 %   FEV1% 39 %   FEV1/FVC Ratio 28   RV% 153 %   DLCO% 59 %     Breath   Bilateral Breath Sounds Clear   Shortness of Breath Yes;Limiting activity      Exercise Target Goals:    Exercise Program Goal: Individual exercise prescription set with THRR, safety & activity barriers. Participant demonstrates ability to understand and report RPE using BORG scale, to self-measure pulse accurately, and to acknowledge the importance of the exercise  prescription.  Exercise Prescription Goal: Starting with aerobic activity 30 plus minutes a day, 3 days per week for initial exercise prescription. Provide home exercise prescription and guidelines that participant acknowledges understanding prior to discharge.  Activity Barriers & Risk Stratification:     Activity Barriers & Cardiac Risk Stratification - 05/30/17 1247      Activity Barriers & Cardiac Risk Stratification   Activity Barriers Muscular Weakness;Shortness of Breath      6 Minute Walk:     6 Minute Walk    Row Name 05/24/17 0835 05/24/17 0839 05/30/17 1243     6 Minute Walk   Phase Discharge  - Initial   Distance 1370 feet  - 340 feet   Distance % Change 69.1 %  560 ft  -  -   Walk Time 6 minutes  - 1.75 minutes   # of Rest Breaks 1  17 sec  - 2  2:11 and 2:06   MPH 2.69  - 2.25   METS 3.32  - 1.42   RPE 13  - 15   Perceived Dyspnea  3  - 4   VO2 Peak 11.61  - 4.98   Symptoms Yes (comment)  - Yes (comment)   Comments SOB, started on Room Air but changed to 2L at 4 min after saturations had dropped to 86%.  - SOB, fatigue, tight breathing, felt like coming down with cold.  Oxygen saturations dropped once seated both time.   Resting HR 95 bpm  - 88 bpm   Resting BP 124/60  - 134/70   Max Ex. HR 110 bpm  - 99 bpm   Max Ex. BP 154/70  - 150/68   2 Minute Post BP  -  - 126/64     Interval HR   Baseline HR (retired)  -  - 88   1 Minute HR  -  - 95   2 Minute HR  -  - 86   3 Minute HR  -  - 89   4 Minute HR  -  - 99   5 Minute HR  -  - 87   6 Minute HR  -  - 86   2  Minute Post HR  -  - 88   Interval Heart Rate?  -  - Yes     Interval Oxygen   Interval Oxygen?  -  - Yes   Baseline Oxygen Saturation %  - 98 % 91 %   Resting Liters of Oxygen  - 0 L  Room Air 2 L   1 Minute Oxygen Saturation %  -  - 93 %   1 Minute Liters of Oxygen  -  - 2 L   2 Minute Oxygen Saturation %  -  - 84 %  seated rest from 0:48-2:59   2 Minute Liters of Oxygen  -  - 2 L   3  Minute Oxygen Saturation %  -  - 91 %   3 Minute Liters of Oxygen  -  - 2 L   4 Minute Oxygen Saturation %  - 86 % 92 %  seated stopped at 3:54   4 Minute Liters of Oxygen  - 0 L 2 L   5 Minute Oxygen Saturation %  -  - 85 %   5 Minute Liters of Oxygen  -  - 2 L   6 Minute Oxygen Saturation %  - 92 % 91 %   6 Minute Liters of Oxygen  - 2 L 2 L   2 Minute Post Oxygen Saturation %  -  - 95 %   2 Minute Post Liters of Oxygen  -  - 2 L     Oxygen Initial Assessment:     Oxygen Initial Assessment - 05/30/17 1139      Home Oxygen   Home Oxygen Device Home Concentrator;E-Tanks   Sleep Oxygen Prescription None   Home Exercise Oxygen Prescription Continuous   Liters per minute 2   Home at Rest Exercise Oxygen Prescription None  unless short of breath   Compliance with Home Oxygen Use Yes     Program Oxygen Prescription   Program Oxygen Prescription Continuous   Liters per minute 2     Intervention   Short Term Goals To learn and exhibit compliance with exercise, home and travel O2 prescription;To learn and understand importance of monitoring SPO2 with pulse oximeter and demonstrate accurate use of the pulse oximeter.;To Learn and understand importance of maintaining oxygen saturations>88%;To learn and demonstrate proper purse lipped breathing techniques or other breathing techniques.;To learn and demonstrate proper use of respiratory medications   Long  Term Goals Exhibits compliance with exercise, home and travel O2 prescription;Maintenance of O2 saturations>88%;Compliance with respiratory medication;Demonstrates proper use of MDI's;Exhibits proper breathing techniques, such as purse lipped breathing or other method taught during program session;Verbalizes importance of monitoring SPO2 with pulse oximeter and return demonstration      Oxygen Re-Evaluation:     Oxygen Re-Evaluation    Row Name 04/21/17 0841 05/17/17 0901           Program Oxygen Prescription   Program Oxygen  Prescription Continuous Continuous  on treadmill mainly, trying to come off for seated equipment      Liters per minute  - 2      Comments Purl arrives on 2 liters of oxgyen that he reports he has had since surgery this year. Jennifer said he didn't use his oxgyen to sleep last night. He reported that he shut off his oxgyen for a few minutes while riding the bicycle in Cardiac Rehab today.  -        Home Oxygen   Home Oxygen Device  -  Home Concentrator;E-Tanks      Sleep Oxygen Prescription  - None      Home Exercise Oxygen Prescription Continuous Continuous  only with activity      Liters per minute 2 2      Home at Rest Exercise Oxygen Prescription  - None      Compliance with Home Oxygen Use Yes Yes        Goals/Expected Outcomes   Short Term Goals To learn and exhibit compliance with exercise, home and travel O2 prescription;To learn and understand importance of monitoring SPO2 with pulse oximeter and demonstrate accurate use of the pulse oximeter. To learn and exhibit compliance with exercise, home and travel O2 prescription;To learn and understand importance of monitoring SPO2 with pulse oximeter and demonstrate accurate use of the pulse oximeter.;To Learn and understand importance of maintaining oxygen saturations>88%      Long  Term Goals  - Exhibits compliance with exercise, home and travel O2 prescription;Verbalizes importance of monitoring SPO2 with pulse oximeter and return demonstration;Maintenance of O2 saturations>88%;Exhibits proper breathing techniques, such as purse lipped breathing or other method taught during program session      Comments  - Karan is trying to use less oxygen for exercise. He really wants to come off his oxygen altogether.  He is no longer using it at rest and just for exertion, but it is starting to get better.  His oxygen saturations have been good on room air.       Goals/Expected Outcomes  - Short: Start to exercise more without his oxygen and reduce amount  used on treadmill.  Long: Come off oxygen completely.         Oxygen Discharge (Final Oxygen Re-Evaluation):     Oxygen Re-Evaluation - 05/17/17 0901      Program Oxygen Prescription   Program Oxygen Prescription Continuous  on treadmill mainly, trying to come off for seated equipment   Liters per minute 2     Home Oxygen   Home Oxygen Device Home Concentrator;E-Tanks   Sleep Oxygen Prescription None   Home Exercise Oxygen Prescription Continuous  only with activity   Liters per minute 2   Home at Rest Exercise Oxygen Prescription None   Compliance with Home Oxygen Use Yes     Goals/Expected Outcomes   Short Term Goals To learn and exhibit compliance with exercise, home and travel O2 prescription;To learn and understand importance of monitoring SPO2 with pulse oximeter and demonstrate accurate use of the pulse oximeter.;To Learn and understand importance of maintaining oxygen saturations>88%   Long  Term Goals Exhibits compliance with exercise, home and travel O2 prescription;Verbalizes importance of monitoring SPO2 with pulse oximeter and return demonstration;Maintenance of O2 saturations>88%;Exhibits proper breathing techniques, such as purse lipped breathing or other method taught during program session   Comments Romell is trying to use less oxygen for exercise. He really wants to come off his oxygen altogether.  He is no longer using it at rest and just for exertion, but it is starting to get better.  His oxygen saturations have been good on room air.    Goals/Expected Outcomes Short: Start to exercise more without his oxygen and reduce amount used on treadmill.  Long: Come off oxygen completely.      Initial Exercise Prescription:     Initial Exercise Prescription - 05/30/17 1200      Date of Initial Exercise RX and Referring Provider   Date 05/30/17   Referring Provider Ancil Linsey MD  Oxygen   Oxygen Continuous  Primarily on treadmill   Liters 2      Treadmill   MPH 2   Grade 1   Minutes 15   METs 2.81     NuStep   Level 4   SPM 80   Minutes 15   METs 2.8     Arm Ergometer   Level 3   RPM 35   Minutes 15   METs 2.8     Resistance Training   Training Prescription Yes   Weight 3 lb   Reps 10-15      Perform Capillary Blood Glucose checks as needed.  Exercise Prescription Changes:     Exercise Prescription Changes    Row Name 05/02/17 1500 05/18/17 1100 05/30/17 1200         Response to Exercise   Blood Pressure (Admit) 130/70 110/58 134/70     Blood Pressure (Exercise) 136/72 132/60 150/68     Blood Pressure (Exit) 122/70 126/70 126/64     Heart Rate (Admit) 80 bpm 96 bpm 88 bpm     Heart Rate (Exercise) 115 bpm 105 bpm 99 bpm     Heart Rate (Exit) 103 bpm 77 bpm 88 bpm     Rating of Perceived Exertion (Exercise) '13 12 15     ' Perceived Dyspnea (Exercise)  -  - 4     Symptoms none none SOB     Comments  -  - walk test results     Duration Continue with 45 min of aerobic exercise without signs/symptoms of physical distress. Continue with 45 min of aerobic exercise without signs/symptoms of physical distress.  -     Intensity THRR unchanged THRR unchanged  -       Progression   Progression Continue to progress workloads to maintain intensity without signs/symptoms of physical distress. Continue to progress workloads to maintain intensity without signs/symptoms of physical distress.  -     Average METs 2.6 2.98  -       Resistance Training   Training Prescription Yes Yes  -     Weight 3 lb 3 lb  -     Reps 10-15 10-15  -       Interval Training   Interval Training No No  -       Oxygen   Oxygen Continuous Continuous  only on treadmill  -     Liters 2 2  -       Treadmill   MPH  - 2  -     Grade  - 1  -     Minutes  - 15  -     METs  - 2.81  -       Recumbant Bike   Level 4 4  -     Watts 13 27  -     Minutes 15 15  -     METs 2.59 3.35  -       NuStep   Level 4 4  -     Minutes 15 15  -      METs 2.6 2.8  -       Home Exercise Plan   Plans to continue exercise at  - Home (comment)  walking  -     Frequency  - Add 2 additional days to program exercise sessions.  -     Initial Home Exercises Provided  - 04/12/17  -  Exercise Comments:     Exercise Comments    Row Name 05/29/17 0758 06/09/17 1103         Exercise Comments  Clarnce graduated today from cardiac rehab with 36 sessions completed.  Details of the patient's exercise prescription and what He needs to do in order to continue the prescription and progress were discussed with patient.  Patient was given a copy of prescription and goals.  Patient verbalized understanding.  Kyheem plans to continue to exercise by walking at home. First full day of exercise!  Patient was oriented to gym and equipment including functions, settings, policies, and procedures.  Patient's individual exercise prescription and treatment plan were reviewed.  All starting workloads were established based on the results of the 6 minute walk test done at initial orientation visit.  The plan for exercise progression was also introduced and progression will be customized based on patient's performance and goals.         Exercise Goals and Review:     Exercise Goals    Row Name 05/30/17 1251             Exercise Goals   Increase Physical Activity Yes       Intervention Provide advice, education, support and counseling about physical activity/exercise needs.;Develop an individualized exercise prescription for aerobic and resistive training based on initial evaluation findings, risk stratification, comorbidities and participant's personal goals.       Expected Outcomes Achievement of increased cardiorespiratory fitness and enhanced flexibility, muscular endurance and strength shown through measurements of functional capacity and personal statement of participant.       Increase Strength and Stamina Yes       Intervention Provide advice,  education, support and counseling about physical activity/exercise needs.;Develop an individualized exercise prescription for aerobic and resistive training based on initial evaluation findings, risk stratification, comorbidities and participant's personal goals.       Expected Outcomes Achievement of increased cardiorespiratory fitness and enhanced flexibility, muscular endurance and strength shown through measurements of functional capacity and personal statement of participant.          Exercise Goals Re-Evaluation :     Exercise Goals Re-Evaluation    Row Name 05/02/17 1549 05/17/17 0851 05/24/17 0836         Exercise Goal Re-Evaluation   Exercise Goals Review Increase Physical Activity;Increase Strenth and Stamina Increase Physical Activity;Increase Strenth and Stamina Increase Physical Activity;Increase Strenth and Stamina     Comments Danielle has increased his resistnace on the NuStep.  Staff will continue to monitor progression. Jaceon has been doing well in rehab.  He has more strength and stamina and is able to do more at home.  He has also come off his oxygen except for exercise.  Today, he tried the stepper on RA and mainated 88-90%.  The treadmill is still his hardest piece with the weight bearing. Rebekah improved his walk test by 560 ft!       Expected Outcomes Short - Continue regular attendance Long - Continue to improve overall functional fitness. Short: Continue to exercise at home and try to exercise off oxygen.  Long: Continue to exercise independently. Short: Graduate  Long: Continue to exercise indpendent        Discharge Exercise Prescription (Final Exercise Prescription Changes):     Exercise Prescription Changes - 05/30/17 1200      Response to Exercise   Blood Pressure (Admit) 134/70   Blood Pressure (Exercise) 150/68   Blood Pressure (Exit) 126/64  Heart Rate (Admit) 88 bpm   Heart Rate (Exercise) 99 bpm   Heart Rate (Exit) 88 bpm   Rating of Perceived Exertion  (Exercise) 15   Perceived Dyspnea (Exercise) 4   Symptoms SOB   Comments walk test results      Nutrition:  Target Goals: Understanding of nutrition guidelines, daily intake of sodium <1535m, cholesterol <2012m calories 30% from fat and 7% or less from saturated fats, daily to have 5 or more servings of fruits and vegetables.  Biometrics:     Pre Biometrics - 05/30/17 1251      Pre Biometrics   Height 5' 4.6" (1.641 m)   Weight 150 lb 12.8 oz (68.4 kg)   Waist Circumference 35.5 inches   Hip Circumference 37 inches   Waist to Hip Ratio 0.96 %   BMI (Calculated) 25.5         Post Biometrics - 05/29/17 0835       Post  Biometrics   Height '5\' 4"'  (1.626 m)   Weight 152 lb 8 oz (69.2 kg)   Waist Circumference 34 inches   Hip Circumference 36 inches   Waist to Hip Ratio 0.94 %   BMI (Calculated) 26.2   Single Leg Stand 30 seconds      Nutrition Therapy Plan and Nutrition Goals:     Nutrition Therapy & Goals - 05/30/17 1250      Nutrition Therapy   RD appointment defered Yes     Intervention Plan   Intervention Prescribe, educate and counsel regarding individualized specific dietary modifications aiming towards targeted core components such as weight, hypertension, lipid management, diabetes, heart failure and other comorbidities.;Nutrition handout(s) given to patient.   Expected Outcomes Short Term Goal: Understand basic principles of dietary content, such as calories, fat, sodium, cholesterol and nutrients.;Short Term Goal: A plan has been developed with personal nutrition goals set during dietitian appointment.;Long Term Goal: Adherence to prescribed nutrition plan.      Nutrition Discharge: Rate Your Plate Scores:     Nutrition Assessments - 05/22/17 0851      MEDFICTS Scores   Pre Score 6   Post Score 6   Score Difference 0      Nutrition Goals Re-Evaluation:     Nutrition Goals Re-Evaluation    Row Name 04/21/17 0842 05/17/17 0900            Goals   Nutrition Goal  - continue with current heart healthy eating pattern, use menus      Comment When I asked JoNathanuelow his appt went with the Cardiac REhab REgistered Dietician he said it was very helpful. Little reports that "he has a peFacilities managerrom his church that cooks his lunch and dinner for him".  JoHartleyas a peFacilities managerrom church to prepare his meals.  They have continued  to focus on heart healthy eating. He feels he is getting a balanced diet.       Expected Outcome Heart Healthy eating plus eating for his COPD Short: Continue to eat balanced diets.  Long: Continue to eat heart healthy diet.         Nutrition Goals Discharge (Final Nutrition Goals Re-Evaluation):     Nutrition Goals Re-Evaluation - 05/17/17 0900      Goals   Nutrition Goal continue with current heart healthy eating pattern, use menus   Comment JoDaunteas a peFacilities managerrom church to prepare his meals.  They have continued  to focus on heart healthy eating.  He feels he is getting a balanced diet.    Expected Outcome Short: Continue to eat balanced diets.  Long: Continue to eat heart healthy diet.      Psychosocial: Target Goals: Acknowledge presence or absence of significant depression and/or stress, maximize coping skills, provide positive support system. Participant is able to verbalize types and ability to use techniques and skills needed for reducing stress and depression.   Initial Review & Psychosocial Screening:     Initial Psych Review & Screening - 05/30/17 1139      Initial Review   Current issues with Current Sleep Concerns     Family Dynamics   Good Support System? Yes      Quality of Life Scores:     Quality of Life - 05/22/17 0851      Quality of Life Scores   Health/Function Pre 23.46 %   Health/Function Post 24.46 %   Health/Function % Change 4.26 %   Socioeconomic Pre 29 %   Socioeconomic Post 27.5 %   Socioeconomic % Change  -5.17 %   Psych/Spiritual Pre 30 %    Psych/Spiritual Post 28.29 %   Psych/Spiritual % Change -5.7 %   Family Pre 28.5 %   Family Post 28.5 %   Family % Change 0 %   GLOBAL Pre 26.58 %   GLOBAL Post 26.5 %   GLOBAL % Change -0.3 %      PHQ-9: Recent Review Flowsheet Data    Depression screen [facility] 2/9 05/30/2017 05/22/2017 03/30/2017   Decreased Interest 0 0 2   Down, Depressed, Hopeless 0 0 0   PHQ - 2 Score 0 0 2   Altered sleeping 1 0 3    Tired, decreased energy 0 0 3    Change in appetite 0 0 1    Feeling bad or failure about yourself  0 0 0   Trouble concentrating 0 0 0   Moving slowly or fidgety/restless 0 0 0   Suicidal thoughts 0 0 0   PHQ-9 Score 1 0 9   Difficult doing work/chores Not difficult at all Not difficult at all Not difficult at all     Interpretation of Total Score  Total Score Depression Severity:  1-4 = Minimal depression, 5-9 = Mild depression, 10-14 = Moderate depression, 15-19 = Moderately severe depression, 20-27 = Severe depression   Psychosocial Evaluation and Intervention:     Psychosocial Evaluation - 06/14/17 1055      Psychosocial Evaluation & Interventions   Comments Counselor met with Mr. Chauncey Cruel Varden) today for initial psychosocial evaluation in the pulmonary rehab program.  He recently completed the Cardiac rehab program and a lung collapsed; so he was referred to this program by his pulmonologist.  Jenny Reichmann has a strong support system with his son who lives locally.  He continues to have a good appetite and sleeps well.  Khairi denies a history of depression or anxiety and states he is typically in a positive mood.  He has minimal stress in his life other than his health.  He has goals to breathe better and maintain the progress he made while in the Cardiac Rehab program.  Staff will follow with Jenny Reichmann.    Expected Outcomes Bernell will exercise consistently to breathe better and maintain his stamina and strength.  Staff will follow with him throughout the course of this program.         Psychosocial Re-Evaluation:     Psychosocial Re-Evaluation    Row Name  04/21/17 0851             Psychosocial Re-Evaluation   Current issues with Current Sleep Concerns       Comments Samanyu reports that last night he didn't use his oxgyen and he did well. Tyce said that he has a Facilities manager from his church cook his lunch and dinner for him and he makes his own breakfast. Brixton's son has bought him to Cardiac REhab since he started.        Expected Outcomes [place] -heart healthy lifestyle.       Interventions Encouraged to attend Cardiac Rehabilitation for the exercise       Continue Psychosocial Services  Follow up required by staff          Psychosocial Discharge (Final Psychosocial Re-Evaluation):     Psychosocial Re-Evaluation - 04/21/17 0851      Psychosocial Re-Evaluation   Current issues with Current Sleep Concerns   Comments Daunte reports that last night he didn't use his oxgyen and he did well. Giulio said that he has a Facilities manager from his church cook his lunch and dinner for him and he makes his own breakfast. Napoleon's son has bought him to Cardiac REhab since he started.    Expected Outcomes Dom -heart healthy lifestyle.   Interventions Encouraged to attend Cardiac Rehabilitation for the exercise   Continue Psychosocial Services  Follow up required by staff      Education: Education Goals: Education classes will be provided on a weekly basis, covering required topics. Participant will state understanding/return demonstration of topics presented.  Learning Barriers/Preferences:   Education Topics: Initial Evaluation Education: - Verbal, written and demonstration of respiratory meds, RPE/PD scales, oximetry and breathing techniques. Instruction on use of nebulizers and MDIs: cleaning and proper use, rinsing mouth with steroid doses and importance of monitoring MDI activations.   Pulmonary Rehab from 06/14/2017 in [facility] Cardiac and Pulmonary Rehab  Date  05/30/17   Educator  [facility]  Instruction Review Code (retired)  2- meets goals/outcomes      General Nutrition Guidelines/Fats and Fiber: -Group instruction provided by verbal, written material, models and posters to present the general guidelines for heart healthy nutrition. Gives an explanation and review of dietary fats and fiber.   Pulmonary Rehab from 06/12/2017 in [facility] Cardiac and Pulmonary Rehab  Date  06/12/17  Educator  CR  Instruction Review Code (retired)  2- meets goals/outcomes      Controlling Sodium/Reading Food Labels: -Group verbal and written material supporting the discussion of sodium use in heart healthy nutrition. Review and explanation with models, verbal and written materials for utilization of the food label.   Cardiac Rehab from 05/29/2017 in [facility] Cardiac and Pulmonary Rehab  Date  04/24/17  Educator  CR  Instruction Review Code (retired)  2- meets goals/outcomes      Exercise Physiology & Risk Factors: - Group verbal and written instruction with models to review the exercise physiology of the cardiovascular system and associated critical values. Details cardiovascular disease risk factors and the goals associated with each risk factor.   Cardiac Rehab from 05/29/2017 in [facility] Cardiac and Pulmonary Rehab  Date  05/01/17  Educator  [facility]  Instruction Review Code (retired)  2- meets goals/outcomes      Aerobic Exercise & Resistance Training: - Gives group verbal and written discussion on the health impact of inactivity. On the components of aerobic and resistive training programs and the benefits of this training and how to safely progress  through these programs.   Cardiac Rehab from 05/29/2017 in [facility] Cardiac and Pulmonary Rehab  Date  05/03/17  Educator  SB  Instruction Review Code (retired)  2- Statistician, Balance, General Exercise Guidelines: - Provides group verbal and written instruction on the benefits of flexibility and balance training  programs. Provides general exercise guidelines with specific guidelines to those with heart or lung disease. Demonstration and skill practice provided.   Cardiac Rehab from 05/29/2017 in [facility] Cardiac and Pulmonary Rehab  Date  05/08/17  Educator  [facility]  Instruction Review Code (retired)  2- meets goals/outcomes      Stress Management: - Provides group verbal and written instruction about the health risks of elevated stress, cause of high stress, and healthy ways to reduce stress.   Cardiac Rehab from 05/29/2017 in [facility] Cardiac and Pulmonary Rehab  Date  05/17/17  Educator  [facility]  Instruction Review Code (retired)  2- meets goals/outcomes      Depression: - Provides group verbal and written instruction on the correlation between heart/lung disease and depressed mood, treatment options, and the stigmas associated with seeking treatment.   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   Pulmonary Rehab from 06/14/2017 in [facility] Cardiac and Pulmonary Rehab  Date  05/30/17  Educator  [facility]  Instruction Review Code (retired)  2- meets goals/outcomes      Infection Prevention: - Provides verbal and written material to individual with discussion of infection control including proper hand washing and proper equipment cleaning during exercise session.   Pulmonary Rehab from 06/14/2017 in [facility] Cardiac and Pulmonary Rehab  Date  05/30/17  Educator  [facility]  Instruction Review Code (retired)  2- meets [employer] Prevention: - Provides verbal and written material to individual with discussion of falls prevention and safety.   Pulmonary Rehab from 06/14/2017 in [facility] Cardiac and Pulmonary Rehab  Date  05/30/17  Educator  [place]  Instruction Review Code (retired)   2- meets goals/outcomes      Diabetes: - Individual verbal and written instruction to review signs/symptoms of diabetes, desired ranges of glucose level fasting, after meals and with exercise. Advice that pre and post exercise glucose checks will be done for 3 sessions at entry of program.   Chronic Lung Diseases: - Group verbal and written instruction to review new updates, new respiratory medications, new advancements in procedures and treatments. Provide informative websites and "800" numbers of self-education.   Lung Procedures: - Group verbal and written instruction to describe testing methods done to diagnose lung disease. Review the outcome of test results. Describe the treatment choices: Pulmonary Function Tests, ABGs and oximetry.   Energy Conservation: - Provide group verbal and written instruction for methods to conserve energy, plan and organize activities. Instruct on pacing techniques, use of adaptive equipment and posture/positioning to relieve shortness of breath.   Triggers: - Group verbal and written instruction to review types of environmental controls: home humidity, furnaces, filters, dust mite/pet prevention, HEPA vacuums. To discuss weather changes, air quality and the benefits of nasal washing.   Exacerbations: - Group verbal and written instruction to provide: warning signs, infection symptoms, calling MD promptly, preventive modes, and value of vaccinations. Review: effective airway  clearance, coughing and/or vibration techniques. Create an Sports administrator.   Oxygen: - Individual and group verbal and written instruction on oxygen therapy. Includes supplement oxygen, available portable oxygen systems, continuous and intermittent flow rates, oxygen safety, concentrators, and Medicare reimbursement for oxygen.   Respiratory Medications: - Group verbal and written instruction to review medications for lung disease. Drug class, frequency, complications, importance of  spacers, rinsing mouth after steroid MDI's, and proper cleaning methods for nebulizers.   AED/CPR: - Group verbal and written instruction with the use of models to demonstrate the basic use of the AED with the basic ABC's of resuscitation.   Breathing Retraining: - Provides individuals verbal and written instruction on purpose, frequency, and proper technique of diaphragmatic breathing and pursed-lipped breathing. Applies individual practice skills.   Pulmonary Rehab from 06/14/2017 in [facility] Cardiac and Pulmonary Rehab  Date  05/30/17  Educator  [facility]  Instruction Review Code (retired)  2- Lawyer and Physiology of the Lungs: - Group verbal and written instruction with the use of models to provide basic lung anatomy and physiology related to function, structure and complications of lung disease.   Anatomy & Physiology of the Heart: - Group verbal and written instruction and models provide basic cardiac anatomy and physiology, with the coronary electrical and arterial systems. Review of: AMI, Angina, Valve disease, Heart Failure, Cardiac Arrhythmia, Pacemakers, and the ICD.   Cardiac Rehab from 05/29/2017 in [facility] Cardiac and Pulmonary Rehab  Date  05/15/17  Educator  CE  Instruction Review Code (retired)  2- meets goals/outcomes      Heart Failure: - Group verbal and written instruction on the basics of heart failure: signs/symptoms, treatments, explanation of ejection fraction, enlarged heart and cardiomyopathy.   Sleep Apnea: - Individual verbal and written instruction to review Obstructive Sleep Apnea. Review of risk factors, methods for diagnosing and types of masks and machines for OSA.   Anxiety: - Provides group, verbal and written instruction on the correlation between heart/lung disease and anxiety, treatment options, and management of anxiety.   Relaxation: - Provides group, verbal and written instruction about the benefits of relaxation for  patients with heart/lung disease. Also provides patients with examples of relaxation techniques.   Cardiac Medications: - Group verbal and written instruction to review commonly prescribed medications for heart disease. Reviews the medication, class of the drug, and side effects.   Cardiac Rehab from 05/29/2017 in [facility] Cardiac and Pulmonary Rehab  Date  05/29/17 [05/29/17 Part 1]  Educator  CE  Instruction Review Code (retired)  2- meets goals/outcomes      Know Your Numbers: -Group verbal and written instruction about important numbers in your health.  Review of Cholesterol, Blood Pressure, Diabetes, and BMI and the role they play in your overall health.   Other: -Provides group and verbal instruction on various topics (see comments)    Knowledge Questionnaire Score:     Knowledge Questionnaire Score - 05/30/17 1134      Knowledge Questionnaire Score   Pre Score 9/10       Core Components/Risk Factors/Patient Goals at Admission:     Personal Goals and Risk Factors at Admission - 05/30/17 1126      Core Components/Risk Factors/Patient Goals on Admission    Weight Management Yes   Intervention Weight Management: Develop a combined nutrition and exercise program designed to reach desired caloric intake, while maintaining appropriate intake of nutrient and fiber, sodium and fats, and appropriate energy expenditure required  for the weight goal.;Weight Management: Provide education and appropriate resources to help participant work on and attain dietary goals.;Weight Management/Obesity: Establish reasonable short term and long term weight goals.   Admit Weight 150 lb (68 kg)   Goal Weight: Short Term 150 lb (68 kg)   Goal Weight: Long Term 150 lb (68 kg)  wants to maintain his current weight   Expected Outcomes Short Term: Continue to assess and modify interventions until short term weight is achieved;Long Term: Adherence to nutrition and physical activity/exercise program aimed  toward attainment of established weight goal;Weight Maintenance: Understanding of the daily nutrition guidelines, which includes 25-35% calories from fat, 7% or less cal from saturated fats, less than 237m cholesterol, less than 1.5gm of sodium, & 5 or more servings of fruits and vegetables daily;Understanding recommendations for meals to include 15-35% energy as protein, 25-35% energy from fat, 35-60% energy from carbohydrates, less than 2086mof dietary cholesterol, 20-35 gm of total fiber daily;Understanding of distribution of calorie intake throughout the day with the consumption of 4-5 meals/snacks   Improve shortness of breath with ADL's Yes   Intervention Provide education, individualized exercise plan and daily activity instruction to help decrease symptoms of SOB with activities of daily living.   Expected Outcomes Short Term: Achieves a reduction of symptoms when performing activities of daily living.   Develop more efficient breathing techniques such as purse lipped breathing and diaphragmatic breathing; and practicing self-pacing with activity Yes   Intervention Provide education, demonstration and support about specific breathing techniuqes utilized for more efficient breathing. Include techniques such as pursed lipped breathing, diaphragmatic breathing and self-pacing activity.   Expected Outcomes Short Term: Participant will be able to demonstrate and use breathing techniques as needed throughout daily activities.   Increase knowledge of respiratory medications and ability to use respiratory devices properly  Yes   Intervention Provide education and demonstration as needed of appropriate use of medications, inhalers, and oxygen therapy.   Expected Outcomes Short Term: Achieves understanding of medications use. Understands that oxygen is a medication prescribed by physician. Demonstrates appropriate use of inhaler and oxygen therapy.   Heart Failure Yes   Intervention Provide a combined  exercise and nutrition program that is supplemented with education, support and counseling about heart failure. Directed toward relieving symptoms such as shortness of breath, decreased exercise tolerance, and extremity edema.   Expected Outcomes Improve functional capacity of life;Short term: Attendance in program 2-3 days a week with increased exercise capacity. Reported lower sodium intake. Reported increased fruit and vegetable intake. Reports medication compliance.;Short term: Daily weights obtained and reported for increase. Utilizing diuretic protocols set by physician.;Long term: Adoption of self-care skills and reduction of barriers for early signs and symptoms recognition and intervention leading to self-care maintenance.   Lipids Yes   Intervention Provide education and support for participant on nutrition & aerobic/resistive exercise along with prescribed medications to achieve LDL <7058mHDL >43m6m Expected Outcomes Short Term: Participant states understanding of desired cholesterol values and is compliant with medications prescribed. Participant is following exercise prescription and nutrition guidelines.;Long Term: Cholesterol controlled with medications as prescribed, with individualized exercise RX and with personalized nutrition plan. Value goals: LDL < 70mg55mL > 40 mg.   Personal Goal Other Yes   Personal Goal Increase upper body strength   Expected Outcomes Short: start pulmonary rehab. Long: To have increased upper body strength to increase his ADL      Core Components/Risk Factors/Patient Goals Review:  Goals and Risk Factor Review    Row Name 04/21/17 0849 05/17/17 0856           Core Components/Risk Factors/Patient Goals Review   Personal Goals Review Lipids Lipids;Weight Management/Obesity;Improve shortness of breath with ADL's      Review Reise reports that his "personal chef has all the heart healthy rules and diets and he cooks all my lunches and dinners for  me". Kanden reports that he makes his own breakfast and eats healty. Hoke gets his lipids checked by his MD.  Elmus has not had any problems with his medications.  He is able to do more at home now without getting as short of breath.  He is trying to come off of his oxygen some.  His weight has been steady.      Expected Outcomes Heart healthy living will using 2 liters of oxgyen currently since his surgeyr. Short: Continue to work on improving his SOB with activities.  Long: Continue to work on risk factor modifications.         Core Components/Risk Factors/Patient Goals at Discharge (Final Review):      Goals and Risk Factor Review - 05/17/17 0856      Core Components/Risk Factors/Patient Goals Review   Personal Goals Review Lipids;Weight Management/Obesity;Improve shortness of breath with ADL's   Review Colman has not had any problems with his medications.  He is able to do more at home now without getting as short of breath.  He is trying to come off of his oxygen some.  His weight has been steady.   Expected Outcomes Short: Continue to work on improving his SOB with activities.  Long: Continue to work on risk factor modifications.      ITP Comments:     ITP Comments    Row Name 04/21/17 [phone] 05/10/17 3736 05/30/17 1244 06/19/17 0825     ITP Comments Roby arrives on 2 liters of oxgyen that he reports he has had since surgery this year. Prajwal said he didn't use his oxgyen to sleep last night. He reported that he shut off his oxgyen for a few minutes while riding the bicycle in Cardiac Rehab today.  30 day review. Continue with ITP unless directed changes per Medical Director review    Met with Dr. Sabra Heck director of [place] for face to face. Chart reviewed and signed. Initial ITP sent. Diagnosis can be found in [facility] encounter 05/30/2017 30 day review completed. ITP sent to Dr. Emily Filbert Director of [place]. Continue with ITP unless changes are made by physician.         Comments: 30 day  review

## 2017-06-21 DIAGNOSIS — J449 Chronic obstructive pulmonary disease, unspecified: Secondary | ICD-10-CM

## 2017-06-21 DIAGNOSIS — I214 Non-ST elevation (NSTEMI) myocardial infarction: Secondary | ICD-10-CM | POA: Diagnosis not present

## 2017-06-21 DIAGNOSIS — Z951 Presence of aortocoronary bypass graft: Secondary | ICD-10-CM

## 2017-06-21 NOTE — Progress Notes (Signed)
Daily Session Note  Patient Details  Name: Thomas Trujillo MRN: [id number] Date of Birth: 08/04/1946 Referring Provider:     Pulmonary Rehab from 05/30/2017 in [facility] Cardiac and Pulmonary Rehab  Referring Provider  Fleming, [place] MD      Encounter Date: 06/21/2017  Check In:     Session Check In - 06/21/17 1021      Check-In   Location [facility]-Cardiac & Pulmonary Rehab   Staff Present Jessica Hawkins, MA, ACSM RCEP, Exercise Physiologist;Amanda Sommer, BA, ACSM CEP, Exercise Physiologist;Joseph Hood RCP,RRT,BSRT   Supervising physician immediately available to respond to emergencies [facility] immediately available ER MD   Physician(s) Williams and Norman   Medication changes reported     No   Fall or balance concerns reported    No   Warm-up and Cool-down Performed as group-led instruction   Resistance Training Performed Yes   VAD Patient? No     Pain Assessment   Currently in Pain? No/denies         History  Smoking Status  . Former Smoker  . Quit date: 04/06/2007  Smokeless Tobacco  . Never Used    Goals Met:  Proper associated with RPD/PD & O2 Sat Independence with exercise equipment Exercise tolerated well Strength training completed today  Goals Unmet:  Not Applicable  Comments: Reviewed home exercise with pt today.  Pt plans to walk for exercise.  Reviewed THR, pulse, RPE, sign and symptoms, NTG use, and when to call 911 or MD.  Also discussed weather considerations and indoor options.  Pt voiced understanding.    Dr. Mark Miller is Medical Director for [facility] and [facility]. 

## 2017-06-22 NOTE — Progress Notes (Signed)
  Patient ID: Azel Gumina, male   DOB: 15-Jul-1946, 71 y.o.   MRN: [id number]  HISTORY: He returns today in follow-up. He has no specific complaints.   Vitals:   06/09/17 0807  BP: 115/70  Pulse: 82  Resp: (!) 22  Temp: 98.2 F (36.8 C)  SpO2: 96%     EXAM:    Resp: Lungs are clear bilaterally.  No respiratory distress, normal effort. Heart:  Regular without murmurs Abd:  Abdomen is soft, non distended and non tender. No masses are palpable.  There is no rebound and no guarding.  Neurological: Alert and oriented to person, place, and time. Coordination normal.  Skin: Skin is warm and dry. No rash noted. No diaphoretic. No erythema. No pallor.  Psychiatric: Normal mood and affect. Normal behavior. Judgment and thought content normal.    ASSESSMENT: Independent review the chest x-ray reveals no pneumothorax.   PLAN:   He will follow-up with [place] as needed. No additional follow-up was made for him at this time.    Nestor Lewandowsky, MD

## 2017-06-23 DIAGNOSIS — J449 Chronic obstructive pulmonary disease, unspecified: Secondary | ICD-10-CM

## 2017-06-23 DIAGNOSIS — I214 Non-ST elevation (NSTEMI) myocardial infarction: Secondary | ICD-10-CM | POA: Diagnosis not present

## 2017-06-23 NOTE — Progress Notes (Signed)
Daily Session Note  Patient Details  Name: Thomas Trujillo MRN: [id number] Date of Birth: July 29, 1946 Referring Provider:     Pulmonary Rehab from 05/30/2017 in [facility] Cardiac and Pulmonary Rehab  Referring Provider  Ancil Linsey MD      Encounter Date: 06/23/2017  Check In:     Session Check In - 06/23/17 1013      Check-In   Location [facility]-Cardiac & Pulmonary Rehab   Staff Present Gerlene Burdock, RN, BSN;Joseph Hood RCP,RRT,BSRT;Amanda Oletta Darter, [place], ACSM CEP, Exercise Physiologist   Supervising physician immediately available to respond to emergencies [facility] immediately available ER MD   Physician(s) Dr. Burlene Arnt and Alfred Levins   Medication changes reported     No   Fall or balance concerns reported    No   Warm-up and Cool-down Performed as group-led instruction   Resistance Training Performed Yes   VAD Patient? No     Pain Assessment   Currently in Pain? No/denies   Multiple Pain Sites No         History  Smoking Status  . Former Smoker  . Quit date: 04/06/2007  Smokeless Tobacco  . Never Used    Goals Met:  Proper associated with RPD/PD & O2 Sat Independence with exercise equipment Exercise tolerated well No report of cardiac concerns or symptoms Strength training completed today  Goals Unmet:  Not Applicable  Comments: Pt able to follow exercise prescription today without complaint.  Will continue to monitor for progression.   Dr. Emily Filbert is Medical Director for [place] and [facility].

## 2017-06-27 ENCOUNTER — Other Ambulatory Visit: Payer: Self-pay | Admitting: Internal Medicine

## 2017-06-28 ENCOUNTER — Encounter: Payer: Medicare Other | Attending: Specialist

## 2017-06-28 DIAGNOSIS — Z79899 Other long term (current) drug therapy: Secondary | ICD-10-CM | POA: Insufficient documentation

## 2017-06-28 DIAGNOSIS — Z951 Presence of aortocoronary bypass graft: Secondary | ICD-10-CM

## 2017-06-28 DIAGNOSIS — I214 Non-ST elevation (NSTEMI) myocardial infarction: Secondary | ICD-10-CM | POA: Insufficient documentation

## 2017-06-28 DIAGNOSIS — J449 Chronic obstructive pulmonary disease, unspecified: Secondary | ICD-10-CM

## 2017-06-28 NOTE — Progress Notes (Signed)
Daily Session Note  Patient Details  Name: Jahad Old MRN: [id number] Date of Birth: 1945-11-08 Referring Provider:     Pulmonary Rehab from 05/30/2017 in [facility] Cardiac and Pulmonary Rehab  Referring Provider  Ancil Linsey MD      Encounter Date: 06/28/2017  Check In:     Session Check In - 06/28/17 1106      Check-In   Location [facility]-Cardiac & Pulmonary Rehab   Staff Present Alberteen Sam, MA, ACSM RCEP, Exercise Physiologist;Amanda Oletta Darter, BA, ACSM CEP, Exercise Physiologist;Joseph Flavia Shipper   Supervising physician immediately available to respond to emergencies See telemetry face sheet for immediately available ER MD   Physician(s) Archie Balboa  and Oswaldo Done   Medication changes reported     No   Fall or balance concerns reported    No   Warm-up and Cool-down Performed on first and last piece of equipment   Resistance Training Performed Yes   VAD Patient? No     Pain Assessment   Currently in Pain? No/denies         History  Smoking Status  . Former Smoker  . Quit date: 04/06/2007  Smokeless Tobacco  . Never Used    Goals Met:  Proper associated with RPD/PD & O2 Sat Independence with exercise equipment Exercise tolerated well Strength training completed today  Goals Unmet:  Not Applicable  Comments: Pt able to follow exercise prescription today without complaint.  Will continue to monitor for progression.    Dr. Emily Filbert is Medical Director for [facility] and [facility].

## 2017-06-30 ENCOUNTER — Encounter: Payer: Self-pay | Admitting: *Deleted

## 2017-06-30 DIAGNOSIS — J449 Chronic obstructive pulmonary disease, unspecified: Secondary | ICD-10-CM

## 2017-07-03 ENCOUNTER — Encounter: Payer: Medicare Other | Admitting: *Deleted

## 2017-07-03 DIAGNOSIS — I214 Non-ST elevation (NSTEMI) myocardial infarction: Secondary | ICD-10-CM | POA: Diagnosis not present

## 2017-07-03 DIAGNOSIS — J449 Chronic obstructive pulmonary disease, unspecified: Secondary | ICD-10-CM

## 2017-07-03 NOTE — Progress Notes (Signed)
Daily Session Note  Patient Details  Name: Eduar Kumpf MRN: [id number] Date of Birth: 07-Jul-1946 Referring Provider:     Pulmonary Rehab from 05/30/2017 in [facility] Cardiac and Pulmonary Rehab  Referring Provider  Ancil Linsey MD      Encounter Date: 07/03/2017  Check In:     Session Check In - 07/03/17 1018      Check-In   Location [facility]-Cardiac & Pulmonary Rehab   Staff Present Earlean Shawl, BS, ACSM CEP, Exercise Physiologist;Joseph Foy [place], BA, ACSM CEP, Exercise Physiologist   Supervising physician immediately available to respond to emergencies [facility] immediately available ER MD   Physician(s) Dr. Corky Downs and Dr. Jimmye Norman   Medication changes reported     No   Fall or balance concerns reported    No   Warm-up and Cool-down Performed as group-led instruction   Resistance Training Performed Yes   VAD Patient? No     Pain Assessment   Currently in Pain? No/denies   Multiple Pain Sites No         History  Smoking Status  . Former Smoker  . Quit date: 04/06/2007  Smokeless Tobacco  . Never Used    Goals Met:  Proper associated with RPD/PD & O2 Sat Independence with exercise equipment Exercise tolerated well Strength training completed today  Goals Unmet:  Not Applicable  Comments: Pt able to follow exercise prescription today without complaint.  Will continue to monitor for progression.    Dr. Emily Filbert is Medical Director for [place] and [facility].

## 2017-07-05 DIAGNOSIS — J449 Chronic obstructive pulmonary disease, unspecified: Secondary | ICD-10-CM

## 2017-07-05 DIAGNOSIS — I214 Non-ST elevation (NSTEMI) myocardial infarction: Secondary | ICD-10-CM | POA: Diagnosis not present

## 2017-07-05 NOTE — Progress Notes (Signed)
Daily Session Note  Patient Details  Name: Thomas Trujillo MRN: [id number] Date of Birth: [birth year] Referring Provider:     Pulmonary Rehab from 05/30/2017 in [facility] Cardiac and Pulmonary Rehab  Referring Provider  Ancil Linsey MD      Encounter Date: 07/05/2017  Check In:     Session Check In - 07/05/17 1020      Check-In   Location [facility]-Cardiac & Pulmonary Rehab   Staff Present Alberteen Sam, MA, ACSM RCEP, Exercise Physiologist;Amanda Oletta Darter, BA, ACSM CEP, Exercise Physiologist;Avi Archuleta Flavia Shipper   Supervising physician immediately available to respond to emergencies [facility] immediately available ER MD   Physician(s) Dr. Alfred Levins and Jimmye Norman   Medication changes reported     No   Fall or balance concerns reported    No   Warm-up and Cool-down Performed as group-led instruction   Resistance Training Performed Yes   VAD Patient? No     Pain Assessment   Currently in Pain? No/denies   Multiple Pain Sites No         History  Smoking Status  . Former Smoker  . Quit date: 04/06/2007  Smokeless Tobacco  . Never Used    Goals Met:  Independence with exercise equipment Exercise tolerated well Personal goals reviewed No report of cardiac concerns or symptoms Strength training completed today  Goals Unmet:  Not Applicable  Comments: Pt able to follow exercise prescription today without complaint.  Will continue to monitor for progression.   Dr. Emily Filbert is Medical Director for [place] and [facility].

## 2017-07-07 ENCOUNTER — Encounter: Payer: Medicare Other | Admitting: *Deleted

## 2017-07-07 DIAGNOSIS — J449 Chronic obstructive pulmonary disease, unspecified: Secondary | ICD-10-CM

## 2017-07-07 DIAGNOSIS — I214 Non-ST elevation (NSTEMI) myocardial infarction: Secondary | ICD-10-CM | POA: Diagnosis not present

## 2017-07-07 NOTE — Progress Notes (Signed)
Daily Session Note  Patient Details  Name: Thomas Trujillo MRN: [id number] Date of Birth: 03/15/1946 Referring Provider:     Pulmonary Rehab from 05/30/2017 in [facility] Cardiac and Pulmonary Rehab  Referring Provider  Ancil Linsey MD      Encounter Date: 07/07/2017  Check In:     Session Check In - 07/07/17 1013      Check-In   Location [facility]-Cardiac & Pulmonary Rehab   Staff Present Renita Papa, RN Vickki Hearing, BA, ACSM CEP, Exercise Physiologist;Jessica Luan Pulling, MA, ACSM RCEP, Exercise Physiologist   Supervising physician immediately available to respond to emergencies [facility] immediately available ER MD   Physician(s) Dr. Clearnce Hasten and Jimmye Norman    Medication changes reported     No   Fall or balance concerns reported    No   Warm-up and Cool-down Performed as group-led instruction   Resistance Training Performed Yes   VAD Patient? No     Pain Assessment   Currently in Pain? No/denies         History  Smoking Status  . Former Smoker  . Quit date: 04/06/2007  Smokeless Tobacco  . Never Used    Goals Met:  Proper associated with RPD/PD & O2 Sat Independence with exercise equipment Using PLB without cueing & demonstrates good technique Exercise tolerated well Strength training completed today  Goals Unmet:  Not Applicable  Comments: Pt able to follow exercise prescription today without complaint.  Will continue to monitor for progression.    Dr. Emily Filbert is Medical Director for [place] and [facility].

## 2017-07-10 DIAGNOSIS — I214 Non-ST elevation (NSTEMI) myocardial infarction: Secondary | ICD-10-CM | POA: Diagnosis not present

## 2017-07-10 DIAGNOSIS — J449 Chronic obstructive pulmonary disease, unspecified: Secondary | ICD-10-CM

## 2017-07-10 NOTE — Progress Notes (Signed)
Daily Session Note  Patient Details  Name: Thomas Trujillo MRN: [id number] Date of Birth: 1945-12-17 Referring Provider:     Pulmonary Rehab from 05/30/2017 in [facility] Cardiac and Pulmonary Rehab  Referring Provider  Ancil Linsey MD      Encounter Date: 07/10/2017  Check In:     Session Check In - 07/10/17 1008      Check-In   Location [facility]-Cardiac & Pulmonary Rehab   Staff Present Alberteen Sam, MA, ACSM RCEP, Exercise Physiologist;Amanda Oletta Darter, BA, ACSM CEP, Exercise Physiologist;Kelly Amedeo Plenty, BS, ACSM CEP, Exercise Physiologist;Reathel Turi Flavia Shipper   Supervising physician immediately available to respond to emergencies [facility] immediately available ER MD   Physician(s) Dr. Corky Downs and Jimmye Norman   Medication changes reported     No   Fall or balance concerns reported    No   Warm-up and Cool-down Performed as group-led instruction   Resistance Training Performed Yes   VAD Patient? No     Pain Assessment   Currently in Pain? No/denies   Multiple Pain Sites No         History  Smoking Status  . Former Smoker  . Quit date: 04/06/2007  Smokeless Tobacco  . Never Used    Goals Met:  Independence with exercise equipment Exercise tolerated well No report of cardiac concerns or symptoms Strength training completed today  Goals Unmet:  Not Applicable  Comments: Pt able to follow exercise prescription today without complaint.  Will continue to monitor for progression.   Dr. Emily Filbert is Medical Director for [place] and [facility].

## 2017-07-12 DIAGNOSIS — Z951 Presence of aortocoronary bypass graft: Secondary | ICD-10-CM

## 2017-07-12 DIAGNOSIS — J449 Chronic obstructive pulmonary disease, unspecified: Secondary | ICD-10-CM

## 2017-07-12 DIAGNOSIS — I214 Non-ST elevation (NSTEMI) myocardial infarction: Secondary | ICD-10-CM | POA: Diagnosis not present

## 2017-07-12 NOTE — Progress Notes (Signed)
Daily Session Note  Patient Details  Name: Frazer [place] MRN: [id number] Date of Birth: [birth year] Referring Provider:     Pulmonary Rehab from 05/30/2017 in [facility] Cardiac and Pulmonary Rehab  Referring Provider  Ancil Linsey MD      Encounter Date: 07/12/2017  Check In:     Session Check In - 07/12/17 1039      Check-In   Location [facility]-Cardiac & Pulmonary Rehab   Staff Present Alberteen Sam, MA, ACSM RCEP, Exercise Physiologist;Amanda Oletta Darter, BA, ACSM CEP, Exercise Physiologist;Joseph Flavia Shipper   Supervising physician immediately available to respond to emergencies [facility] immediately available ER MD   Physician(s) Jimmye Norman and Joni Fears   Medication changes reported     No   Fall or balance concerns reported    No   Warm-up and Cool-down Performed as group-led Location manager Performed Yes   VAD Patient? No     Pain Assessment   Currently in Pain? No/denies         History  Smoking Status  . Former Smoker  . Quit date: 04/06/2007  Smokeless Tobacco  . Never Used    Goals Met:  Proper associated with RPD/PD & O2 Sat Independence with exercise equipment Exercise tolerated well Strength training completed today  Goals Unmet:  Not Applicable  Comments: Pt able to follow exercise prescription today without complaint.  Will continue to monitor for progression.    Dr. Emily Filbert is Medical Director for [place] and [facility].

## 2017-07-14 DIAGNOSIS — J449 Chronic obstructive pulmonary disease, unspecified: Secondary | ICD-10-CM

## 2017-07-14 DIAGNOSIS — I214 Non-ST elevation (NSTEMI) myocardial infarction: Secondary | ICD-10-CM | POA: Diagnosis not present

## 2017-07-14 NOTE — Progress Notes (Signed)
Daily Session Note  Patient Details  Name: Thomas Trujillo MRN: [id number] Date of Birth: 09-18-1946 Referring Provider:     Pulmonary Rehab from 05/30/2017 in [facility] Cardiac and Pulmonary Rehab  Referring Provider  Ancil Linsey MD      Encounter Date: 07/14/2017  Check In:     Session Check In - 07/14/17 1013      Check-In   Location [facility]-Cardiac & Pulmonary Rehab   Staff Present Gerlene Burdock, RN, Vickki Hearing, BA, ACSM CEP, Exercise Physiologist;Onofrio Klemp Flavia Shipper   Supervising physician immediately available to respond to emergencies [facility] immediately available ER MD   Physician(s) Dr. Archie Balboa and Joni Fears   Medication changes reported     No   Fall or balance concerns reported    No   Warm-up and Cool-down Performed as group-led instruction   Resistance Training Performed Yes   VAD Patient? No     Pain Assessment   Currently in Pain? No/denies   Multiple Pain Sites No         History  Smoking Status  . Former Smoker  . Quit date: 04/06/2007  Smokeless Tobacco  . Never Used    Goals Met:  Independence with exercise equipment Exercise tolerated well No report of cardiac concerns or symptoms Strength training completed today  Goals Unmet:  Not Applicable  Comments: Pt able to follow exercise prescription today without complaint.  Will continue to monitor for progression.   Dr. Emily Filbert is Medical Director for [place] and [facility].

## 2017-07-17 DIAGNOSIS — J449 Chronic obstructive pulmonary disease, unspecified: Secondary | ICD-10-CM

## 2017-07-17 DIAGNOSIS — I214 Non-ST elevation (NSTEMI) myocardial infarction: Secondary | ICD-10-CM | POA: Diagnosis not present

## 2017-07-17 NOTE — Progress Notes (Signed)
Pulmonary Individual Treatment Plan  Patient Details  Name: Thomas Trujillo MRN: [id number] Date of Birth: Jun 09, 1946 Referring Provider:     Pulmonary Rehab from 05/30/2017 in [facility] Cardiac [facility] Pulmonary Rehab  Referring Provider  Ancil Linsey MD      Initial Encounter Date:    Pulmonary Rehab from 05/30/2017 in [facility] Cardiac and Pulmonary Rehab  Date  05/30/17  Referring Provider  Ancil Linsey MD      Visit Diagnosis: Chronic obstructive pulmonary disease, unspecified COPD type ([place])  Patient's Home Medications on Admission:  Current Outpatient Prescriptions:  .  aspirin 325 MG EC tablet, Take 1 tablet (325 mg total) by mouth daily., Disp: 30 tablet, Rfl: 0 .  atorvastatin (LIPITOR) 40 MG tablet, Take 1 tablet (40 mg total) by mouth daily., Disp: 90 tablet, Rfl: 3 .  bisacodyl (DULCOLAX) 5 MG EC tablet, Take 2 tablets (10 mg total) by mouth daily as needed for moderate constipation (Give daily if no BM)., Disp: 30 tablet, Rfl: 0 .  budesonide (PULMICORT) 0.5 MG/2ML nebulizer solution, Take 0.5 mg by nebulization daily., Disp: , Rfl:  .  ferrous sulfate 325 (65 FE) MG tablet, Take 325 mg by mouth daily with breakfast., Disp: , Rfl:  .  furosemide (LASIX) 20 MG tablet, TAKE 1-2 TABS AS DIRECTED ONCE DAILY WITH ADDITIONAL DOSE IF GAINS 2 POUNDS IN A DAY OR 5 IN A WEEK, Disp: 60 tablet, Rfl: 3 .  ipratropium-albuterol (DUONEB) 0.5-2.5 (3) MG/3ML SOLN, Take 3 mLs by nebulization every 6 (six) hours as needed. When awake , Disp: , Rfl:  .  metoprolol tartrate (LOPRESSOR) 25 MG tablet, Take 12.5 mg by mouth 2 (two) times daily., Disp: , Rfl:  .  OXYGEN, Inhale into the lungs. By nasal cannula and wean as tolerated to keep O2 sats >90%, Disp: , Rfl:  .  potassium chloride (K-DUR) 10 MEQ tablet, Take 10 mEq by mouth daily., Disp: , Rfl:  .  tamsulosin (FLOMAX) 0.4 MG CAPS capsule, Take 1 capsule (0.4 mg total) by mouth daily., Disp: 30 capsule, Rfl: 1  Past Medical History: Past Medical  History:  Diagnosis Date  . Bleb, lung ([place])   . Coronary artery disease 02/14/2017   NSTEMI with urgent CABG (LIMA-LAD and SVG->ramus)  . Hyperlipidemia   . Ischemic cardiomyopathy   . Patient denies medical problems   . Pneumothorax 05/2017   Left    Tobacco Use: History  Smoking Status  . Former Smoker  . Quit date: 04/06/2007  Smokeless Tobacco  . Never Used    Labs: Recent Review Flowsheet Data    Labs for ITP Cardiac and Pulmonary Rehab Latest Ref Rng & Units 02/15/2017 02/15/2017 02/15/2017 02/18/2017 02/18/2017   Cholestrol 0 - 200 mg/dL - - - - -   LDLCALC 0 - 99 mg/dL - - - - -   HDL >40 mg/dL - - - - -   Trlycerides <150 mg/dL - - - - -   Hemoglobin A1c 4.8 - 5.6 % - - - - -   PHART 7.350 - 7.450 7.316(L) 7.344(L) - 7.393 -   PCO2ART 32.0 - 48.0 mmHg 46.8 42.7 - 50.1(H) -   HCO3 20.0 - 28.0 mmol/L 23.7 23.3 - 30.5(H) -   TCO2 0 - 100 mmol/L '25 25 30 ' 32 30   ACIDBASEDEF 0.0 - 2.0 mmol/L 2.0 2.0 - - -   O2SAT % 96.0 96.0 - 98.0 -       Pulmonary Assessment Scores:  Pulmonary Assessment Scores    Row Name 05/30/17 1128         ADL UCSD   ADL Phase Entry     SOB Score total 38     Rest 0     Walk 1     Stairs 4     Bath 1     Dress 1     Shop 2       CAT Score   CAT Score 22       mMRC Score   mMRC Score 1        Pulmonary Function Assessment:     Pulmonary Function Assessment - 05/30/17 1142      Pulmonary Function Tests   FVC% 108 %   FEV1% 39 %   FEV1/FVC Ratio 28   RV% 153 %   DLCO% 59 %     Breath   Bilateral Breath Sounds Clear   Shortness of Breath Yes;Limiting activity      Exercise Target Goals:    Exercise Program Goal: Individual exercise prescription set with THRR, safety & activity barriers. Participant demonstrates ability to understand and report RPE using BORG scale, to self-measure pulse accurately, and to acknowledge the importance of the exercise prescription.  Exercise Prescription Goal: Starting with  aerobic activity 30 plus minutes a day, 3 days per week for initial exercise prescription. Provide home exercise prescription and guidelines that participant acknowledges understanding prior to discharge.  Activity Barriers & Risk Stratification:     Activity Barriers & Cardiac Risk Stratification - 05/30/17 1247      Activity Barriers & Cardiac Risk Stratification   Activity Barriers Muscular Weakness;Shortness of Breath      6 Minute Walk:     6 Minute Walk    Row Name 05/24/17 0835 05/24/17 0839 05/30/17 1243     6 Minute Walk   Phase Discharge  - Initial   Distance 1370 feet  - 340 feet   Distance % Change 69.1 %  560 ft  -  -   Walk Time 6 minutes  - 1.75 minutes   # of Rest Breaks 1  17 sec  - 2  2:11 and 2:06   MPH 2.69  - 2.25   METS 3.32  - 1.42   RPE 13  - 15   Perceived Dyspnea  3  - 4   VO2 Peak 11.61  - 4.98   Symptoms Yes (comment)  - Yes (comment)   Comments SOB, started on Room Air but changed to 2L at 4 min after saturations had dropped to 86%.  - SOB, fatigue, tight breathing, felt like coming down with cold.  Oxygen saturations dropped once seated both time.   Resting HR 95 bpm  - 88 bpm   Resting BP 124/60  - 134/70   Max Ex. HR 110 bpm  - 99 bpm   Max Ex. BP 154/70  - 150/68   2 Minute Post BP  -  - 126/64     Interval HR   Baseline HR (retired)  -  - 88   1 Minute HR  -  - 95   2 Minute HR  -  - 86   3 Minute HR  -  - 89   4 Minute HR  -  - 99   5 Minute HR  -  - 87   6 Minute HR  -  - 86   2 Minute Post HR  -  -  88   Interval Heart Rate?  -  - Yes     Interval Oxygen   Interval Oxygen?  -  - Yes   Baseline Oxygen Saturation %  - 98 % 91 %   Resting Liters of Oxygen  - 0 L  Room Air 2 L   1 Minute Oxygen Saturation %  -  - 93 %   1 Minute Liters of Oxygen  -  - 2 L   2 Minute Oxygen Saturation %  -  - 84 %  seated rest from 0:48-2:59   2 Minute Liters of Oxygen  -  - 2 L   3 Minute Oxygen Saturation %  -  - 91 %   3 Minute Liters of  Oxygen  -  - 2 L   4 Minute Oxygen Saturation %  - 86 % 92 %  seated stopped at 3:54   4 Minute Liters of Oxygen  - 0 L 2 L   5 Minute Oxygen Saturation %  -  - 85 %   5 Minute Liters of Oxygen  -  - 2 L   6 Minute Oxygen Saturation %  - 92 % 91 %   6 Minute Liters of Oxygen  - 2 L 2 L   2 Minute Post Oxygen Saturation %  -  - 95 %   2 Minute Post Liters of Oxygen  -  - 2 L     Oxygen Initial Assessment:     Oxygen Initial Assessment - 05/30/17 1139      Home Oxygen   Home Oxygen Device Home Concentrator;E-Tanks   Sleep Oxygen Prescription None   Home Exercise Oxygen Prescription Continuous   Liters per minute 2   Home at Rest Exercise Oxygen Prescription None  unless short of breath   Compliance with Home Oxygen Use Yes     Program Oxygen Prescription   Program Oxygen Prescription Continuous   Liters per minute 2     Intervention   Short Term Goals To learn and exhibit compliance with exercise, home and travel O2 prescription;To learn and understand importance of monitoring SPO2 with pulse oximeter and demonstrate accurate use of the pulse oximeter.;To Learn and understand importance of maintaining oxygen saturations>88%;To learn and demonstrate proper purse lipped breathing techniques or other breathing techniques.;To learn and demonstrate proper use of respiratory medications   Long  Term Goals Exhibits compliance with exercise, home and travel O2 prescription;Maintenance of O2 saturations>88%;Compliance with respiratory medication;Demonstrates proper use of MDI's;Exhibits proper breathing techniques, such as purse lipped breathing or other method taught during program session;Verbalizes importance of monitoring SPO2 with pulse oximeter and return demonstration      Oxygen Re-Evaluation:     Oxygen Re-Evaluation    Row Name 07/05/17 1034             Program Oxygen Prescription   Program Oxygen Prescription Continuous  o2 on treadmill and as needed       Liters  per minute 2         Home Oxygen   Home Oxygen Device Home Concentrator;E-Tanks       Sleep Oxygen Prescription None       Home Exercise Oxygen Prescription Continuous       Liters per minute 2       Home at Rest Exercise Oxygen Prescription None       Compliance with Home Oxygen Use Yes         Goals/Expected Outcomes  Short Term Goals To learn and exhibit compliance with exercise, home and travel O2 prescription;To learn and understand importance of monitoring SPO2 with pulse oximeter and demonstrate accurate use of the pulse oximeter.;To learn and understand importance of maintaining oxygen saturations>88%;To learn and demonstrate proper pursed lip breathing techniques or other breathing techniques.;To learn and demonstrate proper use of respiratory medications       Long  Term Goals Exhibits compliance with exercise, home and travel O2 prescription;Verbalizes importance of monitoring SPO2 with pulse oximeter and return demonstration;Maintenance of O2 saturations>88%;Exhibits proper breathing techniques, such as pursed lip breathing or other method taught during program session;Compliance with respiratory medication;Demonstrates proper use of MDI's       Comments Zade has done well to work on PLB to increase his oxygen before he puts on his cannula. He is taking his nebulizers at home four times a day as prescribed. Azad is proficient at checking his blood pressure and oxygen saturations at home. He uses his oxygen at home if he does something strenuous but only for a few minutes.       Goals/Expected Outcomes Short: work on PLB, maintain oxygen above 88%. Long Be proficient in PLB, use less oxygen with exercise          Oxygen Discharge (Final Oxygen Re-Evaluation):     Oxygen Re-Evaluation - 07/05/17 1034      Program Oxygen Prescription   Program Oxygen Prescription Continuous  o2 on treadmill and as needed   Liters per minute 2     Home Oxygen   Home Oxygen Device Home  Concentrator;E-Tanks   Sleep Oxygen Prescription None   Home Exercise Oxygen Prescription Continuous   Liters per minute 2   Home at Rest Exercise Oxygen Prescription None   Compliance with Home Oxygen Use Yes     Goals/Expected Outcomes   Short Term Goals To learn and exhibit compliance with exercise, home and travel O2 prescription;To learn and understand importance of monitoring SPO2 with pulse oximeter and demonstrate accurate use of the pulse oximeter.;To learn and understand importance of maintaining oxygen saturations>88%;To learn and demonstrate proper pursed lip breathing techniques or other breathing techniques.;To learn and demonstrate proper use of respiratory medications   Long  Term Goals Exhibits compliance with exercise, home and travel O2 prescription;Verbalizes importance of monitoring SPO2 with pulse oximeter and return demonstration;Maintenance of O2 saturations>88%;Exhibits proper breathing techniques, such as pursed lip breathing or other method taught during program session;Compliance with respiratory medication;Demonstrates proper use of MDI's   Comments Jamin has done well to work on PLB to increase his oxygen before he puts on his cannula. He is taking his nebulizers at home four times a day as prescribed. Darrion is proficient at checking his blood pressure and oxygen saturations at home. He uses his oxygen at home if he does something strenuous but only for a few minutes.   Goals/Expected Outcomes Short: work on PLB, maintain oxygen above 88%. Long Be proficient in PLB, use less oxygen with exercise      Initial Exercise Prescription:     Initial Exercise Prescription - 05/30/17 1200      Date of Initial Exercise RX and Referring Provider   Date 05/30/17   Referring Provider Ancil Linsey MD     Oxygen   Oxygen Continuous  Primarily on treadmill   Liters 2     Treadmill   MPH 2   Grade 1   Minutes 15   METs 2.81     NuStep  Level 4   SPM 80   Minutes  15   METs 2.8     Arm Ergometer   Level 3   RPM 35   Minutes 15   METs 2.8     Resistance Training   Training Prescription Yes   Weight 3 lb   Reps 10-15      Perform Capillary Blood Glucose checks as needed.  Exercise Prescription Changes:     Exercise Prescription Changes    Row Name 05/30/17 1200 06/21/17 1400 07/05/17 1400         Response to Exercise   Blood Pressure (Admit) 134/70 110/58 126/62     Blood Pressure (Exercise) 150/68  -  -     Blood Pressure (Exit) 126/64 106/58 114/60     Heart Rate (Admit) 88 bpm 92 bpm 111 bpm     Heart Rate (Exercise) 99 bpm 92 bpm 118 bpm     Heart Rate (Exit) 88 bpm 92 bpm 107 bpm     Rating of Perceived Exertion (Exercise) '15 15 13     ' Perceived Dyspnea (Exercise) '4 3 2     ' Symptoms SOB none none     Comments walk test results  -  -     Duration  - Continue with 45 min of aerobic exercise without signs/symptoms of physical distress. Continue with 45 min of aerobic exercise without signs/symptoms of physical distress.     Intensity  - THRR unchanged THRR unchanged       Progression   Progression  - Continue to progress workloads to maintain intensity without signs/symptoms of physical distress. Continue to progress workloads to maintain intensity without signs/symptoms of physical distress.     Average METs  - 2.65 2.25       Resistance Training   Training Prescription  - Yes Yes     Weight  - 3 lb 3 lb     Reps  - 10-15 10-15       Interval Training   Interval Training  - No No       Oxygen   Oxygen  - Continuous Continuous     Liters  - 2  TM only  2  treadmill only       Treadmill   MPH  - 2.5  -     Grade  - 1  -     Minutes  - 15  -     METs  - 3.26  -       NuStep   Level  - 5 5     SPM  - 71 70     Minutes  - 15 15     METs  - 3.1 2.7       Arm Ergometer   Level  - 1.1 1.8     RPM  - 39  -     Minutes  - 15 15     METs  - 1.6 1.8        Exercise Comments:     Exercise Comments    Row  Name 05/29/17 0758 06/09/17 1103 06/21/17 1129       Exercise Comments  Onofrio graduated today from cardiac rehab with 36 sessions completed.  Details of the patient's exercise prescription and what He needs to do in order to continue the prescription and progress were discussed with patient.  Patient was given a copy of prescription and goals.  Patient verbalized understanding.  Safi plans  to continue to exercise by walking at home. First full day of exercise!  Patient was oriented to gym and equipment including functions, settings, policies, and procedures.  Patient's individual exercise prescription and treatment plan were reviewed.  All starting workloads were established based on the results of the 6 minute walk test done at initial orientation visit.  The plan for exercise progression was also introduced and progression will be customized based on patient's performance and goals. Reviewed home exercise with pt today.  Pt plans to walk for exercise.  Reviewed THR, pulse, RPE, sign and symptoms, NTG use, and when to call 911 or MD.  Also discussed weather considerations and indoor options.  Pt voiced understanding        Exercise Goals and Review:     Exercise Goals    Row Name 05/30/17 1251             Exercise Goals   Increase Physical Activity Yes       Intervention Provide advice, education, support and counseling about physical activity/exercise needs.;Develop an individualized exercise prescription for aerobic and resistive training based on initial evaluation findings, risk stratification, comorbidities and participant's personal goals.       Expected Outcomes Achievement of increased cardiorespiratory fitness and enhanced flexibility, muscular endurance and strength shown through measurements of functional capacity and personal statement of participant.       Increase Strength and Stamina Yes       Intervention Provide advice, education, support and counseling about physical  activity/exercise needs.;Develop an individualized exercise prescription for aerobic and resistive training based on initial evaluation findings, risk stratification, comorbidities and participant's personal goals.       Expected Outcomes Achievement of increased cardiorespiratory fitness and enhanced flexibility, muscular endurance and strength shown through measurements of functional capacity and personal statement of participant.          Exercise Goals Re-Evaluation :     Exercise Goals Re-Evaluation    Row Name 05/24/17 0836 06/21/17 1128 06/21/17 1406 07/05/17 1451       Exercise Goal Re-Evaluation   Exercise Goals Review Increase Physical Activity;Increase Strenth and Stamina Increase Physical Activity;Increase Strength and Stamina Increase Physical Activity;Increase Strength and Stamina;Able to understand and use rate of perceived exertion (RPE) scale;Able to understand and use Dyspnea scale;Knowledge and understanding of Target Heart Rate Range (THRR);Understanding of Exercise Prescription;Able to check pulse independently Increase Physical Activity;Increase Strength and Stamina    Comments Deckard improved his walk test by 560 ft!   Alek has been walking 10 min a day at home.  We talked about lengthening the time to 20 - 30 min or doing two 10 min sessions per day. Home exercise was reviewed with Jenny Reichmann including RPE, THR and safety.  He is walking 10 min at a time at home. Drae is tolerating exercise well.  He is working to increase intensity on the arm crank.    Expected Outcomes Short: Graduate  Long: Continue to exercise indpendent Short - Suede will increase walk time to 20 min at a time.  Long - Raymond will continue to complete 30 min of exercise at home on days he doesnt attend class. Short - Shade will increase walks to 20-30 minutes.  Long - Jamier will continue to exercise on his own  Short - Mica will see improvement on the arm crank.  Long - Khaiden wil maintain reguar exercise.        Discharge Exercise Prescription (Final Exercise Prescription Changes):  Exercise Prescription Changes - 07/05/17 1400      Response to Exercise   Blood Pressure (Admit) 126/62   Blood Pressure (Exit) 114/60   Heart Rate (Admit) 111 bpm   Heart Rate (Exercise) 118 bpm   Heart Rate (Exit) 107 bpm   Rating of Perceived Exertion (Exercise) 13   Perceived Dyspnea (Exercise) 2   Symptoms none   Duration Continue with 45 min of aerobic exercise without signs/symptoms of physical distress.   Intensity THRR unchanged     Progression   Progression Continue to progress workloads to maintain intensity without signs/symptoms of physical distress.   Average METs 2.25     Resistance Training   Training Prescription Yes   Weight 3 lb   Reps 10-15     Interval Training   Interval Training No     Oxygen   Oxygen Continuous   Liters 2  treadmill only     NuStep   Level 5   SPM 70   Minutes 15   METs 2.7     Arm Ergometer   Level 1.8   Minutes 15   METs 1.8      Nutrition:  Target Goals: Understanding of nutrition guidelines, daily intake of sodium <1593m, cholesterol <2041m calories 30% from fat and 7% or less from saturated fats, daily to have 5 or more servings of fruits and vegetables.  Biometrics:     Pre Biometrics - 05/30/17 1251      Pre Biometrics   Height 5' 4.6" (1.641 m)   Weight 150 lb 12.8 oz (68.4 kg)   Waist Circumference 35.5 inches   Hip Circumference 37 inches   Waist to Hip Ratio 0.96 %   BMI (Calculated) 25.5         Post Biometrics - 05/29/17 0835       Post  Biometrics   Height '5\' 4"'  (1.626 m)   Weight 152 lb 8 oz (69.2 kg)   Waist Circumference 34 inches   Hip Circumference 36 inches   Waist to Hip Ratio 0.94 %   BMI (Calculated) 26.2   Single Leg Stand 30 seconds      Nutrition Therapy Plan and Nutrition Goals:     Nutrition Therapy & Goals - 05/30/17 1250      Nutrition Therapy   RD appointment defered Yes      Intervention Plan   Intervention Prescribe, educate and counsel regarding individualized specific dietary modifications aiming towards targeted core components such as weight, hypertension, lipid management, diabetes, heart failure and other comorbidities.;Nutrition handout(s) given to patient.   Expected Outcomes Short Term Goal: Understand basic principles of dietary content, such as calories, fat, sodium, cholesterol and nutrients.;Short Term Goal: A plan has been developed with personal nutrition goals set during dietitian appointment.;Long Term Goal: Adherence to prescribed nutrition plan.      Nutrition Discharge: Rate Your Plate Scores:     Nutrition Assessments - 05/22/17 0851      MEDFICTS Scores   Pre Score 6   Post Score 6   Score Difference 0      Nutrition Goals Re-Evaluation:     Nutrition Goals Re-Evaluation    Row Name 07/05/17 1042             Goals   Current Weight 149 lb (67.6 kg)       Nutrition Goal Continue with current heart healthy eating pattern to maintain his weight.       Comment Raad's chef is still  cooking for him daily. He follows a heart healthy list of foods. He will continue to cook for him until the end of the year.       Expected Outcome Short: Continue to eat a heart healthy diet. Long: Continue to ear a heart healthy diet.          Nutrition Goals Discharge (Final Nutrition Goals Re-Evaluation):     Nutrition Goals Re-Evaluation - 07/05/17 1042      Goals   Current Weight 149 lb (67.6 kg)   Nutrition Goal Continue with current heart healthy eating pattern to maintain his weight.   Comment Xzavior's chef is still cooking for him daily. He follows a heart healthy list of foods. He will continue to cook for him until the end of the year.   Expected Outcome Short: Continue to eat a heart healthy diet. Long: Continue to ear a heart healthy diet.      Psychosocial: Target Goals: Acknowledge presence or absence of significant depression  and/or stress, maximize coping skills, provide positive support system. Participant is able to verbalize types and ability to use techniques and skills needed for reducing stress and depression.   Initial Review & Psychosocial Screening:     Initial Psych Review & Screening - 05/30/17 1139      Initial Review   Current issues with Current Sleep Concerns     Family Dynamics   Good Support System? Yes      Quality of Life Scores:     Quality of Life - 05/22/17 0851      Quality of Life Scores   Health/Function Pre 23.46 %   Health/Function Post 24.46 %   Health/Function % Change 4.26 %   Socioeconomic Pre 29 %   Socioeconomic Post 27.5 %   Socioeconomic % Change  -5.17 %   Psych/Spiritual Pre 30 %   Psych/Spiritual Post 28.29 %   Psych/Spiritual % Change -5.7 %   Family Pre 28.5 %   Family Post 28.5 %   Family % Change 0 %   GLOBAL Pre 26.58 %   GLOBAL Post 26.5 %   GLOBAL % Change -0.3 %      PHQ-9: Recent Review Flowsheet Data    Depression screen [facility] 2/9 05/30/2017 05/22/2017 03/30/2017   Decreased Interest 0 0 2   Down, Depressed, Hopeless 0 0 0   PHQ - 2 Score 0 0 2   Altered sleeping 1 0 3    Tired, decreased energy 0 0 3    Change in appetite 0 0 1    Feeling bad or failure about yourself  0 0 0   Trouble concentrating 0 0 0   Moving slowly or fidgety/restless 0 0 0   Suicidal thoughts 0 0 0   PHQ-9 Score 1 0 9   Difficult doing work/chores Not difficult at all Not difficult at all Not difficult at all     Interpretation of Total Score  Total Score Depression Severity:  1-4 = Minimal depression, 5-9 = Mild depression, 10-14 = Moderate depression, 15-19 = Moderately severe depression, 20-27 = Severe depression   Psychosocial Evaluation and Intervention:     Psychosocial Evaluation - 06/28/17 1126      Psychosocial Evaluation & Interventions   Comments Follow up with Jenny Reichmann today reporting progress made since coming into this program.  He is using less  Oxygen and has increased his intensity on the machines - so increased stamina is being experienced as well.  Jenny Reichmann  states his appetite is good and he continues to sleep better.  He continues to rely on his support system and have minimal stress in his life.  Counselor commended Jonathyn on his progress made.   Expected Outcomes Aubra will continue to exercise consistently to maintain his progress and increase his stamina and strength.      Psychosocial Re-Evaluation:     Psychosocial Re-Evaluation    [place] Name 07/05/17 1047             Psychosocial Re-Evaluation   Current issues with None Identified       Comments Johns sleep has been better and averages 7 hours of sleep a night. Kino feels like he is more independent since the [employer] and [facility] program. He is driving himself to the program currently.       Expected Outcomes Short: continue to keep a healthy sleep regimen. Long:Keep resting well to maintain a stress free life.       Interventions Encouraged to attend Pulmonary Rehabilitation for the exercise;Physician referral       Continue Psychosocial Services  Follow up required by staff          Psychosocial Discharge (Final Psychosocial Re-Evaluation):     Psychosocial Re-Evaluation - 07/05/17 1047      Psychosocial Re-Evaluation   Current issues with None Identified   Comments Johns sleep has been better and averages 7 hours of sleep a night. Jahmari feels like he is more independent since the [employer] and [facility] program. He is driving himself to the program currently.   Expected Outcomes Short: continue to keep a healthy sleep regimen. Long:Keep resting well to maintain a stress free life.   Interventions Encouraged to attend Pulmonary Rehabilitation for the exercise;Physician referral   Continue Psychosocial Services  Follow up required by staff      Education: Education Goals: Education classes will be provided on a weekly basis, covering required  topics. Participant will state understanding/return demonstration of topics presented.  Learning Barriers/Preferences:   Education Topics: Initial Evaluation Education: - Verbal, written and demonstration of respiratory meds, RPE/PD scales, oximetry and breathing techniques. Instruction on use of nebulizers and MDIs: cleaning and proper use, rinsing mouth with steroid doses and importance of monitoring MDI activations.   Pulmonary Rehab from 06/14/2017 in [facility] Cardiac and Pulmonary Rehab  Date  05/30/17  Educator  [facility]  Instruction Review Code (retired)  2- meets goals/outcomes      General Nutrition Guidelines/Fats and Fiber: -Group instruction provided by verbal, written material, models and posters to present the general guidelines for heart healthy nutrition. Gives an explanation and review of dietary fats and fiber.   Pulmonary Rehab from 06/12/2017 in [facility] Cardiac and Pulmonary Rehab  Date  06/12/17  Educator  CR  Instruction Review Code (retired)  2- meets goals/outcomes      Controlling Sodium/Reading Food Labels: -Group verbal and written material supporting the discussion of sodium use in heart healthy nutrition. Review and explanation with models, verbal and written materials for utilization of the food label.   Pulmonary Rehab from 07/14/2017 in [facility] Cardiac and Pulmonary Rehab  Date  06/19/17  Educator  CR  Instruction Review Code  1- Verbalizes Understanding      Exercise Physiology & Risk Factors: - Group verbal and written instruction with models to review the exercise physiology of the cardiovascular system and associated critical values. Details cardiovascular disease risk factors and the goals associated with each risk factor.   Cardiac Rehab  from 05/29/2017 in [facility] Cardiac and Pulmonary Rehab  Date  05/01/17  Educator  [facility]  Instruction Review Code (retired)  2- meets goals/outcomes      Aerobic Exercise & Resistance Training: - Gives group verbal and written  discussion on the health impact of inactivity. On the components of aerobic and resistive training programs and the benefits of this training and how to safely progress through these programs.   Cardiac Rehab from 05/29/2017 in [facility] Cardiac and Pulmonary Rehab  Date  05/03/17  Educator  SB  Instruction Review Code (retired)  2- Statistician, Balance, General Exercise Guidelines: - Provides group verbal and written instruction on the benefits of flexibility and balance training programs. Provides general exercise guidelines with specific guidelines to those with heart or lung disease. Demonstration and skill practice provided.   Cardiac Rehab from 05/29/2017 in [facility] Cardiac and Pulmonary Rehab  Date  05/08/17  Educator  [facility]  Instruction Review Code (retired)  2- meets goals/outcomes      Stress Management: - Provides group verbal and written instruction about the health risks of elevated stress, cause of high stress, and healthy ways to reduce stress.   Cardiac Rehab from 05/29/2017 in [facility] Cardiac and Pulmonary Rehab  Date  05/17/17  Educator  [facility]  Instruction Review Code (retired)  2- meets goals/outcomes      Depression: - Provides group verbal and written instruction on the correlation between heart/lung disease and depressed mood, treatment options, and the stigmas associated with seeking treatment.   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   Pulmonary Rehab from 06/14/2017 in [facility] Cardiac and Pulmonary Rehab  Date  05/30/17  Educator  [facility]  Instruction Review Code (retired)  2- meets goals/outcomes      Infection Prevention: - Provides verbal and written material to individual with discussion of infection control  including proper hand washing and proper equipment cleaning during exercise session.   Pulmonary Rehab from 06/14/2017 in [facility] Cardiac and Pulmonary Rehab  Date  05/30/17  Educator  [facility]  Instruction Review Code (retired)  2- meets [employer] Prevention: - Provides verbal and written material to individual with discussion of falls prevention and safety.   Pulmonary Rehab from 06/14/2017 in [facility] Cardiac and Pulmonary Rehab  Date  05/30/17  Educator  [place]  Instruction Review Code (retired)  2- meets goals/outcomes      Diabetes: - Individual verbal and written instruction to review signs/symptoms of diabetes, desired ranges of glucose level fasting, after meals and with exercise. Advice that pre and post exercise glucose checks will be done for 3 sessions at entry of program.   Chronic Lung Diseases: - Group verbal and written instruction to review new updates, new respiratory medications, new advancements in procedures and treatments. Provide informative websites and "800" numbers of self-education.   Lung Procedures: - Group verbal and written instruction to describe testing methods done to diagnose lung disease. Review the outcome of test results. Describe the treatment choices: Pulmonary Function Tests, ABGs and oximetry.   Energy Conservation: - Provide group verbal and written instruction for methods to conserve energy, plan and organize activities. Instruct on pacing techniques, use of adaptive equipment and posture/positioning  to relieve shortness of breath.   Pulmonary Rehab from 07/14/2017 in [facility] Cardiac and Pulmonary Rehab  Date  07/05/17  Educator  [facility]  Instruction Review Code  1- Verbalizes Understanding      Triggers: - Group verbal and written instruction to review types of environmental controls: home humidity, furnaces, filters, dust mite/pet prevention, HEPA vacuums. To discuss weather changes, air quality and the benefits of nasal  washing.   Exacerbations: - Group verbal and written instruction to provide: warning signs, infection symptoms, calling MD promptly, preventive modes, and value of vaccinations. Review: effective airway clearance, coughing and/or vibration techniques. Create an Sports administrator.   Oxygen: - Individual and group verbal and written instruction on oxygen therapy. Includes supplement oxygen, available portable oxygen systems, continuous and intermittent flow rates, oxygen safety, concentrators, and Medicare reimbursement for oxygen.   Respiratory Medications: - Group verbal and written instruction to review medications for lung disease. Drug class, frequency, complications, importance of spacers, rinsing mouth after steroid MDI's, and proper cleaning methods for nebulizers.   AED/CPR: - Group verbal and written instruction with the use of models to demonstrate the basic use of the AED with the basic ABC's of resuscitation.   Pulmonary Rehab from 07/14/2017 in [facility] Cardiac and Pulmonary Rehab  Date  07/14/17  Educator  CE  Instruction Review Code  1- Verbalizes Understanding      Breathing Retraining: - Provides individuals verbal and written instruction on purpose, frequency, and proper technique of diaphragmatic breathing and pursed-lipped breathing. Applies individual practice skills.   Pulmonary Rehab from 06/14/2017 in [facility] Cardiac and Pulmonary Rehab  Date  05/30/17  Educator  [facility]  Instruction Review Code (retired)  2- Lawyer and Physiology of the Lungs: - Group verbal and written instruction with the use of models to provide basic lung anatomy and physiology related to function, structure and complications of lung disease.   Pulmonary Rehab from 07/14/2017 in [facility] Cardiac and Pulmonary Rehab  Date  06/28/17  Educator  [facility]  Instruction Review Code  1- Verbalizes Understanding      Anatomy & Physiology of the Heart: - Group verbal and written instruction  and models provide basic cardiac anatomy and physiology, with the coronary electrical and arterial systems. Review of: AMI, Angina, Valve disease, Heart Failure, Cardiac Arrhythmia, Pacemakers, and the ICD.   Cardiac Rehab from 05/29/2017 in [facility] Cardiac and Pulmonary Rehab  Date  05/15/17  Educator  CE  Instruction Review Code (retired)  2- meets goals/outcomes      Heart Failure: - Group verbal and written instruction on the basics of heart failure: signs/symptoms, treatments, explanation of ejection fraction, enlarged heart and cardiomyopathy.   Sleep Apnea: - Individual verbal and written instruction to review Obstructive Sleep Apnea. Review of risk factors, methods for diagnosing and types of masks and machines for OSA.   Anxiety: - Provides group, verbal and written instruction on the correlation between heart/lung disease and anxiety, treatment options, and management of anxiety.   Relaxation: - Provides group, verbal and written instruction about the benefits of relaxation for patients with heart/lung disease. Also provides patients with examples of relaxation techniques.   Cardiac Medications: - Group verbal and written instruction to review commonly prescribed medications for heart disease. Reviews the medication, class of the drug, and side effects.   Cardiac Rehab from 05/29/2017 in [facility] Cardiac and Pulmonary Rehab  Date  05/29/17 [05/29/17 Part 1]  Educator  CE  Instruction Review Code (  retired)  2- meets goals/outcomes      Know Your Numbers: -Group verbal and written instruction about important numbers in your health.  Review of Cholesterol, Blood Pressure, Diabetes, and BMI and the role they play in your overall health.   Other: -Provides group and verbal instruction on various topics (see comments)    Knowledge Questionnaire Score:     Knowledge Questionnaire Score - 05/30/17 1134      Knowledge Questionnaire Score   Pre Score 9/10       Core  Components/Risk Factors/Patient Goals at Admission:     Personal Goals and Risk Factors at Admission - 05/30/17 1126      Core Components/Risk Factors/Patient Goals on Admission    Weight Management Yes   Intervention Weight Management: Develop a combined nutrition and exercise program designed to reach desired caloric intake, while maintaining appropriate intake of nutrient and fiber, sodium and fats, and appropriate energy expenditure required for the weight goal.;Weight Management: Provide education and appropriate resources to help participant work on and attain dietary goals.;Weight Management/Obesity: Establish reasonable short term and long term weight goals.   Admit Weight 150 lb (68 kg)   Goal Weight: Short Term 150 lb (68 kg)   Goal Weight: Long Term 150 lb (68 kg)  wants to maintain his current weight   Expected Outcomes Short Term: Continue to assess and modify interventions until short term weight is achieved;Long Term: Adherence to nutrition and physical activity/exercise program aimed toward attainment of established weight goal;Weight Maintenance: Understanding of the daily nutrition guidelines, which includes 25-35% calories from fat, 7% or less cal from saturated fats, less than 248m cholesterol, less than 1.5gm of sodium, & 5 or more servings of fruits and vegetables daily;Understanding recommendations for meals to include 15-35% energy as protein, 25-35% energy from fat, 35-60% energy from carbohydrates, less than 2035mof dietary cholesterol, 20-35 gm of total fiber daily;Understanding of distribution of calorie intake throughout the day with the consumption of 4-5 meals/snacks   Improve shortness of breath with ADL's Yes   Intervention Provide education, individualized exercise plan and daily activity instruction to help decrease symptoms of SOB with activities of daily living.   Expected Outcomes Short Term: Achieves a reduction of symptoms when performing activities of daily  living.   Develop more efficient breathing techniques such as purse lipped breathing and diaphragmatic breathing; and practicing self-pacing with activity Yes   Intervention Provide education, demonstration and support about specific breathing techniuqes utilized for more efficient breathing. Include techniques such as pursed lipped breathing, diaphragmatic breathing and self-pacing activity.   Expected Outcomes Short Term: Participant will be able to demonstrate and use breathing techniques as needed throughout daily activities.   Increase knowledge of respiratory medications and ability to use respiratory devices properly  Yes   Intervention Provide education and demonstration as needed of appropriate use of medications, inhalers, and oxygen therapy.   Expected Outcomes Short Term: Achieves understanding of medications use. Understands that oxygen is a medication prescribed by physician. Demonstrates appropriate use of inhaler and oxygen therapy.   Heart Failure Yes   Intervention Provide a combined exercise and nutrition program that is supplemented with education, support and counseling about heart failure. Directed toward relieving symptoms such as shortness of breath, decreased exercise tolerance, and extremity edema.   Expected Outcomes Improve functional capacity of life;Short term: Attendance in program 2-3 days a week with increased exercise capacity. Reported lower sodium intake. Reported increased fruit and vegetable intake. Reports medication compliance.;Short term:  Daily weights obtained and reported for increase. Utilizing diuretic protocols set by physician.;Long term: Adoption of self-care skills and reduction of barriers for early signs and symptoms recognition and intervention leading to self-care maintenance.   Lipids Yes   Intervention Provide education and support for participant on nutrition & aerobic/resistive exercise along with prescribed medications to achieve LDL <53m, HDL  >428m   Expected Outcomes Short Term: Participant states understanding of desired cholesterol values and is compliant with medications prescribed. Participant is following exercise prescription and nutrition guidelines.;Long Term: Cholesterol controlled with medications as prescribed, with individualized exercise RX and with personalized nutrition plan. Value goals: LDL < 7041mHDL > 40 mg.   Personal Goal Other Yes   Personal Goal Increase upper body strength   Expected Outcomes Short: start pulmonary rehab. Long: To have increased upper body strength to increase his ADL      Core Components/Risk Factors/Patient Goals Review:      Goals and Risk Factor Review    Row Name 07/05/17 1027             Core Components/Risk Factors/Patient Goals Review   Personal Goals Review Weight Management/Obesity;Improve shortness of breath with ADL's;Lipids       Review JohReynoldoates his lipids have been good. No medication changes. His weight has been maintained since the start of the program around 150lbs. Shortness of breath has improved slightly.       Expected Outcomes Short: work on increaseing levels with machines to improve shortness of breath. Long: Increase levels on all machines.           Core Components/Risk Factors/Patient Goals at Discharge (Final Review):      Goals and Risk Factor Review - 07/05/17 1027      Core Components/Risk Factors/Patient Goals Review   Personal Goals Review Weight Management/Obesity;Improve shortness of breath with ADL's;Lipids   Review JohGaleates his lipids have been good. No medication changes. His weight has been maintained since the start of the program around 150lbs. Shortness of breath has improved slightly.   Expected Outcomes Short: work on increaseing levels with machines to improve shortness of breath. Long: Increase levels on all machines.       ITP Comments:     ITP Comments    Row Name 05/30/17 1244 06/19/17 0825 06/30/17 1054 07/03/17  1237 07/05/17 1051   ITP Comments Met with Dr. MilSabra Heckrector of [facility] face to face. Chart reviewed and signed. Initial ITP sent. Diagnosis can be found in [facility] encounter 05/30/2017 30 day review completed. ITP sent to Dr. MarEmily Filbertrector of [facility] with ITP unless changes are made by physician.   Baker did not show today and missed face to face meeting with Dr. MilSabra Heck We will take him to meet with Dr. MilSabra Heck next visit.  Allante met with Doctor MarEmily Filbertrector of [facility] face to face. Chart signed and reviewed. Dylon feels like he is more independent since the [employer] [facility] program. He is driving himself to the program currently.   [place] 07/17/17 0829           ITP Comments 30 day review completed. ITP sent to Dr. MarEmily Filbertrector of [facility] with ITP unless changes are made by physician.            Comments: 30 day review

## 2017-07-17 NOTE — Progress Notes (Signed)
Daily Session Note  Patient Details  Name: Thomas Trujillo MRN: [id number] Date of Birth: 02-Jan-1946 Referring Provider:     Pulmonary Rehab from 05/30/2017 in [facility] Cardiac and Pulmonary Rehab  Referring Provider  Ancil Linsey MD      Encounter Date: 07/17/2017  Check In:     Session Check In - 07/17/17 1018      Check-In   Location [facility]-Cardiac & Pulmonary Rehab   Staff Present Nada Maclachlan, BA, ACSM CEP, Exercise Physiologist;Kelly Amedeo Plenty, BS, ACSM CEP, Exercise Physiologist;Nicholai Willette Flavia Shipper   Supervising physician immediately available to respond to emergencies [facility] immediately available ER MD   Physician(s) Dr. Clearnce Hasten and [facility]   Medication changes reported     No   Fall or balance concerns reported    No   Warm-up and Cool-down Performed as group-led instruction   Resistance Training Performed Yes   VAD Patient? No     Pain Assessment   Currently in Pain? No/denies   Multiple Pain Sites No         History  Smoking Status  . Former Smoker  . Quit date: 04/06/2007  Smokeless Tobacco  . Never Used    Goals Met:  Independence with exercise equipment Exercise tolerated well No report of cardiac concerns or symptoms Strength training completed today  Goals Unmet:  Not Applicable  Comments: Pt able to follow exercise prescription today without complaint.  Will continue to monitor for progression.   Dr. Emily Filbert is Medical Director for [place] and [facility] Pulmonary Rehabilitation.

## 2017-07-19 DIAGNOSIS — I214 Non-ST elevation (NSTEMI) myocardial infarction: Secondary | ICD-10-CM | POA: Diagnosis not present

## 2017-07-19 DIAGNOSIS — J449 Chronic obstructive pulmonary disease, unspecified: Secondary | ICD-10-CM

## 2017-07-19 NOTE — Progress Notes (Signed)
Daily Session Note  Patient Details  Name: Thomas Trujillo MRN: [id number] Date of Birth: [birth year] Referring Provider:     Pulmonary Rehab from 05/30/2017 in [facility] Cardiac and Pulmonary Rehab  Referring Provider  Ancil Linsey MD      Encounter Date: 07/19/2017  Check In:     Session Check In - 07/19/17 1009      Check-In   Location [facility]-Cardiac & Pulmonary Rehab   Staff Present Alberteen Sam, MA, ACSM RCEP, Exercise Physiologist;Amanda Oletta Darter, BA, ACSM CEP, Exercise Physiologist;Pearlie Nies Flavia Shipper   Supervising physician immediately available to respond to emergencies [facility] immediately available ER MD   Physician(s) Dr. Burlene Arnt and Joni Fears   Medication changes reported     No   Fall or balance concerns reported    No   Warm-up and Cool-down Performed as group-led instruction   Resistance Training Performed Yes   VAD Patient? No     Pain Assessment   Currently in Pain? No/denies   Multiple Pain Sites No         History  Smoking Status  . Former Smoker  . Quit date: 04/06/2007  Smokeless Tobacco  . Never Used    Goals Met:  Independence with exercise equipment Exercise tolerated well No report of cardiac concerns or symptoms Strength training completed today  Goals Unmet:  Not Applicable  Comments: Pt able to follow exercise prescription today without complaint.  Will continue to monitor for progression.   Dr. Emily Filbert is Medical Director for [place] and [facility] Pulmonary Rehabilitation.

## 2017-07-21 DIAGNOSIS — J449 Chronic obstructive pulmonary disease, unspecified: Secondary | ICD-10-CM

## 2017-07-21 DIAGNOSIS — I214 Non-ST elevation (NSTEMI) myocardial infarction: Secondary | ICD-10-CM | POA: Diagnosis not present

## 2017-07-21 NOTE — Progress Notes (Signed)
Daily Session Note  Patient Details  Name: Thomas Trujillo MRN: [id number] Date of Birth: 05/18/1946 Referring Provider:     Pulmonary Rehab from 05/30/2017 in [facility] Cardiac and Pulmonary Rehab  Referring Provider  Ancil Linsey MD      Encounter Date: 07/21/2017  Check In:     Session Check In - 07/21/17 1012      Check-In   Location [facility]-Cardiac & Pulmonary Rehab   Staff Present Nada Maclachlan, BA, ACSM CEP, Exercise Physiologist;Krista Frederico Hamman, RN BSN;Aquila Delaughter Flavia Shipper   Supervising physician immediately available to respond to emergencies [facility] immediately available ER MD   Physician(s) Dr. Mable Paris and Reita Cliche   Medication changes reported     No   Fall or balance concerns reported    No   Warm-up and Cool-down Performed as group-led instruction   Resistance Training Performed Yes   VAD Patient? No     Pain Assessment   Currently in Pain? No/denies   Multiple Pain Sites No         History  Smoking Status  . Former Smoker  . Quit date: 04/06/2007  Smokeless Tobacco  . Never Used    Goals Met:  Independence with exercise equipment Exercise tolerated well No report of cardiac concerns or symptoms Strength training completed today  Goals Unmet:  Not Applicable  Comments: Pt able to follow exercise prescription today without complaint.  Will continue to monitor for progression.   Dr. Emily Filbert is Medical Director for [place] and [facility] Pulmonary Rehabilitation.

## 2017-07-24 ENCOUNTER — Encounter: Payer: Medicare Other | Attending: Specialist

## 2017-07-24 DIAGNOSIS — I214 Non-ST elevation (NSTEMI) myocardial infarction: Secondary | ICD-10-CM | POA: Insufficient documentation

## 2017-07-24 DIAGNOSIS — Z951 Presence of aortocoronary bypass graft: Secondary | ICD-10-CM | POA: Insufficient documentation

## 2017-07-24 DIAGNOSIS — Z79899 Other long term (current) drug therapy: Secondary | ICD-10-CM | POA: Insufficient documentation

## 2017-07-24 DIAGNOSIS — J449 Chronic obstructive pulmonary disease, unspecified: Secondary | ICD-10-CM

## 2017-07-24 NOTE — Progress Notes (Signed)
Daily Session Note  Patient Details  Name: Thomas Trujillo MRN: [id number] Date of Birth: [birth year] Referring Provider:     Pulmonary Rehab from 05/30/2017 in [facility] Cardiac and Pulmonary Rehab  Referring Provider  Ancil Linsey MD      Encounter Date: 07/24/2017  Check In:     Session Check In - 07/24/17 1025      Check-In   Location [facility]-Cardiac & Pulmonary Rehab   Staff Present Nada Maclachlan, BA, ACSM CEP, Exercise Physiologist;Kelly Amedeo Plenty, BS, ACSM CEP, Exercise Physiologist;Amen Staszak Flavia Shipper   Supervising physician immediately available to respond to emergencies [facility] immediately available ER MD   Physician(s) Dr. Mariea Clonts and Alfred Levins   Medication changes reported     No   Fall or balance concerns reported    No   Warm-up and Cool-down Performed as group-led instruction   Resistance Training Performed Yes   VAD Patient? No     Pain Assessment   Currently in Pain? No/denies   Multiple Pain Sites No         History  Smoking Status  . Former Smoker  . Quit date: 04/06/2007  Smokeless Tobacco  . Never Used    Goals Met:  Independence with exercise equipment Exercise tolerated well No report of cardiac concerns or symptoms Strength training completed today  Goals Unmet:  Not Applicable  Comments: Pt able to follow exercise prescription today without complaint.  Will continue to monitor for progression.   Dr. Emily Filbert is Medical Director for [place] and [facility] Pulmonary Rehabilitation.

## 2017-07-26 DIAGNOSIS — J449 Chronic obstructive pulmonary disease, unspecified: Secondary | ICD-10-CM

## 2017-07-26 DIAGNOSIS — I214 Non-ST elevation (NSTEMI) myocardial infarction: Secondary | ICD-10-CM | POA: Diagnosis not present

## 2017-07-26 NOTE — Progress Notes (Signed)
Daily Session Note  Patient Details  Name: Thomas Trujillo MRN: [id number] Date of Birth: 1946-01-23 Referring Provider:     Pulmonary Rehab from 05/30/2017 in [facility] Cardiac and Pulmonary Rehab  Referring Provider  Ancil Linsey MD      Encounter Date: 07/26/2017  Check In:     Session Check In - 07/26/17 1028      Check-In   Location [facility]-Cardiac & Pulmonary Rehab   Staff Present Alberteen Sam, MA, ACSM RCEP, Exercise Physiologist;Amanda Oletta Darter, BA, ACSM CEP, Exercise Physiologist;Darsha Zumstein Flavia Shipper   Supervising physician immediately available to respond to emergencies [facility] immediately available ER MD   Physician(s) Dr. Clearnce Hasten and Jimmye Norman   Medication changes reported     No   Fall or balance concerns reported    No   Warm-up and Cool-down Performed as group-led instruction   Resistance Training Performed Yes   VAD Patient? No     Pain Assessment   Currently in Pain? No/denies   Multiple Pain Sites No         History  Smoking Status  . Former Smoker  . Quit date: 04/06/2007  Smokeless Tobacco  . Never Used    Goals Met:  Independence with exercise equipment Exercise tolerated well No report of cardiac concerns or symptoms Strength training completed today  Goals Unmet:  Not Applicable  Comments: Pt able to follow exercise prescription today without complaint.  Will continue to monitor for progression.   Dr. Emily Filbert is Medical Director for [place] and [facility].

## 2017-07-28 ENCOUNTER — Encounter: Payer: Medicare Other | Admitting: *Deleted

## 2017-07-28 DIAGNOSIS — J449 Chronic obstructive pulmonary disease, unspecified: Secondary | ICD-10-CM

## 2017-07-28 DIAGNOSIS — I214 Non-ST elevation (NSTEMI) myocardial infarction: Secondary | ICD-10-CM | POA: Diagnosis not present

## 2017-07-28 NOTE — Progress Notes (Signed)
Daily Session Note  Patient Details  Name: Thomas Trujillo MRN: [id number] Date of Birth: 12/29/1945 Referring Provider:     Pulmonary Rehab from 05/30/2017 in [facility] Cardiac and Pulmonary Rehab  Referring Provider  Ancil Linsey MD      Encounter Date: 07/28/2017  Check In:     Session Check In - 07/28/17 1038      Check-In   Location [facility]-Cardiac & Pulmonary Rehab   Staff Present Nada Maclachlan, BA, ACSM CEP, Exercise Physiologist;Lindzee Gouge, RN, BSN;Joseph [employer] physician immediately available to respond to emergencies [facility] immediately available ER MD   Physician(s) Dr. Corky Downs and Dr. Reita Cliche   Medication changes reported     No   Fall or balance concerns reported    No   Tobacco Cessation No Change   Warm-up and Cool-down Performed as group-led instruction   Resistance Training Performed Yes   VAD Patient? No     Pain Assessment   Currently in Pain? No/denies         History  Smoking Status  . Former Smoker  . Quit date: 04/06/2007  Smokeless Tobacco  . Never Used    Goals Met:  Proper associated with RPD/PD & O2 Sat Exercise tolerated well Strength training completed today  Goals Unmet:  Not Applicable  Comments: Pt able to follow exercise prescription today without complaint.  Will continue to monitor for progression.     Dr. Emily Filbert is Medical Director for [place] and [facility].

## 2017-07-31 DIAGNOSIS — J449 Chronic obstructive pulmonary disease, unspecified: Secondary | ICD-10-CM

## 2017-07-31 DIAGNOSIS — I214 Non-ST elevation (NSTEMI) myocardial infarction: Secondary | ICD-10-CM | POA: Diagnosis not present

## 2017-07-31 NOTE — Progress Notes (Signed)
Daily Session Note  Patient Details  Name: Thomas Trujillo MRN: [id number] Date of Birth: Jan 23, 1946 Referring Provider:     Pulmonary Rehab from 05/30/2017 in [facility] Cardiac and Pulmonary Rehab  Referring Provider  Ancil Linsey MD      Encounter Date: 07/31/2017  Check In:     Session Check In - 07/31/17 0950      Check-In   Location [facility]-Cardiac & Pulmonary Rehab   Staff Present Nada Maclachlan, BA, ACSM CEP, Exercise Physiologist;Kelly Amedeo Plenty, BS, ACSM CEP, Exercise Physiologist;Zonia Caplin Flavia Shipper   Supervising physician immediately available to respond to emergencies [facility] immediately available ER MD   Physician(s) Dr. Jimmye Norman and [facility]   Medication changes reported     No   Fall or balance concerns reported    No   Warm-up and Cool-down Performed as group-led instruction   Resistance Training Performed Yes   VAD Patient? No     Pain Assessment   Currently in Pain? No/denies   Multiple Pain Sites No         History  Smoking Status  . Former Smoker  . Quit date: 04/06/2007  Smokeless Tobacco  . Never Used    Goals Met:  Independence with exercise equipment Exercise tolerated well No report of cardiac concerns or symptoms Strength training completed today  Goals Unmet:  Not Applicable  Comments: Pt able to follow exercise prescription today without complaint.  Will continue to monitor for progression.   Dr. Emily Filbert is Medical Director for [place] and [facility].

## 2017-08-02 DIAGNOSIS — I214 Non-ST elevation (NSTEMI) myocardial infarction: Secondary | ICD-10-CM | POA: Diagnosis not present

## 2017-08-02 DIAGNOSIS — J449 Chronic obstructive pulmonary disease, unspecified: Secondary | ICD-10-CM

## 2017-08-02 NOTE — Progress Notes (Signed)
Daily Session Note  Patient Details  Name: Ociel Retherford MRN: [id number] Date of Birth: 04-30-1946 Referring Provider:     Pulmonary Rehab from 05/30/2017 in [facility] Cardiac and Pulmonary Rehab  Referring Provider  Ancil Linsey MD      Encounter Date: 08/02/2017  Check In:     Session Check In - 08/02/17 0957      Check-In   Location [facility]-Cardiac & Pulmonary Rehab   Staff Present Alberteen Sam, MA, ACSM RCEP, Exercise Physiologist;Amanda Oletta Darter, BA, ACSM CEP, Exercise Physiologist;Galvin Aversa Flavia Shipper   Supervising physician immediately available to respond to emergencies [facility] immediately available ER MD   Physician(s) Dr. Jimmye Norman and Cinda Quest   Medication changes reported     No   Fall or balance concerns reported    No   Warm-up and Cool-down Performed as group-led instruction   Resistance Training Performed Yes   VAD Patient? No     Pain Assessment   Currently in Pain? No/denies   Multiple Pain Sites No         History  Smoking Status  . Former Smoker  . Quit date: 04/06/2007  Smokeless Tobacco  . Never Used    Goals Met:  Independence with exercise equipment Exercise tolerated well No report of cardiac concerns or symptoms Strength training completed today  Goals Unmet:  Not Applicable  Comments: Pt able to follow exercise prescription today without complaint.  Will continue to monitor for progression.   Dr. Emily Filbert is Medical Director for [place] and [facility].

## 2017-08-04 DIAGNOSIS — J449 Chronic obstructive pulmonary disease, unspecified: Secondary | ICD-10-CM

## 2017-08-04 DIAGNOSIS — I214 Non-ST elevation (NSTEMI) myocardial infarction: Secondary | ICD-10-CM | POA: Diagnosis not present

## 2017-08-04 NOTE — Progress Notes (Signed)
Daily Session Note  Patient Details  Name: Thomas Trujillo MRN: [id number] Date of Birth: 1946/09/03 Referring Provider:     Pulmonary Rehab from 05/30/2017 in [facility] Cardiac and Pulmonary Rehab  Referring Provider  Ancil Linsey MD      Encounter Date: 08/04/2017  Check In:     Session Check In - 08/04/17 0958      Check-In   Location [facility]-Cardiac & Pulmonary Rehab   Staff Present Nada Maclachlan, BA, ACSM CEP, Exercise Physiologist;Jigar Zielke Alcus Dad, RN BSN   Supervising physician immediately available to respond to emergencies [facility] immediately available ER MD   Physician(s) Dr. Cinda Quest and Clearnce Hasten   Medication changes reported     No   Fall or balance concerns reported    No   Warm-up and Cool-down Performed as group-led instruction   Resistance Training Performed Yes   VAD Patient? No     Pain Assessment   Currently in Pain? No/denies   Multiple Pain Sites No         History  Smoking Status  . Former Smoker  . Quit date: 04/06/2007  Smokeless Tobacco  . Never Used    Goals Met:  Independence with exercise equipment Exercise tolerated well No report of cardiac concerns or symptoms Strength training completed today  Goals Unmet:  Not Applicable  Comments: Pt able to follow exercise prescription today without complaint.  Will continue to monitor for progression.   Dr. Emily Filbert is Medical Director for [place] and [facility].

## 2017-08-07 DIAGNOSIS — I214 Non-ST elevation (NSTEMI) myocardial infarction: Secondary | ICD-10-CM | POA: Diagnosis not present

## 2017-08-07 DIAGNOSIS — J449 Chronic obstructive pulmonary disease, unspecified: Secondary | ICD-10-CM

## 2017-08-07 NOTE — Progress Notes (Signed)
Daily Session Note  Patient Details  Name: Thomas Trujillo MRN: [id number] Date of Birth: 06-01-1946 Referring Provider:     Pulmonary Rehab from 05/30/2017 in [facility] Cardiac and Pulmonary Rehab  Referring Provider  Ancil Linsey MD      Encounter Date: 08/07/2017  Check In:     Session Check In - 08/07/17 0946      Check-In   Location [facility]-Cardiac & Pulmonary Rehab   Staff Present Carson Myrtle, BS, RRT, Respiratory Bertis Ruddy, BS, ACSM CEP, Exercise Physiologist;Rowan Pollman Flavia Shipper   Supervising physician immediately available to respond to emergencies [facility] immediately available ER MD   Physician(s) Dr. Cherylann Banas and Corky Downs   Medication changes reported     No   Fall or balance concerns reported    No   Warm-up and Cool-down Performed as group-led instruction   Resistance Training Performed Yes   VAD Patient? No     Pain Assessment   Currently in Pain? No/denies   Multiple Pain Sites No         History  Smoking Status  . Former Smoker  . Quit date: 04/06/2007  Smokeless Tobacco  . Never Used    Goals Met:  Proper associated with RPD/PD & O2 Sat Independence with exercise equipment Exercise tolerated well No report of cardiac concerns or symptoms Strength training completed today  Goals Unmet:  Not Applicable  Comments: Pt able to follow exercise prescription today without complaint.  Will continue to monitor for progression.    Dr. Emily Filbert is Medical Director for [place] and [facility].

## 2017-08-09 DIAGNOSIS — J449 Chronic obstructive pulmonary disease, unspecified: Secondary | ICD-10-CM

## 2017-08-09 DIAGNOSIS — I214 Non-ST elevation (NSTEMI) myocardial infarction: Secondary | ICD-10-CM | POA: Diagnosis not present

## 2017-08-09 NOTE — Progress Notes (Signed)
Daily Session Note  Patient Details  Name: Thomas Trujillo MRN: [id number] Date of Birth: 06-Jan-1946 Referring Provider:     Pulmonary Rehab from 05/30/2017 in [facility] Cardiac and Pulmonary Rehab  Referring Provider  Ancil Linsey MD      Encounter Date: 08/09/2017  Check In:     Session Check In - 08/09/17 0958      Check-In   Location [facility]-Cardiac & Pulmonary Rehab   Staff Present Nada Maclachlan, BA, ACSM CEP, Exercise Physiologist;Krista Frederico Hamman, RN BSN;Loyd Marhefka Flavia Shipper   Supervising physician immediately available to respond to emergencies [facility] immediately available ER MD   Physician(s) Dr. Mariea Clonts and Joni Fears   Medication changes reported     No   Fall or balance concerns reported    No   Warm-up and Cool-down Performed as group-led instruction   Resistance Training Performed Yes   VAD Patient? No     Pain Assessment   Currently in Pain? No/denies   Multiple Pain Sites No         History  Smoking Status  . Former Smoker  . Quit date: 04/06/2007  Smokeless Tobacco  . Never Used    Goals Met:  Independence with exercise equipment Exercise tolerated well No report of cardiac concerns or symptoms Strength training completed today  Goals Unmet:  Not Applicable  Comments: Pt able to follow exercise prescription today without complaint.  Will continue to monitor for progression.   Dr. Emily Filbert is Medical Director for [place] and [facility].

## 2017-08-11 DIAGNOSIS — I214 Non-ST elevation (NSTEMI) myocardial infarction: Secondary | ICD-10-CM | POA: Diagnosis not present

## 2017-08-11 DIAGNOSIS — J449 Chronic obstructive pulmonary disease, unspecified: Secondary | ICD-10-CM

## 2017-08-11 NOTE — Progress Notes (Signed)
Daily Session Note  Patient Details  Name: Thomas Trujillo MRN: [id number] Date of Birth: May 05, 1946 Referring Provider:     Pulmonary Rehab from 05/30/2017 in [facility] Cardiac and Pulmonary Rehab  Referring Provider  Ancil Linsey MD      Encounter Date: 08/11/2017  Check In:     Session Check In - 08/11/17 0957      Check-In   Location [facility]-Cardiac & Pulmonary Rehab   Staff Present Nada Maclachlan, BA, ACSM CEP, Exercise Physiologist;Carolyne Whitsel Alcus Dad, RN BSN   Supervising physician immediately available to respond to emergencies [facility] immediately available ER MD   Physician(s) Dr. Kerman Passey and Quentin Cornwall   Medication changes reported     No   Fall or balance concerns reported    No   Warm-up and Cool-down Performed as group-led instruction   Resistance Training Performed Yes   VAD Patient? No     Pain Assessment   Currently in Pain? No/denies   Multiple Pain Sites No         History  Smoking Status  . Former Smoker  . Quit date: 04/06/2007  Smokeless Tobacco  . Never Used    Goals Met:  Independence with exercise equipment Exercise tolerated well No report of cardiac concerns or symptoms Strength training completed today  Goals Unmet:  Not Applicable  Comments: Pt able to follow exercise prescription today without complaint.  Will continue to monitor for progression.   Dr. Emily Filbert is Medical Director for [place] and [facility].

## 2017-08-14 DIAGNOSIS — J449 Chronic obstructive pulmonary disease, unspecified: Secondary | ICD-10-CM

## 2017-08-14 DIAGNOSIS — I214 Non-ST elevation (NSTEMI) myocardial infarction: Secondary | ICD-10-CM | POA: Diagnosis not present

## 2017-08-14 NOTE — Progress Notes (Signed)
Daily Session Note  Patient Details  Name: Thomas Trujillo MRN: [id number] Date of Birth: [birth year] Referring Provider:     Pulmonary Rehab from 05/30/2017 in [facility] Cardiac and Pulmonary Rehab  Referring Provider  Ancil Linsey MD      Encounter Date: 08/14/2017  Check In:     Session Check In - 08/14/17 1021      Check-In   Location [facility]-Cardiac & Pulmonary Rehab   Staff Present Nada Maclachlan, BA, ACSM CEP, Exercise Physiologist;Kelly Amedeo Plenty, BS, ACSM CEP, Exercise Physiologist;Joseph Flavia Shipper   Supervising physician immediately available to respond to emergencies [facility] immediately available ER MD   Physician(s) Dr. Kerman Passey and Mariea Clonts   Medication changes reported     No   Fall or balance concerns reported    No   Warm-up and Cool-down Performed as group-led instruction   Resistance Training Performed Yes   VAD Patient? No     Pain Assessment   Currently in Pain? No/denies   Multiple Pain Sites No         History  Smoking Status  . Former Smoker  . Quit date: 04/06/2007  Smokeless Tobacco  . Never Used    Goals Met:  Independence with exercise equipment Exercise tolerated well No report of cardiac concerns or symptoms Strength training completed today  Goals Unmet:  Not Applicable  Comments: Pt able to follow exercise prescription today without complaint.  Will continue to monitor for progression.   Dr. Emily Filbert is Medical Director for [place] and [facility].

## 2017-08-14 NOTE — Progress Notes (Signed)
Pulmonary Individual Treatment Plan  Patient Details  Name: Thomas Trujillo MRN: [id number] Date of Birth: [birth year] Referring Provider:     Pulmonary Rehab from 05/30/2017 in [facility] Cardiac and Pulmonary Rehab  Referring Provider  Ancil Linsey MD      Initial Encounter Date:    Pulmonary Rehab from 05/30/2017 in [facility] Cardiac and Pulmonary Rehab  Date  05/30/17  Referring Provider  Ancil Linsey MD      Visit Diagnosis: Chronic obstructive pulmonary disease, unspecified COPD type ([place])  Patient's Home Medications on Admission:  Current Outpatient Prescriptions:  .  aspirin 325 MG EC tablet, Take 1 tablet (325 mg total) by mouth daily., Disp: 30 tablet, Rfl: 0 .  atorvastatin (LIPITOR) 40 MG tablet, Take 1 tablet (40 mg total) by mouth daily., Disp: 90 tablet, Rfl: 3 .  bisacodyl (DULCOLAX) 5 MG EC tablet, Take 2 tablets (10 mg total) by mouth daily as needed for moderate constipation (Give daily if no BM)., Disp: 30 tablet, Rfl: 0 .  budesonide (PULMICORT) 0.5 MG/2ML nebulizer solution, Take 0.5 mg by nebulization daily., Disp: , Rfl:  .  ferrous sulfate 325 (65 FE) MG tablet, Take 325 mg by mouth daily with breakfast., Disp: , Rfl:  .  furosemide (LASIX) 20 MG tablet, TAKE 1-2 TABS AS DIRECTED ONCE DAILY WITH ADDITIONAL DOSE IF GAINS 2 POUNDS IN A DAY OR 5 IN A WEEK, Disp: 60 tablet, Rfl: 3 .  ipratropium-albuterol (DUONEB) 0.5-2.5 (3) MG/3ML SOLN, Take 3 mLs by nebulization every 6 (six) hours as needed. When awake , Disp: , Rfl:  .  metoprolol tartrate (LOPRESSOR) 25 MG tablet, Take 12.5 mg by mouth 2 (two) times daily., Disp: , Rfl:  .  OXYGEN, Inhale into the lungs. By nasal cannula and wean as tolerated to keep O2 sats >90%, Disp: , Rfl:  .  potassium chloride (K-DUR) 10 MEQ tablet, Take 10 mEq by mouth daily., Disp: , Rfl:  .  tamsulosin (FLOMAX) 0.4 MG CAPS capsule, Take 1 capsule (0.4 mg total) by mouth daily., Disp: 30 capsule, Rfl: 1  Past Medical History: Past Medical  History:  Diagnosis Date  . Bleb, lung ([place])   . Coronary artery disease 02/14/2017   NSTEMI with urgent CABG (LIMA-LAD and SVG->ramus)  . Hyperlipidemia   . Ischemic cardiomyopathy   . Patient denies medical problems   . Pneumothorax 05/2017   Left    Tobacco Use: History  Smoking Status  . Former Smoker  . Quit date: 04/06/2007  Smokeless Tobacco  . Never Used    Labs: Recent Review Flowsheet Data    Labs for ITP Cardiac and Pulmonary Rehab Latest Ref Rng & Units 02/15/2017 02/15/2017 02/15/2017 02/18/2017 02/18/2017   Cholestrol 0 - 200 mg/dL - - - - -   LDLCALC 0 - 99 mg/dL - - - - -   HDL >40 mg/dL - - - - -   Trlycerides <150 mg/dL - - - - -   Hemoglobin A1c 4.8 - 5.6 % - - - - -   PHART 7.350 - 7.450 7.316(L) 7.344(L) - 7.393 -   PCO2ART 32.0 - 48.0 mmHg 46.8 42.7 - 50.1(H) -   HCO3 20.0 - 28.0 mmol/L 23.7 23.3 - 30.5(H) -   TCO2 0 - 100 mmol/L '25 25 30 ' 32 30   ACIDBASEDEF 0.0 - 2.0 mmol/L 2.0 2.0 - - -   O2SAT % 96.0 96.0 - 98.0 -       Pulmonary Assessment Scores:  Pulmonary Assessment Scores    Row Name 05/30/17 1128 07/21/17 1018       ADL UCSD   ADL Phase Entry Mid    SOB Score total 38 41    Rest 0 0    Walk 1 3    Stairs 4 4    Bath 1 1    Dress 1 0    Shop 2 1      CAT Score   CAT Score 22  -      mMRC Score   mMRC Score 1  -       Pulmonary Function Assessment:     Pulmonary Function Assessment - 05/30/17 1142      Pulmonary Function Tests   FVC% 108 %   FEV1% 39 %   FEV1/FVC Ratio 28   RV% 153 %   DLCO% 59 %     Breath   Bilateral Breath Sounds Clear   Shortness of Breath Yes;Limiting activity      Exercise Target Goals:    Exercise Program Goal: Individual exercise prescription set with THRR, safety & activity barriers. Participant demonstrates ability to understand and report RPE using BORG scale, to self-measure pulse accurately, and to acknowledge the importance of the exercise prescription.  Exercise  Prescription Goal: Starting with aerobic activity 30 plus minutes a day, 3 days per week for initial exercise prescription. Provide home exercise prescription and guidelines that participant acknowledges understanding prior to discharge.  Activity Barriers & Risk Stratification:     Activity Barriers & Cardiac Risk Stratification - 05/30/17 1247      Activity Barriers & Cardiac Risk Stratification   Activity Barriers Muscular Weakness;Shortness of Breath      6 Minute Walk:     6 Minute Walk    Row Name 05/30/17 1243         6 Minute Walk   Phase Initial     Distance 340 feet     Walk Time 1.75 minutes     # of Rest Breaks 2  2:11 and 2:06     MPH 2.25     METS 1.42     RPE 15     Perceived Dyspnea  4     VO2 Peak 4.98     Symptoms Yes (comment)     Comments SOB, fatigue, tight breathing, felt like coming down with cold.  Oxygen saturations dropped once seated both time.     Resting HR 88 bpm     Resting BP 134/70     Max Ex. HR 99 bpm     Max Ex. BP 150/68     2 Minute Post BP 126/64       Interval HR   Baseline HR (retired) 88     1 Minute HR 95     2 Minute HR 86     3 Minute HR 89     4 Minute HR 99     5 Minute HR 87     6 Minute HR 86     2 Minute Post HR 88     Interval Heart Rate? Yes       Interval Oxygen   Interval Oxygen? Yes     Baseline Oxygen Saturation % 91 %     Resting Liters of Oxygen 2 L     1 Minute Oxygen Saturation % 93 %     1 Minute Liters of Oxygen 2 L     2 Minute Oxygen Saturation %  84 %  seated rest from 0:48-2:59     2 Minute Liters of Oxygen 2 L     3 Minute Oxygen Saturation % 91 %     3 Minute Liters of Oxygen 2 L     4 Minute Oxygen Saturation % 92 %  seated stopped at 3:54     4 Minute Liters of Oxygen 2 L     5 Minute Oxygen Saturation % 85 %     5 Minute Liters of Oxygen 2 L     6 Minute Oxygen Saturation % 91 %     6 Minute Liters of Oxygen 2 L     2 Minute Post Oxygen Saturation % 95 %     2 Minute Post Liters  of Oxygen 2 L       Oxygen Initial Assessment:     Oxygen Initial Assessment - 05/30/17 1139      Home Oxygen   Home Oxygen Device Home Concentrator;E-Tanks   Sleep Oxygen Prescription None   Home Exercise Oxygen Prescription Continuous   Liters per minute 2   Home at Rest Exercise Oxygen Prescription None  unless short of breath   Compliance with Home Oxygen Use Yes     Program Oxygen Prescription   Program Oxygen Prescription Continuous   Liters per minute 2     Intervention   Short Term Goals To learn and exhibit compliance with exercise, home and travel O2 prescription;To learn and understand importance of monitoring SPO2 with pulse oximeter and demonstrate accurate use of the pulse oximeter.;To Learn and understand importance of maintaining oxygen saturations>88%;To learn and demonstrate proper purse lipped breathing techniques or other breathing techniques.;To learn and demonstrate proper use of respiratory medications   Long  Term Goals Exhibits compliance with exercise, home and travel O2 prescription;Maintenance of O2 saturations>88%;Compliance with respiratory medication;Demonstrates proper use of MDI's;Exhibits proper breathing techniques, such as purse lipped breathing or other method taught during program session;Verbalizes importance of monitoring SPO2 with pulse oximeter and return demonstration      Oxygen Re-Evaluation:     Oxygen Re-Evaluation    Row Name 07/05/17 1034 08/02/17 1103           Program Oxygen Prescription   Program Oxygen Prescription Continuous  o2 on treadmill and as needed Continuous  oxygen on treadmill as needed      Liters per minute 2 2        Home Oxygen   Home Oxygen Device Home Concentrator;E-Tanks Home Concentrator;E-Tanks      Sleep Oxygen Prescription None None      Home Exercise Oxygen Prescription Continuous Continuous      Liters per minute 2 2      Home at Rest Exercise Oxygen Prescription None None      Compliance  with Home Oxygen Use Yes Yes        Goals/Expected Outcomes   Short Term Goals To learn and exhibit compliance with exercise, home and travel O2 prescription;To learn and understand importance of monitoring SPO2 with pulse oximeter and demonstrate accurate use of the pulse oximeter.;To learn and understand importance of maintaining oxygen saturations>88%;To learn and demonstrate proper pursed lip breathing techniques or other breathing techniques.;To learn and demonstrate proper use of respiratory medications To learn and exhibit compliance with exercise, home and travel O2 prescription;To learn and understand importance of maintaining oxygen saturations>88%;To learn and demonstrate proper use of respiratory medications;To learn and demonstrate proper pursed lip breathing techniques or other breathing techniques.;To learn  and understand importance of monitoring SPO2 with pulse oximeter and demonstrate accurate use of the pulse oximeter.      Long  Term Goals Exhibits compliance with exercise, home and travel O2 prescription;Verbalizes importance of monitoring SPO2 with pulse oximeter and return demonstration;Maintenance of O2 saturations>88%;Exhibits proper breathing techniques, such as pursed lip breathing or other method taught during program session;Compliance with respiratory medication;Demonstrates proper use of MDI's Verbalizes importance of monitoring SPO2 with pulse oximeter and return demonstration;Exhibits proper breathing techniques, such as pursed lip breathing or other method taught during program session;Demonstrates proper use of MDI's;Compliance with respiratory medication;Maintenance of O2 saturations>88%;Exhibits compliance with exercise, home and travel O2 prescription      Comments Thomas Trujillo has done well to work on PLB to increase his oxygen before he puts on his cannula. He is taking his nebulizers at home four times a day as prescribed. Thomas Trujillo is proficient at checking his blood pressure and  oxygen saturations at home. He uses his oxygen at home if he does something strenuous but only for a few minutes. Thomas Trujillo has his own pulse oximeter and checks his oxygen at home. He verbalizes understanding that he should be 88 percent or above. He takes his albuterol and Pulmicort nebulizer 4 times a day. His PLB skills have been getting better.      Goals/Expected Outcomes Short: work on PLB, maintain oxygen above 88%. Long Be proficient in PLB, use less oxygen with exercise Short: continue to manage his oxygen at home. Long: Maintain his COPD at home and exercise independently.         Oxygen Discharge (Final Oxygen Re-Evaluation):     Oxygen Re-Evaluation - 08/02/17 1103      Program Oxygen Prescription   Program Oxygen Prescription Continuous  oxygen on treadmill as needed   Liters per minute 2     Home Oxygen   Home Oxygen Device Home Concentrator;E-Tanks   Sleep Oxygen Prescription None   Home Exercise Oxygen Prescription Continuous   Liters per minute 2   Home at Rest Exercise Oxygen Prescription None   Compliance with Home Oxygen Use Yes     Goals/Expected Outcomes   Short Term Goals To learn and exhibit compliance with exercise, home and travel O2 prescription;To learn and understand importance of maintaining oxygen saturations>88%;To learn and demonstrate proper use of respiratory medications;To learn and demonstrate proper pursed lip breathing techniques or other breathing techniques.;To learn and understand importance of monitoring SPO2 with pulse oximeter and demonstrate accurate use of the pulse oximeter.   Long  Term Goals Verbalizes importance of monitoring SPO2 with pulse oximeter and return demonstration;Exhibits proper breathing techniques, such as pursed lip breathing or other method taught during program session;Demonstrates proper use of MDI's;Compliance with respiratory medication;Maintenance of O2 saturations>88%;Exhibits compliance with exercise, home and travel O2  prescription   Comments Thomas Trujillo has his own pulse oximeter and checks his oxygen at home. He verbalizes understanding that he should be 88 percent or above. He takes his albuterol and Pulmicort nebulizer 4 times a day. His PLB skills have been getting better.   Goals/Expected Outcomes Short: continue to manage his oxygen at home. Long: Maintain his COPD at home and exercise independently.      Initial Exercise Prescription:     Initial Exercise Prescription - 05/30/17 1200      Date of Initial Exercise RX and Referring Provider   Date 05/30/17   Referring Provider Ancil Linsey MD     Oxygen   Oxygen Continuous  Primarily  on treadmill   Liters 2     Treadmill   MPH 2   Grade 1   Minutes 15   METs 2.81     NuStep   Level 4   SPM 80   Minutes 15   METs 2.8     Arm Ergometer   Level 3   RPM 35   Minutes 15   METs 2.8     Resistance Training   Training Prescription Yes   Weight 3 lb   Reps 10-15      Perform Capillary Blood Glucose checks as needed.  Exercise Prescription Changes:     Exercise Prescription Changes    Row Name 05/30/17 1200 06/21/17 1400 07/05/17 1400 07/19/17 1500 08/02/17 1400     Response to Exercise   Blood Pressure (Admit) 134/70 110/58 126/62 128/60 124/64   Blood Pressure (Exercise) 150/68  -  -  -  -   Blood Pressure (Exit) 126/64 106/58 114/60 98/50 126/60   Heart Rate (Admit) 88 bpm 92 bpm 111 bpm 96 bpm 94 bpm   Heart Rate (Exercise) 99 bpm 92 bpm 118 bpm 119 bpm 116 bpm   Heart Rate (Exit) 88 bpm 92 bpm 107 bpm 111 bpm 110 bpm   Rating of Perceived Exertion (Exercise) '15 15 13 13 13   ' Perceived Dyspnea (Exercise) '4 3 2 3 3   ' Symptoms SOB none none none none   Comments walk test results  -  -  -  -   Duration  - Continue with 45 min of aerobic exercise without signs/symptoms of physical distress. Continue with 45 min of aerobic exercise without signs/symptoms of physical distress. Continue with 45 min of aerobic exercise without  signs/symptoms of physical distress. Continue with 45 min of aerobic exercise without signs/symptoms of physical distress.   Intensity  - THRR unchanged THRR unchanged THRR unchanged THRR unchanged     Progression   Progression  - Continue to progress workloads to maintain intensity without signs/symptoms of physical distress. Continue to progress workloads to maintain intensity without signs/symptoms of physical distress. Continue to progress workloads to maintain intensity without signs/symptoms of physical distress. Continue to progress workloads to maintain intensity without signs/symptoms of physical distress.   Average METs  - 2.65 2.25 2.6 2.65     Resistance Training   Training Prescription  - Yes Yes Yes Yes   Weight  - 3 lb 3 lb 3 lb 3 lb   Reps  - 10-15 10-15 10-15 10-15     Interval Training   Interval Training  - No No No  -     Oxygen   Oxygen  - Continuous Continuous Continuous  -   Liters  - 2  TM only  2  treadmill only 2  TM only  -     Treadmill   MPH  - 2.5  - 2.5 2.5   Grade  - 1  - 1 1   Minutes  - 15  - 15 15   METs  - 3.26  - 3.26 3.26     NuStep   Level  - '5 5 5 5   ' SPM  - 71 70 60 67   Minutes  - '15 15 15 15   ' METs  - 3.1 2.7 2.5 2.6     Arm Ergometer   Level  - 1.1 1.'8 2 3   ' RPM  - 39  -  -  -   Minutes  -  '15 15 15 15   ' METs  - 1.6 1.8 2 2.1      Exercise Comments:     Exercise Comments    Row Name 06/09/17 1103 06/21/17 1129         Exercise Comments First full day of exercise!  Patient was oriented to gym and equipment including functions, settings, policies, and procedures.  Patient's individual exercise prescription and treatment plan were reviewed.  All starting workloads were established based on the results of the 6 minute walk test done at initial orientation visit.  The plan for exercise progression was also introduced and progression will be customized based on patient's performance and goals. Reviewed home exercise with pt today.   Pt plans to walk for exercise.  Reviewed THR, pulse, RPE, sign and symptoms, NTG use, and when to call 911 or MD.  Also discussed weather considerations and indoor options.  Pt voiced understanding         Exercise Goals and Review:     Exercise Goals    Row Name 05/30/17 1251             Exercise Goals   Increase Physical Activity Yes       Intervention Provide advice, education, support and counseling about physical activity/exercise needs.;Develop an individualized exercise prescription for aerobic and resistive training based on initial evaluation findings, risk stratification, comorbidities and participant's personal goals.       Expected Outcomes Achievement of increased cardiorespiratory fitness and enhanced flexibility, muscular endurance and strength shown through measurements of functional capacity and personal statement of participant.       Increase Strength and Stamina Yes       Intervention Provide advice, education, support and counseling about physical activity/exercise needs.;Develop an individualized exercise prescription for aerobic and resistive training based on initial evaluation findings, risk stratification, comorbidities and participant's personal goals.       Expected Outcomes Achievement of increased cardiorespiratory fitness and enhanced flexibility, muscular endurance and strength shown through measurements of functional capacity and personal statement of participant.          Exercise Goals Re-Evaluation :     Exercise Goals Re-Evaluation    Row Name 06/21/17 1128 06/21/17 1406 07/05/17 1451 07/19/17 1519 08/02/17 1410     Exercise Goal Re-Evaluation   Exercise Goals Review Increase Physical Activity;Increase Strength and Stamina Increase Physical Activity;Increase Strength and Stamina;Able to understand and use rate of perceived exertion (RPE) scale;Able to understand and use Dyspnea scale;Knowledge and understanding of Target Heart Rate Range  (THRR);Understanding of Exercise Prescription;Able to check pulse independently Increase Physical Activity;Increase Strength and Stamina Increase Physical Activity;Increase Strength and Stamina Increase Physical Activity;Increase Strength and Stamina;Able to understand and use rate of perceived exertion (RPE) scale;Able to understand and use Dyspnea scale   Comments Thomas Trujillo has been walking 10 min a day at home.  We talked about lengthening the time to 20 - 30 min or doing two 10 min sessions per day. Home exercise was reviewed with Jenny Reichmann including RPE, THR and safety.  He is walking 10 min at a time at home. [place] is tolerating exercise well.  He is working to increase intensity on the arm crank. Thomas Trujillo continues to work at correct RPE levels.  He is motivated to improve fitness. Thomas Trujillo is maintaining his MET level.  He attends regularly and has reduced the need for O2 on the NS and arm ergometer.   Expected Outcomes Short - [place] will increase walk time to 20 min  at a time.  Long - Oda will continue to complete 30 min of exercise at home on days he doesnt attend class. Short - Jasir will increase walks to 20-30 minutes.  Long - Oskar will continue to exercise on his own  Short - [place]tt will see improvement on the arm crank.  Long - Thomas Trujillo wil maintain reguar exercise. SHort - Thomas Trujillo will continue to attend.  Long - Thomas Trujillo will maintain fitness on his own. Short - Thomas Trujillo will continue to attend LW.  Long - Thomas Trujillo will maintain exercise on his own.      Discharge Exercise Prescription (Final Exercise Prescription Changes):     Exercise Prescription Changes - 08/02/17 1400      Response to Exercise   Blood Pressure (Admit) 124/64   Blood Pressure (Exit) 126/60   Heart Rate (Admit) 94 bpm   Heart Rate (Exercise) 116 bpm   Heart Rate (Exit) 110 bpm   Rating of Perceived Exertion (Exercise) 13   Perceived Dyspnea (Exercise) 3   Symptoms none   Duration Continue with 45 min of aerobic exercise without signs/symptoms of  physical distress.   Intensity THRR unchanged     Progression   Progression Continue to progress workloads to maintain intensity without signs/symptoms of physical distress.   Average METs 2.65     Resistance Training   Training Prescription Yes   Weight 3 lb   Reps 10-15     Treadmill   MPH 2.5   Grade 1   Minutes 15   METs 3.26     NuStep   Level 5   SPM 67   Minutes 15   METs 2.6     Arm Ergometer   Level 3   Minutes 15   METs 2.1      Nutrition:  Target Goals: Understanding of nutrition guidelines, daily intake of sodium <1515m, cholesterol <2071m calories 30% from fat and 7% or less from saturated fats, daily to have 5 or more servings of fruits and vegetables.  Biometrics:     Pre Biometrics - 05/30/17 1251      Pre Biometrics   Height 5' 4.6" (1.641 m)   Weight 150 lb 12.8 oz (68.4 kg)   Waist Circumference 35.5 inches   Hip Circumference 37 inches   Waist to Hip Ratio 0.96 %   BMI (Calculated) 25.5       Nutrition Therapy Plan and Nutrition Goals:     Nutrition Therapy & Goals - 08/04/17 1433      Nutrition Therapy   RD appointment defered Yes      Nutrition Discharge: Rate Your Plate Scores:   Nutrition Goals Re-Evaluation:     Nutrition Goals Re-Evaluation    [place] 07/05/17 1042 08/04/17 1433           Goals   Current Weight 149 lb (67.6 kg) 151 lb (68.5 kg)      Nutrition Goal Continue with current heart healthy eating pattern to maintain his weight. Continue to eat healthy to maintain his current weight      Comment Blain's chef is still cooking for him daily. He follows a heart healthy list of foods. He will continue to cook for him until the end of the year. Hashim's chef cooking for him daily. He follows a heart healthy list of foods. He will continue to cook for him until the end of the year.      Expected Outcome Short: Continue to eat a heart healthy diet.  Long: Continue to ear a heart healthy diet. Short: Continue to eat  a heart healthy diet. Long: Continue to ear a heart healthy diet.         Nutrition Goals Discharge (Final Nutrition Goals Re-Evaluation):     Nutrition Goals Re-Evaluation - 08/04/17 1433      Goals   Current Weight 151 lb (68.5 kg)   Nutrition Goal Continue to eat healthy to maintain his current weight   Comment Thomas Trujillo's chef cooking for him daily. He follows a heart healthy list of foods. He will continue to cook for him until the end of the year.   Expected Outcome Short: Continue to eat a heart healthy diet. Long: Continue to ear a heart healthy diet.      Psychosocial: Target Goals: Acknowledge presence or absence of significant depression and/or stress, maximize coping skills, provide positive support system. Participant is able to verbalize types and ability to use techniques and skills needed for reducing stress and depression.   Initial Review & Psychosocial Screening:     Initial Psych Review & Screening - 05/30/17 1139      Initial Review   Current issues with Current Sleep Concerns     Family Dynamics   Good Support System? Yes      Quality of Life Scores:   PHQ-9: Recent Review Flowsheet Data    Depression screen [facility] 2/9 07/26/2017 05/30/2017 05/22/2017 03/30/2017   Decreased Interest 0 0 0 2   Down, Depressed, Hopeless 0 0 0 0   PHQ - 2 Score 0 0 0 2   Altered sleeping 0 1 0 3    Tired, decreased energy 1 0 0 3    Change in appetite 0 0 0 1    Feeling bad or failure about yourself  0 0 0 0   Trouble concentrating 0 0 0 0   Moving slowly or fidgety/restless 0 0 0 0   Suicidal thoughts 0 0 0 0   PHQ-9 Score 1 1 0 9   Difficult doing work/chores Not difficult at all Not difficult at all Not difficult at all Not difficult at all     Interpretation of Total Score  Total Score Depression Severity:  1-4 = Minimal depression, 5-9 = Mild depression, 10-14 = Moderate depression, 15-19 = Moderately severe depression, 20-27 = Severe depression   Psychosocial  Evaluation and Intervention:     Psychosocial Evaluation - 06/28/17 1126      Psychosocial Evaluation & Interventions   Comments Follow up with Jenny Reichmann today reporting progress made since coming into this program.  He is using less Oxygen and has increased his intensity on the machines - so increased stamina is being experienced as well.  Thomas Trujillo states his appetite is good and he continues to sleep better.  He continues to rely on his support system and have minimal stress in his life.  Counselor commended Thomas Trujillo on his progress made.   Expected Outcomes Aashir will continue to exercise consistently to maintain his progress and increase his stamina and strength.      Psychosocial Re-Evaluation:     Psychosocial Re-Evaluation    [place] Name 07/05/17 1047 08/07/17 1702           Psychosocial Re-Evaluation   Current issues with None Identified None Identified;Current Stress Concerns      Comments Thomas Trujillo sleep has been better and averages 7 hours of sleep a night. Thomas Trujillo feels like he is more independent since the [employer] and [facility]  program. He is driving himself to the program currently. Thomas Trujillo stated that his breathing has not improved much since starting the program. Informed him that he may need a change in his respiratory medication and to inform his Doctor. Thomas Trujillo has COPD and heart issues and wants to make sure he can do whatever he can to imporove his breathing to decrease stress.      Expected Outcomes Short: continue to keep a healthy sleep regimen. Long:Keep resting well to maintain a stress free life. Short: continue pulmonary rehab to reduce stress. Long: work independently to keep stress to a minimum      Interventions Encouraged to attend Pulmonary Rehabilitation for the exercise;Physician referral Encouraged to attend Pulmonary Rehabilitation for the exercise      Continue Psychosocial Services  Follow up required by staff Follow up required by staff         Psychosocial Discharge  (Final Psychosocial Re-Evaluation):     Psychosocial Re-Evaluation - 08/07/17 1702      Psychosocial Re-Evaluation   Current issues with None Identified;Current Stress Concerns   Comments Thomas Trujillo stated that his breathing has not improved much since starting the program. Informed him that he may need a change in his respiratory medication and to inform his Doctor. Thomas Trujillo has COPD and heart issues and wants to make sure he can do whatever he can to imporove his breathing to decrease stress.   Expected Outcomes Short: continue pulmonary rehab to reduce stress. Long: work independently to keep stress to a minimum   Interventions Encouraged to attend Pulmonary Rehabilitation for the exercise   Continue Psychosocial Services  Follow up required by staff      Education: Education Goals: Education classes will be provided on a weekly basis, covering required topics. Participant will state understanding/return demonstration of topics presented.  Learning Barriers/Preferences:   Education Topics: Initial Evaluation Education: - Verbal, written and demonstration of respiratory meds, RPE/PD scales, oximetry and breathing techniques. Instruction on use of nebulizers and MDIs: cleaning and proper use, rinsing mouth with steroid doses and importance of monitoring MDI activations.   Pulmonary Rehab from 06/14/2017 in [facility] Cardiac and Pulmonary Rehab  Date  05/30/17  Educator  [facility]  Instruction Review Code (retired)  2- meets goals/outcomes      General Nutrition Guidelines/Fats and Fiber: -Group instruction provided by verbal, written material, models and posters to present the general guidelines for heart healthy nutrition. Gives an explanation and review of dietary fats and fiber.   Pulmonary Rehab from 08/09/2017 in [facility] Cardiac and Pulmonary Rehab  Date  08/07/17  Educator  CR  Instruction Review Code  5- Refused Teaching [already taken]      Controlling Sodium/Reading Food Labels: -Group verbal  and written material supporting the discussion of sodium use in heart healthy nutrition. Review and explanation with models, verbal and written materials for utilization of the food label.   Pulmonary Rehab from 08/09/2017 in [facility] Cardiac and Pulmonary Rehab  Instruction Review Code  1- Verbalizes Understanding      Exercise Physiology & Risk Factors: - Group verbal and written instruction with models to review the exercise physiology of the cardiovascular system and associated critical values. Details cardiovascular disease risk factors and the goals associated with each risk factor.   Pulmonary Rehab from 08/09/2017 in [facility] Cardiac and Pulmonary Rehab  Date  07/28/17  Educator  Nada Maclachlan, EP  Instruction Review Code  1- Verbalizes Understanding      Aerobic Exercise & Resistance Training: -  Gives group verbal and written discussion on the health impact of inactivity. On the components of aerobic and resistive training programs and the benefits of this training and how to safely progress through these programs.   Cardiac Rehab from 05/29/2017 in [facility] Cardiac and Pulmonary Rehab  Date  05/03/17  Educator  SB  Instruction Review Code (retired)  2- Statistician, Balance, General Exercise Guidelines: - Provides group verbal and written instruction on the benefits of flexibility and balance training programs. Provides general exercise guidelines with specific guidelines to those with heart or lung disease. Demonstration and skill practice provided.   Cardiac Rehab from 05/29/2017 in [facility] Cardiac and Pulmonary Rehab  Date  05/08/17  Educator  [facility]  Instruction Review Code (retired)  2- meets goals/outcomes      Stress Management: - Provides group verbal and written instruction about the health risks of elevated stress, cause of high stress, and healthy ways to reduce stress.   Cardiac Rehab from 05/29/2017 in [facility] Cardiac and Pulmonary Rehab  Date  05/17/17   Educator  [facility]  Instruction Review Code (retired)  2- meets goals/outcomes      Depression: - Provides group verbal and written instruction on the correlation between heart/lung disease and depressed mood, treatment options, and the stigmas associated with seeking treatment.   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   Pulmonary Rehab from 06/14/2017 in [facility] Cardiac and Pulmonary Rehab  Date  05/30/17  Educator  [facility]  Instruction Review Code (retired)  2- meets goals/outcomes      Infection Prevention: - Provides verbal and written material to individual with discussion of infection control including proper hand washing and proper equipment cleaning during exercise session.   Pulmonary Rehab from 06/14/2017 in [facility] Cardiac and Pulmonary Rehab  Date  05/30/17  Educator  [facility]  Instruction Review Code (retired)  2- meets [employer] Prevention: - Provides verbal and written material to individual with discussion of falls prevention and safety.   Pulmonary Rehab from 06/14/2017 in [facility] Cardiac and Pulmonary Rehab  Date  05/30/17  Educator  [facility]  Instruction Review Code (retired)  2- meets goals/outcomes      Diabetes: - Individual verbal and written instruction to review signs/symptoms of diabetes, desired ranges of glucose level fasting, after meals and with exercise. Advice that pre and post exercise glucose checks will be done for 3 sessions at entry of program.   Chronic Lung Diseases: - Group verbal and written instruction to review new updates, new respiratory medications, new advancements in procedures and treatments. Provide informative websites and "800" numbers of self-education.   Pulmonary Rehab from 08/09/2017 in [facility]  Cardiac and Pulmonary Rehab  Date  07/26/17  Educator  [facility]  Instruction Review Code  1- Verbalizes Understanding      Lung Procedures: - Group verbal and written instruction to describe testing methods done to diagnose lung disease. Review the outcome of test results. Describe the treatment choices: Pulmonary Function Tests, ABGs and oximetry.   Energy Conservation: - Provide group verbal and written instruction for methods to conserve energy, plan and organize activities. Instruct on pacing techniques, use of adaptive equipment and posture/positioning to relieve shortness of breath.  Pulmonary Rehab from 08/09/2017 in [facility] Cardiac and Pulmonary Rehab  Date  07/05/17  Educator  [facility]  Instruction Review Code  1- Verbalizes Understanding      Triggers: - Group verbal and written instruction to review types of environmental controls: home humidity, furnaces, filters, dust mite/pet prevention, HEPA vacuums. To discuss weather changes, air quality and the benefits of nasal washing.   Exacerbations: - Group verbal and written instruction to provide: warning signs, infection symptoms, calling MD promptly, preventive modes, and value of vaccinations. Review: effective airway clearance, coughing and/or vibration techniques. Create an Sports administrator.   Oxygen: - Individual and group verbal and written instruction on oxygen therapy. Includes supplement oxygen, available portable oxygen systems, continuous and intermittent flow rates, oxygen safety, concentrators, and Medicare reimbursement for oxygen.   Respiratory Medications: - Group verbal and written instruction to review medications for lung disease. Drug class, frequency, complications, importance of spacers, rinsing mouth after steroid MDI's, and proper cleaning methods for nebulizers.   AED/CPR: - Group verbal and written instruction with the use of models to demonstrate the basic use of the AED with the basic ABC's of resuscitation.    Pulmonary Rehab from 08/09/2017 in [facility] Cardiac and Pulmonary Rehab  Date  07/14/17  Educator  CE  Instruction Review Code  1- Verbalizes Understanding      Breathing Retraining: - Provides individuals verbal and written instruction on purpose, frequency, and proper technique of diaphragmatic breathing and pursed-lipped breathing. Applies individual practice skills.   Pulmonary Rehab from 06/14/2017 in [facility] Cardiac and Pulmonary Rehab  Date  05/30/17  Educator  [facility]  Instruction Review Code (retired)  2- Lawyer and Physiology of the Lungs: - Group verbal and written instruction with the use of models to provide basic lung anatomy and physiology related to function, structure and complications of lung disease.   Pulmonary Rehab from 08/09/2017 in [facility] Cardiac and Pulmonary Rehab  Date  06/28/17  Educator  [facility]  Instruction Review Code  1- Verbalizes Understanding      Anatomy & Physiology of the Heart: - Group verbal and written instruction and models provide basic cardiac anatomy and physiology, with the coronary electrical and arterial systems. Review of: AMI, Angina, Valve disease, Heart Failure, Cardiac Arrhythmia, Pacemakers, and the ICD.   Cardiac Rehab from 05/29/2017 in [facility] Cardiac and Pulmonary Rehab  Date  05/15/17  Educator  CE  Instruction Review Code (retired)  2- meets goals/outcomes      Heart Failure: - Group verbal and written instruction on the basics of heart failure: signs/symptoms, treatments, explanation of ejection fraction, enlarged heart and cardiomyopathy.   Sleep Apnea: - Individual verbal and written instruction to review Obstructive Sleep Apnea. Review of risk factors, methods for diagnosing and types of masks and machines for OSA.   Anxiety: - Provides group, verbal and written instruction on the correlation between heart/lung disease and anxiety, treatment options, and management of anxiety.   Relaxation: - Provides  group, verbal and written instruction about the benefits of relaxation for patients with heart/lung disease. Also provides patients with examples of relaxation techniques.   Pulmonary Rehab from 08/09/2017 in [facility] Cardiac and Pulmonary Rehab  Date  08/09/17  Educator  [facility]  Instruction Review Code  1- Verbalizes Understanding      Cardiac Medications: - Group verbal and written instruction to review commonly prescribed medications for heart disease. Reviews the medication, class of the drug, and side effects.   Cardiac  Rehab from 05/29/2017 in [facility] Cardiac and Pulmonary Rehab  Date  05/29/17 [05/29/17 Part 1]  Educator  CE  Instruction Review Code (retired)  2- meets goals/outcomes      Know Your Numbers: -Group verbal and written instruction about important numbers in your health.  Review of Cholesterol, Blood Pressure, Diabetes, and BMI and the role they play in your overall health.   Pulmonary Rehab from 08/09/2017 in [facility] Cardiac and Pulmonary Rehab  Date  08/04/17  Educator  [facility]  Instruction Review Code  1- Verbalizes Understanding      Other: -Provides group and verbal instruction on various topics (see comments)    Knowledge Questionnaire Score:     Knowledge Questionnaire Score - 05/30/17 1134      Knowledge Questionnaire Score   Pre Score 9/10       Core Components/Risk Factors/Patient Goals at Admission:     Personal Goals and Risk Factors at Admission - 05/30/17 1126      Core Components/Risk Factors/Patient Goals on Admission    Weight Management Yes   Intervention Weight Management: Develop a combined nutrition and exercise program designed to reach desired caloric intake, while maintaining appropriate intake of nutrient and fiber, sodium and fats, and appropriate energy expenditure required for the weight goal.;Weight Management: Provide education and appropriate resources to help participant work on and attain dietary goals.;Weight Management/Obesity: Establish  reasonable short term and long term weight goals.   Admit Weight 150 lb (68 kg)   Goal Weight: Short Term 150 lb (68 kg)   Goal Weight: Long Term 150 lb (68 kg)  wants to maintain his current weight   Expected Outcomes Short Term: Continue to assess and modify interventions until short term weight is achieved;Long Term: Adherence to nutrition and physical activity/exercise program aimed toward attainment of established weight goal;Weight Maintenance: Understanding of the daily nutrition guidelines, which includes 25-35% calories from fat, 7% or less cal from saturated fats, less than 270m cholesterol, less than 1.5gm of sodium, & 5 or more servings of fruits and vegetables daily;Understanding recommendations for meals to include 15-35% energy as protein, 25-35% energy from fat, 35-60% energy from carbohydrates, less than 2036mof dietary cholesterol, 20-35 gm of total fiber daily;Understanding of distribution of calorie intake throughout the day with the consumption of 4-5 meals/snacks   Improve shortness of breath with ADL's Yes   Intervention Provide education, individualized exercise plan and daily activity instruction to help decrease symptoms of SOB with activities of daily living.   Expected Outcomes Short Term: Achieves a reduction of symptoms when performing activities of daily living.   Develop more efficient breathing techniques such as purse lipped breathing and diaphragmatic breathing; and practicing self-pacing with activity Yes   Intervention Provide education, demonstration and support about specific breathing techniuqes utilized for more efficient breathing. Include techniques such as pursed lipped breathing, diaphragmatic breathing and self-pacing activity.   Expected Outcomes Short Term: Participant will be able to demonstrate and use breathing techniques as needed throughout daily activities.   Increase knowledge of respiratory medications and ability to use respiratory devices  properly  Yes   Intervention Provide education and demonstration as needed of appropriate use of medications, inhalers, and oxygen therapy.   Expected Outcomes Short Term: Achieves understanding of medications use. Understands that oxygen is a medication prescribed by physician. Demonstrates appropriate use of inhaler and oxygen therapy.   Heart Failure Yes   Intervention Provide a combined exercise and nutrition program that is supplemented with education, support  and counseling about heart failure. Directed toward relieving symptoms such as shortness of breath, decreased exercise tolerance, and extremity edema.   Expected Outcomes Improve functional capacity of life;Short term: Attendance in program 2-3 days a week with increased exercise capacity. Reported lower sodium intake. Reported increased fruit and vegetable intake. Reports medication compliance.;Short term: Daily weights obtained and reported for increase. Utilizing diuretic protocols set by physician.;Long term: Adoption of self-care skills and reduction of barriers for early signs and symptoms recognition and intervention leading to self-care maintenance.   Lipids Yes   Intervention Provide education and support for participant on nutrition & aerobic/resistive exercise along with prescribed medications to achieve LDL <27m, HDL >487m   Expected Outcomes Short Term: Participant states understanding of desired cholesterol values and is compliant with medications prescribed. Participant is following exercise prescription and nutrition guidelines.;Long Term: Cholesterol controlled with medications as prescribed, with individualized exercise RX and with personalized nutrition plan. Value goals: LDL < 7079mHDL > 40 mg.   Personal Goal Other Yes   Personal Goal Increase upper body strength   Expected Outcomes Short: start pulmonary rehab. Long: To have increased upper body strength to increase his ADL      Core Components/Risk Factors/Patient  Goals Review:      Goals and Risk Factor Review    Row Name 07/05/17 1027 08/07/17 1655           Core Components/Risk Factors/Patient Goals Review   Personal Goals Review Weight Management/Obesity;Improve shortness of breath with ADL's;Lipids Weight Management/Obesity;Improve shortness of breath with ADL's;Lipids      Review Thomas Trujillo his lipids have been good. No medication changes. His weight has been maintained since the start of the program around 150lbs. Shortness of breath has improved slightly. Thomas Trujillo has maintained a healthy weight since the beginning of [facility]. He states his shortness of breath seems to be the same and sometimes worse.       Expected Outcomes Short: work on increaseing levels with machines to improve shortness of breath. Long: Increase levels on all machines.  Short: Improve ADL'S with medication or exercise changes. Long: Maintain ADL's independently         Core Components/Risk Factors/Patient Goals at Discharge (Final Review):      Goals and Risk Factor Review - 08/07/17 1655      Core Components/Risk Factors/Patient Goals Review   Personal Goals Review Weight Management/Obesity;Improve shortness of breath with ADL's;Lipids   Review JohSilvianos maintained a healthy weight since the beginning of [facility]. He states his shortness of breath seems to be the same and sometimes worse.    Expected Outcomes Short: Improve ADL'S with medication or exercise changes. Long: Maintain ADL's independently      ITP Comments:     ITP Comments    Row Name 05/30/17 1244 06/19/17 0825 06/30/17 1054 07/03/17 1237 07/05/17 1051   ITP Comments Met with Dr. MilSabra Heckrector of [facility] face to face. Chart reviewed and signed. Initial ITP sent. Diagnosis can be found in [facility] encounter 05/30/2017 30 day review completed. ITP sent to Dr. MarEmily Filbertrector of [facility] with ITP unless changes are made by physician.   Thomas Trujillo did not show today and missed face to face  meeting with Dr. MilSabra Heck We will take him to meet with Dr. MilSabra Heck next visit.  Sal met with Doctor MarEmily Filbertrector of [facility] face to face. Chart signed and reviewed. Tyren feels like he is more independent [facility] and [facility] program. He is driving himself to the program currently.   [place] Name 07/17/17 0829 07/31/17 1151 08/04/17 1145 08/14/17 0821     ITP Comments 30 day review completed. ITP sent to Dr. Emily Filbert Director of [place]. Continue with ITP unless changes are made by physician.   Dr. Caryl Comes signed for Dr. Emily Filbert director of [place] for face to face, review and changes. Kate has started intervals today. 30 day review completed. ITP sent to Dr. Emily Filbert Director of [place]. Continue with ITP unless changes are made by physician.         Comments: 30 day review

## 2017-08-16 DIAGNOSIS — J449 Chronic obstructive pulmonary disease, unspecified: Secondary | ICD-10-CM

## 2017-08-16 DIAGNOSIS — I214 Non-ST elevation (NSTEMI) myocardial infarction: Secondary | ICD-10-CM | POA: Diagnosis not present

## 2017-08-16 NOTE — Progress Notes (Signed)
Daily Session Note  Patient Details  Name: Thomas Trujillo MRN: [id number] Date of Birth: 01/09/1946 Referring Provider:     Pulmonary Rehab from 05/30/2017 in [facility] Cardiac and Pulmonary Rehab  Referring Provider  Fleming, [place] MD      Encounter Date: 08/16/2017  Check In:     Session Check In - 08/16/17 1003      Check-In   Location [facility]-Cardiac & Pulmonary Rehab   Staff Present Jessica Hawkins, MA, ACSM RCEP, Exercise Physiologist;Amanda Sommer, BA, ACSM CEP, Exercise Physiologist;Joseph Hood RCP,RRT,BSRT   Supervising physician immediately available to respond to emergencies [facility] immediately available ER MD   Physician(s) Drs. Willimas and Schaevitz   Medication changes reported     No   Fall or balance concerns reported    No   Warm-up and Cool-down Performed as group-led instruction   Resistance Training Performed Yes   VAD Patient? No     Pain Assessment   Currently in Pain? No/denies         History  Smoking Status  . Former Smoker  . Quit date: 04/06/2007  Smokeless Tobacco  . Never Used    Goals Met:  Proper associated with RPD/PD & O2 Sat Independence with exercise equipment Using PLB without cueing & demonstrates good technique Exercise tolerated well No report of cardiac concerns or symptoms Strength training completed today  Goals Unmet:  Not Applicable  Comments: Pt able to follow exercise prescription today without complaint.  Will continue to monitor for progression.     Dr. Mark Miller is Medical Director for [facility] and [facility]monary [facility]. 

## 2017-08-18 DIAGNOSIS — J449 Chronic obstructive pulmonary disease, unspecified: Secondary | ICD-10-CM

## 2017-08-18 DIAGNOSIS — I214 Non-ST elevation (NSTEMI) myocardial infarction: Secondary | ICD-10-CM | POA: Diagnosis not present

## 2017-08-18 NOTE — Progress Notes (Signed)
Daily Session Note  Patient Details  Name: Bexley Mclester MRN: [id number] Date of Birth: 1946-05-08 Referring Provider:     Pulmonary Rehab from 05/30/2017 in [facility] Cardiac and Pulmonary Rehab  Referring Provider  Ancil Linsey MD      Encounter Date: 08/18/2017  Check In:     Session Check In - 08/18/17 0956      Check-In   Location [facility]-Cardiac & Pulmonary Rehab   Staff Present Nada Maclachlan, BA, ACSM CEP, Exercise Physiologist;Kirsty Monjaraz Alcus Dad, RN BSN   Supervising physician immediately available to respond to emergencies [facility] immediately available ER MD   Physician(s) Dr. Alfred Levins and [place]   Medication changes reported     No   Fall or balance concerns reported    No   Warm-up and Cool-down Performed as group-led instruction   Resistance Training Performed Yes   VAD Patient? No     Pain Assessment   Currently in Pain? No/denies         History  Smoking Status  . Former Smoker  . Quit date: 04/06/2007  Smokeless Tobacco  . Never Used    Goals Met:  Proper associated with RPD/PD & O2 Sat Independence with exercise equipment Exercise tolerated well No report of cardiac concerns or symptoms Strength training completed today  Goals Unmet:  Not Applicable  Comments: Pt able to follow exercise prescription today without complaint.  Will continue to monitor for progression.   Dr. Emily Filbert is Medical Director for [place] and [facility] Pulmonary Rehabilitation.

## 2017-08-21 DIAGNOSIS — J449 Chronic obstructive pulmonary disease, unspecified: Secondary | ICD-10-CM

## 2017-08-21 DIAGNOSIS — I214 Non-ST elevation (NSTEMI) myocardial infarction: Secondary | ICD-10-CM | POA: Diagnosis not present

## 2017-08-21 NOTE — Progress Notes (Signed)
Daily Session Note  Patient Details  Name: Thomas Trujillo MRN: [id number] Date of Birth: 07/22/1946 Referring Provider:     Pulmonary Rehab from 05/30/2017 in [facility] and [facility] Rehab  Referring Provider  Fleming, [place] MD      Encounter Date: 08/21/2017  Check In:     Session Check In - 08/21/17 1000      Check-In   Location [facility]   Staff Present Amanda Sommer, BA, ACSM CEP, Exercise Physiologist;Kelly Hayes, BS, ACSM CEP, Exercise Physiologist;Joseph Hood RCP,RRT,BSRT   Supervising physician immediately available to respond to emergencies [facility] immediately available ER MD   Physician(s) Dr. Malinda and Lord   Medication changes reported     No   Fall or balance concerns reported    No   Warm-up and Cool-down Performed as group-led instruction   Resistance Training Performed Yes   VAD Patient? No     Pain Assessment   Currently in Pain? No/denies         History  Smoking Status  . Former Smoker  . Quit date: 04/06/2007  Smokeless Tobacco  . Never Used    Goals Met:  Proper associated with RPD/PD & O2 Sat Independence with exercise equipment Exercise tolerated well No report of cardiac concerns or symptoms Strength training completed today  Goals Unmet:  Not Applicable  Comments: Pt able to follow exercise prescription today without complaint.  Will continue to monitor for progression.   Dr. Mark Miller is Medical Director for [facility] and [facility]. 

## 2017-08-23 ENCOUNTER — Encounter: Payer: Medicare Other | Admitting: *Deleted

## 2017-08-23 DIAGNOSIS — J449 Chronic obstructive pulmonary disease, unspecified: Secondary | ICD-10-CM

## 2017-08-23 DIAGNOSIS — I214 Non-ST elevation (NSTEMI) myocardial infarction: Secondary | ICD-10-CM | POA: Diagnosis not present

## 2017-08-23 NOTE — Progress Notes (Signed)
Daily Session Note  Patient Details  Name: Rudolpho Claxton MRN: [id number] Date of Birth: Feb 07, 1946 Referring Provider:     Pulmonary Rehab from 05/30/2017 in [facility]  Referring Provider  Ancil Linsey MD      Encounter Date: 08/23/2017  Check In:     Session Check In - 08/23/17 0959      Check-In   Location [facility]   Staff Present Alberteen Sam, MA, ACSM RCEP, Exercise Physiologist;Amanda Oletta Darter, BA, ACSM CEP, Exercise Physiologist;Joseph Flavia Shipper   Supervising physician immediately available to respond to emergencies [facility] immediately available ER MD   Physician(s) Drs. Alfred Levins and Lord   Medication changes reported     No   Fall or balance concerns reported    No   Warm-up and Cool-down Performed as group-led Location manager Performed Yes   VAD Patient? No     Pain Assessment   Currently in Pain? No/denies         History  Smoking Status  . Former Smoker  . Quit date: 04/06/2007  Smokeless Tobacco  . Never Used    Goals Met:  Proper associated with RPD/PD & O2 Sat Independence with exercise equipment Using PLB without cueing & demonstrates good technique Exercise tolerated well No report of cardiac concerns or symptoms Strength training completed today  Goals Unmet:  Not Applicable  Comments: Pt able to follow exercise prescription today without complaint.  Will continue to monitor for progression.    Dr. Emily Filbert is Medical Director for [place] and [facility].

## 2017-08-25 ENCOUNTER — Encounter: Payer: Medicare Other | Attending: Specialist

## 2017-08-25 ENCOUNTER — Telehealth: Payer: Self-pay

## 2017-08-25 DIAGNOSIS — I214 Non-ST elevation (NSTEMI) myocardial infarction: Secondary | ICD-10-CM | POA: Insufficient documentation

## 2017-08-25 DIAGNOSIS — Z951 Presence of aortocoronary bypass graft: Secondary | ICD-10-CM | POA: Insufficient documentation

## 2017-08-25 DIAGNOSIS — Z79899 Other long term (current) drug therapy: Secondary | ICD-10-CM | POA: Insufficient documentation

## 2017-08-25 NOTE — Telephone Encounter (Signed)
Kj called to say he would not be attending [place] today. He had a flu shot yesterday and did not feel well around midnight.  His sleep was off and he is too worn out to attend.

## 2017-08-28 DIAGNOSIS — I214 Non-ST elevation (NSTEMI) myocardial infarction: Secondary | ICD-10-CM | POA: Diagnosis present

## 2017-08-28 DIAGNOSIS — Z951 Presence of aortocoronary bypass graft: Secondary | ICD-10-CM | POA: Diagnosis not present

## 2017-08-28 DIAGNOSIS — J449 Chronic obstructive pulmonary disease, unspecified: Secondary | ICD-10-CM

## 2017-08-28 DIAGNOSIS — Z79899 Other long term (current) drug therapy: Secondary | ICD-10-CM | POA: Diagnosis not present

## 2017-08-28 NOTE — Progress Notes (Signed)
Daily Session Note  Patient Details  Name: Thomas Trujillo MRN: [id number] Date of Birth: May 01, 1946 Referring Provider:     Pulmonary Rehab from 05/30/2017 in [facility] and Pulmonary Rehab  Referring Provider  Ancil Linsey MD      Encounter Date: 08/28/2017  Check In: Session Check In - 08/28/17 1030      Check-In   Location  [facility]    Staff Present  Earlean Shawl, BS, ACSM CEP, Exercise Physiologist;Amanda Oletta Darter, BA, ACSM CEP, Exercise Physiologist;Joseph Flavia Shipper    Supervising physician immediately available to respond to emergencies  [facility] immediately available ER MD    Physician(s)  Drs. Alfred Levins and [place]    Medication changes reported      No    Fall or balance concerns reported     No    Warm-up and Cool-down  Performed as group-led Higher education careers adviser Performed  Yes    VAD Patient?  No      Pain Assessment   Currently in Pain?  No/denies          Social History   Tobacco Use  Smoking Status Former Smoker  . Last attempt to quit: 04/06/2007  . Years since quitting: 10.4  Smokeless Tobacco Never Used    Goals Met:  Independence with exercise equipment Exercise tolerated well No report of cardiac concerns or symptoms Strength training completed today  Goals Unmet:  Not Applicable  Comments: Pt able to follow exercise prescription today without complaint.  Will continue to monitor for progression.   Dr. Emily Filbert is Medical Director for [place] and [facility].

## 2017-08-30 VITALS — Ht 64.6 in | Wt 152.0 lb

## 2017-08-30 DIAGNOSIS — J449 Chronic obstructive pulmonary disease, unspecified: Secondary | ICD-10-CM

## 2017-08-30 DIAGNOSIS — I214 Non-ST elevation (NSTEMI) myocardial infarction: Secondary | ICD-10-CM | POA: Diagnosis not present

## 2017-08-30 NOTE — Progress Notes (Signed)
Daily Session Note  Patient Details  Name: Thomas Trujillo MRN: [id number] Date of Birth: [birth year] Referring Provider:     Pulmonary Rehab from 05/30/2017 in [facility] Cardiac and Pulmonary Rehab  Referring Provider  Ancil Linsey MD      Encounter Date: 08/30/2017  Check In: Session Check In - 08/30/17 0958      Check-In   Location  [facility]    Staff Present  Nada Maclachlan, BA, ACSM CEP, Exercise Physiologist;Naren Benally Darrin Nipper, [place], ACSM RCEP, Exercise Physiologist    Supervising physician immediately available to respond to emergencies  [facility] immediately available ER MD    Physician(s)  Dr. Jimmye Norman and [facility]    Medication changes reported      No    Fall or balance concerns reported     No    Warm-up and Cool-down  Performed as group-led instruction    Resistance Training Performed  Yes    VAD Patient?  No      Pain Assessment   Currently in Pain?  No/denies          Social History   Tobacco Use  Smoking Status Former Smoker  . Last attempt to quit: 04/06/2007  . Years since quitting: 10.4  Smokeless Tobacco Never Used    Goals Met:  Independence with exercise equipment Exercise tolerated well No report of cardiac concerns or symptoms Strength training completed today  Goals Unmet:  Not Applicable  Comments: Pt able to follow exercise prescription today without complaint.  Will continue to monitor for progression.  [place] Name 03/30/17 1528 05/24/17 0835 05/24/17 0839     6 Minute Walk   Phase  -  Discharge  -   Distance  810 feet  1370 feet  -   Distance % Change  -  69.1 % 560 ft  -   Walk Time  4.5 minutes  6 minutes  -   # of Rest Breaks  _0 sec  -   MPH  2.04  2.69  -   METS  2.6  3.32  -   RPE  17  13  -   Perceived Dyspnea   3  3  -   VO2 Peak  9.11  11.61  -   Symptoms  No  Yes (comment)  -   Comments  -  SOB, started on Room Air but changed to 2L at 4 min after  saturations had dropped to 86%.  -   Resting HR  106 bpm  95 bpm  -   Resting BP  102/56  124/60  -   Max Ex. HR  126 bpm  110 bpm  -   Max Ex. BP  162/60  154/70  -   2 Minute Post BP  112/58  -  -     Interval Oxygen   Interval Oxygen?  Yes  -  -   Baseline Oxygen Saturation %  97 %  -  98 %   Resting Liters of Oxygen  2 L  -  0 L Room Air   1 Minute Oxygen Saturation %  96 %  -  -   1 Minute Liters of Oxygen  2 L  -  -   2 Minute Oxygen Saturation %  94 %  -  -   2 Minute Liters of Oxygen  2 L  -  -   3 Minute Liters  of Oxygen  2 L  -  -   4 Minute Oxygen Saturation %  93 %  -  86 %   4 Minute Liters of Oxygen  2 L  -  0 L   5 Minute Liters of Oxygen  2 L  -  -   6 Minute Oxygen Saturation %  94 %  -  92 %   6 Minute Liters of Oxygen  2 L  -  2 L   2 Minute Post Liters of Oxygen  2 L  -  -   [place] Name 05/30/17 1243 08/30/17 1045       6 Minute Walk   Phase  Initial  Discharge    Distance  340 feet  1200 feet    Distance % Change  -  252.9 %    Distance Feet Change  -  860 ft    Walk Time  1.75 minutes  6 minutes    # of Rest Breaks  2 2:11 and 2:06  0    MPH  2.25  2.27    METS  1.42  3.14    RPE  15  15    Perceived Dyspnea   4  3    VO2 Peak  4.98  10.9    Symptoms  Yes (comment)  Yes (comment)    Comments  SOB, fatigue, tight breathing, felt like coming down with cold.  Oxygen saturations dropped once seated both time.  SOB    Resting HR  88 bpm  102 bpm    Resting BP  134/70  134/60    Resting Oxygen Saturation   -  99 %    Exercise Oxygen Saturation  during 6 min walk  -  89 %    Max Ex. HR  99 bpm  119 bpm    Max Ex. BP  150/68  154/72    2 Minute Post BP  126/64  136/64      Interval HR   Baseline HR (retired)  88  -    1 Minute HR  95  104    2 Minute HR  86  117    3 Minute HR  89  119    4 Minute HR  99  114    5 Minute HR  87  114    6 Minute HR  86  116    2 Minute Post HR  88  102    Interval Heart Rate?  Yes  Yes      Interval Oxygen    Interval Oxygen?  Yes  Yes    Baseline Oxygen Saturation %  91 %  99 %    Resting Liters of Oxygen  2 L  -    1 Minute Oxygen Saturation %  93 %  99 %    1 Minute Liters of Oxygen  2 L  2 L    2 Minute Oxygen Saturation %  84 % seated rest from 0:48-2:59  97 %    2 Minute Liters of Oxygen  2 L  2 L    3 Minute Oxygen Saturation %  91 %  93 %    3 Minute Liters of Oxygen  2 L  2 L    4 Minute Oxygen Saturation %  92 % seated stopped at 3:54  91 %    4 Minute Liters of Oxygen  2 L  2 L    5  Minute Oxygen Saturation %  85 %  91 %    5 Minute Liters of Oxygen  2 L  2 L    6 Minute Oxygen Saturation %  91 %  90 % 6:06 89%    6 Minute Liters of Oxygen  2 L  2 L    2 Minute Post Oxygen Saturation %  95 %  97 %    2 Minute Post Liters of Oxygen  2 L  2 L      Pt taken to meet with Dr. Emily Filbert, Medical Director, for face to face meeting today.  Chart reviewed and signed.   Dr. Emily Filbert is Medical Director for [place] and [facility].

## 2017-09-01 DIAGNOSIS — J449 Chronic obstructive pulmonary disease, unspecified: Secondary | ICD-10-CM

## 2017-09-01 DIAGNOSIS — I214 Non-ST elevation (NSTEMI) myocardial infarction: Secondary | ICD-10-CM | POA: Diagnosis not present

## 2017-09-01 NOTE — Progress Notes (Signed)
Daily Session Note  Patient Details  Name: Thomas Trujillo MRN: [id number] Date of Birth: 1946-05-01 Referring Provider:     Pulmonary Rehab from 05/30/2017 in [facility] Re[facility]  Referring Provider  Ancil Linsey MD      Encounter Date: 09/01/2017  Check In: Session Check In - 09/01/17 1014      Check-In   Location  [facility]    Staff Present  Nada Maclachlan, BA, ACSM CEP, Exercise Physiologist;Carroll Enterkin, RN, BSN;Wilkes Potvin [employer] physician immediately available to respond to emergencies  [facility] immediately available ER MD    Physician(s)  Dr. Mable Paris and [facility]    Medication changes reported      No    Fall or balance concerns reported     No    Warm-up and Cool-down  Performed as group-led instruction    Resistance Training Performed  Yes    VAD Patient?  No      Pain Assessment   Currently in Pain?  No/denies          Social History   Tobacco Use  Smoking Status Former Smoker  . Last attempt to quit: 04/06/2007  . Years since quitting: 10.4  Smokeless Tobacco Never Used    Goals Met:  Independence with exercise equipment Exercise tolerated well No report of cardiac concerns or symptoms Strength training completed today  Goals Unmet:  Not Applicable  Comments: Pt able to follow exercise prescription today without complaint.  Will continue to monitor for progression.   Dr. Emily Filbert is Medical Director for [place] and [facility].

## 2017-09-04 DIAGNOSIS — I214 Non-ST elevation (NSTEMI) myocardial infarction: Secondary | ICD-10-CM | POA: Diagnosis not present

## 2017-09-04 DIAGNOSIS — J449 Chronic obstructive pulmonary disease, unspecified: Secondary | ICD-10-CM

## 2017-09-04 NOTE — Progress Notes (Addendum)
Pulmonary Individual Treatment Plan  Patient Details  Trujillo: Thomas Trujillo MRN: [id number] Date of Birth: 18-Oct-1946 Referring Provider:     Pulmonary Rehab from 05/30/2017 in [facility] Card[facility] Pulmonary Re[facility]  Referring Provider  Ancil Linsey MD      Initial Encounter Date:    Pulmonary Rehab from 05/30/2017 in [facility] Cardiac and Pulmonary Rehab  Date  05/30/17  Referring Provider  Ancil Linsey MD      Visit Diagnosis: Chronic obstructive pulmonary disease, unspecified COPD type ([place])  Patient's Home Medications on Admission:  Current Outpatient Medications:  .  aspirin 325 MG EC tablet, Take 1 tablet (325 mg total) by mouth daily., Disp: 30 tablet, Rfl: 0 .  atorvastatin (LIPITOR) 40 MG tablet, Take 1 tablet (40 mg total) by mouth daily., Disp: 90 tablet, Rfl: 3 .  bisacodyl (DULCOLAX) 5 MG EC tablet, Take 2 tablets (10 mg total) by mouth daily as needed for moderate constipation (Give daily if no BM)., Disp: 30 tablet, Rfl: 0 .  budesonide (PULMICORT) 0.5 MG/2ML nebulizer solution, Take 0.5 mg by nebulization daily., Disp: , Rfl:  .  ferrous sulfate 325 (65 FE) MG tablet, Take 325 mg by mouth daily with breakfast., Disp: , Rfl:  .  furosemide (LASIX) 20 MG tablet, TAKE 1-2 TABS AS DIRECTED ONCE DAILY WITH ADDITIONAL DOSE IF GAINS 2 POUNDS IN A DAY OR 5 IN A WEEK, Disp: 60 tablet, Rfl: 3 .  ipratropium-albuterol (DUONEB) 0.5-2.5 (3) MG/3ML SOLN, Take 3 mLs by nebulization every 6 (six) hours as needed. When awake , Disp: , Rfl:  .  metoprolol tartrate (LOPRESSOR) 25 MG tablet, Take 12.5 mg by mouth 2 (two) times daily., Disp: , Rfl:  .  OXYGEN, Inhale into the lungs. By nasal cannula and wean as tolerated to keep O2 sats >90%, Disp: , Rfl:  .  potassium chloride (K-DUR) 10 MEQ tablet, Take 10 mEq by mouth daily., Disp: , Rfl:  .  tamsulosin (FLOMAX) 0.4 MG CAPS capsule, Take 1 capsule (0.4 mg total) by mouth daily., Disp: 30 capsule, Rfl: 1  Past Medical History: Past Medical  History:  Diagnosis Date  . Bleb, lung ([place])   . Coronary artery disease 02/14/2017   NSTEMI with urgent CABG (LIMA-LAD and SVG->ramus)  . Hyperlipidemia   . Ischemic cardiomyopathy   . Patient denies medical problems   . Pneumothorax 05/2017   Left    Tobacco Use: Social History   Tobacco Use  Smoking Status Former Smoker  . Last attempt to quit: 04/06/2007  . Years since quitting: 10.4  Smokeless Tobacco Never Used    Labs: Recent Review Scientist, physiological    Labs for ITP Cardiac and Pulmonary Rehab Latest Ref Rng & Units 02/15/2017 02/15/2017 02/15/2017 02/18/2017 02/18/2017   Cholestrol 0 - 200 mg/dL - - - - -   LDLCALC 0 - 99 mg/dL - - - - -   HDL >40 mg/dL - - - - -   Trlycerides <150 mg/dL - - - - -   Hemoglobin A1c 4.8 - 5.6 % - - - - -   PHART 7.350 - 7.450 7.316(L) 7.344(L) - 7.393 -   PCO2ART 32.0 - 48.0 mmHg 46.8 42.7 - 50.1(H) -   HCO3 20.0 - 28.0 mmol/L 23.7 23.3 - 30.5(H) -   TCO2 0 - 100 mmol/L _0 32 30   ACIDBASEDEF 0.0 - 2.0 mmol/L 2.0 2.0 - - -   O2SAT % 96.0 96.0 - 98.0 -  Pulmonary Assessment Scores: Pulmonary Assessment Scores    Row Trujillo 05/30/17 1128 07/21/17 1018 08/28/17 1110     ADL UCSD   ADL Phase  Entry  Mid  Exit   SOB Score total  38  41  36   Rest  0  0  0   Walk  _0 Stairs  _1 Bath  _2 Dress  1  0  1   Shop  _3 CAT Score   CAT Score  22  -  21     mMRC Score   mMRC Score  1  -  -      Pulmonary Function Assessment: Pulmonary Function Assessment - 05/30/17 1142      Pulmonary Function Tests   FVC%  108 %    FEV1%  39 %    FEV1/FVC Ratio  28    RV%  153 %    DLCO%  59 %      Breath   Bilateral Breath Sounds  Clear    Shortness of Breath  Yes;Limiting activity       Exercise Target Goals:    Exercise Program Goal: Individual exercise prescription set with THRR, safety & activity barriers. Participant demonstrates ability to understand and report RPE using BORG scale, to  self-measure pulse accurately, and to acknowledge the importance of the exercise prescription.  Exercise Prescription Goal: Starting with aerobic activity 30 plus minutes a day, 3 days per week for initial exercise prescription. Provide home exercise prescription and guidelines that participant acknowledges understanding prior to discharge.  Activity Barriers & Risk Stratification: Activity Barriers & Cardiac Risk Stratification - 05/30/17 1247      Activity Barriers & Cardiac Risk Stratification   Activity Barriers  Muscular Weakness;Shortness of Breath       6 Minute Walk: 6 Minute Walk    Row Trujillo 03/30/17 1528 05/24/17 0835 05/24/17 0839     6 Minute Walk   Phase  -  Discharge  -   Distance  810 feet  1370 feet  -   Distance % Change  -  69.1 % 560 ft  -   Walk Time  4.5 minutes  6 minutes  -   # of Rest Breaks  _4 sec  -   MPH  2.04  2.69  -   METS  2.6  3.32  -   RPE  17  13  -   Perceived Dyspnea   3  3  -   VO2 Peak  9.11  11.61  -   Symptoms  No  Yes (comment)  -   Comments  -  SOB, started on Room Air but changed to 2L at 4 min after saturations had dropped to 86%.  -   Resting HR  106 bpm  95 bpm  -   Resting BP  102/56  124/60  -   Max Ex. HR  126 bpm  110 bpm  -   Max Ex. BP  162/60  154/70  -   2 Minute Post BP  112/58  -  -     Interval Oxygen   Interval Oxygen?  Yes  -  -   Baseline Oxygen Saturation %  97 %  -  98 %   Resting Liters of Oxygen  2 L  -  0 L Room Air   1 Minute Oxygen Saturation %  96 %  -  -   1 Minute Liters of Oxygen  2 L  -  -   2 Minute Oxygen Saturation %  94 %  -  -   2 Minute Liters of Oxygen  2 L  -  -   3 Minute Liters of Oxygen  2 L  -  -   4 Minute Oxygen Saturation %  93 %  -  86 %   4 Minute Liters of Oxygen  2 L  -  0 L   5 Minute Liters of Oxygen  2 L  -  -   6 Minute Oxygen Saturation %  94 %  -  92 %   6 Minute Liters of Oxygen  2 L  -  2 L   2 Minute Post Liters of Oxygen  2 L  -  -   Row Trujillo 05/30/17 1243  08/30/17 1045       6 Minute Walk   Phase  Initial  Discharge    Distance  340 feet  1200 feet    Distance % Change  -  252.9 %    Distance Feet Change  -  860 ft    Walk Time  1.75 minutes  6 minutes    # of Rest Breaks  2 2:11 and 2:06  0    MPH  2.25  2.27    METS  1.42  3.14    RPE  15  15    Perceived Dyspnea   4  3    VO2 Peak  4.98  10.9    Symptoms  Yes (comment)  Yes (comment)    Comments  SOB, fatigue, tight breathing, felt like coming down with cold.  Oxygen saturations dropped once seated both time.  SOB    Resting HR  88 bpm  102 bpm    Resting BP  134/70  134/60    Resting Oxygen Saturation   -  99 %    Exercise Oxygen Saturation  during 6 min walk  -  89 %    Max Ex. HR  99 bpm  119 bpm    Max Ex. BP  150/68  154/72    2 Minute Post BP  126/64  136/64      Interval HR   Baseline HR (retired)  88  -    1 Minute HR  95  104    2 Minute HR  86  117    3 Minute HR  89  119    4 Minute HR  99  114    5 Minute HR  87  114    6 Minute HR  86  116    2 Minute Post HR  88  102    Interval Heart Rate?  Yes  Yes      Interval Oxygen   Interval Oxygen?  Yes  Yes    Baseline Oxygen Saturation %  91 %  99 %    Resting Liters of Oxygen  2 L  -    1 Minute Oxygen Saturation %  93 %  99 %    1 Minute Liters of Oxygen  2 L  2 L    2 Minute Oxygen Saturation %  84 % seated rest from 0:48-2:59  97 %    2 Minute Liters of Oxygen  2 L  2  L    3 Minute Oxygen Saturation %  91 %  93 %    3 Minute Liters of Oxygen  2 L  2 L    4 Minute Oxygen Saturation %  92 % seated stopped at 3:54  91 %    4 Minute Liters of Oxygen  2 L  2 L    5 Minute Oxygen Saturation %  85 %  91 %    5 Minute Liters of Oxygen  2 L  2 L    6 Minute Oxygen Saturation %  91 %  90 % 6:06 89%    6 Minute Liters of Oxygen  2 L  2 L    2 Minute Post Oxygen Saturation %  95 %  97 %    2 Minute Post Liters of Oxygen  2 L  2 L      Oxygen Initial Assessment: Oxygen Initial Assessment - 05/30/17 1139       Home Oxygen   Home Oxygen Device  Home Concentrator;E-Tanks    Sleep Oxygen Prescription  None    Home Exercise Oxygen Prescription  Continuous    Liters per minute  2    Home at Rest Exercise Oxygen Prescription  None unless short of breath   unless short of breath   Compliance with Home Oxygen Use  Yes      Program Oxygen Prescription   Program Oxygen Prescription  Continuous    Liters per minute  2      Intervention   Short Term Goals  To learn and exhibit compliance with exercise, home and travel O2 prescription;To learn and understand importance of monitoring SPO2 with pulse oximeter and demonstrate accurate use of the pulse oximeter.;To Learn and understand importance of maintaining oxygen saturations>88%;To learn and demonstrate proper purse lipped breathing techniques or other breathing techniques.;To learn and demonstrate proper use of respiratory medications    Long  Term Goals  Exhibits compliance with exercise, home and travel O2 prescription;Maintenance of O2 saturations>88%;Compliance with respiratory medication;Demonstrates proper use of MDI's;Exhibits proper breathing techniques, such as purse lipped breathing or other method taught during program session;Verbalizes importance of monitoring SPO2 with pulse oximeter and return demonstration       Oxygen Re-Evaluation: Oxygen Re-Evaluation    Row Trujillo 04/07/17 0856 04/21/17 0841 05/17/17 0901 07/05/17 1034 08/02/17 1103     Program Oxygen Prescription   Program Oxygen Prescription  Continuous  Continuous  Continuous on treadmill mainly, trying to come off for seated equipment  Continuous o2 on treadmill and as needed  Continuous oxygen on treadmill as needed   Liters per minute  2  -  _0 Comments  -  Thomas Trujillo arrives on 2 liters of oxgyen that he reports he has had since surgery this year. Thomas Trujillo said he didn't use his oxgyen to sleep last night. He reported that he shut off his oxgyen for a few minutes while riding the  bicycle in Cardiac Rehab today.  -  -  -     Home Oxygen   Home Oxygen Device  Home Concentrator;E-Tanks  -  Home Concentrator;E-Tanks  Home Concentrator;E-Tanks  Home Concentrator;E-Tanks   Sleep Oxygen Prescription  None  -  None  None  None   Home Exercise Oxygen Prescription  Continuous  Continuous  Continuous only with activity  Continuous  Continuous   Liters per minute  _1 Home  at Rest Exercise Oxygen Prescription  None  -  None  None  None   Compliance with Home Oxygen Use  Yes  Yes  Yes  Yes  Yes     Goals/Expected Outcomes   Short Term Goals  -  To learn and exhibit compliance with exercise, home and travel O2 prescription;To learn and understand importance of monitoring SPO2 with pulse oximeter and demonstrate accurate use of the pulse oximeter.  To learn and exhibit compliance with exercise, home and travel O2 prescription;To learn and understand importance of monitoring SPO2 with pulse oximeter and demonstrate accurate use of the pulse oximeter.;To Learn and understand importance of maintaining oxygen saturations>88%  To learn and exhibit compliance with exercise, home and travel O2 prescription;To learn and understand importance of monitoring SPO2 with pulse oximeter and demonstrate accurate use of the pulse oximeter.;To learn and understand importance of maintaining oxygen saturations>88%;To learn and demonstrate proper pursed lip breathing techniques or other breathing techniques.;To learn and demonstrate proper use of respiratory medications  To learn and exhibit compliance with exercise, home and travel O2 prescription;To learn and understand importance of maintaining oxygen saturations>88%;To learn and demonstrate proper use of respiratory medications;To learn and demonstrate proper pursed lip breathing techniques or other breathing techniques.;To learn and understand importance of monitoring SPO2 with pulse oximeter and demonstrate accurate use of the pulse oximeter.    Long  Term Goals  -  -  Exhibits compliance with exercise, home and travel O2 prescription;Verbalizes importance of monitoring SPO2 with pulse oximeter and return demonstration;Maintenance of O2 saturations>88%;Exhibits proper breathing techniques, such as purse lipped breathing or other method taught during program session  Exhibits compliance with exercise, home and travel O2 prescription;Verbalizes importance of monitoring SPO2 with pulse oximeter and return demonstration;Maintenance of O2 saturations>88%;Exhibits proper breathing techniques, such as pursed lip breathing or other method taught during program session;Compliance with respiratory medication;Demonstrates proper use of MDI's  Verbalizes importance of monitoring SPO2 with pulse oximeter and return demonstration;Exhibits proper breathing techniques, such as pursed lip breathing or other method taught during program session;Demonstrates proper use of MDI's;Compliance with respiratory medication;Maintenance of O2 saturations>88%;Exhibits compliance with exercise, home and travel O2 prescription   Comments  Thomas Trujillo used our oxgyen tank on 2liters/minute during Cardiac Rehab then switched to his home oxgyen when he left to go home.   -  Thomas Trujillo is trying to use less oxygen for exercise. He really wants to come off his oxygen altogether.  He is no longer using it at rest and just for exertion, but it is starting to get better.  His oxygen saturations have been good on room air.   Thomas Trujillo has done well to work on PLB to increase his oxygen before he puts on his cannula. He is taking his nebulizers at home four times a day as prescribed. Thomas Trujillo is proficient at checking his blood pressure and oxygen saturations at home. He uses his oxygen at home if he does something strenuous but only for a few minutes.  Thomas Trujillo has his own pulse oximeter and checks his oxygen at home. He verbalizes understanding that he should be 88 percent or above. He takes his albuterol and  Pulmicort nebulizer 4 times a day. His PLB skills have been getting better.   Goals/Expected Outcomes  -  -  Short: Start to exercise more without his oxygen and reduce amount used on treadmill.  Long: Come off oxygen completely.  Short: work on PLB, maintain oxygen above 88%. Long Be proficient in PLB, use  less oxygen with exercise  Short: continue to manage his oxygen at home. Long: Maintain his COPD at home and exercise independently.   Thomas Trujillo 08/21/17 1712             Program Oxygen Prescription   Program Oxygen Prescription  Continuous uses oxygen on treadmill       Liters per minute  2         Home Oxygen   Home Oxygen Device  Home Concentrator;E-Tanks       Sleep Oxygen Prescription  None       Home Exercise Oxygen Prescription  Continuous       Liters per minute  2       Home at Rest Exercise Oxygen Prescription  None       Compliance with Home Oxygen Use  Yes         Goals/Expected Outcomes   Short Term Goals  To learn and exhibit compliance with exercise, home and travel O2 prescription;To learn and understand importance of maintaining oxygen saturations>88%;To learn and demonstrate proper use of respiratory medications;To learn and demonstrate proper pursed lip breathing techniques or other breathing techniques.;To learn and understand importance of monitoring SPO2 with pulse oximeter and demonstrate accurate use of the pulse oximeter.       Long  Term Goals  Verbalizes importance of monitoring SPO2 with pulse oximeter and return demonstration;Exhibits proper breathing techniques, such as pursed lip breathing or other method taught during program session;Demonstrates proper use of MDI's;Maintenance of O2 saturations>88%;Compliance with respiratory medication;Exhibits compliance with exercise, home and travel O2 prescription       Comments  Thomas Trujillo has been using oxygen on most machines as of late. He needs to practice PLB more. He needs to be reminded to use PLB most times he gets  short of breath. Once he gets use to using PLB he may not need oxygen on the sit down machines. He checks his oxygen and blood pressure at home three times a day.       Goals/Expected Outcomes  Short: Work on PLB. Long: Be independent with PLB.          Oxygen Discharge (Final Oxygen Re-Evaluation): Oxygen Re-Evaluation - 08/21/17 1712      Program Oxygen Prescription   Program Oxygen Prescription  Continuous uses oxygen on treadmill   uses oxygen on treadmill   Liters per minute  2      Home Oxygen   Home Oxygen Device  Home Concentrator;E-Tanks    Sleep Oxygen Prescription  None    Home Exercise Oxygen Prescription  Continuous    Liters per minute  2    Home at Rest Exercise Oxygen Prescription  None    Compliance with Home Oxygen Use  Yes      Goals/Expected Outcomes   Short Term Goals  To learn and exhibit compliance with exercise, home and travel O2 prescription;To learn and understand importance of maintaining oxygen saturations>88%;To learn and demonstrate proper use of respiratory medications;To learn and demonstrate proper pursed lip breathing techniques or other breathing techniques.;To learn and understand importance of monitoring SPO2 with pulse oximeter and demonstrate accurate use of the pulse oximeter.    Long  Term Goals  Verbalizes importance of monitoring SPO2 with pulse oximeter and return demonstration;Exhibits proper breathing techniques, such as pursed lip breathing or other method taught during program session;Demonstrates proper use of MDI's;Maintenance of O2 saturations>88%;Compliance with respiratory medication;Exhibits compliance with exercise, home and travel O2 prescription  Comments  Thomas Trujillo has been using oxygen on most machines as of late. He needs to practice PLB more. He needs to be reminded to use PLB most times he gets short of breath. Once he gets use to using PLB he may not need oxygen on the sit down machines. He checks his oxygen and blood pressure at  home three times a day.    Goals/Expected Outcomes  Short: Work on PLB. Long: Be independent with PLB.       Initial Exercise Prescription: Initial Exercise Prescription - 05/30/17 1200      Date of Initial Exercise RX and Referring Provider   Date  05/30/17    Referring Provider  Ancil Linsey MD      Oxygen   Oxygen  Continuous Primarily on treadmill   Primarily on treadmill   Liters  2      Treadmill   MPH  2    Grade  1    Minutes  15    METs  2.81      NuStep   Level  4    SPM  80    Minutes  15    METs  2.8      Arm Ergometer   Level  3    RPM  35    Minutes  15    METs  2.8      Resistance Training   Training Prescription  Yes    Weight  3 lb    Reps  10-15       Perform Capillary Blood Glucose checks as needed.  Exercise Prescription Changes: Exercise Prescription Changes    Row Trujillo 03/30/17 1500 04/05/17 1600 04/12/17 0900 04/18/17 1200 05/02/17 1500     Response to Exercise   Blood Pressure (Admit)  102/56  122/50  -  108/56  130/70   Blood Pressure (Exercise)  162/60  116/50  -  138/78  136/72   Blood Pressure (Exit)  112/58  118/64  -  138/70  122/70   Heart Rate (Admit)  109 bpm  102 bpm  -  90 bpm  80 bpm   Heart Rate (Exercise)  129 bpm  116 bpm  -  117 bpm  115 bpm   Heart Rate (Exit)  111 bpm  102 bpm  -  84 bpm  103 bpm   Oxygen Saturation (Admit)  96 %  97 %  -  -  -   Oxygen Saturation (Exercise)  94 %  -  -  -  -   Rating of Perceived Exertion (Exercise)  -  12  -  13  13   Symptoms  -  none  none  none  none   Duration  -  Progress to 45 minutes of aerobic exercise without signs/symptoms of physical distress  Progress to 45 minutes of aerobic exercise without signs/symptoms of physical distress  Continue with 45 min of aerobic exercise without signs/symptoms of physical distress.  Continue with 45 min of aerobic exercise without signs/symptoms of physical distress.   Intensity  -  THRR unchanged  THRR unchanged  THRR unchanged  THRR  unchanged     Progression   Progression  -  Continue to progress workloads to maintain intensity without signs/symptoms of physical distress.  Continue to progress workloads to maintain intensity without signs/symptoms of physical distress.  Continue to progress workloads to maintain intensity without signs/symptoms of physical distress.  Continue to progress workloads to maintain intensity without  signs/symptoms of physical distress.   Average METs  -  2.5  2.5  2.55  2.6     Resistance Training   Training Prescription  -  Yes  Yes  Yes  Yes   Weight  -  3 lbs  3 lbs  3 lbs  3 lb   Reps  -  10-15  10-15  10-15  10-15     Interval Training   Interval Training  -  No  No  No  No     Oxygen   Oxygen  -  -  -  -  Continuous   Liters  -  -  -  -  2     Treadmill   MPH  -  -  -  2  -   Grade  -  -  -  0.5  -   Minutes  -  -  -  15  -   METs  -  -  -  2.67  -     Recumbant Bike   Level  -  _0 Watts  -  _1 Minutes  -  _2 METs  -  2.59  2.59  2.67  2.59     NuStep   Level  -  _3 Minutes  -  _4 METs  -  2.5  2.5  2.3  2.6     Home Exercise Plan   Plans to continue exercise at  -  -  Home (comment) walking  Home (comment) walking  -   Frequency  -  -  Add 2 additional days to program exercise sessions.  Add 2 additional days to program exercise sessions.  -   Initial Home Exercises Provided  -  -  04/12/17  04/12/17  -   T[place]lina Trujillo 05/18/17 1100 05/30/17 1200 06/21/17 1400 07/05/17 1400 07/19/17 1500     Response to Exercise   Blood Pressure (Admit)  110/58  134/70  110/58  126/62  128/60   Blood Pressure (Exercise)  132/60  150/68  -  -  -   Blood Pressure (Exit)  126/70  126/64  106/58  114/60  98/50   Heart Rate (Admit)  96 bpm  88 bpm  92 bpm  111 bpm  96 bpm   Heart Rate (Exercise)  105 bpm  99 bpm  92 bpm  118 bpm  119 bpm   Heart Rate (Exit)  77 bpm  88 bpm  92 bpm  107 bpm  111 bpm   Rating of Perceived Exertion  (Exercise)  _5 Perceived Dyspnea (Exercise)  -  _6 Symptoms  none  SOB  none  none  none   Comments  -  walk test results  -  -  -   Duration  Continue with 45 min of aerobic exercise without signs/symptoms of physical distress.  -  Continue with 45 min of aerobic exercise without signs/symptoms of physical distress.  Continue with 45 min of aerobic exercise without signs/symptoms of physical distress.  Continue with 45 min of aerobic exercise without signs/symptoms of physical distress.   Intensity  THRR unchanged  -  THRR unchanged  THRR unchanged  THRR unchanged     Progression   Progression  Continue to progress workloads to maintain intensity without signs/symptoms of physical distress.  -  Continue to progress workloads to maintain intensity without signs/symptoms of physical distress.  Continue to progress workloads to maintain intensity without signs/symptoms of physical distress.  Continue to progress workloads to maintain intensity without signs/symptoms of physical distress.   Average METs  2.98  -  2.65  2.25  2.6     Resistance Training   Training Prescription  Yes  -  Yes  Yes  Yes   Weight  3 lb  -  3 lb  3 lb  3 lb   Reps  10-15  -  10-15  10-15  10-15     Interval Training   Interval Training  No  -  No  No  No     Oxygen   Oxygen  Continuous only on treadmill  -  Continuous  Continuous  Continuous   Liters  2  -  2 TM only   2 treadmill only  2 TM only     Treadmill   MPH  2  -  2.5  -  2.5   Grade  1  -  1  -  1   Minutes  15  -  15  -  15   METs  2.81  -  3.26  -  3.26     Recumbant Bike   Level  4  -  -  -  -   Watts  27  -  -  -  -   Minutes  15  -  -  -  -   METs  3.35  -  -  -  -     NuStep   Level  4  -  _0 SPM  -  -  71  70  60   Minutes  15  -  _1 METs  2.8  -  3.1  2.7  2.5     Arm Ergometer   Level  -  -  1.1  1.8  2   RPM  -  -  39  -  -   Minutes  -  -  _2 METs  -  -  1.6  1.8  2      Home Exercise Plan   Plans to continue exercise at  Home (comment) walking  -  -  -  -   Frequency  Add 2 additional days to program exercise sessions.  -  -  -  -   Initial Home Exercises Provided  04/12/17  -  -  -  -   Row Trujillo 08/02/17 1400 08/16/17 1200 08/30/17 1300         Response to Exercise   Blood Pressure (Admit)  124/64  118/52  134/60     Blood Pressure (Exercise)  -  -  154/72     Blood Pressure (Exit)  126/60  106/46  130/62     Heart Rate (Admit)  94 bpm  94 bpm  90 bpm     Heart Rate (Exercise)  116 bpm  124 bpm  119 bpm     Heart Rate (Exit)  110 bpm  103 bpm  105 bpm     Rating of  Perceived Exertion (Exercise)  _0 Perceived Dyspnea (Exercise)  _1 Symptoms  none  none  none     Duration  Continue with 45 min of aerobic exercise without signs/symptoms of physical distress.  Continue with 45 min of aerobic exercise without signs/symptoms of physical distress.  Continue with 45 min of aerobic exercise without signs/symptoms of physical distress.     Intensity  THRR unchanged  THRR unchanged  THRR unchanged       Progression   Progression  Continue to progress workloads to maintain intensity without signs/symptoms of physical distress.  Continue to progress workloads to maintain intensity without signs/symptoms of physical distress.  Continue to progress workloads to maintain intensity without signs/symptoms of physical distress.     Average METs  2.65  2.98  2.9       Resistance Training   Training Prescription  Yes  Yes  Yes     Weight  3 lb  3 lb  5 lb     Reps  10-15  10-15  10-15       Interval Training   Interval Training  -  No  No       Oxygen   Oxygen  -  Continuous  Continuous     Liters  -  2  2       Treadmill   MPH  2.5  2.5  -     Grade  1  1  -     Minutes  15  15  -     METs  3.26  3.26  -       NuStep   Level  5  5  -     SPM  67  69  -     Minutes  15  15  -     METs  2.6  3  -       Arm Ergometer   Level  _2 RPM  -  -  37     Minutes  _3 METs  2.1  2.7  2.9       Home Exercise Plan   Plans to continue exercise at  -  -  Home (comment) walking     Frequency  -  -  Add 2 additional days to program exercise sessions.     Initial Home Exercises Provided  -  -  04/12/17        Exercise Comments: Exercise Comments    Row Trujillo 04/03/17 0912 04/05/17 0956 04/19/17 0810 05/29/17 0758 06/09/17 1103   Exercise Comments  First full day of exercise!  Patient was oriented to gym and equipment including functions, settings, policies, and procedures.  Patient's individual exercise prescription and treatment plan were reviewed.  All starting workloads were established based on the results of the 6 minute walk test done at initial orientation visit.  The plan for exercise progression was also introduced and progression will be customized based on patient's performance and goals  Thomas Trujillo completed an abbreviated exercise session due to  Dr appointment.  Reviewed METs average and discussed progression with pt today.   Thomas Trujillo graduated today from cardiac rehab with 36 sessions completed.  Details of the patient's exercise prescription and what He needs to do in order to continue the prescription and  progress were discussed with patient.  Patient was given a copy of prescription and goals.  Patient verbalized understanding.  Thomas Trujillo plans to continue to exercise by walking at home.  First full day of exercise!  Patient was oriented to gym and equipment including functions, settings, policies, and procedures.  Patient's individual exercise prescription and treatment plan were reviewed.  All starting workloads were established based on the results of the 6 minute walk test done at initial orientation visit.  The plan for exercise progression was also introduced and progression will be customized based on patient's performance and goals.   Thomas Trujillo Trujillo 06/21/17 1129           Exercise Comments  Reviewed home exercise  with pt today.  Pt plans to walk for exercise.  Reviewed THR, pulse, RPE, sign and symptoms, NTG use, and when to call 911 or MD.  Also discussed weather considerations and indoor options.  Pt voiced understanding          Exercise Goals and Review: Exercise Goals    Row Trujillo 03/30/17 1528 05/30/17 1251           Exercise Goals   Increase Physical Activity  Yes  Yes      Intervention  Provide advice, education, support and counseling about physical activity/exercise needs.;Develop an individualized exercise prescription for aerobic and resistive training based on initial evaluation findings, risk stratification, comorbidities and participant's personal goals.  Provide advice, education, support and counseling about physical activity/exercise needs.;Develop an individualized exercise prescription for aerobic and resistive training based on initial evaluation findings, risk stratification, comorbidities and participant's personal goals.      Expected Outcomes  Achievement of increased cardiorespiratory fitness and enhanced flexibility, muscular endurance and strength shown through measurements of functional capacity and personal statement of participant.  Achievement of increased cardiorespiratory fitness and enhanced flexibility, muscular endurance and strength shown through measurements of functional capacity and personal statement of participant.      Increase Strength and Stamina  Yes  Yes      Intervention  Provide advice, education, support and counseling about physical activity/exercise needs.;Develop an individualized exercise prescription for aerobic and resistive training based on initial evaluation findings, risk stratification, comorbidities and participant's personal goals.  Provide advice, education, support and counseling about physical activity/exercise needs.;Develop an individualized exercise prescription for aerobic and resistive training based on initial evaluation findings, risk  stratification, comorbidities and participant's personal goals.      Expected Outcomes  Achievement of increased cardiorespiratory fitness and enhanced flexibility, muscular endurance and strength shown through measurements of functional capacity and personal statement of participant.  Achievement of increased cardiorespiratory fitness and enhanced flexibility, muscular endurance and strength shown through measurements of functional capacity and personal statement of participant.         Exercise Goals Re-Evaluation : Exercise Goals Re-Evaluation    Row Trujillo 04/05/17 1600 04/12/17 0934 04/18/17 1157 05/02/17 1549 05/17/17 0851     Exercise Goal Re-Evaluation   Exercise Goals Review  Increase Physical Activity;Increase Strenth and Stamina  Increase Physical Activity;Increase Strenth and Stamina  Increase Physical Activity;Increase Strenth and Stamina  Increase Physical Activity;Increase Strenth and Stamina  Increase Physical Activity;Increase Strenth and Stamina   Comments  Tylik has completed two exercise days with rehab so far.  He is off to good a start.  We will continue to monitor his progression.  Reviewed home exercise with pt today.  Pt plans to walk at home for exercise.  Reviewed THR, pulse, RPE,  sign and symptoms, and when to call 911 or MD.  Also discussed weather considerations and indoor options.  Pt voiced understanding.  Jahlen is doing well in rehab.  He is now up to 2.0 mph on the treadmill.  We will continue to monitor his progression.  Laken has increased his resistnace on the NuStep.  Staff will continue to monitor progression.  Horace has been doing well in rehab.  He has more strength and stamina and is able to do more at home.  He has also come off his oxygen except for exercise.  Today, he tried the stepper on RA and mainated 88-90%.  The treadmill is still his hardest piece with the weight bearing.   Expected Outcomes  Continue to come to classes to work on strength and stamina.   Short: Add in at least one day at home.  Long: Make exercise part of routine  Short: Increase work load on NuStep.  Long: Continue to exercise regularly.  Short - Continue regular attendance Long - Continue to improve overall functional fitness.  Short: Continue to exercise at home and try to exercise off oxygen.  Long: Continue to exercise independently.   Thomas Trujillo Trujillo 05/24/17 9449 06/21/17 1128 06/21/17 1406 07/05/17 1451 07/19/17 1519     Exercise Goal Re-Evaluation   Exercise Goals Review  Increase Physical Activity;Increase Strenth and Stamina  Increase Physical Activity;Increase Strength and Stamina  Increase Physical Activity;Increase Strength and Stamina;Able to understand and use rate of perceived exertion (RPE) scale;Able to understand and use Dyspnea scale;Knowledge and understanding of Target Heart Rate Range (THRR);Understanding of Exercise Prescription;Able to check pulse independently  Increase Physical Activity;Increase Strength and Stamina  Increase Physical Activity;Increase Strength and Stamina   Comments  Thomas Trujillo improved his walk test by 560 ft!    Thomas Trujillo has been walking 10 min a day at home.  We talked about lengthening the time to 20 - 30 min or doing two 10 min sessions per day.  Home exercise was reviewed with Thomas Trujillo including RPE, THR and safety.  He is walking 10 min at a time at home.  Thomas Trujillo is tolerating exercise well.  He is working to increase intensity on the arm crank.  Thomas Trujillo continues to work at correct RPE levels.  He is motivated to improve fitness.   Expected Outcomes  Short: Graduate  Long: Continue to exercise indpendent  Short - Smith will increase walk time to 20 min at a time.  Long - Nehan will continue to complete 30 min of exercise at home on days he doesnt attend class.  Short - Dshaun will increase walks to 20-30 minutes.  Long - Neils will continue to exercise on his own   Short - Cason will see improvement on the arm crank.  Long - Hervey wil maintain reguar exercise.  SHort -  Dametrius will continue to attend.  Long - Jedediah will maintain fitness on his own.   [place] Trujillo 08/02/17 1410 08/16/17 1213 08/30/17 1326         Exercise Goal Re-Evaluation   Exercise Goals Review  Increase Physical Activity;Increase Strength and Stamina;Able to understand and use rate of perceived exertion (RPE) scale;Able to understand and use Dyspnea scale  Increase Physical Activity;Increase Strength and Stamina  Increase Physical Activity;Increase Strength and Stamina     Comments  Thomas Trujillo is maintaining his MET level.  He attends regularly and has reduced the need for O2 on the NS and arm ergometer.  Thomas Trujillo has steadily increased his resistance  and overall MET level. His MET level is up to 2.98 and his stamina is much better.  Thomas Trujillo has increased to level 5 on AE and improved 6 min walk by 250 feet.       Expected Outcomes  Short - Thomas Trujillo will continue to attend LW.  Long - Thomas Trujillo will maintain exercise on his own.  Short - Thomas Trujillo will continue to attend at least 3 days per week.  Long - Thomas Trujillo will maintain fitness gains after completing the program.  Short - Bartt will finish LW program.  Long - Schneider willmaintain exercise independently.        Discharge Exercise Prescription (Final Exercise Prescription Changes): Exercise Prescription Changes - 08/30/17 1300      Response to Exercise   Blood Pressure (Admit)  134/60    Blood Pressure (Exercise)  154/72    Blood Pressure (Exit)  130/62    Heart Rate (Admit)  90 bpm    Heart Rate (Exercise)  119 bpm    Heart Rate (Exit)  105 bpm    Rating of Perceived Exertion (Exercise)  15    Perceived Dyspnea (Exercise)  3    Symptoms  none    Duration  Continue with 45 min of aerobic exercise without signs/symptoms of physical distress.    Intensity  THRR unchanged      Progression   Progression  Continue to progress workloads to maintain intensity without signs/symptoms of physical distress.    Average METs  2.9      Resistance Training   Training Prescription   Yes    Weight  5 lb    Reps  10-15      Interval Training   Interval Training  No      Oxygen   Oxygen  Continuous    Liters  2      Arm Ergometer   Level  5    RPM  37    Minutes  15    METs  2.9      Home Exercise Plan   Plans to continue exercise at  Home (comment) walking   walking   Frequency  Add 2 additional days to program exercise sessions.    Initial Home Exercises Provided  04/12/17       Nutrition:  Target Goals: Understanding of nutrition guidelines, daily intake of sodium <1527m, cholesterol <2063m calories 30% from fat and 7% or less from saturated fats, daily to have 5 or more servings of fruits and vegetables.  Biometrics: Pre Biometrics - 05/30/17 1251      Pre Biometrics   Height  5' 4.6" (1.641 m)    Weight  150 lb 12.8 oz (68.4 kg)    Waist Circumference  35.5 inches    Hip Circumference  37 inches    Waist to Hip Ratio  0.96 %    BMI (Calculated)  25.5      Post Biometrics - 08/30/17 1046       Post  Biometrics   Height  5' 4.6" (1.641 m)    Weight  152 lb (68.9 kg)    Waist Circumference  34 inches    Hip Circumference  37 inches    Waist to Hip Ratio  0.92 %    BMI (Calculated)  25.6       Nutrition Therapy Plan and Nutrition Goals: Nutrition Therapy & Goals - 08/21/17 1717      Nutrition Therapy   RD appointment defered  Yes  Personal Nutrition Goals   Comments  Tilak declines seeing the dietician.       Nutrition Discharge: Rate Your Plate Scores: Nutrition Assessments - 08/28/17 1114      MEDFICTS Scores   Pre Score  0       Nutrition Goals Re-Evaluation: Nutrition Goals Re-Evaluation    Row Trujillo 04/21/17 0842 05/17/17 0900 07/05/17 1042 08/04/17 1433 08/21/17 1717     Goals   Current Weight  -  -  149 lb (67.6 kg)  151 lb (68.5 kg)  152 lb (68.9 kg)   Nutrition Goal  -  continue with current heart healthy eating pattern, use menus  Continue with current heart healthy eating pattern to maintain his weight.   Continue to eat healthy to maintain his current weight  Maintain current weight and eat a heart healthy diet.   Comment  When I asked Ladarious how his appt went with the Cardiac REhab REgistered Dietician he said it was very helpful. Jamael reports that "he has a Facilities manager from his church that cooks his lunch and dinner for him".   Jondavid has a Facilities manager from church to prepare his meals.  They have continued  to focus on heart healthy eating. He feels he is getting a balanced diet.   Jeremyah's chef is still cooking for him daily. He follows a heart healthy list of foods. He will continue to cook for him until the end of the year.  Clemmie's chef cooking for him daily. He follows a heart healthy list of foods. He will continue to cook for him until the end of the year.  Gawain states they chef that was cooking for him is done now. He says that he is making his meals and using the chefs rubric to prepare them.   Expected Outcome  Heart Healthy eating plus eating for his COPD  Short: Continue to eat balanced diets.  Long: Continue to eat heart healthy diet.  Short: Continue to eat a heart healthy diet. Long: Continue to ear a heart healthy diet.  Short: Continue to eat a heart healthy diet. Long: Continue to ear a heart healthy diet.  Short: eat a healthy diet cooking independently. Long: Maintain weight cooking a healthy diet.      Nutrition Goals Discharge (Final Nutrition Goals Re-Evaluation): Nutrition Goals Re-Evaluation - 08/21/17 1717      Goals   Current Weight  152 lb (68.9 kg)    Nutrition Goal  Maintain current weight and eat a heart healthy diet.    Comment  Om states they chef that was cooking for him is done now. He says that he is making his meals and using the chefs rubric to prepare them.    Expected Outcome  Short: eat a healthy diet cooking independently. Long: Maintain weight cooking a healthy diet.       Psychosocial: Target Goals: Acknowledge presence or absence of significant  depression and/or stress, maximize coping skills, provide positive support system. Participant is able to verbalize types and ability to use techniques and skills needed for reducing stress and depression.   Initial Review & Psychosocial Screening: Initial Psych Review & Screening - 05/30/17 1139      Initial Review   Current issues with  Current Sleep Concerns      Family Dynamics   Good Support System?  Yes       Quality of Life Scores: Quality of Life - 05/22/17 0851      Quality of  Life Scores   Health/Function Pre  23.46 %    Health/Function Post  24.46 %    Health/Function % Change  4.26 %    Socioeconomic Pre  29 %    Socioeconomic Post  27.5 %    Socioeconomic % Change   -5.17 %    Psych/Spiritual Pre  30 %    Psych/Spiritual Post  28.29 %    Psych/Spiritual % Change  -5.7 %    Family Pre  28.5 %    Family Post  28.5 %    Family % Change  0 %    GLOBAL Pre  26.58 %    GLOBAL Post  26.5 %    GLOBAL % Change  -0.3 %       PHQ-9: Recent Review Flowsheet Data    Depression screen [facility] 2/9 08/28/2017 07/26/2017 05/30/2017 05/22/2017 03/30/2017   Decreased Interest 0 0 0 0 2   Down, Depressed, Hopeless 0 0 0 0 0   PHQ - 2 Score 0 0 0 0 2   Altered sleeping 0 0 1 0 3    Tired, decreased energy 0 1 0 0 3    Change in appetite 0 0 0 0 1    Feeling bad or failure about yourself  0 0 0 0 0   Trouble concentrating 0 0 0 0 0   Moving slowly or fidgety/restless 0 0 0 0 0   Suicidal thoughts 0 0 0 0 0   PHQ-9 Score 0 1 1 0 9   Difficult doing work/chores Not difficult at all Not difficult at all Not difficult at all Not difficult at all Not difficult at all     Interpretation of Total Score  Total Score Depression Severity:  1-4 = Minimal depression, 5-9 = Mild depression, 10-14 = Moderate depression, 15-19 = Moderately severe depression, 20-27 = Severe depression   Psychosocial Evaluation and Intervention: Psychosocial Evaluation - 06/28/17 1126      Psychosocial  Evaluation & Interventions   Comments  Follow up with Thomas Trujillo today reporting progress made since coming into this program.  He is using less Oxygen and has increased his intensity on the machines - so increased stamina is being experienced as well.  Nyron states his appetite is good and he continues to sleep better.  He continues to rely on his support system and have minimal stress in his life.  Counselor commended [place] on his progress made.    Expected Outcomes  Paeton will continue to exercise consistently to maintain his progress and increase his stamina and strength.       Psychosocial Re-Evaluation: Psychosocial Re-Evaluation    Row Trujillo 04/21/17 [phone] 07/05/17 1047 08/07/17 1702 08/21/17 1721       Psychosocial Re-Evaluation   Current issues with  Current Sleep Concerns  None Identified  None Identified;Current Stress Concerns  Current Stress Concerns    Comments  Frederic reports that last night he didn't use his oxgyen and he did well. Morey said that he has a Facilities manager from his church cook his lunch and dinner for him and he makes his own breakfast. Montrail's son has bought him to Cardiac REhab since he started.   Johns sleep has been better and averages 7 hours of sleep a night. Len feels like he is more independent since the [employer] and [facility] program. He is driving himself to the program currently.  Ramsay stated that his breathing has not improved much since starting the  program. Informed him that he may need a change in his respiratory medication and to inform his Doctor. Keontre has COPD and heart issues and wants to make sure he can do whatever he can to imporove his breathing to decrease stress.  Torey states that he does not have any stressors other than getting short of breath.    Expected Outcomes  Misael -heart healthy lifestyle.  Short: continue to keep a healthy sleep regimen. Long:Keep resting well to maintain a stress free life.  Short: continue pulmonary rehab to reduce stress.  Long: work independently to keep stress to a minimum  Short: continue pulmonary rehab to reduce stress. Long: work independently to keep stress to a minimum    Interventions  Encouraged to attend Cardiac Rehabilitation for the exercise  Encouraged to attend Pulmonary Rehabilitation for the exercise;Physician referral  Encouraged to attend Pulmonary Rehabilitation for the exercise  Encouraged to attend Pulmonary Rehabilitation for the exercise    Continue Psychosocial Services   Follow up required by staff  Follow up required by staff  Follow up required by staff  Follow up required by staff       Psychosocial Discharge (Final Psychosocial Re-Evaluation): Psychosocial Re-Evaluation - 08/21/17 1721      Psychosocial Re-Evaluation   Current issues with  Current Stress Concerns    Comments  Zachari states that he does not have any stressors other than getting short of breath.    Expected Outcomes  Short: continue pulmonary rehab to reduce stress. Long: work independently to keep stress to a minimum    Interventions  Encouraged to attend Pulmonary Rehabilitation for the exercise    Continue Psychosocial Services   Follow up required by staff       Education: Education Goals: Education classes will be provided on a weekly basis, covering required topics. Participant will state understanding/return demonstration of topics presented.  Learning Barriers/Preferences: Learning Barriers/Preferences - 03/30/17 1552      Learning Barriers/Preferences   Learning Barriers  None    Learning Preferences  Skilled Demonstration;Pictoral;Individual Instruction;Written Material;Video       Education Topics: Initial Evaluation Education: - Verbal, written and demonstration of respiratory meds, RPE/PD scales, oximetry and breathing techniques. Instruction on use of nebulizers and MDIs: cleaning and proper use, rinsing mouth with steroid doses and importance of monitoring MDI activations.   Pulmonary Rehab from  06/14/2017 in [facility] Cardiac and Pulmonary Rehab  Date  05/30/17  Educator  [facility]  Instruction Review Code (retired)  2- meets goals/outcomes      General Nutrition Guidelines/Fats and Fiber: -Group instruction provided by verbal, written material, models and posters to present the general guidelines for heart healthy nutrition. Gives an explanation and review of dietary fats and fiber.   Pulmonary Rehab from 08/30/2017 in [facility] Cardiac and Pulmonary Rehab  Date  08/07/17  Educator  CR  Instruction Review Code  5- Refused Teaching [already taken]      Controlling Sodium/Reading Food Labels: -Group verbal and written material supporting the discussion of sodium use in heart healthy nutrition. Review and explanation with models, verbal and written materials for utilization of the food label.   Pulmonary Rehab from 08/30/2017 in [facility] Cardiac and Pulmonary Rehab  Date  08/14/17  Educator  CR  Instruction Review Code  5- Refused Teaching      Exercise Physiology & Risk Factors: - Group verbal and written instruction with models to review the exercise physiology of the cardiovascular system and associated critical values. Details cardiovascular disease  risk factors and the goals associated with each risk factor.   Pulmonary Rehab from 08/30/2017 in [facility] Cardiac and Pulmonary Rehab  Date  07/28/17  Educator  Nada Maclachlan, EP  Instruction Review Code  1- Verbalizes Understanding      Aerobic Exercise & Resistance Training: - Gives group verbal and written discussion on the health impact of inactivity. On the components of aerobic and resistive training programs and the benefits of this training and how to safely progress through these programs.   Cardiac Rehab from 05/29/2017 in [facility] Cardiac and Pulmonary Rehab  Date  05/03/17  Educator  SB  Instruction Review Code (retired)  2- Statistician, Balance, General Exercise Guidelines: - Provides group verbal and written  instruction on the benefits of flexibility and balance training programs. Provides general exercise guidelines with specific guidelines to those with heart or lung disease. Demonstration and skill practice provided.   Cardiac Rehab from 05/29/2017 in [facility] Cardiac and Pulmonary Rehab  Date  05/08/17  Educator  [facility]  Instruction Review Code (retired)  2- meets goals/outcomes      Stress Management: - Provides group verbal and written instruction about the health risks of elevated stress, cause of high stress, and healthy ways to reduce stress.   Cardiac Rehab from 05/29/2017 in [facility] Cardiac and Pulmonary Rehab  Date  05/17/17  Educator  [facility]  Instruction Review Code (retired)  2- meets goals/outcomes      Depression: - Provides group verbal and written instruction on the correlation between heart/lung disease and depressed mood, treatment options, and the stigmas associated with seeking treatment.   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   Pulmonary Rehab from 06/14/2017 in [facility] Cardiac and Pulmonary Rehab  Date  05/30/17  Educator  [facility]  Instruction Review Code (retired)  2- meets goals/outcomes      Infection Prevention: - Provides verbal and written material to individual with discussion of infection control including proper hand washing and proper equipment cleaning during exercise session.   Pulmonary Rehab from 06/14/2017 in [facility] Cardiac and Pulmonary Rehab  Date  05/30/17  Educator  [facility]  Instruction Review Code (retired)  2- meets [employer] Prevention: - Provides verbal and written material to individual with discussion of falls prevention and safety.   Pulmonary Rehab from 06/14/2017 in [facility] Cardiac and Pulmonary Rehab  Date   05/30/17  Educator  [facility]  Instruction Review Code (retired)  2- meets goals/outcomes      Diabetes: - Individual verbal and written instruction to review signs/symptoms of diabetes, desired ranges of glucose level fasting, after meals and with exercise. Advice that pre and post exercise glucose checks will be done for 3 sessions at entry of program.   Chronic Lung Diseases: - Group verbal and written instruction to review new updates, new respiratory medications, new advancements in procedures and treatments. Provide informative websites and "800" numbers of self-education.   Pulmonary Rehab from 08/30/2017 in [facility] Cardiac and Pulmonary Rehab  Date  07/26/17  Educator  [facility]  Instruction Review Code  1- Verbalizes Understanding      Lung Procedures: - Group verbal and written instruction to describe testing methods done to diagnose lung disease.  Review the outcome of test results. Describe the treatment choices: Pulmonary Function Tests, ABGs and oximetry.   Energy Conservation: - Provide group verbal and written instruction for methods to conserve energy, plan and organize activities. Instruct on pacing techniques, use of adaptive equipment and posture/positioning to relieve shortness of breath.   Pulmonary Rehab from 08/30/2017 in [facility] Cardiac and Pulmonary Rehab  Date  07/05/17  Educator  [facility]  Instruction Review Code  1- Verbalizes Understanding      Triggers: - Group verbal and written instruction to review types of environmental controls: home humidity, furnaces, filters, dust mite/pet prevention, HEPA vacuums. To discuss weather changes, air quality and the benefits of nasal washing.   Pulmonary Rehab from 08/30/2017 in [facility] Cardiac and Pulmonary Rehab  Date  08/30/17  Educator  [facility]  Instruction Review Code  1- Verbalizes Understanding      Exacerbations: - Group verbal and written instruction to provide: warning signs, infection symptoms, calling MD promptly, preventive modes,  and value of vaccinations. Review: effective airway clearance, coughing and/or vibration techniques. Create an Sports administrator.   Pulmonary Rehab from 08/30/2017 in [facility] Cardiac and Pulmonary Rehab  Date  08/30/17  Educator  [facility]  Instruction Review Code  1- Verbalizes Understanding      Oxygen: - Individual and group verbal and written instruction on oxygen therapy. Includes supplement oxygen, available portable oxygen systems, continuous and intermittent flow rates, oxygen safety, concentrators, and Medicare reimbursement for oxygen.   Respiratory Medications: - Group verbal and written instruction to review medications for lung disease. Drug class, frequency, complications, importance of spacers, rinsing mouth after steroid MDI's, and proper cleaning methods for nebulizers.   AED/CPR: - Group verbal and written instruction with the use of models to demonstrate the basic use of the AED with the basic ABC's of resuscitation.   Pulmonary Rehab from 08/30/2017 in [facility] Cardiac and Pulmonary Rehab  Date  07/14/17  Educator  CE  Instruction Review Code  1- Verbalizes Understanding      Breathing Retraining: - Provides individuals verbal and written instruction on purpose, frequency, and proper technique of diaphragmatic breathing and pursed-lipped breathing. Applies individual practice skills.   Pulmonary Rehab from 06/14/2017 in [facility] Cardiac and Pulmonary Rehab  Date  05/30/17  Educator  [facility]  Instruction Review Code (retired)  2- Lawyer and Physiology of the Lungs: - Group verbal and written instruction with the use of models to provide basic lung anatomy and physiology related to function, structure and complications of lung disease.   Pulmonary Rehab from 08/30/2017 in [facility] Cardiac and Pulmonary Rehab  Date  06/28/17  Educator  [facility]  Instruction Review Code  1- Verbalizes Understanding      Anatomy & Physiology of the Heart: - Group verbal and written  instruction and models provide basic cardiac anatomy and physiology, with the coronary electrical and arterial systems. Review of: AMI, Angina, Valve disease, Heart Failure, Cardiac Arrhythmia, Pacemakers, and the ICD.   Cardiac Rehab from 05/29/2017 in [facility] Cardiac and Pulmonary Rehab  Date  05/15/17  Educator  CE  Instruction Review Code (retired)  2- meets goals/outcomes      Heart Failure: - Group verbal and written instruction on the basics of heart failure: signs/symptoms, treatments, explanation of ejection fraction, enlarged heart and cardiomyopathy.   Sleep Apnea: - Individual verbal and written instruction to review Obstructive Sleep Apnea. Review of risk factors, methods for diagnosing and types of masks and machines for  OSA.   Pulmonary Rehab from 08/30/2017 in [facility] Cardiac and Pulmonary Rehab  Date  08/30/17  Educator  [facility]  Instruction Review Code  1- Verbalizes Understanding      Anxiety: - Provides group, verbal and written instruction on the correlation between heart/lung disease and anxiety, treatment options, and management of anxiety.   Relaxation: - Provides group, verbal and written instruction about the benefits of relaxation for patients with heart/lung disease. Also provides patients with examples of relaxation techniques.   Pulmonary Rehab from 08/30/2017 in [facility] Cardiac and Pulmonary Rehab  Date  08/09/17  Educator  [facility]  Instruction Review Code  1- Verbalizes Understanding      Cardiac Medications: - Group verbal and written instruction to review commonly prescribed medications for heart disease. Reviews the medication, class of the drug, and side effects.   Cardiac Rehab from 05/29/2017 in [facility] Cardiac and Pulmonary Rehab  Date  05/29/17 [05/29/17 Part 1]  Educator  CE  Instruction Review Code (retired)  2- meets goals/outcomes      Know Your Numbers: -Group verbal and written instruction about important numbers in your health.  Review of Cholesterol,  Blood Pressure, Diabetes, and BMI and the role they play in your overall health.   Pulmonary Rehab from 08/30/2017 in [facility] Cardiac and Pulmonary Rehab  Date  08/04/17  Educator  [facility]  Instruction Review Code  1- Verbalizes Understanding      Other: -Provides group and verbal instruction on various topics (see comments)    Knowledge Questionnaire Score: Knowledge Questionnaire Score - 08/28/17 1115      Knowledge Questionnaire Score   Pre Score  9/10    Post Score  8/10 reviewed with patient   reviewed with patient       Core Components/Risk Factors/Patient Goals at Admission: Personal Goals and Risk Factors at Admission - 05/30/17 1126      Core Components/Risk Factors/Patient Goals on Admission    Weight Management  Yes    Intervention  Weight Management: Develop a combined nutrition and exercise program designed to reach desired caloric intake, while maintaining appropriate intake of nutrient and fiber, sodium and fats, and appropriate energy expenditure required for the weight goal.;Weight Management: Provide education and appropriate resources to help participant work on and attain dietary goals.;Weight Management/Obesity: Establish reasonable short term and long term weight goals.    Admit Weight  150 lb (68 kg)    Goal Weight: Short Term  150 lb (68 kg)    Goal Weight: Long Term  150 lb (68 kg) wants to maintain his current weight   wants to maintain his current weight   Expected Outcomes  Short Term: Continue to assess and modify interventions until short term weight is achieved;Long Term: Adherence to nutrition and physical activity/exercise program aimed toward attainment of established weight goal;Weight Maintenance: Understanding of the daily nutrition guidelines, which includes 25-35% calories from fat, 7% or less cal from saturated fats, less than 272m cholesterol, less than 1.5gm of sodium, & 5 or more servings of fruits and vegetables daily;Understanding recommendations  for meals to include 15-35% energy as protein, 25-35% energy from fat, 35-60% energy from carbohydrates, less than 2066mof dietary cholesterol, 20-35 gm of total fiber daily;Understanding of distribution of calorie intake throughout the day with the consumption of 4-5 meals/snacks    Improve shortness of breath with ADL's  Yes    Intervention  Provide education, individualized exercise plan and daily activity instruction to help decrease symptoms of SOB with  activities of daily living.    Expected Outcomes  Short Term: Achieves a reduction of symptoms when performing activities of daily living.    Develop more efficient breathing techniques such as purse lipped breathing and diaphragmatic breathing; and practicing self-pacing with activity  Yes    Intervention  Provide education, demonstration and support about specific breathing techniuqes utilized for more efficient breathing. Include techniques such as pursed lipped breathing, diaphragmatic breathing and self-pacing activity.    Expected Outcomes  Short Term: Participant will be able to demonstrate and use breathing techniques as needed throughout daily activities.    Increase knowledge of respiratory medications and ability to use respiratory devices properly   Yes    Intervention  Provide education and demonstration as needed of appropriate use of medications, inhalers, and oxygen therapy.    Expected Outcomes  Short Term: Achieves understanding of medications use. Understands that oxygen is a medication prescribed by physician. Demonstrates appropriate use of inhaler and oxygen therapy.    Heart Failure  Yes    Intervention  Provide a combined exercise and nutrition program that is supplemented with education, support and counseling about heart failure. Directed toward relieving symptoms such as shortness of breath, decreased exercise tolerance, and extremity edema.    Expected Outcomes  Improve functional capacity of life;Short term: Attendance  in program 2-3 days a week with increased exercise capacity. Reported lower sodium intake. Reported increased fruit and vegetable intake. Reports medication compliance.;Short term: Daily weights obtained and reported for increase. Utilizing diuretic protocols set by physician.;Long term: Adoption of self-care skills and reduction of barriers for early signs and symptoms recognition and intervention leading to self-care maintenance.    Lipids  Yes    Intervention  Provide education and support for participant on nutrition & aerobic/resistive exercise along with prescribed medications to achieve LDL <35m, HDL >456m    Expected Outcomes  Short Term: Participant states understanding of desired cholesterol values and is compliant with medications prescribed. Participant is following exercise prescription and nutrition guidelines.;Long Term: Cholesterol controlled with medications as prescribed, with individualized exercise RX and with personalized nutrition plan. Value goals: LDL < 7050mHDL > 40 mg.    Personal Goal Other  Yes    Personal Goal  Increase upper body strength    Expected Outcomes  Short: start pulmonary rehab. Long: To have increased upper body strength to increase his ADL       Core Components/Risk Factors/Patient Goals Review:  Goals and Risk Factor Review    Row Trujillo 04/21/17 0849 05/17/17 0856 07/05/17 1027 08/07/17 1655 08/21/17 1709     Core Components/Risk Factors/Patient Goals Review   Personal Goals Review  Lipids  Lipids;Weight Management/Obesity;Improve shortness of breath with ADL's  Weight Management/Obesity;Improve shortness of breath with ADL's;Lipids  Weight Management/Obesity;Improve shortness of breath with ADL's;Lipids  Weight Management/Obesity;Improve shortness of breath with ADL's;Lipids   Review  JohTarquinports that his "personal chef has all the heart healthy rules and diets and he cooks all my lunches and dinners for me". Boleslaus reports that he makes his own breakfast  and eats healty. Riese gets his lipids checked by his MD.   JohNikoliss not had any problems with his medications.  He is able to do more at home now without getting as short of breath.  He is trying to come off of his oxygen some.  His weight has been steady.  Kimm states his lipids have been good. No medication changes. His weight has been maintained since the  start of the program around 150lbs. Shortness of breath has improved slightly.  Tayte has maintained a healthy weight since the beginning of [facility]. He states his shortness of breath seems to be the same and sometimes worse.   Kutler has maintained a healthy weight since the start of the program. His shotness of breath is ok when he is sitting down but the treadmill makes him really short of breath. His lipids have been under control.    Expected Outcomes  Heart healthy living will using 2 liters of oxgyen currently since his surgeyr.  Short: Continue to work on improving his SOB with activities.  Long: Continue to work on risk factor modifications.  Short: work on increaseing levels with machines to improve shortness of breath. Long: Increase levels on all machines.   Short: Improve ADL'S with medication or exercise changes. Long: Maintain ADL's independently  Short: Work on PLB to improve ADL's. Long: Be independent with PLB to do ADL.      Core Components/Risk Factors/Patient Goals at Discharge (Final Review):  Goals and Risk Factor Review - 08/21/17 1709      Core Components/Risk Factors/Patient Goals Review   Personal Goals Review  Weight Management/Obesity;Improve shortness of breath with ADL's;Lipids    Review  Pookela has maintained a healthy weight since the start of the program. His shotness of breath is ok when he is sitting down but the treadmill makes him really short of breath. His lipids have been under control.     Expected Outcomes  Short: Work on PLB to improve ADL's. Long: Be independent with PLB to do ADL.       ITP Comments: ITP  Comments    Row Trujillo 03/30/17 1537 04/07/17 0857 04/21/17 0836 05/30/17 1244 06/19/17 0825   ITP Comments  Medical review completed, INitial ITP created   Documentation of Diagnosis can be found in [facility] 5/4 admission  Uses oxgyen 2 liters/minute.   Adonte arrives on 2 liters of oxgyen that he reports he has had since surgery this year. Amadeus said he didn't use his oxgyen to sleep last night. He reported that he shut off his oxgyen for a few minutes while riding the bicycle in Cardiac Rehab today.   Met with Dr. Sabra Heck director of [place] for face to face. Chart reviewed and signed. Initial ITP sent. Diagnosis can be found in [facility] encounter 05/30/2017  30 day review completed. ITP sent to Dr. Emily Filbert Director of [place]. Continue with ITP unless changes are made by physician.     Row Trujillo 06/30/17 1054 07/03/17 1237 07/05/17 1051 07/17/17 0829 07/31/17 1151   ITP Comments  Tyheem did not show today and missed face to face meeting with Dr. Sabra Heck.   We will take him to meet with Dr. Sabra Heck at next visit.   Doctor met with Doctor Emily Filbert director of [place] for face to face. Chart signed and reviewed.  Mychael feels like he is more independent since the [employer] and [facility] program. He is driving himself to the program currently.  30 day review completed. ITP sent to Dr. Emily Filbert Director of [place]. Continue with ITP unless changes are made by physician.    Dr. Caryl Comes signed for Dr. Emily Filbert director of [place] for face to face, review and changes.   Clayton Trujillo 08/04/17 1145 08/14/17 0821 08/30/17 1047 09/04/17 1011     ITP Comments  Quasim has started intervals today.  30 day review completed. ITP sent to Dr. Emily Filbert  Director of [place]. Continue with ITP unless changes are made by physician.    Pt taken to meet with Dr. Emily Filbert, Medical Director, for face to face meeting today.  Chart reviewed and signed.   Discharge ITP sent and signed by Dr. Sabra Heck.  Discharge Summary routed to  PCP and pulmonologist.       Comments: Discharge ITP

## 2017-09-04 NOTE — Progress Notes (Signed)
Discharge Progress Report  Patient Details  Name: Thomas Trujillo MRN: [id number] Date of Birth: [birth year] Referring Provider:     Pulmonary Rehab from 05/30/2017 in [facility]  Referring Provider  Ancil Linsey MD       Number of Visits: 36/36  Reason for Discharge:  Patient reached a stable level of exercise. Patient independent in their exercise. Patient has met program and personal goals.  Smoking History:  Social History   Tobacco Use  Smoking Status Former Smoker  . Last attempt to quit: 04/06/2007  . Years since quitting: 10.4  Smokeless Tobacco Never Used    Diagnosis:  Chronic obstructive pulmonary disease, unspecified COPD type ([place])  ADL [facility]: Pulmonary Assessment Scores    Row Name 05/30/17 1128 07/21/17 1018 08/28/17 1110     ADL [facility]   ADL Phase  Entry  Mid  Exit   SOB Score total  38  41  36   Rest  0  0  0   Walk  _0 Stairs  _1 Bath  _2 Dress  1  0  1   Shop  _3 CAT Score   CAT Score  22  -  21     mMRC Score   mMRC Score  1  -  -      Initial Exercise Prescription: Initial Exercise Prescription - 05/30/17 1200      Date of Initial Exercise RX and Referring Provider   Date  05/30/17    Referring Provider  Ancil Linsey MD      Oxygen   Oxygen  Continuous Primarily on treadmill   Primarily on treadmill   Liters  2      Treadmill   MPH  2    Grade  1    Minutes  15    METs  2.81      NuStep   Level  4    SPM  80    Minutes  15    METs  2.8      Arm Ergometer   Level  3    RPM  35    Minutes  15    METs  2.8      Resistance Training   Training Prescription  Yes    Weight  3 lb    Reps  10-15       Discharge Exercise Prescription (Final Exercise Prescription Changes): Exercise Prescription Changes - 08/30/17 1300      Response to Exercise   Blood Pressure (Admit)  134/60    Blood Pressure (Exercise)  154/72    Blood Pressure (Exit)  130/62    Heart Rate  (Admit)  90 bpm    Heart Rate (Exercise)  119 bpm    Heart Rate (Exit)  105 bpm    Rating of Perceived Exertion (Exercise)  15    Perceived Dyspnea (Exercise)  3    Symptoms  none    Duration  Continue with 45 min of aerobic exercise without signs/symptoms of physical distress.    Intensity  THRR unchanged      Progression   Progression  Continue to progress workloads to maintain intensity without signs/symptoms of physical distress.    Average METs  2.9      Resistance Training   Training Prescription  Yes    Weight  5 lb    Reps  10-15      Interval Training   Interval Training  No      Oxygen   Oxygen  Continuous    Liters  2      Arm Ergometer   Level  5    RPM  37    Minutes  15    METs  2.9      Home Exercise Plan   Plans to continue exercise at  Home (comment) walking   walking   Frequency  Add 2 additional days to program exercise sessions.    Initial Home Exercises Provided  04/12/17       Functional Capacity: 6 Minute Walk    Row Name 03/30/17 1528 05/24/17 0835 05/24/17 0839     6 Minute Walk   Phase  -  Discharge  -   Distance  810 feet  1370 feet  -   Distance % Change  -  69.1 % 560 ft  -   Walk Time  4.5 minutes  6 minutes  -   # of Rest Breaks  _0 sec  -   MPH  2.04  2.69  -   METS  2.6  3.32  -   RPE  17  13  -   Perceived Dyspnea   3  3  -   VO2 Peak  9.11  11.61  -   Symptoms  No  Yes (comment)  -   Comments  -  SOB, started on Room Air but changed to 2L at 4 min after saturations had dropped to 86%.  -   Resting HR  106 bpm  95 bpm  -   Resting BP  102/56  124/60  -   Max Ex. HR  126 bpm  110 bpm  -   Max Ex. BP  162/60  154/70  -   2 Minute Post BP  112/58  -  -     Interval Oxygen   Interval Oxygen?  Yes  -  -   Baseline Oxygen Saturation %  97 %  -  98 %   Resting Liters of Oxygen  2 L  -  0 L Room Air   1 Minute Oxygen Saturation %  96 %  -  -   1 Minute Liters of Oxygen  2 L  -  -   2 Minute Oxygen Saturation %  94 %  -   -   2 Minute Liters of Oxygen  2 L  -  -   3 Minute Liters of Oxygen  2 L  -  -   4 Minute Oxygen Saturation %  93 %  -  86 %   4 Minute Liters of Oxygen  2 L  -  0 L   5 Minute Liters of Oxygen  2 L  -  -   6 Minute Oxygen Saturation %  94 %  -  92 %   6 Minute Liters of Oxygen  2 L  -  2 L   2 Minute Post Liters of Oxygen  2 L  -  -   Row Name 05/30/17 1243 08/30/17 1045       6 Minute Walk   Phase  Initial  Discharge    Distance  340 feet  1200 feet    Distance % Change  -  252.9 %    Distance Feet Change  -  860 ft    Walk Time  1.75 minutes  6 minutes    # of Rest Breaks  2 2:11 and 2:06  0    MPH  2.25  2.27    METS  1.42  3.14    RPE  15  15    Perceived Dyspnea   4  3    VO2 Peak  4.98  10.9    Symptoms  Yes (comment)  Yes (comment)    Comments  SOB, fatigue, tight breathing, felt like coming down with cold.  Oxygen saturations dropped once seated both time.  SOB    Resting HR  88 bpm  102 bpm    Resting BP  134/70  134/60    Resting Oxygen Saturation   -  99 %    Exercise Oxygen Saturation  during 6 min walk  -  89 %    Max Ex. HR  99 bpm  119 bpm    Max Ex. BP  150/68  154/72    2 Minute Post BP  126/64  136/64      Interval HR   Baseline HR (retired)  88  -    1 Minute HR  95  104    2 Minute HR  86  117    3 Minute HR  89  119    4 Minute HR  99  114    5 Minute HR  87  114    6 Minute HR  86  116    2 Minute Post HR  88  102    Interval Heart Rate?  Yes  Yes      Interval Oxygen   Interval Oxygen?  Yes  Yes    Baseline Oxygen Saturation %  91 %  99 %    Resting Liters of Oxygen  2 L  -    1 Minute Oxygen Saturation %  93 %  99 %    1 Minute Liters of Oxygen  2 L  2 L    2 Minute Oxygen Saturation %  84 % seated rest from 0:48-2:59  97 %    2 Minute Liters of Oxygen  2 L  2 L    3 Minute Oxygen Saturation %  91 %  93 %    3 Minute Liters of Oxygen  2 L  2 L    4 Minute Oxygen Saturation %  92 % seated stopped at 3:54  91 %    4 Minute Liters of  Oxygen  2 L  2 L    5 Minute Oxygen Saturation %  85 %  91 %    5 Minute Liters of Oxygen  2 L  2 L    6 Minute Oxygen Saturation %  91 %  90 % 6:06 89%    6 Minute Liters of Oxygen  2 L  2 L    2 Minute Post Oxygen Saturation %  95 %  97 %    2 Minute Post Liters of Oxygen  2 L  2 L       Psychological, QOL, Others - Outcomes: PHQ 2/9: Depression screen [facility] 2/9 08/28/2017 07/26/2017 05/30/2017 05/22/2017 03/30/2017  Decreased Interest 0 0 0 0 2  Down, Depressed, Hopeless 0 0 0 0 0  PHQ - 2 Score 0 0 0 0 2  Altered sleeping 0 0 1 0 3  Tired, decreased energy 0 1 0 0 3  Change  in appetite 0 0 0 0 1  Feeling bad or failure about yourself  0 0 0 0 0  Trouble concentrating 0 0 0 0 0  Moving slowly or fidgety/restless 0 0 0 0 0  Suicidal thoughts 0 0 0 0 0  PHQ-9 Score 0 1 1 0 9  Difficult doing work/chores Not difficult at all Not difficult at all Not difficult at all Not difficult at all Not difficult at all    Quality of Life: Quality of Life - 05/22/17 0851      Quality of Life Scores   Health/Function Pre  23.46 %    Health/Function Post  24.46 %    Health/Function % Change  4.26 %    Socioeconomic Pre  29 %    Socioeconomic Post  27.5 %    Socioeconomic % Change   -5.17 %    Psych/Spiritual Pre  30 %    Psych/Spiritual Post  28.29 %    Psych/Spiritual % Change  -5.7 %    Family Pre  28.5 %    Family Post  28.5 %    Family % Change  0 %    GLOBAL Pre  26.58 %    GLOBAL Post  26.5 %    GLOBAL % Change  -0.3 %       Personal Goals: Goals established at orientation with interventions provided to work toward goal. Personal Goals and Risk Factors at Admission - 05/30/17 1126      Core Components/Risk Factors/Patient Goals on Admission    Weight Management  Yes    Intervention  Weight Management: Develop a combined nutrition and exercise program designed to reach desired caloric intake, while maintaining appropriate intake of nutrient and fiber, sodium and fats, and  appropriate energy expenditure required for the weight goal.;Weight Management: Provide education and appropriate resources to help participant work on and attain dietary goals.;Weight Management/Obesity: Establish reasonable short term and long term weight goals.    Admit Weight  150 lb (68 kg)    Goal Weight: Short Term  150 lb (68 kg)    Goal Weight: Long Term  150 lb (68 kg) wants to maintain his current weight   wants to maintain his current weight   Expected Outcomes  Short Term: Continue to assess and modify interventions until short term weight is achieved;Long Term: Adherence to nutrition and physical activity/exercise program aimed toward attainment of established weight goal;Weight Maintenance: Understanding of the daily nutrition guidelines, which includes 25-35% calories from fat, 7% or less cal from saturated fats, less than 265m cholesterol, less than 1.5gm of sodium, & 5 or more servings of fruits and vegetables daily;Understanding recommendations for meals to include 15-35% energy as protein, 25-35% energy from fat, 35-60% energy from carbohydrates, less than 2060mof dietary cholesterol, 20-35 gm of total fiber daily;Understanding of distribution of calorie intake throughout the day with the consumption of 4-5 meals/snacks    Improve shortness of breath with ADL's  Yes    Intervention  Provide education, individualized exercise plan and daily activity instruction to help decrease symptoms of SOB with activities of daily living.    Expected Outcomes  Short Term: Achieves a reduction of symptoms when performing activities of daily living.    Develop more efficient breathing techniques such as purse lipped breathing and diaphragmatic breathing; and practicing self-pacing with activity  Yes    Intervention  Provide education, demonstration and support about specific breathing techniuqes utilized for more efficient breathing. Include techniques  such as pursed lipped breathing, diaphragmatic  breathing and self-pacing activity.    Expected Outcomes  Short Term: Participant will be able to demonstrate and use breathing techniques as needed throughout daily activities.    Increase knowledge of respiratory medications and ability to use respiratory devices properly   Yes    Intervention  Provide education and demonstration as needed of appropriate use of medications, inhalers, and oxygen therapy.    Expected Outcomes  Short Term: Achieves understanding of medications use. Understands that oxygen is a medication prescribed by physician. Demonstrates appropriate use of inhaler and oxygen therapy.    Heart Failure  Yes    Intervention  Provide a combined exercise and nutrition program that is supplemented with education, support and counseling about heart failure. Directed toward relieving symptoms such as shortness of breath, decreased exercise tolerance, and extremity edema.    Expected Outcomes  Improve functional capacity of life;Short term: Attendance in program 2-3 days a week with increased exercise capacity. Reported lower sodium intake. Reported increased fruit and vegetable intake. Reports medication compliance.;Short term: Daily weights obtained and reported for increase. Utilizing diuretic protocols set by physician.;Long term: Adoption of self-care skills and reduction of barriers for early signs and symptoms recognition and intervention leading to self-care maintenance.    Lipids  Yes    Intervention  Provide education and support for participant on nutrition & aerobic/resistive exercise along with prescribed medications to achieve LDL <70m, HDL >417m    Expected Outcomes  Short Term: Participant states understanding of desired cholesterol values and is compliant with medications prescribed. Participant is following exercise prescription and nutrition guidelines.;Long Term: Cholesterol controlled with medications as prescribed, with individualized exercise RX and with personalized  nutrition plan. Value goals: LDL < 7087mHDL > 40 mg.    Personal Goal Other  Yes    Personal Goal  Increase upper body strength    Expected Outcomes  Short: start pulmonary rehab. Long: To have increased upper body strength to increase his ADL        Personal Goals Discharge: Goals and Risk Factor Review    Row Name 04/21/17 0849 05/17/17 0856 07/05/17 1027 08/07/17 1655 08/21/17 1709     Core Components/Risk Factors/Patient Goals Review   Personal Goals Review  Lipids  Lipids;Weight Management/Obesity;Improve shortness of breath with ADL's  Weight Management/Obesity;Improve shortness of breath with ADL's;Lipids  Weight Management/Obesity;Improve shortness of breath with ADL's;Lipids  Weight Management/Obesity;Improve shortness of breath with ADL's;Lipids   Review  JohChathamports that his "personal chef has all the heart healthy rules and diets and he cooks all my lunches and dinners for me". Ellijah reports that he makes his own breakfast and eats healty. Mazen gets his lipids checked by his MD.   JohBrandonns not had any problems with his medications.  He is able to do more at home now without getting as short of breath.  He is trying to come off of his oxygen some.  His weight has been steady.  Hani states his lipids have been good. No medication changes. His weight has been maintained since the start of the program around 150lbs. Shortness of breath has improved slightly.  Kalijah has maintained a healthy weight since the beginning of [facility]. He states his shortness of breath seems to be the same and sometimes worse.   Ilia has maintained a healthy weight since the start of the program. His shotness of breath is ok when he is sitting down but the treadmill makes him really  short of breath. His lipids have been under control.    Expected Outcomes  Heart healthy living will using 2 liters of oxgyen currently since his surgeyr.  Short: Continue to work on improving his SOB with activities.  Long: Continue to  work on risk factor modifications.  Short: work on increaseing levels with machines to improve shortness of breath. Long: Increase levels on all machines.   Short: Improve ADL'S with medication or exercise changes. Long: Maintain ADL's independently  Short: Work on PLB to improve ADL's. Long: Be independent with PLB to do ADL.      Exercise Goals and Review: Exercise Goals    Row Name 03/30/17 1528 05/30/17 1251           Exercise Goals   Increase Physical Activity  Yes  Yes      Intervention  Provide advice, education, support and counseling about physical activity/exercise needs.;Develop an individualized exercise prescription for aerobic and resistive training based on initial evaluation findings, risk stratification, comorbidities and participant's personal goals.  Provide advice, education, support and counseling about physical activity/exercise needs.;Develop an individualized exercise prescription for aerobic and resistive training based on initial evaluation findings, risk stratification, comorbidities and participant's personal goals.      Expected Outcomes  Achievement of increased cardiorespiratory fitness and enhanced flexibility, muscular endurance and strength shown through measurements of functional capacity and personal statement of participant.  Achievement of increased cardiorespiratory fitness and enhanced flexibility, muscular endurance and strength shown through measurements of functional capacity and personal statement of participant.      Increase Strength and Stamina  Yes  Yes      Intervention  Provide advice, education, support and counseling about physical activity/exercise needs.;Develop an individualized exercise prescription for aerobic and resistive training based on initial evaluation findings, risk stratification, comorbidities and participant's personal goals.  Provide advice, education, support and counseling about physical activity/exercise needs.;Develop an  individualized exercise prescription for aerobic and resistive training based on initial evaluation findings, risk stratification, comorbidities and participant's personal goals.      Expected Outcomes  Achievement of increased cardiorespiratory fitness and enhanced flexibility, muscular endurance and strength shown through measurements of functional capacity and personal statement of participant.  Achievement of increased cardiorespiratory fitness and enhanced flexibility, muscular endurance and strength shown through measurements of functional capacity and personal statement of participant.         Nutrition & Weight - Outcomes: Pre Biometrics - 05/30/17 1251      Pre Biometrics   Height  5' 4.6" (1.641 m)    Weight  150 lb 12.8 oz (68.4 kg)    Waist Circumference  35.5 inches    Hip Circumference  37 inches    Waist to Hip Ratio  0.96 %    BMI (Calculated)  25.5      Post Biometrics - 08/30/17 1046       Post  Biometrics   Height  5' 4.6" (1.641 m)    Weight  152 lb (68.9 kg)    Waist Circumference  34 inches    Hip Circumference  37 inches    Waist to Hip Ratio  0.92 %    BMI (Calculated)  25.6       Nutrition: Nutrition Therapy & Goals - 08/21/17 1717      Nutrition Therapy   RD appointment defered  Yes      Personal Nutrition Goals   Comments  Nima declines seeing the dietician.  Nutrition Discharge: Nutrition Assessments - 08/28/17 1114      MEDFICTS Scores   Pre Score  0       Education Questionnaire Score: Knowledge Questionnaire Score - 08/28/17 1115      Knowledge Questionnaire Score   Pre Score  9/10    Post Score  8/10 reviewed with patient   reviewed with patient      Goals reviewed with patient; copy given to patient.

## 2017-09-04 NOTE — Patient Instructions (Signed)
Discharge Instructions  Patient Details  Name: Thomas Trujillo MRN: [id number] Date of Birth: 06-17-1946 Referring Provider:  Erby Pian, MD   Number of Visits: 36/36  Reason for Discharge:  Patient reached a stable level of exercise. Patient independent in their exercise. Patient has met program and personal goals.  Smoking History:  Social History   Tobacco Use  Smoking Status Former Smoker  . Last attempt to quit: 04/06/2007  . Years since quitting: 10.4  Smokeless Tobacco Never Used    Diagnosis:  Chronic obstructive pulmonary disease, unspecified COPD type ([place])  Initial Exercise Prescription: Initial Exercise Prescription - 05/30/17 1200      Date of Initial Exercise RX and Referring Provider   Date  05/30/17    Referring Provider  Ancil Linsey MD      Oxygen   Oxygen  Continuous Primarily on treadmill   Primarily on treadmill   Liters  2      Treadmill   MPH  2    Grade  1    Minutes  15    METs  2.81      NuStep   Level  4    SPM  80    Minutes  15    METs  2.8      Arm Ergometer   Level  3    RPM  35    Minutes  15    METs  2.8      Resistance Training   Training Prescription  Yes    Weight  3 lb    Reps  10-15       Discharge Exercise Prescription (Final Exercise Prescription Changes): Exercise Prescription Changes - 08/30/17 1300      Response to Exercise   Blood Pressure (Admit)  134/60    Blood Pressure (Exercise)  154/72    Blood Pressure (Exit)  130/62    Heart Rate (Admit)  90 bpm    Heart Rate (Exercise)  119 bpm    Heart Rate (Exit)  105 bpm    Rating of Perceived Exertion (Exercise)  15    Perceived Dyspnea (Exercise)  3    Symptoms  none    Duration  Continue with 45 min of aerobic exercise without signs/symptoms of physical distress.    Intensity  THRR unchanged      Progression   Progression  Continue to progress workloads to maintain intensity without signs/symptoms of physical distress.    Average METs   2.9      Resistance Training   Training Prescription  Yes    Weight  5 lb    Reps  10-15      Interval Training   Interval Training  No      Oxygen   Oxygen  Continuous    Liters  2      Arm Ergometer   Level  5    RPM  37    Minutes  15    METs  2.9      Home Exercise Plan   Plans to continue exercise at  Home (comment) walking   walking   Frequency  Add 2 additional days to program exercise sessions.    Initial Home Exercises Provided  04/12/17       Functional Capacity: 6 Minute Walk    Row Name 03/30/17 1528 05/24/17 0835 05/24/17 0839     6 Minute Walk   Phase  -  Discharge  -   Distance  810  feet  1370 feet  -   Distance % Change  -  69.1 % 560 ft  -   Walk Time  4.5 minutes  6 minutes  -   # of Rest Breaks  '2  1 17 ' sec  -   MPH  2.04  2.69  -   METS  2.6  3.32  -   RPE  17  13  -   Perceived Dyspnea   3  3  -   VO2 Peak  9.11  11.61  -   Symptoms  No  Yes (comment)  -   Comments  -  SOB, started on Room Air but changed to 2L at 4 min after saturations had dropped to 86%.  -   Resting HR  106 bpm  95 bpm  -   Resting BP  102/56  124/60  -   Max Ex. HR  126 bpm  110 bpm  -   Max Ex. BP  162/60  154/70  -   2 Minute Post BP  112/58  -  -     Interval Oxygen   Interval Oxygen?  Yes  -  -   Baseline Oxygen Saturation %  97 %  -  98 %   Resting Liters of Oxygen  2 L  -  0 L Room Air   1 Minute Oxygen Saturation %  96 %  -  -   1 Minute Liters of Oxygen  2 L  -  -   2 Minute Oxygen Saturation %  94 %  -  -   2 Minute Liters of Oxygen  2 L  -  -   3 Minute Liters of Oxygen  2 L  -  -   4 Minute Oxygen Saturation %  93 %  -  86 %   4 Minute Liters of Oxygen  2 L  -  0 L   5 Minute Liters of Oxygen  2 L  -  -   6 Minute Oxygen Saturation %  94 %  -  92 %   6 Minute Liters of Oxygen  2 L  -  2 L   2 Minute Post Liters of Oxygen  2 L  -  -   Row Name 05/30/17 1243 08/30/17 1045       6 Minute Walk   Phase  Initial  Discharge    Distance  340 feet   1200 feet    Distance % Change  -  252.9 %    Distance Feet Change  -  860 ft    Walk Time  1.75 minutes  6 minutes    # of Rest Breaks  2 2:11 and 2:06  0    MPH  2.25  2.27    METS  1.42  3.14    RPE  15  15    Perceived Dyspnea   4  3    VO2 Peak  4.98  10.9    Symptoms  Yes (comment)  Yes (comment)    Comments  SOB, fatigue, tight breathing, felt like coming down with cold.  Oxygen saturations dropped once seated both time.  SOB    Resting HR  88 bpm  102 bpm    Resting BP  134/70  134/60    Resting Oxygen Saturation   -  99 %    Exercise Oxygen Saturation  during 6 min walk  -  89 %  Max Ex. HR  99 bpm  119 bpm    Max Ex. BP  150/68  154/72    2 Minute Post BP  126/64  136/64      Interval HR   Baseline HR (retired)  88  -    1 Minute HR  95  104    2 Minute HR  86  117    3 Minute HR  89  119    4 Minute HR  99  114    5 Minute HR  87  114    6 Minute HR  86  116    2 Minute Post HR  88  102    Interval Heart Rate?  Yes  Yes      Interval Oxygen   Interval Oxygen?  Yes  Yes    Baseline Oxygen Saturation %  91 %  99 %    Resting Liters of Oxygen  2 L  -    1 Minute Oxygen Saturation %  93 %  99 %    1 Minute Liters of Oxygen  2 L  2 L    2 Minute Oxygen Saturation %  84 % seated rest from 0:48-2:59  97 %    2 Minute Liters of Oxygen  2 L  2 L    3 Minute Oxygen Saturation %  91 %  93 %    3 Minute Liters of Oxygen  2 L  2 L    4 Minute Oxygen Saturation %  92 % seated stopped at 3:54  91 %    4 Minute Liters of Oxygen  2 L  2 L    5 Minute Oxygen Saturation %  85 %  91 %    5 Minute Liters of Oxygen  2 L  2 L    6 Minute Oxygen Saturation %  91 %  90 % 6:06 89%    6 Minute Liters of Oxygen  2 L  2 L    2 Minute Post Oxygen Saturation %  95 %  97 %    2 Minute Post Liters of Oxygen  2 L  2 L       Quality of Life: Quality of Life - 05/22/17 0851      Quality of Life Scores   Health/Function Pre  23.46 %    Health/Function Post  24.46 %     Health/Function % Change  4.26 %    Socioeconomic Pre  29 %    Socioeconomic Post  27.5 %    Socioeconomic % Change   -5.17 %    Psych/Spiritual Pre  30 %    Psych/Spiritual Post  28.29 %    Psych/Spiritual % Change  -5.7 %    Family Pre  28.5 %    Family Post  28.5 %    Family % Change  0 %    GLOBAL Pre  26.58 %    GLOBAL Post  26.5 %    GLOBAL % Change  -0.3 %       Personal Goals: Goals established at orientation with interventions provided to work toward goal. Personal Goals and Risk Factors at Admission - 05/30/17 1126      Core Components/Risk Factors/Patient Goals on Admission    Weight Management  Yes    Intervention  Weight Management: Develop a combined nutrition and exercise program designed to reach desired caloric intake, while maintaining appropriate intake of nutrient and fiber,  sodium and fats, and appropriate energy expenditure required for the weight goal.;Weight Management: Provide education and appropriate resources to help participant work on and attain dietary goals.;Weight Management/Obesity: Establish reasonable short term and long term weight goals.    Admit Weight  150 lb (68 kg)    Goal Weight: Short Term  150 lb (68 kg)    Goal Weight: Long Term  150 lb (68 kg) wants to maintain his current weight   wants to maintain his current weight   Expected Outcomes  Short Term: Continue to assess and modify interventions until short term weight is achieved;Long Term: Adherence to nutrition and physical activity/exercise program aimed toward attainment of established weight goal;Weight Maintenance: Understanding of the daily nutrition guidelines, which includes 25-35% calories from fat, 7% or less cal from saturated fats, less than 251m cholesterol, less than 1.5gm of sodium, & 5 or more servings of fruits and vegetables daily;Understanding recommendations for meals to include 15-35% energy as protein, 25-35% energy from fat, 35-60% energy from carbohydrates, less than  2075mof dietary cholesterol, 20-35 gm of total fiber daily;Understanding of distribution of calorie intake throughout the day with the consumption of 4-5 meals/snacks    Improve shortness of breath with ADL's  Yes    Intervention  Provide education, individualized exercise plan and daily activity instruction to help decrease symptoms of SOB with activities of daily living.    Expected Outcomes  Short Term: Achieves a reduction of symptoms when performing activities of daily living.    Develop more efficient breathing techniques such as purse lipped breathing and diaphragmatic breathing; and practicing self-pacing with activity  Yes    Intervention  Provide education, demonstration and support about specific breathing techniuqes utilized for more efficient breathing. Include techniques such as pursed lipped breathing, diaphragmatic breathing and self-pacing activity.    Expected Outcomes  Short Term: Participant will be able to demonstrate and use breathing techniques as needed throughout daily activities.    Increase knowledge of respiratory medications and ability to use respiratory devices properly   Yes    Intervention  Provide education and demonstration as needed of appropriate use of medications, inhalers, and oxygen therapy.    Expected Outcomes  Short Term: Achieves understanding of medications use. Understands that oxygen is a medication prescribed by physician. Demonstrates appropriate use of inhaler and oxygen therapy.    Heart Failure  Yes    Intervention  Provide a combined exercise and nutrition program that is supplemented with education, support and counseling about heart failure. Directed toward relieving symptoms such as shortness of breath, decreased exercise tolerance, and extremity edema.    Expected Outcomes  Improve functional capacity of life;Short term: Attendance in program 2-3 days a week with increased exercise capacity. Reported lower sodium intake. Reported increased fruit  and vegetable intake. Reports medication compliance.;Short term: Daily weights obtained and reported for increase. Utilizing diuretic protocols set by physician.;Long term: Adoption of self-care skills and reduction of barriers for early signs and symptoms recognition and intervention leading to self-care maintenance.    Lipids  Yes    Intervention  Provide education and support for participant on nutrition & aerobic/resistive exercise along with prescribed medications to achieve LDL <7023mHDL >70m50m  Expected Outcomes  Short Term: Participant states understanding of desired cholesterol values and is compliant with medications prescribed. Participant is following exercise prescription and nutrition guidelines.;Long Term: Cholesterol controlled with medications as prescribed, with individualized exercise RX and with personalized nutrition plan. Value goals: LDL <  57m, HDL > 40 mg.    Personal Goal Other  Yes    Personal Goal  Increase upper body strength    Expected Outcomes  Short: start pulmonary rehab. Long: To have increased upper body strength to increase his ADL        Personal Goals Discharge: Goals and Risk Factor Review - 08/21/17 1709      Core Components/Risk Factors/Patient Goals Review   Personal Goals Review  Weight Management/Obesity;Improve shortness of breath with ADL's;Lipids    Review  JJessiahhas maintained a healthy weight since the start of the program. His shotness of breath is ok when he is sitting down but the treadmill makes him really short of breath. His lipids have been under control.     Expected Outcomes  Short: Work on PLB to improve ADL's. Long: Be independent with PLB to do ADL.       Exercise Goals and Review: Exercise Goals    Row Name 03/30/17 1528 05/30/17 1251           Exercise Goals   Increase Physical Activity  Yes  Yes      Intervention  Provide advice, education, support and counseling about physical activity/exercise needs.;Develop an  individualized exercise prescription for aerobic and resistive training based on initial evaluation findings, risk stratification, comorbidities and participant's personal goals.  Provide advice, education, support and counseling about physical activity/exercise needs.;Develop an individualized exercise prescription for aerobic and resistive training based on initial evaluation findings, risk stratification, comorbidities and participant's personal goals.      Expected Outcomes  Achievement of increased cardiorespiratory fitness and enhanced flexibility, muscular endurance and strength shown through measurements of functional capacity and personal statement of participant.  Achievement of increased cardiorespiratory fitness and enhanced flexibility, muscular endurance and strength shown through measurements of functional capacity and personal statement of participant.      Increase Strength and Stamina  Yes  Yes      Intervention  Provide advice, education, support and counseling about physical activity/exercise needs.;Develop an individualized exercise prescription for aerobic and resistive training based on initial evaluation findings, risk stratification, comorbidities and participant's personal goals.  Provide advice, education, support and counseling about physical activity/exercise needs.;Develop an individualized exercise prescription for aerobic and resistive training based on initial evaluation findings, risk stratification, comorbidities and participant's personal goals.      Expected Outcomes  Achievement of increased cardiorespiratory fitness and enhanced flexibility, muscular endurance and strength shown through measurements of functional capacity and personal statement of participant.  Achievement of increased cardiorespiratory fitness and enhanced flexibility, muscular endurance and strength shown through measurements of functional capacity and personal statement of participant.          Nutrition & Weight - Outcomes: Pre Biometrics - 05/30/17 1251      Pre Biometrics   Height  5' 4.6" (1.641 m)    Weight  150 lb 12.8 oz (68.4 kg)    Waist Circumference  35.5 inches    Hip Circumference  37 inches    Waist to Hip Ratio  0.96 %    BMI (Calculated)  25.5      Post Biometrics - 08/30/17 1046       Post  Biometrics   Height  5' 4.6" (1.641 m)    Weight  152 lb (68.9 kg)    Waist Circumference  34 inches    Hip Circumference  37 inches    Waist to Hip Ratio  0.92 %    BMI (  Calculated)  25.6       Nutrition: Nutrition Therapy & Goals - 08/21/17 1717      Nutrition Therapy   RD appointment defered  Yes      Personal Nutrition Goals   Comments  Inmer declines seeing the dietician.       Nutrition Discharge: Nutrition Assessments - 08/28/17 1114      MEDFICTS Scores   Pre Score  0       Education Questionnaire Score: Knowledge Questionnaire Score - 08/28/17 1115      Knowledge Questionnaire Score   Pre Score  9/10    Post Score  8/10 reviewed with patient   reviewed with patient      Goals reviewed with patient; copy given to patient.

## 2017-09-04 NOTE — Progress Notes (Signed)
Daily Session Note  Patient Details  Name: Thomas Trujillo MRN: [id number] Date of Birth: 06/05/1946 Referring Provider:     Pulmonary Rehab from 05/30/2017 in [facility]  Referring Provider  Ancil Linsey MD      Encounter Date: 09/04/2017  Check In: Session Check In - 09/04/17 1008      Check-In   Location  [facility]-Card[facility] & Pulmonary Re[facility]    Staff Present  Nada Maclachlan, BA, ACSM CEP, Exercise Physiologist;Kelly Amedeo Plenty, BS, ACSM CEP, Exercise Physiologist;Noami Bove Flavia Shipper    Supervising physician immediately available to respond to emergencies  [facility] immediately available ER MD    Physician(s)  Dr. Burlene Arnt and Archie Balboa    Medication changes reported      No    Fall or balance concerns reported     No    Warm-up and Cool-down  Performed as group-led instruction    Resistance Training Performed  Yes    VAD Patient?  No      Pain Assessment   Currently in Pain?  No/denies          Social History   Tobacco Use  Smoking Status Former Smoker  . Last attempt to quit: 04/06/2007  . Years since quitting: 10.4  Smokeless Tobacco Never Used    Goals Met:  Proper associated with RPD/PD & O2 Sat Independence with exercise equipment Exercise tolerated well No report of cardiac concerns or symptoms Strength training completed today  Goals Unmet:  Not Applicable  Comments:  Alquan graduated today from cardiac rehab with 36 sessions completed.  Details of the patient's exercise prescription and what He needs to do in order to continue the prescription and progress were discussed with patient.  Patient was given a copy of prescription and goals.  Patient verbalized understanding.  Reda plans to continue to exercise by at a [employer].   Dr. Emily Filbert is Medical Director for [facility] and [facility].

## 2017-09-13 ENCOUNTER — Ambulatory Visit
Admission: RE | Admit: 2017-09-13 | Discharge: 2017-09-13 | Disposition: A | Discharge status: Home / Self Care | Payer: Medicare Other | Source: Ambulatory Visit | Attending: Internal Medicine | Admitting: Internal Medicine

## 2017-09-13 ENCOUNTER — Ambulatory Visit (INDEPENDENT_AMBULATORY_CARE_PROVIDER_SITE_OTHER): Payer: Medicare Other | Admitting: Internal Medicine

## 2017-09-13 VITALS — BP 106/58 | HR 76 | Ht 64.0 in | Wt 152.2 lb

## 2017-09-13 DIAGNOSIS — R0602 Shortness of breath: Secondary | ICD-10-CM | POA: Diagnosis present

## 2017-09-13 DIAGNOSIS — J984 Other disorders of lung: Secondary | ICD-10-CM | POA: Insufficient documentation

## 2017-09-13 DIAGNOSIS — I2510 Atherosclerotic heart disease of native coronary artery without angina pectoris: Secondary | ICD-10-CM

## 2017-09-13 DIAGNOSIS — J449 Chronic obstructive pulmonary disease, unspecified: Secondary | ICD-10-CM | POA: Insufficient documentation

## 2017-09-13 DIAGNOSIS — I255 Ischemic cardiomyopathy: Secondary | ICD-10-CM | POA: Diagnosis present

## 2017-09-13 DIAGNOSIS — E782 Mixed hyperlipidemia: Secondary | ICD-10-CM | POA: Diagnosis not present

## 2017-09-13 DIAGNOSIS — R0609 Other forms of dyspnea: Secondary | ICD-10-CM | POA: Diagnosis not present

## 2017-09-13 NOTE — Progress Notes (Signed)
Follow-up Outpatient Visit Date: 09/13/2017  Primary Care Provider: [name], MD [place] [facility] [place] [address]  Chief Complaint: Shortness of breath  HPI:  Thomas Trujillo is a 71 y.o. year-old male with history of coronary artery disease status post CABG (01/2017) in the setting of NSTEMI, who presents for follow-up of coronary artery disease.  I last saw him in August, at which time he was feeling fairly well.  He noted some dyspnea on exertion when walking on a treadmill and continued to use supplemental oxygen when walking.  Today, Thomas Trujillo reports that he has developed increased dyspnea on exertion over the last month.  He feels that he is not able to walk quite as far as he could a month or 2 ago.  He had been recovering well following CABG and subsequent spontaneous pneumothorax.  He has not had any chest pain, palpitations, and lightheadedness.  He also denies orthopnea and edema.  He uses supplemental oxygen with activity.  He still tries to walk on his treadmill at least 3 days a week.  He denies fevers and chills.  He has minimal cough no sputum production.  He is scheduled to follow up with Dr. Raul Del in January. He denies bleeding.  --------------------------------------------------------------------------------------------------  Cardiovascular History & Procedures: Cardiovascular Problems:  Coronary artery disease s/p high-risk NSTEMI and urgent CABG (01/2017)  Risk Factors:  Known coronary artery disease, male gender, and age > 22.  Cath/PCI:  LHC (02/14/17): LMCA with 20% distal stenosis. LAD with 95% ostial lesion and 80-90% midvessel stenosis. Moderate-caliber ramus with 80% ostial stenosis. Codominant LCx and RCA without significant disease. LVEF 40-45% with mid and apical anterior hypokinesis.  CV Surgery:  CABG (02/14/17, Dr. Servando Snare): LIMA->LAD, and SVG->ramus  EP Procedures and Devices:  None  Non-Invasive Evaluation(s):  Lower  extremity venous duplex (04/28/17): No evidence of DVT.  TTE (04/21/17): Poor acoustic windows. Normal LV size with mild LVH. LVEF 55-60%. Grade 1 diastolic dysfunction. Mildly dilated right ventricle with normal wall thickness and contraction. No significant valvular abnormalities.  TEE (02/14/17, intraoperative): LVEF 35-40% with trace MR.  Recent CV Pertinent Labs: Lab Results  Component Value Date   CHOL 114 02/14/2017   HDL 39 (L) 02/14/2017   LDLCALC 65 02/14/2017   TRIG 51 02/14/2017   CHOLHDL 2.9 02/14/2017   INR 1.62 02/14/2017   BNP 945.8 (H) 02/17/2017   K 4.9 05/30/2017   MG 2.3 02/15/2017   BUN 27 (H) 05/30/2017   BUN 23 04/05/2017   CREATININE 1.13 05/30/2017    Past medical and surgical history were reviewed and updated in EPIC.  Current Meds  Medication Sig  . aspirin 325 MG EC tablet Take 1 tablet (325 mg total) by mouth daily.  [name] atorvastatin (LIPITOR) 40 MG tablet Take 40 mg by mouth daily.  . bisacodyl (DULCOLAX) 5 MG EC tablet Take 2 tablets (10 mg total) by mouth daily as needed for moderate constipation (Give daily if no BM).  . budesonide (PULMICORT) 0.5 MG/2ML nebulizer solution Take 0.5 mg by nebulization daily.  . ferrous sulfate 325 (65 FE) MG tablet Take 325 mg by mouth daily with breakfast.  . furosemide (LASIX) 20 MG tablet TAKE 1-2 TABS AS DIRECTED ONCE DAILY WITH ADDITIONAL DOSE IF GAINS 2 POUNDS IN A DAY OR 5 IN A WEEK  . ipratropium-albuterol (DUONEB) 0.5-2.5 (3) MG/3ML SOLN Take 3 mLs by nebulization every 6 (six) hours as needed. When awake   . metoprolol tartrate (LOPRESSOR) 25  MG tablet Take 12.5 mg by mouth 2 (two) times daily.  . OXYGEN Inhale into the lungs. By nasal cannula and wean as tolerated to keep O2 sats >90%  . potassium chloride (K-DUR) 10 MEQ tablet Take 10 mEq by mouth daily.  . tamsulosin (FLOMAX) 0.4 MG CAPS capsule Take 1 capsule (0.4 mg total) by mouth daily.    Allergies: No known allergies  Social History    Socioeconomic History  . Marital status: Divorced    Spouse name: Not on file  . Number of children: Not on file  . Years of education: Not on file  . Highest education level: Not on file  Social Needs  . Financial resource strain: Not on file  . Food insecurity - worry: Not on file  . Food insecurity - inability: Not on file  . Transportation needs - medical: Not on file  . Transportation needs - non-medical: Not on file  Occupational History  . Not on file  Tobacco Use  . Smoking status: Former Smoker    Last attempt to quit: 04/06/2007    Years since quitting: 10.4  . Smokeless tobacco: Never Used  Substance and Sexual Activity  . Alcohol use: No  . Drug use: No  . Sexual activity: Not on file  Other Topics Concern  . Not on file  Social History Narrative  . Not on file    Family History  Problem Relation Age of Onset  . Obesity Son     Review of Systems: A 12-system review of systems was performed and was negative except as noted in the HPI.  --------------------------------------------------------------------------------------------------  Physical Exam: BP (!) 106/58 (BP Location: Left Arm, Patient Position: Sitting, Cuff Size: Normal)   Pulse 76   Ht 5\' 4"  (1.626 m)   Wt 152 lb 4 oz (69.1 kg)   BMI 26.13 kg/m   General: Well-developed, well-nourished man, seated comfortably in the exam room. HEENT: No conjunctival pallor or scleral icterus. Moist mucous membranes.  OP clear. Neck: Supple without lymphadenopathy, thyromegaly, JVD, or HJR.  Lungs: Normal work of breathing. Mildly diminished breath sounds throughout. No wheezes or crackles. Heart: Regular rate and rhythm without murmurs, rubs, or gallops. Non-displaced PMI. Abd: Bowel sounds present. Soft, NT/ND without hepatosplenomegaly Ext: No lower extremity edema. Radial, PT, and DP pulses are 2+ bilaterally. Skin: Warm and dry without rash.  EKG: Normal sinus rhythm, left axis deviation, and rSR' in  V1 and V2. Left axis deviation is new since 06/07/17. Otherwise, there has been no significant change.  Lab Results  Component Value Date   WBC 9.1 05/30/2017   HGB 13.9 05/30/2017   HCT 42.0 05/30/2017   MCV 91.3 05/30/2017   PLT 216 05/30/2017    Lab Results  Component Value Date   NA 137 05/30/2017   K 4.9 05/30/2017   CL 104 05/30/2017   CO2 23 05/30/2017   BUN 27 (H) 05/30/2017   CREATININE 1.13 05/30/2017   GLUCOSE 115 (H) 05/30/2017   ALT 19 04/05/2017    Lab Results  Component Value Date   CHOL 114 02/14/2017   HDL 39 (L) 02/14/2017   LDLCALC 65 02/14/2017   TRIG 51 02/14/2017   CHOLHDL 2.9 02/14/2017    --------------------------------------------------------------------------------------------------  ASSESSMENT AND PLAN: Coronary artery disease without angina No chest pain to suggest worsening coronary insufficiency.  Recent increase in exertional dyspnea is nonspecific.  EKG today does not show any significant changes from prior tracings.  We will continue Thomas Trujillo'  current medications, including aspirin, atorvastatin, and metoprolol.  Aspirin could be decreased to 81 mg daily; we will readdress this at follow-up.  Dyspnea on exertion and COPD Exam today is notable only for mildly diminished breath sounds.  Thomas Trujillo appears euvolemic on exam.  LVEF was found to be normal on most recent echo this summer.  We will obtain a chest radiograph today to ensure that he does not have any developing lung process or spontaneous pneumothorax, though exam does not suggest this.  I have encouraged Thomas Trujillo to follow-up with Dr. Raul Del at his earliest convenience.  If symptoms persist and pulmonary evaluation is unrevealing, we will need to consider myocardial perfusion stress test.  Hyperlipidemia Thomas Trujillo is tolerating high intensity statin therapy well.  LDL in April was at goal (less than 70).  Follow-up: Return to clinic in early January,  2019.  [name], MD 09/14/2017 10:20 AM

## 2017-09-13 NOTE — Patient Instructions (Signed)
Medication Instructions:  Your physician recommends that you continue on your current medications as directed. Please refer to the Current Medication list given to you today.  Labwork: none  Testing/Procedures: A chest x-ray takes a picture of the organs and structures inside the chest, including the heart, lungs, and blood vessels. This test can show several things, including, whether the heart is enlarges; whether fluid is building up in the lungs; and whether pacemaker / defibrillator leads are still in place.  - Please go to the [facility] You will check in at the front desk to the right as you walk into the atrium. Valet Parking is offered if needed.     Follow-Up: Your physician recommends that you schedule a follow-up appointment in: early to [facility] January with Dr End.   If you need a refill on your cardiac medications before your next appointment, please call your pharmacy.

## 2017-09-14 ENCOUNTER — Encounter: Payer: Self-pay | Admitting: Internal Medicine

## 2017-09-14 DIAGNOSIS — R0609 Other forms of dyspnea: Secondary | ICD-10-CM

## 2017-09-25 ENCOUNTER — Telehealth: Payer: Self-pay | Admitting: Internal Medicine

## 2017-09-25 NOTE — Telephone Encounter (Signed)
It appears that the only pneumonia vaccine currently marketed by [employer] is Pneumovax 23, which protects against 23 strains of pneumococcus. I suspect that is what Mr. Wiemann received, though the only way to know for sure is for the patient to inquire with [facility] regarding what type of vaccine is supplied to their facility.  [name], MD [facility] Pager: [phone]

## 2017-09-25 NOTE — Telephone Encounter (Signed)
Patient had a pneumonia vaccine 02/2017 at [facility] place and wants Dr. Darnelle Bos nurse to know this was where he had it done and to see if she can clarify for him if the shot was 23 or 13 type of vaccine   Please call

## 2017-09-25 NOTE — Telephone Encounter (Signed)
Patients son called and states that his father had the vaccine over at [facility] and that Dr. Saunders Revel said we could call and clarify which type either 23 or 13. Advised that I would try to get this information and update his chart. Attempted to call [facility] and had to leave message.

## 2017-09-25 NOTE — Telephone Encounter (Signed)
Spoke with patients son per release form and reviewed information that [employer] had regarding pneumonia vaccination and advised that I would make Dr. Saunders Revel aware they did not have the specific information as to the type. He verbalized understanding with no further questions at this time.

## 2017-09-25 NOTE — Telephone Encounter (Signed)
Thomas Trujillo place only has the following information on the pneumonia vaccine he received. Mar 21, 2017 0.5 ml [employer]ck Lot# [id number] Exp 11/24/17 So they do not have any additional information on the type.  Will make Dr. Saunders Revel and patients son aware and enter this information into our immunizations.

## 2017-10-25 ENCOUNTER — Other Ambulatory Visit: Payer: Self-pay | Admitting: Internal Medicine

## 2017-11-06 NOTE — Progress Notes (Signed)
Follow-up Outpatient Visit Date: 11/08/2017  Primary Care Provider: [name], MD [place] [facility] [place] [address]  Chief Complaint: Shortness of breath  HPI:  Thomas Trujillo is a 72 y.o. year-old male with history of coronary artery disease status post CABG (01/2017) in the setting of NSTEMI, who presents for follow-up of coronary artery disease and dyspnea on exertion. I last saw him on 09/13/17, at which time he reported increasing shortness of breath with activity over the preceding month. We obtained a CXR, given history of spontaneous pneumothorax this summer, which showed COPD and chronic scarring but no acute process. He was seen by Dr. Raul Del (pulmonology) in early December, at which time his symptoms were attributed to severe COPD. He was continued on DuoNebs and nebulized budenoside.  Today, Thomas Trujillo reports feeling about the same.  He still has dyspnea on exertion with mild activity.  He uses supplemental oxygen as needed for shortness of breath.  He denies chest pain, palpitations, lightheadedness, orthopnea, PND, and edema.  He has been monitoring his blood pressure at home and only takes metoprolol tartrate if his systolic pressure is above 110 mmHg.  He notes that he typically only takes the evening dose based on his blood pressure.  His weight has been stable at home; he thinks his increased weight today may be related to clothing.  --------------------------------------------------------------------------------------------------  Cardiovascular History & Procedures: Cardiovascular Problems:  Coronary artery disease s/p high-risk NSTEMI and urgent CABG (01/2017)  Risk Factors:  Known coronary artery disease, male gender, and age > 27.  Cath/PCI:  LHC (02/14/17): LMCA with 20% distal stenosis. LAD with 95% ostial lesion and 80-90% midvessel stenosis. Moderate-caliber ramus with 80% ostial stenosis. Codominant LCx and RCA without significant disease. LVEF  40-45% with mid and apical anterior hypokinesis.  CV Surgery:  CABG (02/14/17, Dr. Servando Snare): LIMA->LAD, and SVG->ramus  EP Procedures and Devices:  None  Non-Invasive Evaluation(s):  Lower extremity venous duplex (04/28/17): No evidence of DVT.  TTE (04/21/17): Poor acoustic windows. Normal LV size with mild LVH. LVEF 55-60%. Grade 1 diastolic dysfunction. Mildly dilated right ventricle with normal wall thickness and contraction. No significant valvular abnormalities.  TEE (02/14/17, intraoperative): LVEF 35-40% with trace MR.  Recent CV Pertinent Labs: Lab Results  Component Value Date   CHOL 114 02/14/2017   HDL 39 (L) 02/14/2017   LDLCALC 65 02/14/2017   TRIG 51 02/14/2017   CHOLHDL 2.9 02/14/2017   INR 1.62 02/14/2017   BNP 945.8 (H) 02/17/2017   K 4.9 05/30/2017   MG 2.3 02/15/2017   BUN 27 (H) 05/30/2017   BUN 23 04/05/2017   CREATININE 1.13 05/30/2017    Past medical and surgical history were reviewed and updated in EPIC.  Current Meds  Medication Sig  . aspirin 325 MG EC tablet Take 1 tablet (325 mg total) by mouth daily.  [name] atorvastatin (LIPITOR) 40 MG tablet Take 40 mg by mouth daily.  . bisacodyl (DULCOLAX) 5 MG EC tablet Take 2 tablets (10 mg total) by mouth daily as needed for moderate constipation (Give daily if no BM).  . budesonide (PULMICORT) 0.5 MG/2ML nebulizer solution Take 0.5 mg by nebulization daily.  . ferrous sulfate 325 (65 FE) MG tablet Take 325 mg by mouth daily with breakfast.  . furosemide (LASIX) 20 MG tablet TAKE 1-2 TABS AS DIRECTED ONCE DAILY WITH ADDITIONAL DOSE IF GAINS 2 POUNDS IN A DAY OR 5 IN A WEEK  . ipratropium-albuterol (DUONEB) 0.5-2.5 (3) MG/3ML SOLN Take  3 mLs by nebulization every 6 (six) hours as needed. When awake   . metoprolol tartrate (LOPRESSOR) 25 MG tablet Take 12.5 mg by mouth 2 (two) times daily.  . OXYGEN Inhale into the lungs. By nasal cannula and wean as tolerated to keep O2 sats >90%  . potassium chloride  (K-DUR) 10 MEQ tablet Take 10 mEq by mouth daily.  . tamsulosin (FLOMAX) 0.4 MG CAPS capsule Take 1 capsule (0.4 mg total) by mouth daily.    Allergies: No known allergies  Social History   Socioeconomic History  . Marital status: Divorced    Spouse name: Not on file  . Number of children: Not on file  . Years of education: Not on file  . Highest education level: Not on file  Social Needs  . Financial resource strain: Not on file  . Food insecurity - worry: Not on file  . Food insecurity - inability: Not on file  . Transportation needs - medical: Not on file  . Transportation needs - non-medical: Not on file  Occupational History  . Not on file  Tobacco Use  . Smoking status: Former Smoker    Last attempt to quit: 04/06/2007    Years since quitting: 10.6  . Smokeless tobacco: Never Used  Substance and Sexual Activity  . Alcohol use: No  . Drug use: No  . Sexual activity: Not on file  Other Topics Concern  . Not on file  Social History Narrative  . Not on file    Family History  Problem Relation Age of Onset  . Obesity Son     Review of Systems: A 12-system review of systems was performed and was negative except as noted in the HPI.  --------------------------------------------------------------------------------------------------  Physical Exam: BP (!) 104/58 (BP Location: Left Arm, Patient Position: Sitting, Cuff Size: Normal)   Pulse 76   Ht 5\' 4"  (1.626 m)   Wt 157 lb (71.2 kg)   BMI 26.95 kg/m   General: Chronically ill-appearing man, seated comfortably in the exam room.  He is accompanied by his son. HEENT: No conjunctival pallor or scleral icterus. Moist mucous membranes.  OP clear. Neck: Supple without lymphadenopathy, thyromegaly, JVD, or HJR. Lungs: Normal work of breathing.  Mildly diminished breath sounds throughout without wheezes or crackles. Heart: Distant heart sounds.  Regular rate and rhythm without murmurs, rubs, or gallops. Non-displaced  PMI.  Median sternotomy incision well-healed. Abd: Bowel sounds present. Soft, NT/ND without hepatosplenomegaly Ext: No lower extremity edema. Radial, PT, and DP pulses are 2+ bilaterally. Skin: Warm and dry without rash.  EKG: Normal sinus rhythm with left axis deviation and rSR' in V1 and V2.  No significant change from prior tracing on 09/13/17.  Lab Results  Component Value Date   WBC 9.1 05/30/2017   HGB 13.9 05/30/2017   HCT 42.0 05/30/2017   MCV 91.3 05/30/2017   PLT 216 05/30/2017    Lab Results  Component Value Date   NA 137 05/30/2017   K 4.9 05/30/2017   CL 104 05/30/2017   CO2 23 05/30/2017   BUN 27 (H) 05/30/2017   CREATININE 1.13 05/30/2017   GLUCOSE 115 (H) 05/30/2017   ALT 19 04/05/2017    Lab Results  Component Value Date   CHOL 114 02/14/2017   HDL 39 (L) 02/14/2017   LDLCALC 65 02/14/2017   TRIG 51 02/14/2017   CHOLHDL 2.9 02/14/2017    --------------------------------------------------------------------------------------------------  ASSESSMENT AND PLAN: Coronary artery disease without angina No symptoms such to suggest  worsening coronary insufficiency.  Chronic dyspnea on exertion is likely multifactorial and driven predominantly by underlying lung disease.  Given soft blood pressures at times, we have agreed to discontinue metoprolol tartrate.  I have also asked Thomas Trujillo to decrease aspirin to 81 mg daily when he has run out of his current supply.  He will speak with his PCP about having a lipid panel performed in [place] at his convenience.  Ischemic cardiomyopathy Thomas Trujillo appears well compensated on exam today.  We will continue his current Lasix regimen.  We will discontinue metoprolol, as above, due to borderline low blood pressures.  COPD Likely predominant cause for continued dyspnea on exertion.  Continue supplemental oxygen and follow-up with Dr. Raul Del.  Hyperlipidemia Goal LDL < 70. Last lipid panel was around the time of his  MI in April. He would like to have a fasting lipid panel done through his PCP. We will continue atorvastatin 40 mg daily for now.  Follow-up: Return to clinic in 6 months.  [name], MD 11/08/2017 10:07 AM

## 2017-11-08 ENCOUNTER — Ambulatory Visit (INDEPENDENT_AMBULATORY_CARE_PROVIDER_SITE_OTHER): Payer: Medicare Other | Admitting: Internal Medicine

## 2017-11-08 ENCOUNTER — Encounter: Payer: Self-pay | Admitting: Internal Medicine

## 2017-11-08 VITALS — BP 104/58 | HR 76 | Ht 64.0 in | Wt 157.0 lb

## 2017-11-08 DIAGNOSIS — J449 Chronic obstructive pulmonary disease, unspecified: Secondary | ICD-10-CM

## 2017-11-08 DIAGNOSIS — I2510 Atherosclerotic heart disease of native coronary artery without angina pectoris: Secondary | ICD-10-CM | POA: Diagnosis not present

## 2017-11-08 DIAGNOSIS — E785 Hyperlipidemia, unspecified: Secondary | ICD-10-CM

## 2017-11-08 DIAGNOSIS — I255 Ischemic cardiomyopathy: Secondary | ICD-10-CM

## 2017-11-08 MED ORDER — ASPIRIN EC 81 MG PO TBEC: 81.0000 mg | DELAYED_RELEASE_TABLET | Freq: Every day | ORAL | 3 refills | Status: AC

## 2017-11-08 NOTE — Patient Instructions (Signed)
Medication Instructions:  Your physician has recommended you make the following change in your medication:  1- STOP Metoprolol. 2- DECREASE Aspirin to 81 mg by mouth once a day.   Labwork: Please send a MyChart Message to Dr End when you have had your cholesterol checked so he can view the results.  Testing/Procedures: none  Follow-Up: Your physician wants you to follow-up in: 6 MONTHS WITH DR END. You will receive a reminder letter in the mail two months in advance. If you don't receive a letter, please call our office to schedule the follow-up appointment.   If you need a refill on your cardiac medications before your next appointment, please call your pharmacy.

## 2017-11-18 ENCOUNTER — Emergency Department: Payer: Medicare Other

## 2017-11-18 ENCOUNTER — Other Ambulatory Visit: Payer: Self-pay

## 2017-11-18 ENCOUNTER — Inpatient Hospital Stay
Admission: EM | Admit: 2017-11-18 | Discharge: 2017-11-27 | DRG: 166 | Disposition: A | Discharge status: Home / Self Care | Payer: Medicare Other | Attending: Surgery | Admitting: Surgery

## 2017-11-18 DIAGNOSIS — Z951 Presence of aortocoronary bypass graft: Secondary | ICD-10-CM | POA: Diagnosis not present

## 2017-11-18 DIAGNOSIS — Z9841 Cataract extraction status, right eye: Secondary | ICD-10-CM

## 2017-11-18 DIAGNOSIS — Z7982 Long term (current) use of aspirin: Secondary | ICD-10-CM

## 2017-11-18 DIAGNOSIS — E785 Hyperlipidemia, unspecified: Secondary | ICD-10-CM | POA: Diagnosis present

## 2017-11-18 DIAGNOSIS — J9382 Other air leak: Secondary | ICD-10-CM | POA: Diagnosis not present

## 2017-11-18 DIAGNOSIS — Z9842 Cataract extraction status, left eye: Secondary | ICD-10-CM | POA: Diagnosis not present

## 2017-11-18 DIAGNOSIS — J9601 Acute respiratory failure with hypoxia: Secondary | ICD-10-CM

## 2017-11-18 DIAGNOSIS — J449 Chronic obstructive pulmonary disease, unspecified: Secondary | ICD-10-CM | POA: Diagnosis present

## 2017-11-18 DIAGNOSIS — J939 Pneumothorax, unspecified: Secondary | ICD-10-CM

## 2017-11-18 DIAGNOSIS — I255 Ischemic cardiomyopathy: Secondary | ICD-10-CM | POA: Diagnosis present

## 2017-11-18 DIAGNOSIS — I252 Old myocardial infarction: Secondary | ICD-10-CM

## 2017-11-18 DIAGNOSIS — R079 Chest pain, unspecified: Secondary | ICD-10-CM | POA: Diagnosis present

## 2017-11-18 DIAGNOSIS — Z79899 Other long term (current) drug therapy: Secondary | ICD-10-CM

## 2017-11-18 DIAGNOSIS — I2510 Atherosclerotic heart disease of native coronary artery without angina pectoris: Secondary | ICD-10-CM | POA: Diagnosis present

## 2017-11-18 DIAGNOSIS — J9383 Other pneumothorax: Principal | ICD-10-CM | POA: Diagnosis present

## 2017-11-18 DIAGNOSIS — Z87891 Personal history of nicotine dependence: Secondary | ICD-10-CM

## 2017-11-18 DIAGNOSIS — Z9689 Presence of other specified functional implants: Secondary | ICD-10-CM

## 2017-11-18 DIAGNOSIS — J9312 Secondary spontaneous pneumothorax: Secondary | ICD-10-CM | POA: Diagnosis not present

## 2017-11-18 LAB — CBC
HCT: 47.3 % (ref 40.0–52.0)
Hemoglobin: 15.9 g/dL (ref 13.0–18.0)
MCH: 31.2 pg (ref 26.0–34.0)
MCHC: 33.6 g/dL (ref 32.0–36.0)
MCV: 93.1 fL (ref 80.0–100.0)
Platelets: 185 10*3/uL (ref 150–440)
RBC: 5.08 MIL/uL (ref 4.40–5.90)
RDW: 15 % — ABNORMAL HIGH (ref 11.5–14.5)
WBC: 8.1 10*3/uL (ref 3.8–10.6)

## 2017-11-18 LAB — BASIC METABOLIC PANEL
Anion gap: 9 (ref 5–15)
BUN: 35 mg/dL — ABNORMAL HIGH (ref 6–20)
CHLORIDE: 105 mmol/L (ref 101–111)
CO2: 23 mmol/L (ref 22–32)
Calcium: 9.3 mg/dL (ref 8.9–10.3)
Creatinine, Ser: 1.14 mg/dL (ref 0.61–1.24)
GFR calc Af Amer: >60 mL/min (ref >60)
GFR calc non Af Amer: >60 mL/min (ref >60)
Glucose, Bld: 119 mg/dL — ABNORMAL HIGH (ref 65–99)
POTASSIUM: 4.5 mmol/L (ref 3.5–5.1)
SODIUM: 137 mmol/L (ref 135–145)

## 2017-11-18 LAB — TROPONIN I: Troponin I: <0.03 ng/mL (ref <0.03)

## 2017-11-18 MED ORDER — ONDANSETRON 4 MG PO TBDP: 4.0000 mg | ORAL_TABLET | Freq: Four times a day (QID) | ORAL | Status: DC | PRN

## 2017-11-18 MED ORDER — BUDESONIDE 0.5 MG/2ML IN SUSP: 0.5000 mg | Freq: Every day | RESPIRATORY_TRACT | Status: DC

## 2017-11-18 MED ORDER — ENOXAPARIN SODIUM 40 MG/0.4ML ~~LOC~~ SOLN
40.0000 mg | SUBCUTANEOUS | Status: DC
  Administered 2017-11-19 – 2017-11-20 (×2): 40 mg via SUBCUTANEOUS
  Filled 2017-11-18 (×3): qty 0.4

## 2017-11-18 MED ORDER — IPRATROPIUM-ALBUTEROL 0.5-2.5 (3) MG/3ML IN SOLN: 3.0000 mL | Freq: Four times a day (QID) | RESPIRATORY_TRACT | Status: DC | PRN

## 2017-11-18 MED ORDER — IPRATROPIUM-ALBUTEROL 0.5-2.5 (3) MG/3ML IN SOLN
3.0000 mL | Freq: Three times a day (TID) | RESPIRATORY_TRACT | Status: DC
  Administered 2017-11-18 – 2017-11-19 (×3): 3 mL via RESPIRATORY_TRACT
  Filled 2017-11-18 (×3): qty 3

## 2017-11-18 MED ORDER — KETOROLAC TROMETHAMINE 30 MG/ML IJ SOLN
30.0000 mg | Freq: Four times a day (QID) | INTRAMUSCULAR | Status: DC
  Administered 2017-11-18 – 2017-11-21 (×11): 30 mg via INTRAVENOUS
  Filled 2017-11-18 (×11): qty 1

## 2017-11-18 MED ORDER — ACETAMINOPHEN 500 MG PO TABS
1000.0000 mg | ORAL_TABLET | Freq: Four times a day (QID) | ORAL | Status: DC
  Administered 2017-11-18 – 2017-11-27 (×29): 1000 mg via ORAL
  Filled 2017-11-18 (×32): qty 2

## 2017-11-18 MED ORDER — FERROUS SULFATE 325 (65 FE) MG PO TABS
325.0000 mg | ORAL_TABLET | Freq: Every day | ORAL | Status: DC
  Administered 2017-11-19 – 2017-11-27 (×9): 325 mg via ORAL
  Filled 2017-11-18 (×9): qty 1

## 2017-11-18 MED ORDER — FENTANYL CITRATE (PF) 100 MCG/2ML IJ SOLN
50.0000 ug | INTRAMUSCULAR | Status: DC | PRN
Start: 2017-11-18 — End: 2017-11-24
  Administered 2017-11-18 – 2017-11-23 (×3): 50 ug via INTRAVENOUS
  Filled 2017-11-18: qty 2

## 2017-11-18 MED ORDER — HYDROMORPHONE HCL 1 MG/ML IJ SOLN: 0.5000 mg | INTRAMUSCULAR | Status: DC | PRN

## 2017-11-18 MED ORDER — IPRATROPIUM-ALBUTEROL 0.5-2.5 (3) MG/3ML IN SOLN
3.0000 mL | Freq: Once | RESPIRATORY_TRACT | Status: AC
  Administered 2017-11-18: 3 mL via RESPIRATORY_TRACT
  Filled 2017-11-18: qty 6

## 2017-11-18 MED ORDER — IPRATROPIUM-ALBUTEROL 0.5-2.5 (3) MG/3ML IN SOLN
3.0000 mL | Freq: Once | RESPIRATORY_TRACT | Status: AC
  Administered 2017-11-18: 3 mL via RESPIRATORY_TRACT

## 2017-11-18 MED ORDER — ONDANSETRON HCL 4 MG/2ML IJ SOLN: 4.0000 mg | Freq: Four times a day (QID) | INTRAMUSCULAR | Status: DC | PRN

## 2017-11-18 MED ORDER — ADULT MULTIVITAMIN W/MINERALS CH
ORAL_TABLET | Freq: Every day | ORAL | Status: DC
Start: 2017-11-19 — End: 2017-11-27
  Administered 2017-11-19 – 2017-11-27 (×9): 1 via ORAL
  Filled 2017-11-18 (×9): qty 1

## 2017-11-18 MED ORDER — OXYCODONE HCL 5 MG PO TABS
5.0000 mg | ORAL_TABLET | ORAL | Status: DC | PRN
  Administered 2017-11-23: 5 mg via ORAL
  Administered 2017-11-24 – 2017-11-25 (×5): 10 mg via ORAL
  Filled 2017-11-18 (×4): qty 2
  Filled 2017-11-18: qty 1
  Filled 2017-11-18: qty 2

## 2017-11-18 MED ORDER — FENTANYL CITRATE (PF) 100 MCG/2ML IJ SOLN
100.0000 ug | INTRAMUSCULAR | Status: DC | PRN
Start: 2017-11-18 — End: 2017-11-18
  Administered 2017-11-18: 100 ug via INTRAVENOUS
  Filled 2017-11-18: qty 2

## 2017-11-18 MED ORDER — POTASSIUM CHLORIDE CRYS ER 10 MEQ PO TBCR
10.0000 meq | EXTENDED_RELEASE_TABLET | Freq: Every day | ORAL | Status: DC
  Administered 2017-11-18 – 2017-11-27 (×10): 10 meq via ORAL
  Filled 2017-11-18 (×12): qty 1

## 2017-11-18 MED ORDER — ATORVASTATIN CALCIUM 20 MG PO TABS
40.0000 mg | ORAL_TABLET | Freq: Every day | ORAL | Status: DC
  Administered 2017-11-18 – 2017-11-27 (×9): 40 mg via ORAL
  Filled 2017-11-18 (×10): qty 2

## 2017-11-18 MED ORDER — IPRATROPIUM-ALBUTEROL 0.5-2.5 (3) MG/3ML IN SOLN: 3.0000 mL | Freq: Four times a day (QID) | RESPIRATORY_TRACT | Status: DC

## 2017-11-18 MED ORDER — KETAMINE HCL 10 MG/ML IJ SOLN
0.2000 mg/kg | Freq: Once | INTRAMUSCULAR | Status: AC
  Administered 2017-11-18: 14 mg via INTRAVENOUS
  Filled 2017-11-18: qty 1

## 2017-11-18 MED ORDER — SODIUM CHLORIDE 0.9 % IV BOLUS (SEPSIS)
500.0000 mL | Freq: Once | INTRAVENOUS | Status: AC
Start: 2017-11-18 — End: 2017-11-18
  Administered 2017-11-18: 500 mL via INTRAVENOUS

## 2017-11-18 MED ORDER — POLYETHYLENE GLYCOL 3350 17 G PO PACK: 17.0000 g | PACK | Freq: Every day | ORAL | Status: DC | PRN

## 2017-11-18 MED ORDER — FUROSEMIDE 20 MG PO TABS
20.0000 mg | ORAL_TABLET | Freq: Every day | ORAL | Status: DC
  Administered 2017-11-18 – 2017-11-27 (×10): 20 mg via ORAL
  Filled 2017-11-18 (×10): qty 1

## 2017-11-18 MED ORDER — BUPIVACAINE HCL (PF) 0.5 % IJ SOLN
30.0000 mL | Freq: Once | INTRAMUSCULAR | Status: AC
  Administered 2017-11-18: 30 mL
  Filled 2017-11-18: qty 30

## 2017-11-18 MED ORDER — PANTOPRAZOLE SODIUM 40 MG IV SOLR
40.0000 mg | Freq: Every day | INTRAVENOUS | Status: DC
  Filled 2017-11-18: qty 40

## 2017-11-18 MED ORDER — METHYLPREDNISOLONE SODIUM SUCC 125 MG IJ SOLR
125.0000 mg | Freq: Once | INTRAMUSCULAR | Status: AC
  Administered 2017-11-18: 125 mg via INTRAVENOUS
  Filled 2017-11-18: qty 2

## 2017-11-18 MED ORDER — TAMSULOSIN HCL 0.4 MG PO CAPS
0.4000 mg | ORAL_CAPSULE | Freq: Every day | ORAL | Status: DC
  Administered 2017-11-18 – 2017-11-27 (×10): 0.4 mg via ORAL
  Filled 2017-11-18 (×10): qty 1

## 2017-11-18 NOTE — ED Notes (Signed)
Pt alert and oriented, feeling better and breathing easier per pt

## 2017-11-18 NOTE — ED Notes (Signed)
Dr Quentin Cornwall at bedside to insert chest tube

## 2017-11-18 NOTE — ED Triage Notes (Signed)
Pt came to ED via EMS from home c/o sob. History of copd, triple bypass and pneumothorax. Pt reports o2 levels in 80s this morning. Normally always on 2L. Currently 96% on 2 L. Pt has breathing treatment at 0700. HR 108 on arrival

## 2017-11-18 NOTE — H&P (Signed)
Date of Admission:  11/18/2017  Reason for Admission:  Left pneumothorax  History of Present Illness: Thomas Trujillo is a 72 y.o. male who presents with a one day history of worsening left chest pain and shortness of breath.  He had been admitted on 05/2017 with the same symptoms and had a left pneumothorax.  He reports he was awake today at 3 am and started having pain and shortness of breath. He also started having sweats.  He presented to the ED this morning and was found to have another left pneumothorax.  Chest tube was placed with near complete re-expansion.  Right now he feels his pain and shortness of breath are much better though feels like "someone is stepping on my rib" where the chest tube is.  Past Medical History: Past Medical History:  Diagnosis Date  . Bleb, lung ([place])   . COPD (chronic obstructive pulmonary disease) ([place])   . Coronary artery disease 02/14/2017   NSTEMI with urgent CABG (LIMA-LAD and SVG->ramus)  . Hyperlipidemia   . Ischemic cardiomyopathy   . Patient denies medical problems   . Pneumothorax 05/2017   Left     Past Surgical History: Past Surgical History:  Procedure Laterality Date  . ABDOMINAL HYSTERECTOMY    . CATARACT EXTRACTION, BILATERAL    . CORONARY ARTERY BYPASS GRAFT N/A 02/14/2017   Procedure: CORONARY ARTERY BYPASS GRAFTING (CABG) x 2 , using left internal mammary artery and right leg greater saphenous vein harvested endoscopically LIMA-LAD, SVG-RAMUS;  Surgeon: [name], MD;  Location: [facility];  Service: Open Heart Surgery;  Laterality: N/A;  . ENDOVEIN HARVEST OF GREATER SAPHENOUS VEIN Right 02/14/2017   Procedure: ENDOVEIN HARVEST OF RIGHT THIGH GREATER SAPHENOUS VEIN;  Surgeon: [name], MD;  Location: [facility];  Service: Open Heart Surgery;  Laterality: Right;  . LEFT HEART CATH AND CORONARY ANGIOGRAPHY N/A 02/14/2017   Procedure: Left Heart Cath and Coronary Angiography;  Surgeon: [name], MD;  Location: [facility]  CV [facility];  Service: Cardiovascular;  Laterality: N/A;  . NASAL POLYP SURGERY    . STAPLING OF BLEBS N/A 02/14/2017   Procedure: STAPLING OF LARGE LEFT UPPER LOBE PULMONARY BLEB;  Surgeon: [name], MD;  Location: [facility];  Service: Open Heart Surgery;  Laterality: N/A;  . TEE WITHOUT CARDIOVERSION N/A 02/14/2017   Procedure: TRANSESOPHAGEAL ECHOCARDIOGRAM (TEE);  Surgeon: [name], MD;  Location: [facility];  Service: Open Heart Surgery;  Laterality: N/A;    Home Medications: Prior to Admission medications   Medication Sig Start Date End Date Taking? Authorizing Provider  aspirin EC 81 MG tablet Take 1 tablet (81 mg total) by mouth daily. 11/08/17  Yes End, Harrell Gave, MD  atorvastatin (LIPITOR) 40 MG tablet Take 40 mg by mouth daily.   Yes [provider]  budesonide (PULMICORT) 0.5 MG/2ML nebulizer solution Take 0.5 mg by nebulization daily.   Yes [provider]  ferrous sulfate 325 (65 FE) MG tablet Take 325 mg by mouth daily with breakfast.   Yes [provider]  furosemide (LASIX) 20 MG tablet TAKE 1-2 TABS AS DIRECTED ONCE DAILY WITH ADDITIONAL DOSE IF GAINS 2 POUNDS IN A DAY OR 5 IN A WEEK 10/25/17  Yes End, Harrell Gave, MD  ipratropium-albuterol (DUONEB) 0.5-2.5 (3) MG/3ML SOLN Take 3 mLs by nebulization every 6 (six) hours as needed. When awake    Yes [provider]  Multiple Vitamins-Minerals (MULTIVITAMIN ADULT PO) Take 1 tablet by mouth daily.   Yes  [provider]  potassium chloride (K-DUR) 10 MEQ tablet Take 10 mEq by mouth daily.   Yes [provider]  tamsulosin (FLOMAX) 0.4 MG CAPS capsule Take 1 capsule (0.4 mg total) by mouth daily. 02/25/17  Yes Conte, Tessa N, PA-C  OXYGEN Inhale into the lungs. By nasal cannula and wean as tolerated to keep O2 sats >90%    [provider]    Allergies: Allergies  Allergen Reactions  . No Known Allergies     Social History:  reports that he quit smoking about 10  years ago. he has never used smokeless tobacco. He reports that he does not drink alcohol or use drugs.   Family History: Family History  Problem Relation Age of Onset  . Obesity Son     Review of Systems: Review of Systems  Constitutional: Positive for diaphoresis. Negative for chills and fever.  HENT: Negative for hearing loss.   Respiratory: Positive for shortness of breath.   Cardiovascular: Positive for chest pain.  Gastrointestinal: Negative for abdominal pain, nausea and vomiting.  Genitourinary: Negative for dysuria.  Musculoskeletal: Negative for myalgias.  Skin: Negative for rash.  Neurological: Negative for dizziness.  Psychiatric/Behavioral: Negative for depression.  All other systems reviewed and are negative.   Physical Exam BP 109/75   Pulse (!) 108   Temp 98.2 F (36.8 C) (Oral)   Resp 19   Ht 5\' 4"  (1.626 m)   Wt 68.9 kg (152 lb)   SpO2 94%   BMI 26.09 kg/m  CONSTITUTIONAL: No acute distress HEENT:  Normocephalic, atraumatic, extraocular motion intact. NECK: Trachea is midline, and there is no jugular venous distension.  RESPIRATORY:  Left chest tube in place with small air leak in pleuravac.  No respiratory distress CARDIOVASCULAR: Heart is regular without murmurs, gallops, or rubs. GI: The abdomen is soft, nondistended, nontender.  MUSCULOSKELETAL:  Normal muscle strength and tone in all four extremities.  No peripheral edema or cyanosis. SKIN: Skin turgor is normal. There are no pathologic skin lesions.  NEUROLOGIC:  Motor and sensation is grossly normal.  Cranial nerves are grossly intact. PSYCH:  Alert and oriented to person, place and time. Affect is normal.  Laboratory Analysis: Results for orders placed or performed during the hospital encounter of 11/18/17 (from the past 24 hour(s))  Basic metabolic panel     Status: Abnormal   Collection Time: 11/18/17  9:05 AM  Result Value Ref Range   Sodium 137 135 - 145 mmol/L   Potassium 4.5 3.5 - 5.1  mmol/L   Chloride 105 101 - 111 mmol/L   CO2 23 22 - 32 mmol/L   Glucose, Bld 119 (H) 65 - 99 mg/dL   BUN 35 (H) 6 - 20 mg/dL   Creatinine, Ser 1.14 0.61 - 1.24 mg/dL   Calcium 9.3 8.9 - 10.3 mg/dL   GFR calc non Af Amer >60 >60 mL/min   GFR calc Af Amer >60 >60 mL/min   Anion gap 9 5 - 15  CBC     Status: Abnormal   Collection Time: 11/18/17  9:05 AM  Result Value Ref Range   WBC 8.1 3.8 - 10.6 K/uL   RBC 5.08 4.40 - 5.90 MIL/uL   Hemoglobin 15.9 13.0 - 18.0 g/dL   HCT 47.3 40.0 - 52.0 %   MCV 93.1 80.0 - 100.0 fL   MCH 31.2 26.0 - 34.0 pg   MCHC 33.6 32.0 - 36.0 g/dL   RDW 15.0 (H) 11.5 - 14.5 %  Platelets 185 150 - 440 K/uL  Troponin I     Status: None   Collection Time: 11/18/17  9:05 AM  Result Value Ref Range   Troponin I <0.03 <0.03 ng/mL    Imaging: Dg Chest 2 View  Result Date: 11/18/2017 CLINICAL DATA:  Increasing shortness of breath EXAM: CHEST  2 VIEW COMPARISON:  09/13/2017 FINDINGS: Cardiac shadow is stable. Postsurgical changes are noted. Chronic changes are seen in the right lung. There is a large left pneumothorax identified. No bony abnormality is seen. IMPRESSION: Large spontaneous left pneumothorax. Chronic changes in the right lung. Critical Value/emergent results were called by telephone at the time of interpretation on 11/18/2017 at 10:03 am to Dr. Merlyn Lot , who verbally acknowledged these results. Electronically Signed   By: Inez Catalina M.D.   On: 11/18/2017 10:04   Dg Chest Portable 1 View  Result Date: 11/18/2017 CLINICAL DATA:  Status post left-sided chest tube placement for pneumothorax. EXAM: PORTABLE CHEST 1 VIEW COMPARISON:  11/18/2017 at 0945 hours FINDINGS: Small caliber left-sided thoracostomy tube has been placed. The left lung shows near complete re-expansion with tiny apical and lateral basilar component of pneumothorax remaining. Severe emphysematous lung disease again noted. No pneumomediastinum or mediastinal shift. IMPRESSION: Near  complete re-expansion of the left lung following small caliber chest tube placement. Small apical and lateral basilar component of pneumothorax remains. Stable severe emphysema. Electronically Signed   By: Aletta Edouard M.D.   On: 11/18/2017 11:06    Assessment and Plan: This is a 72 y.o. male who presents with a new spontaneous left pneumothorax.  I have independently viewed the patient's imaging studies and reviewed his laboratory studies.  He had a large left pneumothorax on presentation with almost complete resolution after chest tube placement.  His laboratory workup is overall normal.  He feels better now.  Will admit the patient to surgical team.  Discussed with patient that he would have chest tube to suction for 48 hours prior to placing to waterseal.  Will also discuss with Dr. Genevive Bi on Monday 1/28 when he returns about any other procedures he may require or if pleurodesis would be a reasonable option for him.  He will have a regular diet and pain control.  His home medications will be restarted except Aspirin for now in case of any other procedures are needed.  Patient understands this plan and all of his questions have been answered.   Melvyn Neth, [place]

## 2017-11-18 NOTE — ED Notes (Signed)
PT given ginger ale. 

## 2017-11-18 NOTE — ED Provider Notes (Signed)
[facility] Emergency Department Provider Note    First MD Initiated Contact with Patient 11/18/17 [phone]     (approximate)  I have reviewed the triage vital signs and the nursing notes.   HISTORY  Chief Complaint Shortness of Breath    HPI Thomas Trujillo is a 72 y.o. male history of COPD on 2 L nasal cannula at home presents with sudden onset shortness of breath that awoke patient from sleep.  Patient does have a history of spontaneous pneumothorax and feels that this feels similar.  Patient was found to be hypoxic to the low 80s on his home pulse oximeter meeting.  Denies any fevers.  Does have some pain and discomfort with deep inspiration.  No abdominal pain.  No lower extremity swelling.  Past Medical History:  Diagnosis Date  . Bleb, lung ([place])   . COPD (chronic obstructive pulmonary disease) ([place])   . Coronary artery disease 02/14/2017   NSTEMI with urgent CABG (LIMA-LAD and SVG->ramus)  . Hyperlipidemia   . Ischemic cardiomyopathy   . Patient denies medical problems   . Pneumothorax 05/2017   Left   Family History  Problem Relation Age of Onset  . Obesity Son    Past Surgical History:  Procedure Laterality Date  . ABDOMINAL HYSTERECTOMY    . CATARACT EXTRACTION, BILATERAL    . CORONARY ARTERY BYPASS GRAFT N/A 02/14/2017   Procedure: CORONARY ARTERY BYPASS GRAFTING (CABG) x 2 , using left internal mammary artery and right leg greater saphenous vein harvested endoscopically LIMA-LAD, SVG-RAMUS;  Surgeon: [name], MD;  Location: [facility];  Service: Open Heart Surgery;  Laterality: N/A;  . ENDOVEIN HARVEST OF GREATER SAPHENOUS VEIN Right 02/14/2017   Procedure: ENDOVEIN HARVEST OF RIGHT THIGH GREATER SAPHENOUS VEIN;  Surgeon: [name], MD;  Location: [facility];  Service: Open Heart Surgery;  Laterality: Right;  . LEFT HEART CATH AND CORONARY ANGIOGRAPHY N/A 02/14/2017   Procedure: Left Heart Cath and Coronary Angiography;  Surgeon:  [name], MD;  Location: [facility] CV LAB;  Service: Cardiovascular;  Laterality: N/A;  . NASAL POLYP SURGERY    . STAPLING OF BLEBS N/A 02/14/2017   Procedure: STAPLING OF LARGE LEFT UPPER LOBE PULMONARY BLEB;  Surgeon: [name], MD;  Location: [facility];  Service: Open Heart Surgery;  Laterality: N/A;  . TEE WITHOUT CARDIOVERSION N/A 02/14/2017   Procedure: TRANSESOPHAGEAL ECHOCARDIOGRAM (TEE);  Surgeon: [name], MD;  Location: [facility];  Service: Open Heart Surgery;  Laterality: N/A;   Patient Active Problem List   Diagnosis Date Noted  . Dyspnea on exertion 09/14/2017  . Chronic obstructive pulmonary disease ([place]) 06/07/2017  . Pneumothorax 05/30/2017  . Ischemic cardiomyopathy 04/05/2017  . Hyperlipidemia LDL goal <70 04/05/2017  . Coronary artery disease involving native coronary artery of native heart without angina pectoris 02/24/2017  . 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 NSTEMI (non-ST elevated myocardial infarction) ([place]) 02/13/2017      Prior to Admission medications   Medication Sig Start Date End Date Taking? Authorizing Provider  aspirin EC 81 MG tablet Take 1 tablet (81 mg total) by mouth daily. 11/08/17  Yes End, Harrell Gave, MD  atorvastatin (LIPITOR) 40 MG tablet Take 40 mg by mouth daily.   Yes [provider]  budesonide (PULMICORT) 0.5 MG/2ML nebulizer solution Take 0.5 mg by nebulization daily.   Yes [provider]  ferrous sulfate 325 (65 FE) MG tablet Take 325 mg by mouth daily with breakfast.  Yes [provider]  furosemide (LASIX) 20 MG tablet TAKE 1-2 TABS AS DIRECTED ONCE DAILY WITH ADDITIONAL DOSE IF GAINS 2 POUNDS IN A DAY OR 5 IN A WEEK 10/25/17  Yes End, Harrell Gave, MD  ipratropium-albuterol (DUONEB) 0.5-2.5 (3) MG/3ML SOLN Take 3 mLs by nebulization every 6 (six) hours as needed. When awake    Yes [provider]  Multiple Vitamins-Minerals (MULTIVITAMIN ADULT PO) Take 1 tablet by mouth daily.   Yes  [provider]  potassium chloride (K-DUR) 10 MEQ tablet Take 10 mEq by mouth daily.   Yes [provider]  tamsulosin (FLOMAX) 0.4 MG CAPS capsule Take 1 capsule (0.4 mg total) by mouth daily. 02/25/17  Yes Conte, Tessa N, PA-C  bisacodyl (DULCOLAX) 5 MG EC tablet Take 2 tablets (10 mg total) by mouth daily as needed for moderate constipation (Give daily if no BM). Patient not taking: Reported on 11/18/2017 02/26/17   Elgie Collard, PA-C  OXYGEN Inhale into the lungs. By nasal cannula and wean as tolerated to keep O2 sats >90%    [provider]    Allergies No known allergies    Social History Social History   Tobacco Use  . Smoking status: Former Smoker    Last attempt to quit: 04/06/2007    Years since quitting: 10.6  . Smokeless tobacco: Never Used  Substance Use Topics  . Alcohol use: No  . Drug use: No    Review of Systems Patient denies headaches, rhinorrhea, blurry vision, numbness, shortness of breath, chest pain, edema, cough, abdominal pain, nausea, vomiting, diarrhea, dysuria, fevers, rashes or hallucinations unless otherwise stated above in HPI. ____________________________________________   PHYSICAL EXAM:  VITAL SIGNS: Vitals:   11/18/17 0907 11/18/17 0930  BP: 114/72 109/75  Pulse: (!) 111 (!) 108  Resp: 18 19  Temp: 98.2 F (36.8 C)   SpO2: 97% 94%    Constitutional: Alert and oriented.  Acutely ill-appearing in moderate respiratory distress Eyes: Conjunctivae are normal.  Head: Atraumatic. Nose: No congestion/rhinnorhea. Mouth/Throat: Mucous membranes are moist.   Neck: No stridor. Painless ROM.  Cardiovascular: Normal rate, regular rhythm. Grossly normal heart sounds.  Good peripheral circulation. Respiratory:   Minutes breath sounds bilaterally left greater than right.  Moderate respiratory distress. Gastrointestinal: Soft and nontender. No distention. No abdominal bruits. No CVA tenderness. Genitourinary:    Musculoskeletal: No lower extremity tenderness nor edema.  No joint effusions. Neurologic:  Normal speech and language. No gross focal neurologic deficits are appreciated. No facial droop Skin:  Skin is warm, dry and intact. No rash noted. Psychiatric: Mood and affect are normal. Speech and behavior are normal.  ____________________________________________   LABS (all labs ordered are listed, but only abnormal results are displayed)  Results for orders placed or performed during the hospital encounter of 11/18/17 (from the past 24 hour(s))  Basic metabolic panel     Status: Abnormal   Collection Time: 11/18/17  9:05 AM  Result Value Ref Range   Sodium 137 135 - 145 mmol/L   Potassium 4.5 3.5 - 5.1 mmol/L   Chloride 105 101 - 111 mmol/L   CO2 23 22 - 32 mmol/L   Glucose, Bld 119 (H) 65 - 99 mg/dL   BUN 35 (H) 6 - 20 mg/dL   Creatinine, Ser 1.14 0.61 - 1.24 mg/dL   Calcium 9.3 8.9 - 10.3 mg/dL   GFR calc non Af Amer >60 >60 mL/min   GFR calc Af Amer >60 >60 mL/min  Anion gap 9 5 - 15  CBC     Status: Abnormal   Collection Time: 11/18/17  9:05 AM  Result Value Ref Range   WBC 8.1 3.8 - 10.6 K/uL   RBC 5.08 4.40 - 5.90 MIL/uL   Hemoglobin 15.9 13.0 - 18.0 g/dL   HCT 47.3 40.0 - 52.0 %   MCV 93.1 80.0 - 100.0 fL   MCH 31.2 26.0 - 34.0 pg   MCHC 33.6 32.0 - 36.0 g/dL   RDW 15.0 (H) 11.5 - 14.5 %   Platelets 185 150 - 440 K/uL  Troponin I     Status: None   Collection Time: 11/18/17  9:05 AM  Result Value Ref Range   Troponin I <0.03 <0.03 ng/mL   ____________________________________________  EKG My review and personal interpretation at Time: 9:05   Indication: sob  Rate: 110  Rhythm: sinus Axis: normal Other: normal intervals, no stemi ____________________________________________  RADIOLOGY  I personally reviewed all radiographic images ordered to evaluate for the above acute complaints and reviewed radiology reports and findings.  These findings were personally  discussed with the patient.  Please see medical record for radiology report.  ____________________________________________   PROCEDURES  Procedure(s) performed:  .Critical Care Performed by: Merlyn Lot, MD Authorized by: Merlyn Lot, MD   Critical care provider statement:    Critical care time (minutes):  30   Critical care time was exclusive of:  Separately billable procedures and treating other patients   Critical care was necessary to treat or prevent imminent or life-threatening deterioration of the following conditions:  Respiratory failure   Critical care was time spent personally by me on the following activities:  Development of treatment plan with patient or surrogate, discussions with consultants, evaluation of patient's response to treatment, examination of patient, obtaining history from patient or surrogate, ordering and performing treatments and interventions, ordering and review of laboratory studies, ordering and review of radiographic studies, pulse oximetry, re-evaluation of patient's condition and review of old charts CHEST TUBE INSERTION Date/Time: 11/18/2017 11:18 AM Performed by: Merlyn Lot, MD Authorized by: Merlyn Lot, MD   Consent:    Consent obtained:  Emergent situation and verbal   Consent given by:  Patient   Risks discussed:  Incomplete drainage, bleeding, nerve damage, pain, infection and damage to surrounding structures   Alternatives discussed:  No treatment Pre-procedure details:    Skin preparation:  ChloraPrep Sedation:    Sedation type:  Anxiolysis Anesthesia (see MAR for exact dosages):    Anesthesia method:  Local infiltration   Local anesthetic:  Bupivacaine 0.5% w/o epi Procedure details:    Placement location:  L lateral   Scalpel size:  11   Tube size (Fr):  Minicatheter   Ultrasound guidance: no     Tension pneumothorax: no     Tube connected to:  Heimlich valve, suction and water seal   Drainage  characteristics:  Air only   Suture material:  2-0 silk   Dressing:  4x4 sterile gauze and Xeroform gauze Post-procedure details:    Post-insertion x-ray findings: tube in good position     Patient tolerance of procedure:  Tolerated well, no immediate complications      Critical Care performed: yes ____________________________________________   INITIAL IMPRESSION / ASSESSMENT AND PLAN / ED COURSE  Pertinent labs & imaging results that were available during my care of the patient were reviewed by me and considered in my medical decision making (see chart for details).  DDX: Asthma, copd,  CHF, pna, ptx, malignancy, Pe, anemia   Regina Coppolino is a 72 y.o. who presents to the ED with acute respiratory distress with hypoxia.  Patient placed on supplemental oxygen.  Currently protecting his airway.  Did not diminished breath sounds and concern for above differential patient taken emergently to chest x-ray which shows evidence of left-sided pneumothorax with tension physiology.  Chest tube placed with significant improvement in symptoms.  Most likely secondary to underlying COPD with blebs.  Have discussed with the patient and available family all diagnostics and treatments performed thus far and all questions were answered to the best of my ability. The patient demonstrates understanding and agreement with plan.       ____________________________________________   FINAL CLINICAL IMPRESSION(S) / ED DIAGNOSES  Final diagnoses:  Pneumothorax on left  Acute respiratory failure with hypoxia (HCC)      NEW MEDICATIONS STARTED DURING THIS VISIT:  New Prescriptions   No medications on file     Note:  This document was prepared using Dragon voice recognition software and may include unintentional dictation errors.    Merlyn Lot, MD 11/18/17 [phone]

## 2017-11-19 ENCOUNTER — Inpatient Hospital Stay: Payer: Medicare Other

## 2017-11-19 MED ORDER — BUDESONIDE 0.5 MG/2ML IN SUSP: 0.5000 mg | Freq: Every day | RESPIRATORY_TRACT | Status: DC

## 2017-11-19 MED ORDER — BUDESONIDE 0.5 MG/2ML IN SUSP
0.5000 mg | Freq: Every day | RESPIRATORY_TRACT | Status: DC
  Administered 2017-11-20 – 2017-11-27 (×8): 0.5 mg via RESPIRATORY_TRACT
  Filled 2017-11-19 (×9): qty 2

## 2017-11-19 MED ORDER — IPRATROPIUM-ALBUTEROL 0.5-2.5 (3) MG/3ML IN SOLN
3.0000 mL | Freq: Four times a day (QID) | RESPIRATORY_TRACT | Status: DC
  Administered 2017-11-19 – 2017-11-22 (×11): 3 mL via RESPIRATORY_TRACT
  Filled 2017-11-19 (×11): qty 3

## 2017-11-19 MED ORDER — PANTOPRAZOLE SODIUM 40 MG PO TBEC
40.0000 mg | DELAYED_RELEASE_TABLET | Freq: Every day | ORAL | Status: DC
  Administered 2017-11-20 – 2017-11-26 (×7): 40 mg via ORAL
  Filled 2017-11-19 (×8): qty 1

## 2017-11-19 MED ORDER — BUDESONIDE 0.5 MG/2ML IN SUSP
0.5000 mg | Freq: Every day | RESPIRATORY_TRACT | Status: DC
  Administered 2017-11-19: 0.5 mg via RESPIRATORY_TRACT
  Filled 2017-11-19 (×2): qty 2

## 2017-11-19 MED ORDER — IPRATROPIUM-ALBUTEROL 0.5-2.5 (3) MG/3ML IN SOLN
3.0000 mL | RESPIRATORY_TRACT | Status: DC | PRN
  Administered 2017-11-23 – 2017-11-24 (×2): 3 mL via RESPIRATORY_TRACT
  Filled 2017-11-19: qty 3

## 2017-11-19 NOTE — Progress Notes (Signed)
On call surgeon was made aware of pt having a minimal air leak, no new orders given at this time. Will continue to monitor pt closely.   Adileny Delon [employer]

## 2017-11-19 NOTE — Progress Notes (Signed)
11/19/2017  Subjective: No acute events overnight.  Patient's pain from the chest tube is better controlled today.  Denies any shortness of breath.  Vital signs: Temp:  [97.5 F (36.4 C)-97.7 F (36.5 C)] 97.7 F (36.5 C) (01/27 0431) Pulse Rate:  [80-108] 80 (01/27 0431) Resp:  [14-22] 20 (01/27 0431) BP: (109-123)/(54-83) 118/55 (01/27 0431) SpO2:  [90 %-100 %] 97 % (01/27 0431)   Intake/Output: 01/26 0701 - 01/27 0700 In: 360 [P.O.:360] Out: 300 [Urine:300] Last BM Date: 11/18/17  Physical Exam: Constitutional: No acute distress Respiratory: Left-sided chest tube in place to suction.  There is a very small air leak with forceful coughing.  Lungs clear bilaterally.  Labs:  Recent Labs    11/18/17 0905  WBC 8.1  HGB 15.9  HCT 47.3  PLT 185   Recent Labs    11/18/17 0905  NA 137  K 4.5  CL 105  CO2 23  GLUCOSE 119*  BUN 35*  CREATININE 1.14  CALCIUM 9.3   No results for input(s): LABPROT, INR in the last 72 hours.  Imaging: Dg Chest 2 View  Result Date: 11/18/2017 CLINICAL DATA:  Increasing shortness of breath EXAM: CHEST  2 VIEW COMPARISON:  09/13/2017 FINDINGS: Cardiac shadow is stable. Postsurgical changes are noted. Chronic changes are seen in the right lung. There is a large left pneumothorax identified. No bony abnormality is seen. IMPRESSION: Large spontaneous left pneumothorax. Chronic changes in the right lung. Critical Value/emergent results were called by telephone at the time of interpretation on 11/18/2017 at 10:03 am to Dr. Merlyn Lot , who verbally acknowledged these results. Electronically Signed   By: Inez Catalina M.D.   On: 11/18/2017 10:04   Dg Chest Port 1 View  Result Date: 11/19/2017 CLINICAL DATA:  Follow-up chest tube placement for pneumothorax. EXAM: PORTABLE CHEST 1 VIEW COMPARISON:  11/18/2017 FINDINGS: Background severe emphysema. Left chest tube remains in place directed towards the apex. The lung is further re-expanded without  definitely identifiable pleural air. Minimal atelectasis at the left base. Previous median sternotomy and CABG. Normal heart size. Atherosclerosis. IMPRESSION: Further re-expansion without definite identifiable pleural air on the left. Mild left base atelectasis. Electronically Signed   By: Nelson Chimes M.D.   On: 11/19/2017 06:45   Dg Chest Portable 1 View  Result Date: 11/18/2017 CLINICAL DATA:  Status post left-sided chest tube placement for pneumothorax. EXAM: PORTABLE CHEST 1 VIEW COMPARISON:  11/18/2017 at 0945 hours FINDINGS: Small caliber left-sided thoracostomy tube has been placed. The left lung shows near complete re-expansion with tiny apical and lateral basilar component of pneumothorax remaining. Severe emphysematous lung disease again noted. No pneumomediastinum or mediastinal shift. IMPRESSION: Near complete re-expansion of the left lung following small caliber chest tube placement. Small apical and lateral basilar component of pneumothorax remains. Stable severe emphysema. Electronically Signed   By: Aletta Edouard M.D.   On: 11/18/2017 11:06    Assessment/Plan: 72 year old male with left-sided pneumothorax.  -Chest x-ray this morning independently viewed by me.  Patient no longer has a pneumothorax and the chest tube is working.  There is no effusion or evidence of pneumonia. -Continue chest tube to suction today given that he still has a very small air leak.  Will discuss with Dr. Genevive Bi tomorrow regarding any further management options. -Out of bed, ambulate.  May place to waterseal briefly only for ambulation.   Melvyn Neth, [place]

## 2017-11-19 NOTE — Progress Notes (Signed)
PHARMACIST - PHYSICIAN COMMUNICATION  CONCERNING: IV to Oral Route Change Policy  RECOMMENDATION: This patient is receiving pantoprazole by the intravenous route.  Based on criteria approved by the Pharmacy and Therapeutics Committee, the intravenous medication(s) is/are being converted to the equivalent oral dose form(s).   DESCRIPTION: These criteria include:  The patient is eating (either orally or via tube) and/or has been taking other orally administered medications for a least 24 hours  The patient has no evidence of active gastrointestinal bleeding or impaired GI absorption (gastrectomy, short bowel, patient on TNA or NPO).  If you have questions about this conversion, please contact the Pharmacy Department  []   [phone] )  [facility] [x]   [phone] )  [facility] []   [phone] )  [facility] []   [phone] )  [facility] []   [phone] )  [place], [facility] 11/19/2017 5:55 PM

## 2017-11-20 ENCOUNTER — Inpatient Hospital Stay: Payer: Medicare Other

## 2017-11-20 NOTE — Progress Notes (Signed)
  Patient ID: Thomas Trujillo, male   DOB: [birth year], 72 y.o.   MRN: [id number]  HISTORY: Patient well known to me.  Admitted with 2nd left sided pneumothorax after CABG and incidental wedge LUL for bullous emphysema (allow for LIMA).  Admitted with spontaneous Left pneumothorax and treated with small bore chest tube.  Currently resting comfortably.  Not short of breath.  Still has air leak with cough.   Vitals:   11/20/17 0437 11/20/17 0704  BP: (!) 129/59   Pulse: 95   Resp: 18   Temp: 98 F (36.7 C)   SpO2: 97% 96%     EXAM:    Resp: Lungs are very distant.  No respiratory distress, normal effort. Heart:  Regular without murmurs Abd:  Abdomen is soft, non distended and non tender. No masses are palpable.  There is no rebound and no guarding.  Neurological: Alert and oriented to person, place, and time. Coordination normal.  Skin: Skin is warm and dry. No rash noted. No diaphoretic. No erythema. No pallor.  Psychiatric: Normal mood and affect. Normal behavior. Judgment and thought content normal.   There is a moderate air leak with cough or Valsalva   ASSESSMENT: Independent review of CXRay shows no pneumothorax.  Still on suction.     PLAN:   I reviewed option of VATS with talc versus "wait and watch".  I called Dr. Everrett Coombe office to review his intraoperative findings.  He will get back in touch with me.  Keep on suction for today.    Nestor Lewandowsky, MD

## 2017-11-20 NOTE — Progress Notes (Signed)
CC: PTX Subjective: Some dyspnea on exertion CXR pers. Reviewed. Left small PTX AVSS  Objective: Vital signs in last 24 hours: Temp:  [97.5 F (36.4 C)-98 F (36.7 C)] 98 F (36.7 C) (01/28 0437) Pulse Rate:  [78-95] 95 (01/28 0437) Resp:  [16-20] 18 (01/28 0437) BP: (107-129)/(48-59) 129/59 (01/28 0437) SpO2:  [96 %-100 %] 96 % (01/28 0704) Last BM Date: 11/18/17  Intake/Output from previous day: 01/27 0701 - 01/28 0700 In: 480 [P.O.:480] Out: 5 [Chest Tube:5] Intake/Output this shift: No intake/output data recorded.  Physical exam: NAD, wearing O2. Chest: CTA, CT in place, + air leak w valsalva. Abd: soft, NT Ext: well perfused and no edema Lab Results: CBC  Recent Labs    11/18/17 0905  WBC 8.1  HGB 15.9  HCT 47.3  PLT 185   BMET Recent Labs    11/18/17 0905  NA 137  K 4.5  CL 105  CO2 23  GLUCOSE 119*  BUN 35*  CREATININE 1.14  CALCIUM 9.3   PT/INR No results for input(s): LABPROT, INR in the last 72 hours. ABG No results for input(s): PHART, HCO3 in the last 72 hours.  Invalid input(s): PCO2, PO2  Studies/Results: Dg Chest 2 View  Result Date: 11/18/2017 CLINICAL DATA:  Increasing shortness of breath EXAM: CHEST  2 VIEW COMPARISON:  09/13/2017 FINDINGS: Cardiac shadow is stable. Postsurgical changes are noted. Chronic changes are seen in the right lung. There is a large left pneumothorax identified. No bony abnormality is seen. IMPRESSION: Large spontaneous left pneumothorax. Chronic changes in the right lung. Critical Value/emergent results were called by telephone at the time of interpretation on 11/18/2017 at 10:03 am to Dr. Merlyn Lot , who verbally acknowledged these results. Electronically Signed   By: Inez Catalina M.D.   On: 11/18/2017 10:04   Dg Chest Port 1 View  Result Date: 11/20/2017 CLINICAL DATA:  Left pneumothorax EXAM: PORTABLE CHEST 1 VIEW COMPARISON:  11/19/2017 FINDINGS: Small caliber left thoracostomy remains in place.  Tiny left apical pneumothorax is present, about 1%. Hyperaeration and emphysema persist. No right pneumothorax. Normal heart size. Postoperative changes from CABG. IMPRESSION: Tiny left apical pneumothorax, about 1%.  Stable left chest tube. Electronically Signed   By: Marybelle Killings M.D.   On: 11/20/2017 07:10   Dg Chest Port 1 View  Result Date: 11/19/2017 CLINICAL DATA:  Follow-up chest tube placement for pneumothorax. EXAM: PORTABLE CHEST 1 VIEW COMPARISON:  11/18/2017 FINDINGS: Background severe emphysema. Left chest tube remains in place directed towards the apex. The lung is further re-expanded without definitely identifiable pleural air. Minimal atelectasis at the left base. Previous median sternotomy and CABG. Normal heart size. Atherosclerosis. IMPRESSION: Further re-expansion without definite identifiable pleural air on the left. Mild left base atelectasis. Electronically Signed   By: Nelson Chimes M.D.   On: 11/19/2017 06:45   Dg Chest Portable 1 View  Result Date: 11/18/2017 CLINICAL DATA:  Status post left-sided chest tube placement for pneumothorax. EXAM: PORTABLE CHEST 1 VIEW COMPARISON:  11/18/2017 at 0945 hours FINDINGS: Small caliber left-sided thoracostomy tube has been placed. The left lung shows near complete re-expansion with tiny apical and lateral basilar component of pneumothorax remaining. Severe emphysematous lung disease again noted. No pneumomediastinum or mediastinal shift. IMPRESSION: Near complete re-expansion of the left lung following small caliber chest tube placement. Small apical and lateral basilar component of pneumothorax remains. Stable severe emphysema. Electronically Signed   By: Aletta Edouard M.D.   On: 11/18/2017 11:06  Anti-infectives: Anti-infectives (From admission, onward)   None      Assessment/Plan: Persistent Left PTX We will have Dr. Genevive Bi evaluate the pt for pleurodesis  Keep CT on suction for now D/w the pt in detail  Caroleen Hamman, MD,  FACS  11/20/2017

## 2017-11-21 LAB — CBC
HCT: 42.4 % (ref 40.0–52.0)
HEMOGLOBIN: 14.1 g/dL (ref 13.0–18.0)
MCH: 31.3 pg (ref 26.0–34.0)
MCHC: 33.3 g/dL (ref 32.0–36.0)
MCV: 93.9 fL (ref 80.0–100.0)
Platelets: 152 10*3/uL (ref 150–440)
RBC: 4.52 MIL/uL (ref 4.40–5.90)
RDW: 15 % — ABNORMAL HIGH (ref 11.5–14.5)
WBC: 8.5 10*3/uL (ref 3.8–10.6)

## 2017-11-21 LAB — CREATININE, SERUM
CREATININE: 1.2 mg/dL (ref 0.61–1.24)
GFR calc Af Amer: >60 mL/min (ref >60)
GFR, EST NON AFRICAN AMERICAN: 59 mL/min — AB (ref >60)

## 2017-11-21 LAB — SURGICAL PCR SCREEN
MRSA, PCR: NEGATIVE
STAPHYLOCOCCUS AUREUS: NEGATIVE

## 2017-11-21 MED ORDER — DEXTROSE 5 % IV SOLN
1.5000 g | INTRAVENOUS | Status: AC
  Administered 2017-11-23: 1.5 g via INTRAVENOUS
  Filled 2017-11-21: qty 1.5

## 2017-11-21 MED ORDER — HEPARIN SODIUM (PORCINE) 5000 UNIT/ML IJ SOLN
5000.0000 [IU] | Freq: Three times a day (TID) | INTRAMUSCULAR | Status: DC
  Administered 2017-11-21 (×2): 5000 [IU] via SUBCUTANEOUS
  Filled 2017-11-21 (×2): qty 1

## 2017-11-21 MED ORDER — HEPARIN SODIUM (PORCINE) 5000 UNIT/ML IJ SOLN: 5000.0000 [IU] | Freq: Three times a day (TID) | INTRAMUSCULAR | Status: DC

## 2017-11-21 NOTE — H&P (View-Only) (Signed)
  Patient ID: Thomas Trujillo, male   DOB: 16-Jul-1946, 72 y.o.   MRN: [id number]  HISTORY: Not short of breath today.  No chest pain.   Vitals:   11/20/17 2019 11/21/17 0535  BP: 129/61 125/70  Pulse: 82 94  Resp: 18 20  Temp: 98.4 F (36.9 C) 98.4 F (36.9 C)  SpO2: 98% 96%     EXAM:    Resp: Lungs are clear bilaterally but very distant.  No respiratory distress, normal effort. Heart:  Regular without murmurs Abd:  Abdomen is soft, non distended and non tender. No masses are palpable.  There is no rebound and no guarding.  Neurological: Alert and oriented to person, place, and time. Coordination normal.  Skin: Skin is warm and dry. No rash noted. No diaphoretic. No erythema. No pallor.  Psychiatric: Normal mood and affect. Normal behavior. Judgment and thought content normal.    ASSESSMENT: Still has an air leak.   PLAN:   Discussed options with patient.  Reviewed case with Dr. Servando Snare at [facility].  I have recommended that he undergo thoracoscopic talc insufflation.  Risks discussed as well as options.  He is agreeable.      Nestor Lewandowsky, MD

## 2017-11-21 NOTE — Progress Notes (Signed)
  Patient ID: Thomas Trujillo, male   DOB: 07/01/1946, 72 y.o.   MRN: [id number]  HISTORY: Not short of breath today.  No chest pain.   Vitals:   11/20/17 2019 11/21/17 0535  BP: 129/61 125/70  Pulse: 82 94  Resp: 18 20  Temp: 98.4 F (36.9 C) 98.4 F (36.9 C)  SpO2: 98% 96%     EXAM:    Resp: Lungs are clear bilaterally but very distant.  No respiratory distress, normal effort. Heart:  Regular without murmurs Abd:  Abdomen is soft, non distended and non tender. No masses are palpable.  There is no rebound and no guarding.  Neurological: Alert and oriented to person, place, and time. Coordination normal.  Skin: Skin is warm and dry. No rash noted. No diaphoretic. No erythema. No pallor.  Psychiatric: Normal mood and affect. Normal behavior. Judgment and thought content normal.    ASSESSMENT: Still has an air leak.   PLAN:   Discussed options with patient.  Reviewed case with Dr. Servando Snare at [facility].  I have recommended that he undergo thoracoscopic talc insufflation.  Risks discussed as well as options.  He is agreeable.      Nestor Lewandowsky, MD

## 2017-11-21 NOTE — Care Management (Signed)
Patient admitted with  PNX.  Currently with chest tube in place. Patient has had previous admissions with chest tubes.  Patient lives at home alone.  Son lives locally for support.  PCP [facility].  Plan for patient to undergo thoracoscopic talc insufflation.

## 2017-11-22 MED ORDER — IPRATROPIUM-ALBUTEROL 0.5-2.5 (3) MG/3ML IN SOLN
3.0000 mL | Freq: Three times a day (TID) | RESPIRATORY_TRACT | Status: DC
  Administered 2017-11-22 – 2017-11-23 (×2): 3 mL via RESPIRATORY_TRACT
  Filled 2017-11-22 (×2): qty 3

## 2017-11-22 NOTE — Progress Notes (Signed)
Per Dr. Genevive Bi surgery will be moved until tomorrow. RN to place order for regular diet and NPO after midnight.

## 2017-11-22 NOTE — Progress Notes (Signed)
Per Dr. Genevive Bi only give lasix and potassium this AM with small sip of water as pt is NPO for procedure.

## 2017-11-23 ENCOUNTER — Inpatient Hospital Stay: Payer: Medicare Other

## 2017-11-23 ENCOUNTER — Inpatient Hospital Stay: Payer: Medicare Other | Admitting: Anesthesiology

## 2017-11-23 ENCOUNTER — Encounter: Payer: Self-pay | Admitting: Cardiothoracic Surgery

## 2017-11-23 ENCOUNTER — Encounter
Admission: EM | Disposition: A | Discharge status: Home / Self Care | Payer: Self-pay | Source: Home / Self Care | Attending: Surgery

## 2017-11-23 DIAGNOSIS — J9312 Secondary spontaneous pneumothorax: Secondary | ICD-10-CM

## 2017-11-23 HISTORY — PX: VIDEO ASSISTED THORACOSCOPY (VATS) W/TALC PLEUADESIS: SHX6168

## 2017-11-23 LAB — BASIC METABOLIC PANEL
Anion gap: 9 (ref 5–15)
BUN: 34 mg/dL — AB (ref 6–20)
CHLORIDE: 101 mmol/L (ref 101–111)
CO2: 28 mmol/L (ref 22–32)
CREATININE: 1.15 mg/dL (ref 0.61–1.24)
Calcium: 8.8 mg/dL — ABNORMAL LOW (ref 8.9–10.3)
GFR calc Af Amer: >60 mL/min (ref >60)
GFR calc non Af Amer: >60 mL/min (ref >60)
GLUCOSE: 125 mg/dL — AB (ref 65–99)
Potassium: 4.3 mmol/L (ref 3.5–5.1)
Sodium: 138 mmol/L (ref 135–145)

## 2017-11-23 LAB — CBC
HCT: 43.1 % (ref 40.0–52.0)
Hemoglobin: 14.5 g/dL (ref 13.0–18.0)
MCH: 31.6 pg (ref 26.0–34.0)
MCHC: 33.7 g/dL (ref 32.0–36.0)
MCV: 93.7 fL (ref 80.0–100.0)
PLATELETS: 165 10*3/uL (ref 150–440)
RBC: 4.6 MIL/uL (ref 4.40–5.90)
RDW: 15 % — AB (ref 11.5–14.5)
WBC: 8.8 10*3/uL (ref 3.8–10.6)

## 2017-11-23 SURGERY — VIDEO ASSISTED THORACOSCOPY (VATS) W/TALC PLEUADESIS
Anesthesia: General | Wound class: Clean Contaminated

## 2017-11-23 MED ORDER — ONDANSETRON HCL 4 MG/2ML IJ SOLN: 4.0000 mg | Freq: Four times a day (QID) | INTRAMUSCULAR | Status: DC | PRN

## 2017-11-23 MED ORDER — PROPOFOL 10 MG/ML IV BOLUS
INTRAVENOUS | Status: AC
  Filled 2017-11-23: qty 20

## 2017-11-23 MED ORDER — DEXAMETHASONE SODIUM PHOSPHATE 10 MG/ML IJ SOLN
INTRAMUSCULAR | Status: AC
  Filled 2017-11-23: qty 1

## 2017-11-23 MED ORDER — ALBUTEROL SULFATE (2.5 MG/3ML) 0.083% IN NEBU
2.5000 mg | INHALATION_SOLUTION | RESPIRATORY_TRACT | Status: DC
  Administered 2017-11-23 – 2017-11-24 (×2): 2.5 mg via RESPIRATORY_TRACT
  Filled 2017-11-23 (×2): qty 3

## 2017-11-23 MED ORDER — MIDAZOLAM HCL 2 MG/2ML IJ SOLN
INTRAMUSCULAR | Status: DC | PRN
  Administered 2017-11-23: 2 mg via INTRAVENOUS

## 2017-11-23 MED ORDER — ROCURONIUM BROMIDE 50 MG/5ML IV SOLN
INTRAVENOUS | Status: AC
  Filled 2017-11-23: qty 1

## 2017-11-23 MED ORDER — FENTANYL CITRATE (PF) 100 MCG/2ML IJ SOLN
INTRAMUSCULAR | Status: AC
  Filled 2017-11-23: qty 2

## 2017-11-23 MED ORDER — DEXTROSE-NACL 5-0.45 % IV SOLN
INTRAVENOUS | Status: DC
  Administered 2017-11-23 – 2017-11-24 (×2): via INTRAVENOUS

## 2017-11-23 MED ORDER — LIDOCAINE HCL (CARDIAC) 20 MG/ML IV SOLN
INTRAVENOUS | Status: DC | PRN
  Administered 2017-11-23: 60 mg via INTRAVENOUS

## 2017-11-23 MED ORDER — ALBUTEROL SULFATE (2.5 MG/3ML) 0.083% IN NEBU: 2.5000 mg | INHALATION_SOLUTION | RESPIRATORY_TRACT | Status: DC

## 2017-11-23 MED ORDER — SUGAMMADEX SODIUM 200 MG/2ML IV SOLN
INTRAVENOUS | Status: DC | PRN
  Administered 2017-11-23: 200 mg via INTRAVENOUS

## 2017-11-23 MED ORDER — DEXTROSE 5 % IV SOLN
1.5000 g | Freq: Two times a day (BID) | INTRAVENOUS | Status: AC
  Administered 2017-11-24 (×2): 1.5 g via INTRAVENOUS
  Filled 2017-11-23 (×2): qty 1.5

## 2017-11-23 MED ORDER — BUPIVACAINE HCL (PF) 0.25 % IJ SOLN
INTRAMUSCULAR | Status: AC
  Filled 2017-11-23: qty 30

## 2017-11-23 MED ORDER — FENTANYL CITRATE (PF) 100 MCG/2ML IJ SOLN
25.0000 ug | INTRAMUSCULAR | Status: AC | PRN
  Administered 2017-11-23 (×6): 25 ug via INTRAVENOUS

## 2017-11-23 MED ORDER — BUPIVACAINE LIPOSOME 1.3 % IJ SUSP
INTRAMUSCULAR | Status: DC | PRN
  Administered 2017-11-23: 20 mL

## 2017-11-23 MED ORDER — LACTATED RINGERS IV SOLN
INTRAVENOUS | Status: DC | PRN
  Administered 2017-11-23: 13:00:00 via INTRAVENOUS

## 2017-11-23 MED ORDER — FENTANYL CITRATE (PF) 100 MCG/2ML IJ SOLN
INTRAMUSCULAR | Status: AC
  Administered 2017-11-23: 25 ug via INTRAVENOUS
  Filled 2017-11-23: qty 2

## 2017-11-23 MED ORDER — MIDAZOLAM HCL 2 MG/2ML IJ SOLN
INTRAMUSCULAR | Status: AC
  Filled 2017-11-23: qty 2

## 2017-11-23 MED ORDER — TALC (STERITALC) POWDER FOR INTRAPLEURAL USE
INTRAPLEURAL | Status: AC
  Filled 2017-11-23: qty 4

## 2017-11-23 MED ORDER — BISACODYL 5 MG PO TBEC
10.0000 mg | DELAYED_RELEASE_TABLET | Freq: Every day | ORAL | Status: DC
  Administered 2017-11-23 – 2017-11-27 (×5): 10 mg via ORAL
  Filled 2017-11-23 (×5): qty 2

## 2017-11-23 MED ORDER — ONDANSETRON HCL 4 MG/2ML IJ SOLN: 4.0000 mg | Freq: Once | INTRAMUSCULAR | Status: DC | PRN

## 2017-11-23 MED ORDER — TRAMADOL HCL 50 MG PO TABS
50.0000 mg | ORAL_TABLET | Freq: Four times a day (QID) | ORAL | Status: DC
  Administered 2017-11-23: 50 mg via ORAL
  Administered 2017-11-24 – 2017-11-27 (×14): 100 mg via ORAL
  Filled 2017-11-23 (×5): qty 2
  Filled 2017-11-23: qty 1
  Filled 2017-11-23 (×10): qty 2

## 2017-11-23 MED ORDER — SUGAMMADEX SODIUM 200 MG/2ML IV SOLN
INTRAVENOUS | Status: AC
  Filled 2017-11-23: qty 2

## 2017-11-23 MED ORDER — PROPOFOL 10 MG/ML IV BOLUS
INTRAVENOUS | Status: DC | PRN
  Administered 2017-11-23: 150 mg via INTRAVENOUS

## 2017-11-23 MED ORDER — BUPIVACAINE HCL (PF) 0.25 % IJ SOLN
INTRAMUSCULAR | Status: DC | PRN
  Administered 2017-11-23: 30 mL

## 2017-11-23 MED ORDER — ROCURONIUM BROMIDE 100 MG/10ML IV SOLN
INTRAVENOUS | Status: DC | PRN
  Administered 2017-11-23: 40 mg via INTRAVENOUS

## 2017-11-23 MED ORDER — MORPHINE SULFATE (PF) 2 MG/ML IV SOLN: 1.0000 mg | INTRAVENOUS | Status: DC | PRN

## 2017-11-23 MED ORDER — ONDANSETRON HCL 4 MG/2ML IJ SOLN
INTRAMUSCULAR | Status: AC
  Filled 2017-11-23: qty 2

## 2017-11-23 MED ORDER — PHENYLEPHRINE HCL 10 MG/ML IJ SOLN
INTRAMUSCULAR | Status: DC | PRN
  Administered 2017-11-23 (×2): 160 ug via INTRAVENOUS

## 2017-11-23 MED ORDER — ONDANSETRON HCL 4 MG/2ML IJ SOLN
INTRAMUSCULAR | Status: DC | PRN
  Administered 2017-11-23: 4 mg via INTRAVENOUS

## 2017-11-23 MED ORDER — TALC 5 G PL SUSR
INTRAPLEURAL | Status: DC | PRN
  Administered 2017-11-23: 4 g via INTRAPLEURAL

## 2017-11-23 MED ORDER — LIDOCAINE HCL (PF) 2 % IJ SOLN
INTRAMUSCULAR | Status: AC
  Filled 2017-11-23: qty 10

## 2017-11-23 MED ORDER — BUPIVACAINE LIPOSOME 1.3 % IJ SUSP
INTRAMUSCULAR | Status: AC
  Filled 2017-11-23: qty 20

## 2017-11-23 SURGICAL SUPPLY — 55 items
BNDG COHESIVE 4X5 TAN STRL (GAUZE/BANDAGES/DRESSINGS) ×2 IMPLANT
BRONCHOSCOPE PED SLIM DISP (MISCELLANEOUS) ×2 IMPLANT
CATH THOR STR 28F  SOFT WA (CATHETERS) ×1
CATH THOR STR 28F SOFT WA (CATHETERS) ×1 IMPLANT
CATH URET ROBINSON 16FR STRL (CATHETERS) IMPLANT
CHLORAPREP W/TINT 26ML (MISCELLANEOUS) ×4 IMPLANT
CUTTER ECHEON FLEX ENDO 45 340 (ENDOMECHANICALS) ×2 IMPLANT
DEFOGGER SCOPE WARMER CLEARIFY (MISCELLANEOUS) ×2 IMPLANT
DRAIN CHEST DRY SUCT SGL (MISCELLANEOUS) ×2 IMPLANT
DRAPE C-SECTION (MISCELLANEOUS) ×2 IMPLANT
DRAPE MAG INST 16X20 L/F (DRAPES) ×2 IMPLANT
DRAPE POUCH INSTRU U-SHP 10X18 (DRAPES) ×2 IMPLANT
DRSG OPSITE POSTOP 3X4 (GAUZE/BANDAGES/DRESSINGS) ×6 IMPLANT
ELECT BLADE 6 FLAT ULTRCLN (ELECTRODE) ×2 IMPLANT
ELECT BLADE 6.5 EXT (BLADE) ×2 IMPLANT
ELECT CAUTERY BLADE TIP 2.5 (TIP) ×2
ELECT REM PT RETURN 9FT ADLT (ELECTROSURGICAL) ×2
ELECTRODE CAUTERY BLDE TIP 2.5 (TIP) ×1 IMPLANT
ELECTRODE REM PT RTRN 9FT ADLT (ELECTROSURGICAL) ×1 IMPLANT
GAUZE SPONGE 4X4 12PLY STRL (GAUZE/BANDAGES/DRESSINGS) ×2 IMPLANT
GLOVE SURG SYN 7.5  E (GLOVE) ×2
GLOVE SURG SYN 7.5 E (GLOVE) ×2 IMPLANT
GOWN STRL REUS W/ TWL LRG LVL3 (GOWN DISPOSABLE) ×4 IMPLANT
GOWN STRL REUS W/TWL LRG LVL3 (GOWN DISPOSABLE) ×4
KIT RM TURNOVER STRD PROC AR (KITS) ×2 IMPLANT
LABEL OR SOLS (LABEL) ×2 IMPLANT
LOOP RED MAXI  1X406MM (MISCELLANEOUS) ×1
LOOP VESSEL MAXI 1X406 RED (MISCELLANEOUS) ×1 IMPLANT
MARKER SKIN DUAL TIP RULER LAB (MISCELLANEOUS) ×2 IMPLANT
NEEDLE SPNL 20GX3.5 QUINCKE YW (NEEDLE) ×2 IMPLANT
NS IRRIG 500ML POUR BTL (IV SOLUTION) ×2 IMPLANT
PACK BASIN MAJOR ARMC (MISCELLANEOUS) ×2 IMPLANT
SCISSORS METZENBAUM CVD 33 (INSTRUMENTS) IMPLANT
SPONGE KITTNER 5P (MISCELLANEOUS) ×4 IMPLANT
STAPLER SKIN PROX 35W (STAPLE) ×2 IMPLANT
STAPLER VASCULAR ECHELON 35 (CUTTER) ×2 IMPLANT
SUT ETHILON 4-0 (SUTURE)
SUT ETHILON 4-0 FS2 18XMFL BLK (SUTURE)
SUT MNCRL AB 3-0 PS2 27 (SUTURE) ×4 IMPLANT
SUT SILK 1 SH (SUTURE) ×6 IMPLANT
SUT VIC AB 0 CT1 36 (SUTURE) ×4 IMPLANT
SUT VIC AB 2-0 CT1 27 (SUTURE) ×2
SUT VIC AB 2-0 CT1 TAPERPNT 27 (SUTURE) ×2 IMPLANT
SUT VIC AB 2-0 CT2 27 (SUTURE) ×4 IMPLANT
SUT VICRYL 2 TP 1 (SUTURE) ×8 IMPLANT
SUTURE ETHLN 4-0 FS2 18XMF BLK (SUTURE) IMPLANT
SYR BULB IRRIG 60ML STRL (SYRINGE) ×2 IMPLANT
SYRINGE IRR TOOMEY STRL 70CC (SYRINGE) ×2 IMPLANT
TAPE ADH 3 LX (MISCELLANEOUS) ×2 IMPLANT
TAPE TRANSPORE STRL 2 31045 (GAUZE/BANDAGES/DRESSINGS) ×2 IMPLANT
TRAY FOLEY W/METER SILVER 16FR (SET/KITS/TRAYS/PACK) ×2 IMPLANT
TROCAR FLEXIPATH 20X80 (ENDOMECHANICALS) ×2 IMPLANT
TROCAR FLEXIPATH THORACIC 15MM (ENDOMECHANICALS) IMPLANT
WATER STERILE IRR 1000ML POUR (IV SOLUTION) ×2 IMPLANT
YANKAUER SUCT BULB TIP FLEX NO (MISCELLANEOUS) ×2 IMPLANT

## 2017-11-23 NOTE — Op Note (Signed)
  11/23/2017  2:48 PM  PATIENT:  Thomas Trujillo  72 y.o. male  PRE-OPERATIVE DIAGNOSIS: Recurrent pneumothorax with persistent air leak  POST-OPERATIVE DIAGNOSIS: Recurrent pneumothorax with persistent air leak  PROCEDURE: Preoperative bronchoscopy to assess endobronchial anatomy with left thoracoscopy and talc pleurodesis  SURGEON:  Surgeon(s) and Role:    Nestor Lewandowsky, MD - Primary  ASSISTANTS: Ferrel Logan PAS  ANESTHESIA: General  INDICATIONS FOR PROCEDURE recurrent pneumothorax.  This is a 72 year old gentleman who has had 2 spontaneous pneumothoraces in the last year.  He is status post left upper lobe bullectomy at the time of CABG.  He was apprised of the indications and risks of thoracoscopic talc insufflation and he gave his informed consent.  DICTATION: Patient was brought to the operating suite and placed in the supine position.  General endotracheal anesthesia was given with a double-lumen tube.  Preoperative bronchoscopy was carried out.  There is no evidence of gross endobronchial tumor.  There are no evidence of secretions.  The patient was then turned for a left thoracoscopy.  The left lung was deflated and the left chest tube was removed.  Patient was then prepped and draped in usual sterile fashion.  A single 20 mm thoracic port was created in approximately the sixth intercostal space in the anterior axillary line.  Incision was deepened down through the muscles of the chest wall until the pleural space was entered.  Upon entering the pleural space we saw that there were dense adhesions to the mediastinum.  These were left alone.  There were some thin adhesions to the apex of the lung and these were taken down with a long curved peanut clamp.  Thoracoscopy was carried out.  There is no evidence of intrapleural malignancy or infection.  4 g of sterile talc were then insufflated under direct visualization coating the entire pleural space.  A single 28 French chest tube was  brought out through a separate stab wound inferiorly and positioned to the apex of the chest.  The chest tube was secured to the skin with silk.  The lung was then reinflated and the wounds were closed with 0 Vicryl and the muscles of the chest wall.  3-0 Vicryl on the subcutaneous tissues and 4-0 nylon for the skin.  Patient was then rolled in the supine position where he extubated and taken to the recovery room in stable condition.   Nestor Lewandowsky, MD

## 2017-11-23 NOTE — Anesthesia Post-op Follow-up Note (Signed)
Anesthesia QCDR form completed.        

## 2017-11-23 NOTE — Interval H&P Note (Signed)
History and Physical Interval Note:  11/23/2017 11:46 AM  Thomas Trujillo  has presented today for surgery, with the diagnosis of n/a  The various methods of treatment have been discussed with the patient and family. After consideration of risks, benefits and other options for treatment, the patient has consented to  Procedure(s): VIDEO ASSISTED THORACOSCOPY (VATS) W/TALC PLEUADESIS (N/A) as a surgical intervention .  The patient's history has been reviewed, patient examined, no change in status, stable for surgery.  I have reviewed the patient's chart and labs.  Questions were answered to the patient's satisfaction.     Nestor Lewandowsky

## 2017-11-23 NOTE — Anesthesia Preprocedure Evaluation (Signed)
Anesthesia Evaluation  Patient identified by MRN, date of birth, ID band Patient awake    Reviewed: Allergy & Precautions, H&P , NPO status , Patient's Chart, lab work & pertinent test results, reviewed documented beta blocker date and time   Airway Mallampati: II  TM Distance: >3 FB Neck ROM: full    Dental  (+) Edentulous Upper, Edentulous Lower   Pulmonary neg pulmonary ROS, COPD,  COPD inhaler, former smoker,    Pulmonary exam normal        Cardiovascular Exercise Tolerance: Poor + CAD and + Past MI  negative cardio ROS Normal cardiovascular exam Rhythm:regular Rate:Normal     Neuro/Psych negative neurological ROS  negative psych ROS   GI/Hepatic negative GI ROS, Neg liver ROS,   Endo/Other  negative endocrine ROS  Renal/GU negative Renal ROS  negative genitourinary   Musculoskeletal   Abdominal   Peds  Hematology negative hematology ROS (+)   Anesthesia Other Findings Past Medical History: No date: Bleb, lung (HCC) No date: COPD (chronic obstructive pulmonary disease) ([place]) 02/14/2017: Coronary artery disease     Comment:  NSTEMI with urgent CABG (LIMA-LAD and SVG->ramus) No date: Hyperlipidemia No date: Ischemic cardiomyopathy No date: Patient denies medical problems 05/2017: Pneumothorax     Comment:  Left Past Surgical History: No date: ABDOMINAL HYSTERECTOMY No date: CATARACT EXTRACTION, BILATERAL 02/14/2017: CORONARY ARTERY BYPASS GRAFT; N/A     Comment:  Procedure: CORONARY ARTERY BYPASS GRAFTING (CABG) x 2 ,               using left internal mammary artery and right leg greater               saphenous vein harvested endoscopically LIMA-LAD,               SVG-RAMUS;  Surgeon: [name], MD;  Location: [facility]              OR;  Service: Open Heart Surgery;  Laterality: N/A; 02/14/2017: ENDOVEIN HARVEST OF GREATER SAPHENOUS VEIN; Right     Comment:  Procedure: ENDOVEIN HARVEST OF RIGHT THIGH  GREATER               SAPHENOUS VEIN;  Surgeon: [name], MD;                Location: [place];  Service: Open Heart Surgery;                Laterality: Right; 02/14/2017: LEFT HEART CATH AND CORONARY ANGIOGRAPHY; N/A     Comment:  Procedure: Left Heart Cath and Coronary Angiography;                Surgeon: [name], MD;  Location: [place] [facility]              LAB;  Service: Cardiovascular;  Laterality: N/A; No date: NASAL POLYP SURGERY 02/14/2017: STAPLING OF BLEBS; N/A     Comment:  Procedure: STAPLING OF LARGE LEFT UPPER LOBE PULMONARY               BLEB;  Surgeon: [name], MD;  Location: [place];               Service: Open Heart Surgery;  Laterality: N/A; 02/14/2017: TEE WITHOUT CARDIOVERSION; N/A     Comment:  Procedure: TRANSESOPHAGEAL ECHOCARDIOGRAM (TEE);                Surgeon: [name], MD;  Location: [place];  Service: Open Heart Surgery;  Laterality: N/A; BMI    Body Mass Index:  26.09 kg/m     Reproductive/Obstetrics negative OB ROS                             Anesthesia Physical Anesthesia Plan  ASA: III  Anesthesia Plan: General ETT   Post-op Pain Management:    Induction:   PONV Risk Score and Plan: 3  Airway Management Planned:   Additional Equipment:   Intra-op Plan:   Post-operative Plan:   Informed Consent: I have reviewed the patients History and Physical, chart, labs and discussed the procedure including the risks, benefits and alternatives for the proposed anesthesia with the patient or authorized representative who has indicated his/her understanding and acceptance.   Dental Advisory Given  Plan Discussed with: CRNA  Anesthesia Plan Comments:         Anesthesia Quick Evaluation

## 2017-11-23 NOTE — Anesthesia Procedure Notes (Signed)
Procedure Name: Intubation Performed by: Philbert Riser, CRNA Pre-anesthesia Checklist: Patient identified, Emergency Drugs available, Suction available, Patient being monitored and Timeout performed Patient Re-evaluated:Patient Re-evaluated prior to induction Oxygen Delivery Method: Circle system utilized and Simple face mask Preoxygenation: Pre-oxygenation with 100% oxygen Induction Type: IV induction Ventilation: Mask ventilation without difficulty Laryngoscope Size: Miller and 3 Grade View: Grade II Tube type: Oral Endobronchial tube: Left and EBT position confirmed by fiberoptic bronchoscope and 37 Fr Number of attempts: 2 Airway Equipment and Method: Patient positioned with wedge pillow Placement Confirmation: ETT inserted through vocal cords under direct vision and positive ETCO2 Secured at: 31 cm Tube secured with: Tape Dental Injury: Teeth and Oropharynx as per pre-operative assessment

## 2017-11-23 NOTE — Progress Notes (Signed)
Per Dr. Genevive Bi okay to give meds with a small sip of water prior to surgery.

## 2017-11-24 ENCOUNTER — Encounter: Payer: Self-pay | Admitting: Cardiothoracic Surgery

## 2017-11-24 MED ORDER — ALBUTEROL SULFATE (2.5 MG/3ML) 0.083% IN NEBU
2.5000 mg | INHALATION_SOLUTION | RESPIRATORY_TRACT | Status: DC
  Administered 2017-11-24 – 2017-11-27 (×19): 2.5 mg via RESPIRATORY_TRACT
  Filled 2017-11-24 (×20): qty 3

## 2017-11-24 NOTE — Anesthesia Postprocedure Evaluation (Signed)
Anesthesia Post Note  Patient: Thomas Trujillo  Procedure(s) Performed: VIDEO ASSISTED THORACOSCOPY (VATS) W/TALC PLEUADESIS (N/A )  Patient location during evaluation: PACU Anesthesia Type: General Level of consciousness: awake and alert Pain management: pain level controlled Vital Signs Assessment: post-procedure vital signs reviewed and stable Respiratory status: spontaneous breathing, nonlabored ventilation, respiratory function stable and patient connected to nasal cannula oxygen Cardiovascular status: blood pressure returned to baseline and stable Postop Assessment: no apparent nausea or vomiting Anesthetic complications: no     Last Vitals:  Vitals:   11/24/17 0854 11/24/17 1415  BP:  116/61  Pulse: 87 88  Resp:  16  Temp:  (!) 36.3 C  SpO2: 90% 92%    Last Pain:  Vitals:   11/24/17 1415  TempSrc: Axillary  PainSc:                  Molli Barrows

## 2017-11-24 NOTE — Anesthesia Post-op Follow-up Note (Signed)
Anesthesia QCDR form completed.        

## 2017-11-24 NOTE — Care Management (Signed)
Nursing reports that patient has home O2, and wears it PRN. RNCM confirmed with Brad at [facility] that patient's order is for continuous, and has portable O2 in the home.

## 2017-11-24 NOTE — Progress Notes (Signed)
Feels good this morning though some discomfort with deep cough  No air leak seen  Lungs very distant bilaterally  Will leave on suction for 48 hours and consider water seal on Sunday  [employer].

## 2017-11-24 NOTE — Transfer of Care (Signed)
Immediate Anesthesia Transfer of Care Note  Patient: Thomas Trujillo  Procedure(s) Performed: VIDEO ASSISTED THORACOSCOPY (VATS) W/TALC PLEUADESIS (N/A )  Patient Location: PACU  Anesthesia Type:General  Level of Consciousness: sedated  Airway & Oxygen Therapy: Patient Spontanous Breathing and Patient connected to face mask oxygen  Post-op Assessment: Report given to RN and Post -op Vital signs reviewed and stable  Post vital signs: Reviewed and stable  Last Vitals:  Vitals:   11/24/17 0455 11/24/17 0724  BP: (!) 108/57   Pulse: 78   Resp: 16   Temp: 36.6 C   SpO2: 96% 96%    Last Pain:  Vitals:   11/24/17 0626  TempSrc:   PainSc: Asleep      Patients Stated Pain Goal: 2 (09/09/34 6701)  Complications: No apparent anesthesia complications

## 2017-11-24 NOTE — Care Management Important Message (Signed)
Important Message  Patient Details  Name: Thomas Trujillo MRN: [id number] Date of Birth: May 03, 1946   Medicare Important Message Given:  Yes    Beverly Sessions, RN 11/24/2017, 3:31 PM

## 2017-11-25 ENCOUNTER — Inpatient Hospital Stay: Payer: Medicare Other

## 2017-11-25 NOTE — Plan of Care (Signed)
Pt with CT in place to suction. No air leak, minimal output. Pain controlled by scheduled medications and prn oxycodone. Dressing changed once.

## 2017-11-25 NOTE — Progress Notes (Signed)
No problems overnight.  Minimal pain.  Not short of breath.  No air leak  CXRay from today looks good.  No pneumothorax.  Small left pleural effusion  Wounds clean and dry.  Changed all dressings  Will check CXRay in the morning.  If OK, place to water seal and repeat CXRay later in the day.  If OK, consider removing the chest tube.  [employer].

## 2017-11-26 ENCOUNTER — Inpatient Hospital Stay: Payer: Medicare Other

## 2017-11-26 LAB — BASIC METABOLIC PANEL
Anion gap: 8 (ref 5–15)
BUN: 27 mg/dL — AB (ref 6–20)
CALCIUM: 8.9 mg/dL (ref 8.9–10.3)
CO2: 30 mmol/L (ref 22–32)
CREATININE: 1.06 mg/dL (ref 0.61–1.24)
Chloride: 98 mmol/L — ABNORMAL LOW (ref 101–111)
GFR calc Af Amer: >60 mL/min (ref >60)
GFR calc non Af Amer: >60 mL/min (ref >60)
GLUCOSE: 90 mg/dL (ref 65–99)
Potassium: 4.1 mmol/L (ref 3.5–5.1)
Sodium: 136 mmol/L (ref 135–145)

## 2017-11-26 LAB — CBC
HCT: 39.8 % — ABNORMAL LOW (ref 40.0–52.0)
HEMOGLOBIN: 13.4 g/dL (ref 13.0–18.0)
MCH: 31.5 pg (ref 26.0–34.0)
MCHC: 33.7 g/dL (ref 32.0–36.0)
MCV: 93.3 fL (ref 80.0–100.0)
PLATELETS: 174 10*3/uL (ref 150–440)
RBC: 4.26 MIL/uL — ABNORMAL LOW (ref 4.40–5.90)
RDW: 15.1 % — AB (ref 11.5–14.5)
WBC: 7.9 10*3/uL (ref 3.8–10.6)

## 2017-11-26 NOTE — Progress Notes (Signed)
Primary nurse called and spoke to Dr. Genevive Bi in regard to pt being took off suction to go to xray this AM. Orders received for pt to be taken off suction to go to xray. Primary nurse to continue to monitor.

## 2017-11-26 NOTE — Progress Notes (Signed)
Pt.'s chest tube has been on water seal since this a.m. Pt has had no complaints of shortness of breath all day and pt.'s O2 stats has been 98% on 2L.  Thomas Trujillo [employer]

## 2017-11-26 NOTE — Progress Notes (Signed)
3 Days Post-Op  Subjective: Patient is status post VATS procedure on the left.  He has no complaints.  No shortness of breath.  Objective: Vital signs in last 24 hours: Temp:  [97.7 F (36.5 C)-98.5 F (36.9 C)] 98 F (36.7 C) (02/03 0424) Pulse Rate:  [89-96] 89 (02/03 0424) Resp:  [17-18] 18 (02/03 0424) BP: (113-132)/(55-59) 132/57 (02/03 0424) SpO2:  [94 %-97 %] 96 % (02/03 0438) Last BM Date: 11/22/17  Intake/Output from previous day: 02/02 0701 - 02/03 0700 In: 600 [P.O.:600] Out: 1715 [Urine:1675; Chest Tube:40] Intake/Output this shift: No intake/output data recorded.  Physical exam:  Dressings intact no air leak nontender calves  Lab Results: CBC  Recent Labs    11/23/17 1617 11/26/17 0720  WBC 8.8 7.9  HGB 14.5 13.4  HCT 43.1 39.8*  PLT 165 174   BMET Recent Labs    11/23/17 1503 11/26/17 0720  NA 138 136  K 4.3 4.1  CL 101 98*  CO2 28 30  GLUCOSE 125* 90  BUN 34* 27*  CREATININE 1.15 1.06  CALCIUM 8.8* 8.9   PT/INR No results for input(s): LABPROT, INR in the last 72 hours. ABG No results for input(s): PHART, HCO3 in the last 72 hours.  Invalid input(s): PCO2, PO2  Studies/Results: Dg Chest Port 1 View  Result Date: 11/25/2017 CLINICAL DATA:  Right-sided chest tube for 1 week EXAM: PORTABLE CHEST 1 VIEW COMPARISON:  11/23/2017 FINDINGS: Left-sided chest tube directed towards the apex unchanged from the prior exam. No left pneumothorax. Small left pleural effusion and left basilar airspace disease likely reflecting atelectasis. Lungs are hyperinflated consistent with COPD. Chronic interstitial thickening of the right mid lung and bilateral lung bases. No right pneumothorax. No right pleural effusion. Stable cardiomediastinal silhouette. Prior CABG. No acute osseous abnormality. IMPRESSION: 1. Left-sided chest tube directed towards the apex unchanged from the prior exam. No left pneumothorax. 2. Small left pleural effusion and left basilar  airspace disease likely reflecting atelectasis. 3. COPD. Electronically Signed   By: Kathreen Devoid   On: 11/25/2017 09:21    Anti-infectives: Anti-infectives (From admission, onward)   Start     Dose/Rate Route Frequency Ordered Stop   11/24/17 0100  cefUROXime (ZINACEF) 1.5 g in dextrose 5 % 50 mL IVPB     1.5 g 100 mL/hr over 30 Minutes Intravenous Every 12 hours 11/23/17 1610 11/24/17 1528   11/21/17 0847  cefUROXime (ZINACEF) 1.5 g in dextrose 5 % 50 mL IVPB     1.5 g 100 mL/hr over 30 Minutes Intravenous 60 min pre-op 11/21/17 0847 11/23/17 1302      Assessment/Plan: s/p Procedure(s): VIDEO ASSISTED THORACOSCOPY (VATS) W/TALC PLEUADESIS   Chest x-ray personally reviewed without sign of pneumothorax.  The official reading is not complete yet.  Have placed the patient to waterseal and will review official report.  Florene Glen, MD, FACS  11/26/2017

## 2017-11-27 ENCOUNTER — Inpatient Hospital Stay: Payer: Medicare Other

## 2017-11-27 MED ORDER — FLEET ENEMA 7-19 GM/118ML RE ENEM
1.0000 | ENEMA | Freq: Once | RECTAL | Status: AC
  Administered 2017-11-27: 1 via RECTAL

## 2017-11-27 NOTE — Discharge Instructions (Signed)
Thoracotomy Thoracotomy is surgery to open the chest to get access to the organs and tissue inside. This type of surgery is often used to repair or treat the lungs, heart, or arteries, or to remove tissue. Tell a health care provider about:  Any allergies you have.  All medicines you are taking, including vitamins, herbs, eye drops, creams, and over-the-counter medicines.  Any problems you or family members have had with anesthetic medicines.  Any blood disorders you have.  Any surgeries you have had.  Any medical conditions you have.  Whether you are pregnant or may be pregnant. What are the risks? Generally, this is a safe procedure. However, problems may occur, including:  Infection.  Severe bleeding (hemorrhage).  Allergic reaction to medicines.  Damage to other structures or organs, such as nerves.  Abnormal heart rhythm (arrhythmia).  Respiratory failure. This is when not enough oxygen passes from the lungs into the blood. In the case of this procedure, this is most often caused by lung collapse.  What happens before the procedure? Medicines  Ask your health care provider about: ? Changing or stopping your regular medicines. This is especially important if you are taking diabetes medicines or blood thinners. ? Taking medicines such as aspirin and ibuprofen. These medicines can thin your blood. Do not take these medicines before your procedure if your health care provider instructs you not to.  You may be given antibiotic medicine to help prevent infection. Staying hydrated Follow instructions from your health care provider about hydration, which may include:  Up to 2 hours before the procedure - you may continue to drink clear liquids, such as water, clear fruit juice, black coffee, and plain tea.  Eating and drinking restrictions Follow instructions from your health care provider about eating and drinking, which may include:  8 hours before the procedure - stop  eating heavy meals or foods such as meat, fried foods, or fatty foods.  6 hours before the procedure - stop eating light meals or foods, such as toast or cereal.  6 hours before the procedure - stop drinking milk or drinks that contain milk.  2 hours before the procedure - stop drinking clear liquids.  General instructions  You may have tests, such as: ? X-rays. ? MRI. ? CT scan. ? Tests of mucus from your lungs (sputum culture). ? Blood tests. ? Lung (pulmonary) function tests. ? A test of heart function and rhythm (electrocardiogram, ECG). ? A test to evaluate the blood vessels in your lungs (pulmonary angiogram).  You may be asked to shower with a germ-killing soap.  Ask your health care provider how your surgical site will be marked or identified.  Plan to have someone take you home from the hospital.  Do not use any products that contain nicotine or tobacco, such as cigarettes and e-cigarettes. If you need help quitting, ask your health care provider. What happens during the procedure?  To lower your risk of infection: ? Your health care team will wash or sanitize their hands. ? Your skin will be washed with soap.  An IV tube will be inserted into one of your veins.  You will be given a medicine to make you fall asleep (general anesthetic). You may also be given a medicine to help you relax (sedative).  A thin tube (catheter) will be inserted into your urethra and bladder to drain your urine.  A tube will be placed down your throat to help you breathe.  A 5-10 inch (13-25 cm)  incision will be made in your chest. The size and exact location of the incision varies depending on the purpose of the procedure.  A tool (retractor) will be used to separate muscle, tissue, and in some cases, your rib cage. This is done to create easier access to organs and tissue.  Any damaged or diseased tissue inside of your chest will be removed.  A chest tube will be inserted between  your ribs to drain fluid. This helps to prevent fluid buildup in your lungs.  Your incision will be closed with stitches (sutures) or staples.  A bandage (dressing) may be placed over the incision. The procedure may vary among health care providers and hospitals. What happens after the procedure?  Your blood pressure, heart rate, breathing rate, and blood oxygen level will be monitored.  You will be given pain medicine as needed.  You will continue to have a chest tube draining fluid from your lungs for 24-48 hours. You will be monitored closely for signs of fluid buildup in your lungs.  You may continue: ? To have a breathing tube. ? To receive fluids and medicines through an IV tube. ? To have a catheter draining your urine.  You may have to wear compression stockings. These stockings help to prevent blood clots and reduce swelling in your legs.  You may be shown how to do breathing exercises and how to use a tool that measures how well you are filling your lungs with each breath (incentive spirometer). These can help prevent pneumonia. Summary  Thoracotomy is surgery to open the chest to get access to the organs and tissue inside.  This type of surgery is often used to repair or treat the lungs, heart, or arteries, or to remove tissue.  During the procedure, a 5-10 inch (13-25 cm) incision will be made in your chest. The size and exact location   Pneumothorax A pneumothorax, commonly called a collapsed lung, is a condition in which air leaks from a lung and builds up in the space between the lung and the chest wall (pleural space). The air in a pneumothorax is trapped outside the lung and takes up space, preventing the lung from fully expanding. This is a condition that usually occurs suddenly. The buildup of air may be small or large. A small pneumothorax may go away on its own. When a pneumothorax is larger, it will often require medical treatment and hospitalization. What are  the causes? A pneumothorax can sometimes happen quickly with no apparent cause. People with underlying lung problems, particularly COPD or emphysema, are at higher risk of pneumothorax. However, pneumothorax can happen quickly even in people with no prior known lung problems. Trauma, surgery, medical procedures, or injury to the chest wall can also cause a pneumothorax. What are the signs or symptoms? Sometimes a pneumothorax will have no symptoms. When symptoms are present, they can include: Chest pain. Shortness of breath. Increased rate of breathing. Bluish color to your lips or skin (cyanosis).  How is this diagnosed? Pneumothorax is usually diagnosed by a chest X-ray or chest CT scan. Your health care provider will also take a medical history and perform a physical exam to determine why you may have a pneumothorax. How is this treated? A small pneumothorax may go away on its own without treatment. Extra oxygen can sometimes help a small pneumothorax go away more quickly. For a larger pneumothorax or a pneumothorax that is causing symptoms, a procedure is usually needed to drain the air.In some  cases, the health care provider may drain the air using a needle. In other cases, a chest tube may be inserted into the pleural space. A chest tube is a small tube placed between the ribs and into the pleural space. This removes the extra air and allows the lung to expand back to its normal size. A large pneumothorax will usually require a hospital stay. If there is ongoing air leakage into the pleural space, then the chest tube may need to remain in place for several days until the air leak has healed. In some cases, surgery may be needed. Follow these instructions at home: Only take over-the-counter or prescription medicines as directed by your health care provider. If a cough or pain makes it difficult for you to sleep at night, try sleeping in a semi-upright position in a recliner or by using 2 or 3  pillows. Rest and limit activity as directed by your health care provider. If you had a chest tube and it was removed, ask your health care provider when it is okay to remove the dressing. Until your health care provider says you can remove the dressing, do not allow it to get wet. Do not smoke. Smoking is a risk factor for pneumothorax. Do not fly in an airplane or scuba dive until your health care provider says it is okay. Follow up with your health care provider as directed. Get help right away if: You have increasing chest pain or shortness of breath. You have a cough that is not controlled with suppressants. You begin coughing up blood. You have pain that is getting worse or is not controlled with medicines. You cough up thick, discolored mucus (sputum) that is yellow to green in color. You have redness, increasing pain, or discharge at the site where a chest tube had been in place (if your pneumothorax was treated with a chest tube). The site where your chest tube was located opens up. You feel air coming out of the site where the chest tube was placed. You have a fever or persistent symptoms for more than 2-3 days. You have a fever and your symptoms suddenly get worse. This information is not intended to replace advice given to you by your health care provider. Make sure you discuss any questions you have with your health care provider. Document Released: 10/10/2005 Document Revised: 03/17/2016 Document Reviewed: 03/05/2014 Elsevier Interactive Patient Education  [employer].  of the incision varies depending on the purpose of the procedure.  After this procedure, you will continue to have a chest tube draining fluid from your lungs for 24-48 hours. You will be monitored closely for signs of fluid buildup in your lungs. This information is not intended to replace advice given to you by your health care provider. Make sure you discuss any questions you have with your health care  provider. Document Released: 07/09/2003 Document Revised: 07/04/2016 Document Reviewed: 07/04/2016 Elsevier Interactive Patient Education  2017 [employer].

## 2017-11-27 NOTE — Discharge Summary (Signed)
Physician Discharge Summary  Patient ID: Thomas Trujillo MRN: [id number] DOB/AGE: September 12, 1946 72 y.o.  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  Did well postop with resolution of his air leak and his pneumothorax.  Chest tube removed without incident and patient discharged to home.  Will see in 5 days as outpatient.  Disposition: 01-Home or Self Care  Discharge Instructions    Diet - low sodium heart healthy   Complete by:  As directed    Discharge wound care:   Complete by:  As directed    Remove dressing in 48 hours and wash over wound with soap and water.  Make appointment with me on Friday Feb 8th.   Increase activity slowly   Complete by:  As directed      Allergies as of 11/27/2017      Reactions   No Known Allergies       Medication List    TAKE these medications   aspirin EC 81 MG tablet Take 1 tablet (81 mg total) by mouth daily.   atorvastatin 40 MG tablet Commonly known as:  LIPITOR Take 40 mg by mouth daily.   budesonide 0.5 MG/2ML nebulizer solution Commonly known as:  PULMICORT Take 0.5 mg by nebulization daily.   ferrous sulfate 325 (65 FE) MG tablet Take 325 mg by mouth daily with breakfast.   furosemide 20 MG tablet Commonly known as:  LASIX TAKE 1-2 TABS AS DIRECTED ONCE DAILY WITH ADDITIONAL DOSE IF GAINS 2 POUNDS IN A DAY OR 5 IN A WEEK   ipratropium-albuterol 0.5-2.5 (3) MG/3ML Soln Commonly known as:  DUONEB Take 3 mLs by nebulization every 6 (six) hours as needed. When awake   MULTIVITAMIN ADULT PO Take 1 tablet by mouth daily.   OXYGEN Inhale into the lungs. By nasal cannula and wean as tolerated to keep O2 sats >90%   potassium chloride 10 MEQ tablet Commonly known as:  K-DUR Take 10 mEq by mouth daily.    tamsulosin 0.4 MG Caps capsule Commonly known as:  FLOMAX Take 1 capsule (0.4 mg total) by mouth daily.            Discharge Care Instructions  (From admission, onward)        Start     Ordered   11/27/17 0000  Discharge wound care:    Comments:  Remove dressing in 48 hours and wash over wound with soap and water.  Make appointment with me on Friday Feb 8th.   11/27/17 [place], MD

## 2017-11-27 NOTE — Care Management Important Message (Signed)
Important Message  Patient Details  Name: Thomas Trujillo MRN: [id number] Date of Birth: [birth year]   Medicare Important Message Given:  Yes    Beverly Sessions, RN 11/27/2017, 3:21 PM

## 2017-11-27 NOTE — Progress Notes (Signed)
Did well overnight.  Not short of breath.    Minimal drainage from chest tube  CXRay looks good on water seal.  No air leak and no pneumothorax.  Chest tube removed.  Will see in the office later this week.  [employer].

## 2017-11-27 NOTE — Care Management (Signed)
Patient was admitted with spontaneous pneumothorax.  Chest tube has been removed, and patient to discharge today.  Will follow up with Dr. Genevive Bi in 1 week.  No needs for skilled nursing identified at discharge.  RN to ambulate patient prior to discharge.  Family to bring portable tank for transportation. RNCM signing off.  Please consult if indicated.

## 2017-11-30 ENCOUNTER — Telehealth: Payer: Self-pay

## 2017-11-30 ENCOUNTER — Other Ambulatory Visit: Payer: Self-pay

## 2017-11-30 DIAGNOSIS — J9312 Secondary spontaneous pneumothorax: Secondary | ICD-10-CM

## 2017-11-30 NOTE — Telephone Encounter (Signed)
Call made to patient at this time and I advised him that Dr. Genevive Bi would like for him to have a chest x-ray prior to his appointment tomorrow. I verbalized to him that he would need to check in at the [facility] and check in at the registration desk and then to come over to our office after having the x-ray. He verbalized understanding.

## 2017-12-01 ENCOUNTER — Ambulatory Visit
Admission: RE | Admit: 2017-12-01 | Discharge: 2017-12-01 | Disposition: A | Discharge status: Home / Self Care | Payer: Medicare Other | Source: Ambulatory Visit | Attending: Cardiothoracic Surgery | Admitting: Cardiothoracic Surgery

## 2017-12-01 ENCOUNTER — Encounter: Payer: Self-pay | Admitting: Cardiothoracic Surgery

## 2017-12-01 ENCOUNTER — Ambulatory Visit (INDEPENDENT_AMBULATORY_CARE_PROVIDER_SITE_OTHER): Payer: Medicare Other | Admitting: Cardiothoracic Surgery

## 2017-12-01 VITALS — BP 136/75 | HR 112 | Temp 99.0°F | Resp 22 | Ht 64.0 in | Wt 152.8 lb

## 2017-12-01 DIAGNOSIS — J439 Emphysema, unspecified: Secondary | ICD-10-CM | POA: Diagnosis not present

## 2017-12-01 DIAGNOSIS — J9312 Secondary spontaneous pneumothorax: Secondary | ICD-10-CM

## 2017-12-01 DIAGNOSIS — J9383 Other pneumothorax: Secondary | ICD-10-CM

## 2017-12-01 NOTE — Patient Instructions (Signed)
Please call our office if you have any questions or concerns.  Dr.Flemming can see you 12/05/17 @ 10:45 am.

## 2017-12-01 NOTE — Progress Notes (Signed)
He returns today in follow-up.  His only complaint is that he has had lower oxygen saturations than usual.  He states that when he walks around the house his saturations are in the 90s with oxygen but occasionally will drop down into the high 80s without it.  In the past he has been able to go without oxygen for most of the day but he finds that he must wear it more often than he did prior to his thoracoscopy.  Today his oxygen saturations are in the mid 90s without oxygen.  He did have a chest x-ray made today which looks good.  There is some blunting at the left costophrenic angle but no pneumothorax.  All of his wounds are clean dry and intact.  We removed the sutures.  His lungs are very distant bilaterally and his heart is regular.  We will make an appointment for him to follow-up with Dr. Raul Del.  He will come back to see [place] on an as-needed basis.

## 2017-12-06 ENCOUNTER — Ambulatory Visit: Payer: Medicare Other | Admitting: Internal Medicine

## 2017-12-18 ENCOUNTER — Telehealth: Payer: Self-pay

## 2017-12-18 NOTE — Telephone Encounter (Signed)
Dr. Saunders Revel had stopped the Metoprolol at the patients last office visit.  Please review for refill. Thanks!

## 2017-12-18 NOTE — Telephone Encounter (Signed)
No answer to patient. Left message to call back to inquire if he is still taking metoprolol.

## 2017-12-19 NOTE — Telephone Encounter (Signed)
No answer. Left message to call back.   

## 2017-12-19 NOTE — Telephone Encounter (Signed)
S/w patient. He did stop metoprolol back at last office visit as advised by Dr End. We think this refill request was an automatic refill request. I discontinued metoprolol from patient's list. He states his blood pressure are still running 742'V systolic. No other issues or questions at this time.

## 2017-12-20 ENCOUNTER — Other Ambulatory Visit: Payer: Self-pay | Admitting: Internal Medicine

## 2018-02-20 ENCOUNTER — Other Ambulatory Visit: Payer: Self-pay

## 2018-02-20 MED ORDER — FUROSEMIDE 20 MG PO TABS: ORAL_TABLET | ORAL | 3 refills | Status: DC

## 2018-06-12 ENCOUNTER — Other Ambulatory Visit: Payer: Self-pay | Admitting: Internal Medicine

## 2018-06-17 ENCOUNTER — Emergency Department
Admission: EM | Admit: 2018-06-17 | Discharge: 2018-06-17 | Disposition: A | Discharge status: Home / Self Care | Payer: Medicare Other | Attending: Emergency Medicine | Admitting: Emergency Medicine

## 2018-06-17 DIAGNOSIS — Z951 Presence of aortocoronary bypass graft: Secondary | ICD-10-CM | POA: Diagnosis not present

## 2018-06-17 DIAGNOSIS — Z87891 Personal history of nicotine dependence: Secondary | ICD-10-CM | POA: Diagnosis not present

## 2018-06-17 DIAGNOSIS — I2510 Atherosclerotic heart disease of native coronary artery without angina pectoris: Secondary | ICD-10-CM | POA: Diagnosis not present

## 2018-06-17 DIAGNOSIS — Z79899 Other long term (current) drug therapy: Secondary | ICD-10-CM | POA: Diagnosis not present

## 2018-06-17 DIAGNOSIS — R040 Epistaxis: Secondary | ICD-10-CM | POA: Diagnosis not present

## 2018-06-17 DIAGNOSIS — J449 Chronic obstructive pulmonary disease, unspecified: Secondary | ICD-10-CM | POA: Insufficient documentation

## 2018-06-17 DIAGNOSIS — Z7982 Long term (current) use of aspirin: Secondary | ICD-10-CM | POA: Diagnosis not present

## 2018-06-17 DIAGNOSIS — Z7901 Long term (current) use of anticoagulants: Secondary | ICD-10-CM | POA: Insufficient documentation

## 2018-06-17 LAB — TYPE AND SCREEN
ABO/RH(D): B POS
Antibody Screen: NEGATIVE

## 2018-06-17 LAB — CBC
HCT: 42.1 % (ref 40.0–52.0)
HEMOGLOBIN: 14.6 g/dL (ref 13.0–18.0)
MCH: 31.8 pg (ref 26.0–34.0)
MCHC: 34.7 g/dL (ref 32.0–36.0)
MCV: 91.8 fL (ref 80.0–100.0)
PLATELETS: 204 10*3/uL (ref 150–440)
RBC: 4.59 MIL/uL (ref 4.40–5.90)
RDW: 14.3 % (ref 11.5–14.5)
WBC: 6.4 10*3/uL (ref 3.8–10.6)

## 2018-06-17 MED ORDER — IPRATROPIUM-ALBUTEROL 0.5-2.5 (3) MG/3ML IN SOLN
3.0000 mL | Freq: Once | RESPIRATORY_TRACT | Status: AC
  Administered 2018-06-17: 3 mL via RESPIRATORY_TRACT
  Filled 2018-06-17: qty 3

## 2018-06-17 MED ORDER — PHENYLEPHRINE HCL 0.5 % NA SOLN
NASAL | Status: AC
  Administered 2018-06-17: 04:00:00
  Filled 2018-06-17: qty 15

## 2018-06-17 MED ORDER — CEPHALEXIN 500 MG PO CAPS: 500.0000 mg | ORAL_CAPSULE | Freq: Four times a day (QID) | ORAL | 0 refills | Status: AC

## 2018-06-17 MED ORDER — CEPHALEXIN 500 MG PO CAPS: 500.0000 mg | ORAL_CAPSULE | Freq: Four times a day (QID) | ORAL | 0 refills | Status: DC

## 2018-06-17 MED ORDER — OXYMETAZOLINE HCL 0.05 % NA SOLN
NASAL | Status: AC
  Filled 2018-06-17: qty 15

## 2018-06-17 MED ORDER — ACETAMINOPHEN 325 MG PO TABS
650.0000 mg | ORAL_TABLET | Freq: Once | ORAL | Status: AC
  Administered 2018-06-17: 650 mg via ORAL
  Filled 2018-06-17: qty 2

## 2018-06-17 NOTE — ED Triage Notes (Signed)
Pt with epistaxis starting on Friday ,off and on .

## 2018-06-17 NOTE — Discharge Instructions (Addendum)
Please follow up with ENT, no nose blowing, please take antibiotics to prevent toxic shock syndrome.

## 2018-06-17 NOTE — ED Provider Notes (Signed)
[facility] Emergency Department Provider Note   ____________________________________________   First MD Initiated Contact with Patient 06/17/18 [phone]     (approximate)  I have reviewed the triage vital signs and the nursing notes.   HISTORY  Chief Complaint Epistaxis    HPI Thomas Trujillo is a 72 y.o. male who comes into the hospital today for a nosebleed.  The patient states that it started on Friday and has come on and off for the past 2 days.  The patient has been putting paper towel into his right nostril every time it starts but then when he takes it out it starts gushing again.  The patient has a history of COPD and is on 2 L of O2 by nasal cannula.  The patient also takes Eliquis.  He reports that it stopped midday for Saturday and he was able to take out the tissue out of his nose when he blew his nose the bleeding started up again.  He is never had any nosebleeds like this in the past.  He is not having any shortness of breath or any blood going down the back of his throat.   Past Medical History:  Diagnosis Date  . Bleb, lung ([place])   . COPD (chronic obstructive pulmonary disease) ([place])   . Coronary artery disease 02/14/2017   NSTEMI with urgent CABG (LIMA-LAD and SVG->ramus)  . Hyperlipidemia   . Ischemic cardiomyopathy   . Patient denies medical problems   . Pneumothorax 05/2017   Left    Patient Active Problem List   Diagnosis Date Noted  . Pneumothorax on left 11/18/2017  . Dyspnea on exertion 09/14/2017  . Chronic obstructive pulmonary disease ([place]) 06/07/2017  . Pneumothorax 05/30/2017  . Ischemic cardiomyopathy 04/05/2017  . Hyperlipidemia LDL goal <70 04/05/2017  . Coronary artery disease involving native coronary artery of native heart without angina pectoris 02/24/2017  . 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 NSTEMI (non-ST elevated myocardial infarction) ([place]) 02/13/2017    Past Surgical History:  Procedure Laterality Date  .  ABDOMINAL HYSTERECTOMY    . CATARACT EXTRACTION, BILATERAL    . CORONARY ARTERY BYPASS GRAFT N/A 02/14/2017   Procedure: CORONARY ARTERY BYPASS GRAFTING (CABG) x 2 , using left internal mammary artery and right leg greater saphenous vein harvested endoscopically LIMA-LAD, SVG-RAMUS;  Surgeon: [name], MD;  Location: [place];  Service: Open Heart Surgery;  Laterality: N/A;  . ENDOVEIN HARVEST OF GREATER SAPHENOUS VEIN Right 02/14/2017   Procedure: ENDOVEIN HARVEST OF RIGHT THIGH GREATER SAPHENOUS VEIN;  Surgeon: [name], MD;  Location: [place];  Service: Open Heart Surgery;  Laterality: Right;  . LEFT HEART CATH AND CORONARY ANGIOGRAPHY N/A 02/14/2017   Procedure: Left Heart Cath and Coronary Angiography;  Surgeon: [name], MD;  Location: [place] [facility];  Service: Cardiovascular;  Laterality: N/A;  . NASAL POLYP SURGERY    . STAPLING OF BLEBS N/A 02/14/2017   Procedure: STAPLING OF LARGE LEFT UPPER LOBE PULMONARY BLEB;  Surgeon: [name], MD;  Location: [place];  Service: Open Heart Surgery;  Laterality: N/A;  . TEE WITHOUT CARDIOVERSION N/A 02/14/2017   Procedure: TRANSESOPHAGEAL ECHOCARDIOGRAM (TEE);  Surgeon: [name], MD;  Location: [place];  Service: Open Heart Surgery;  Laterality: N/A;  . VIDEO ASSISTED THORACOSCOPY (VATS) W/TALC PLEUADESIS N/A 11/23/2017   Procedure: VIDEO ASSISTED THORACOSCOPY (VATS) W/TALC PLEUADESIS;  Surgeon: Nestor Lewandowsky, MD;  Location: [facility];  Service: Thoracic;  Laterality: N/A;  Prior to Admission medications   Medication Sig Start Date End Date Taking? Authorizing Provider  aspirin EC 81 MG tablet Take 1 tablet (81 mg total) by mouth daily. 11/08/17   End, Harrell Gave, MD  atorvastatin (LIPITOR) 40 MG tablet Take 40 mg by mouth daily.    [provider]  atorvastatin (LIPITOR) 40 MG tablet TAKE 1 TABLET BY MOUTH EVERY DAY 12/20/17   End, Harrell Gave, MD  budesonide (PULMICORT) 0.5 MG/2ML nebulizer solution Take  0.5 mg by nebulization daily.    [provider]  cephALEXin (KEFLEX) 500 MG capsule Take 1 capsule (500 mg total) by mouth 4 (four) times daily for 5 days. 06/17/18 06/22/18  Loney Hering, MD  ferrous sulfate 325 (65 FE) MG tablet Take 325 mg by mouth daily with breakfast.    [provider]  furosemide (LASIX) 20 MG tablet TAKE 1-2 TABS AS DIRECTED ONCE DAILY WITH ADDITIONAL DOSE IF GAINS 2 POUNDS IN A DAY OR 5 IN A WEEK 06/12/18   End, Harrell Gave, MD  ipratropium-albuterol (DUONEB) 0.5-2.5 (3) MG/3ML SOLN Take 3 mLs by nebulization every 6 (six) hours as needed. When awake     [provider]  Multiple Vitamins-Minerals (MULTIVITAMIN ADULT PO) Take 1 tablet by mouth daily.    [provider]  OXYGEN Inhale into the lungs. By nasal cannula and wean as tolerated to keep O2 sats >90%    [provider]  potassium chloride (K-DUR) 10 MEQ tablet Take 10 mEq by mouth daily.    [provider]  tamsulosin (FLOMAX) 0.4 MG CAPS capsule Take 1 capsule (0.4 mg total) by mouth daily. 02/25/17   Elgie Collard, PA-C    Allergies No known allergies  Family History  Problem Relation Age of Onset  . Obesity Son     Social History Social History   Tobacco Use  . Smoking status: Former Smoker    Last attempt to quit: 04/06/2007    Years since quitting: 11.2  . Smokeless tobacco: Never Used  Substance Use Topics  . Alcohol use: No  . Drug use: No    Review of Systems  Constitutional: No fever/chills Eyes: No visual changes. ENT: nose bleed from right nostril Cardiovascular: Denies chest pain. Respiratory: Denies shortness of breath. Gastrointestinal: No abdominal pain, no nausea, no vomiting Genitourinary: Negative for dysuria. Musculoskeletal: Negative for back pain. Skin: Negative for rash. Neurological: Negative for headaches   ____________________________________________   PHYSICAL EXAM:  VITAL SIGNS: ED Triage Vitals    Enc Vitals Group     BP 06/17/18 0405 (!) 149/84     Pulse Rate 06/17/18 0405 (!) 109     Resp 06/17/18 0405 19     Temp 06/17/18 0405 97.9 F (36.6 C)     Temp Source 06/17/18 0405 Oral     SpO2 06/17/18 0405 94 %     Weight 06/17/18 0406 156 lb (70.8 kg)     Height 06/17/18 0406 5\' 4"  (1.626 m)     Head Circumference --      Peak Flow --      Pain Score 06/17/18 0406 0     Pain Loc --      Pain Edu? --      Excl. in [place]? --     Constitutional: Alert and oriented. Well appearing and in moderate distress. Eyes: Conjunctivae are normal. PERRL. EOMI. Head: Atraumatic. Nose: blood in right nostril Mouth/Throat: Mucous membranes are moist.  Oropharynx non-erythematous. Cardiovascular: Normal rate, regular  rhythm. Grossly normal heart sounds.  Good peripheral circulation. Respiratory: Normal respiratory effort.  No retractions. Lungs CTAB. Gastrointestinal: Soft and nontender. No distention. Positive bowel sounds Musculoskeletal: No lower extremity tenderness nor edema.   Neurologic:  Normal speech and language.  Skin:  Skin is warm, dry and intact.  Psychiatric: Mood and affect are normal.  ____________________________________________   LABS (all labs ordered are listed, but only abnormal results are displayed)  Labs Reviewed  CBC  TYPE AND SCREEN   ____________________________________________  EKG  none ____________________________________________  RADIOLOGY  ED MD interpretation:  none  Official radiology report(s): No results found.  ____________________________________________   PROCEDURES  Procedure(s) performed: please, see procedure note(s).  .Epistaxis Management Date/Time: 06/17/2018 4:15 AM Performed by: Loney Hering, MD Authorized by: Loney Hering, MD   Consent:    Consent obtained:  Verbal   Consent given by:  Patient   Risks discussed:  Infection, bleeding and pain Anesthesia (see MAR for exact dosages):    Anesthesia method:   None Procedure details:    Treatment site:  R posterior   Treatment method:  Merocel sponge   Treatment complexity:  Limited   Treatment episode: initial   Post-procedure details:    Assessment:  Bleeding stopped   Patient tolerance of procedure:  Tolerated well, no immediate complications    Critical Care performed: No  ____________________________________________   INITIAL IMPRESSION / ASSESSMENT AND PLAN / ED COURSE  As part of my medical decision making, I reviewed the following data within the electronic MEDICAL RECORD NUMBER Notes from prior ED visits and [place] Controlled Substance Database   This is a 72 year old male who comes into the hospital today with some epistaxis.  The patient states that his bleeding has been on and off for the last 2 days.  When he arrived I did place some merocel in his right nostril.  There was some blood in his nostril and given the fact that he is on Eliquis and the bleeding has been on and off I felt that it was the safest option.  The patient had no further bleeding in the emergency department.  We did check a CBC which was unremarkable.  The patient will be discharged to have ENT remove his packing in approximately 5 days.  He did receive some Tylenol for pain.      ____________________________________________   FINAL CLINICAL IMPRESSION(S) / ED DIAGNOSES  Final diagnoses:  Epistaxis     ED Discharge Orders         Ordered    cephALEXin (KEFLEX) 500 MG capsule  4 times daily,   Status:  Discontinued     06/17/18 0607    cephALEXin (KEFLEX) 500 MG capsule  4 times daily     06/17/18 0608           Note:  This document was prepared using Dragon voice recognition software and may include unintentional dictation errors.    Loney Hering, MD 06/17/18 [phone]

## 2018-06-17 NOTE — ED Notes (Signed)
Pt at this time is ready for DC ,  His son is going home to get o2 tank, and will return here to provide transport home .

## 2018-08-28 ENCOUNTER — Other Ambulatory Visit: Payer: Self-pay | Admitting: Internal Medicine

## 2018-10-07 IMAGING — CR DG CHEST 1V PORT
2 series · 2 of 2 positions shown · non-contrast
Comparison: Yesterday

CLINICAL DATA: Chest tube after CABG

EXAM:
PORTABLE CHEST 1 VIEW

[AP (1 of 2)]
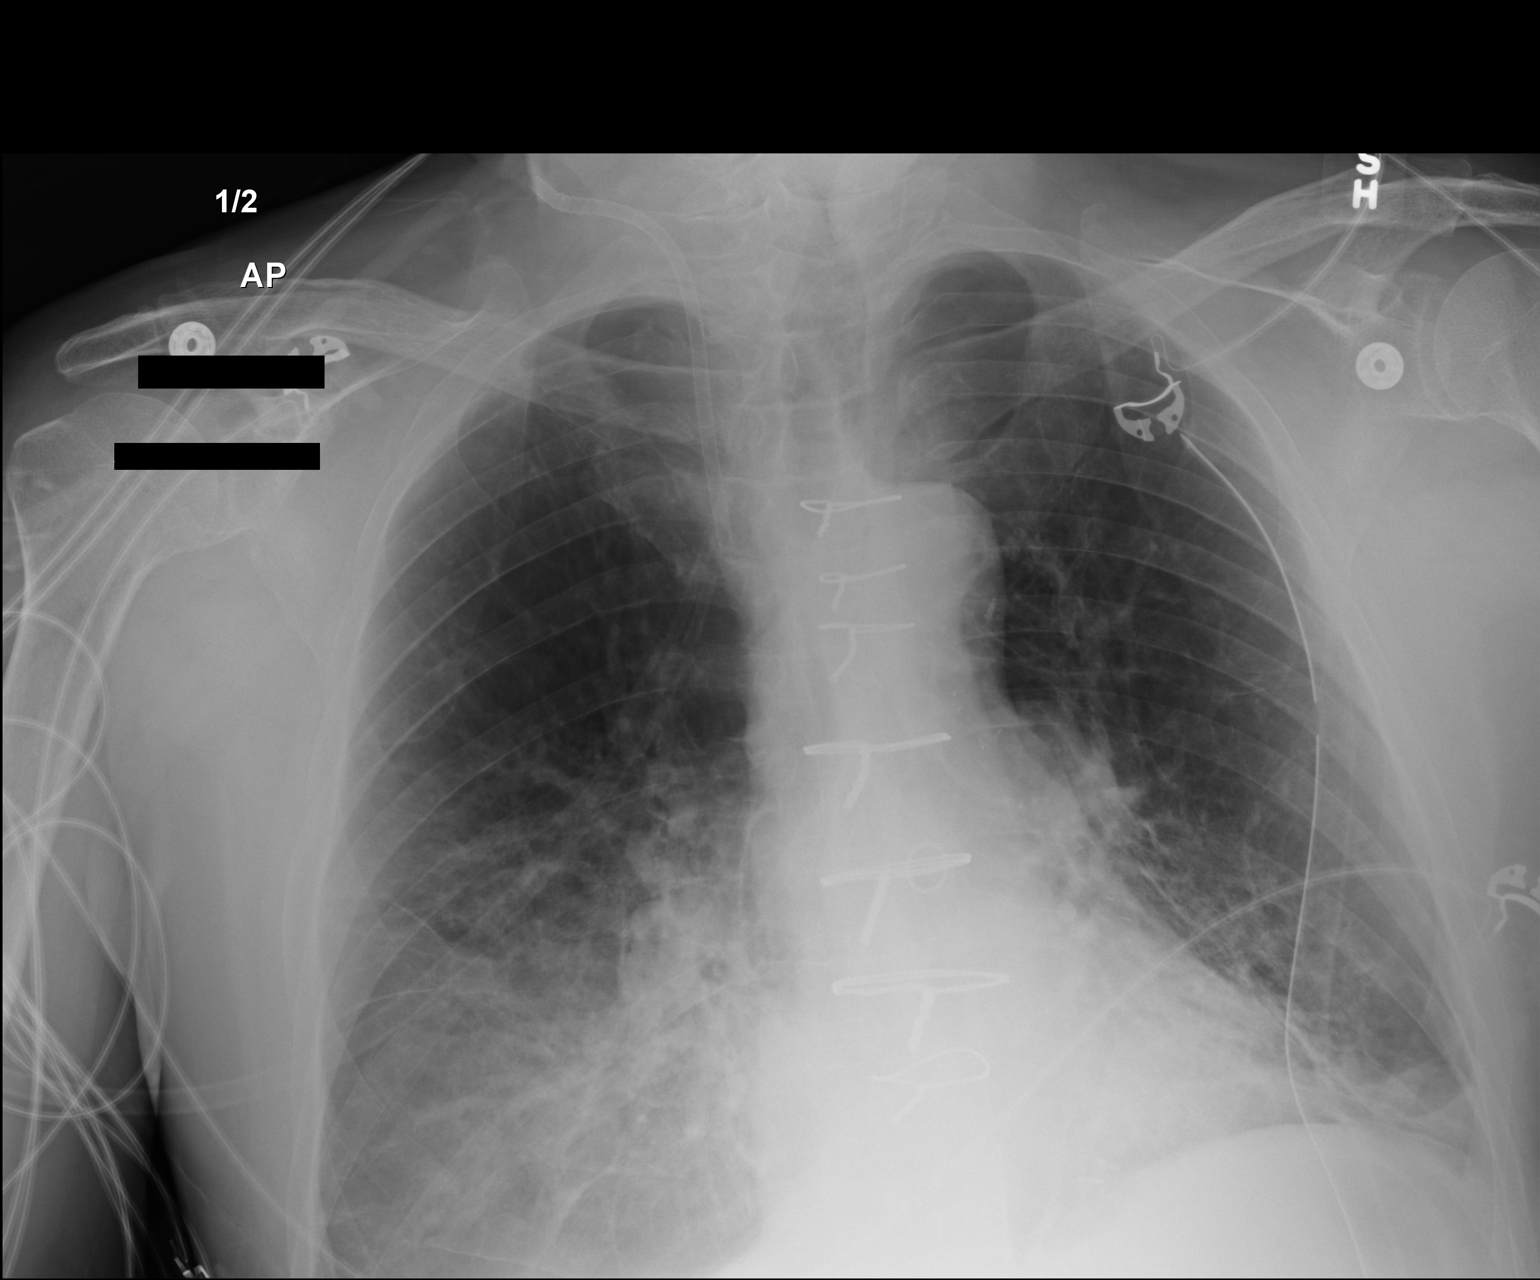

[AP (2 of 2)]
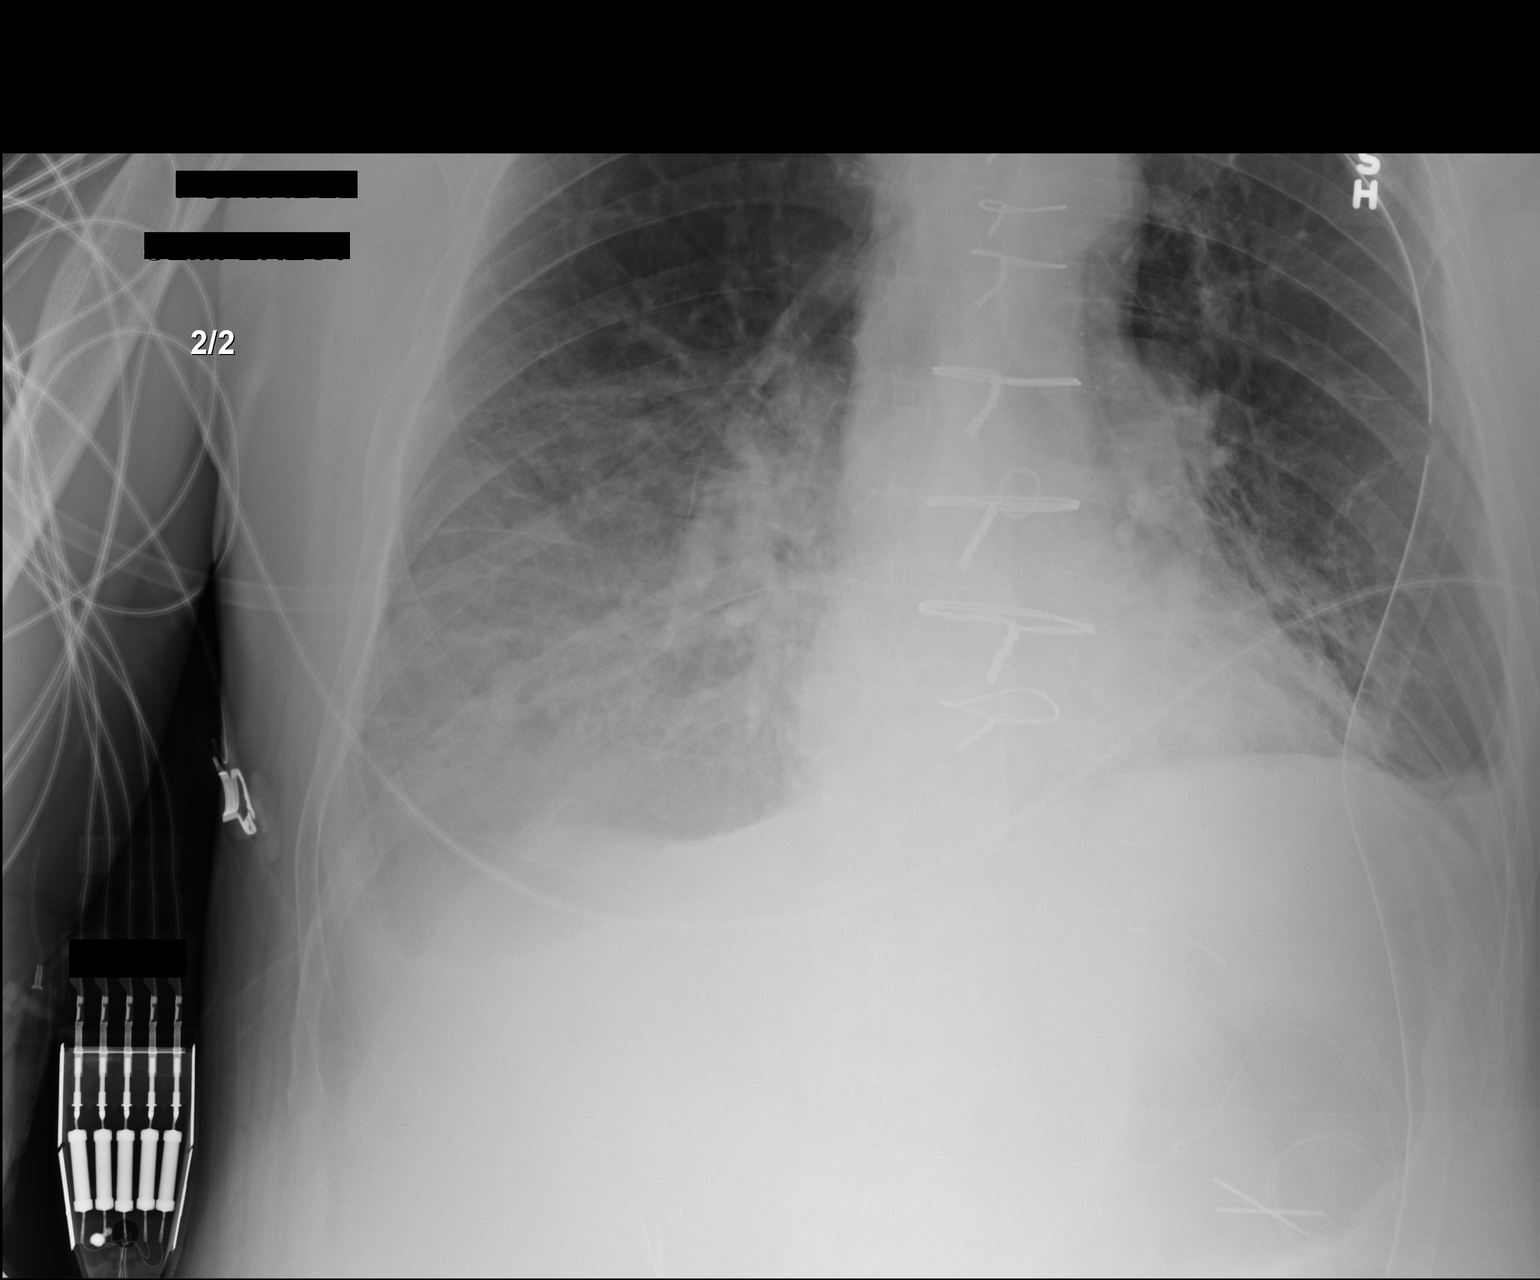

[2 of 2 positions shown; findings below may reference images not displayed]

FINDINGS: Swan-Ganz catheter and midline thoracic drain have been removed.
There is still left-sided chest tube. No visible pneumothorax.

Status post CABG. Stable normal heart size. Advanced emphysema with
layering pleural effusions and atelectasis. Interstitial coarsening
in the lower lungs.
IMPRESSION: 1. Left chest tube without visible pneumothorax.
2. Layering pleural effusions, more apparent today.
3. Lower zone interstitial coarsening, suspect atypical edema due to
advanced emphysema.

## 2018-10-15 IMAGING — CR DG CHEST 1V PORT
2 series · 2 of 2 positions shown · non-contrast
Comparison: February 21, 2017

CLINICAL DATA: Near syncope.

EXAM:
PORTABLE CHEST 1 VIEW

[AP (1 of 2)]
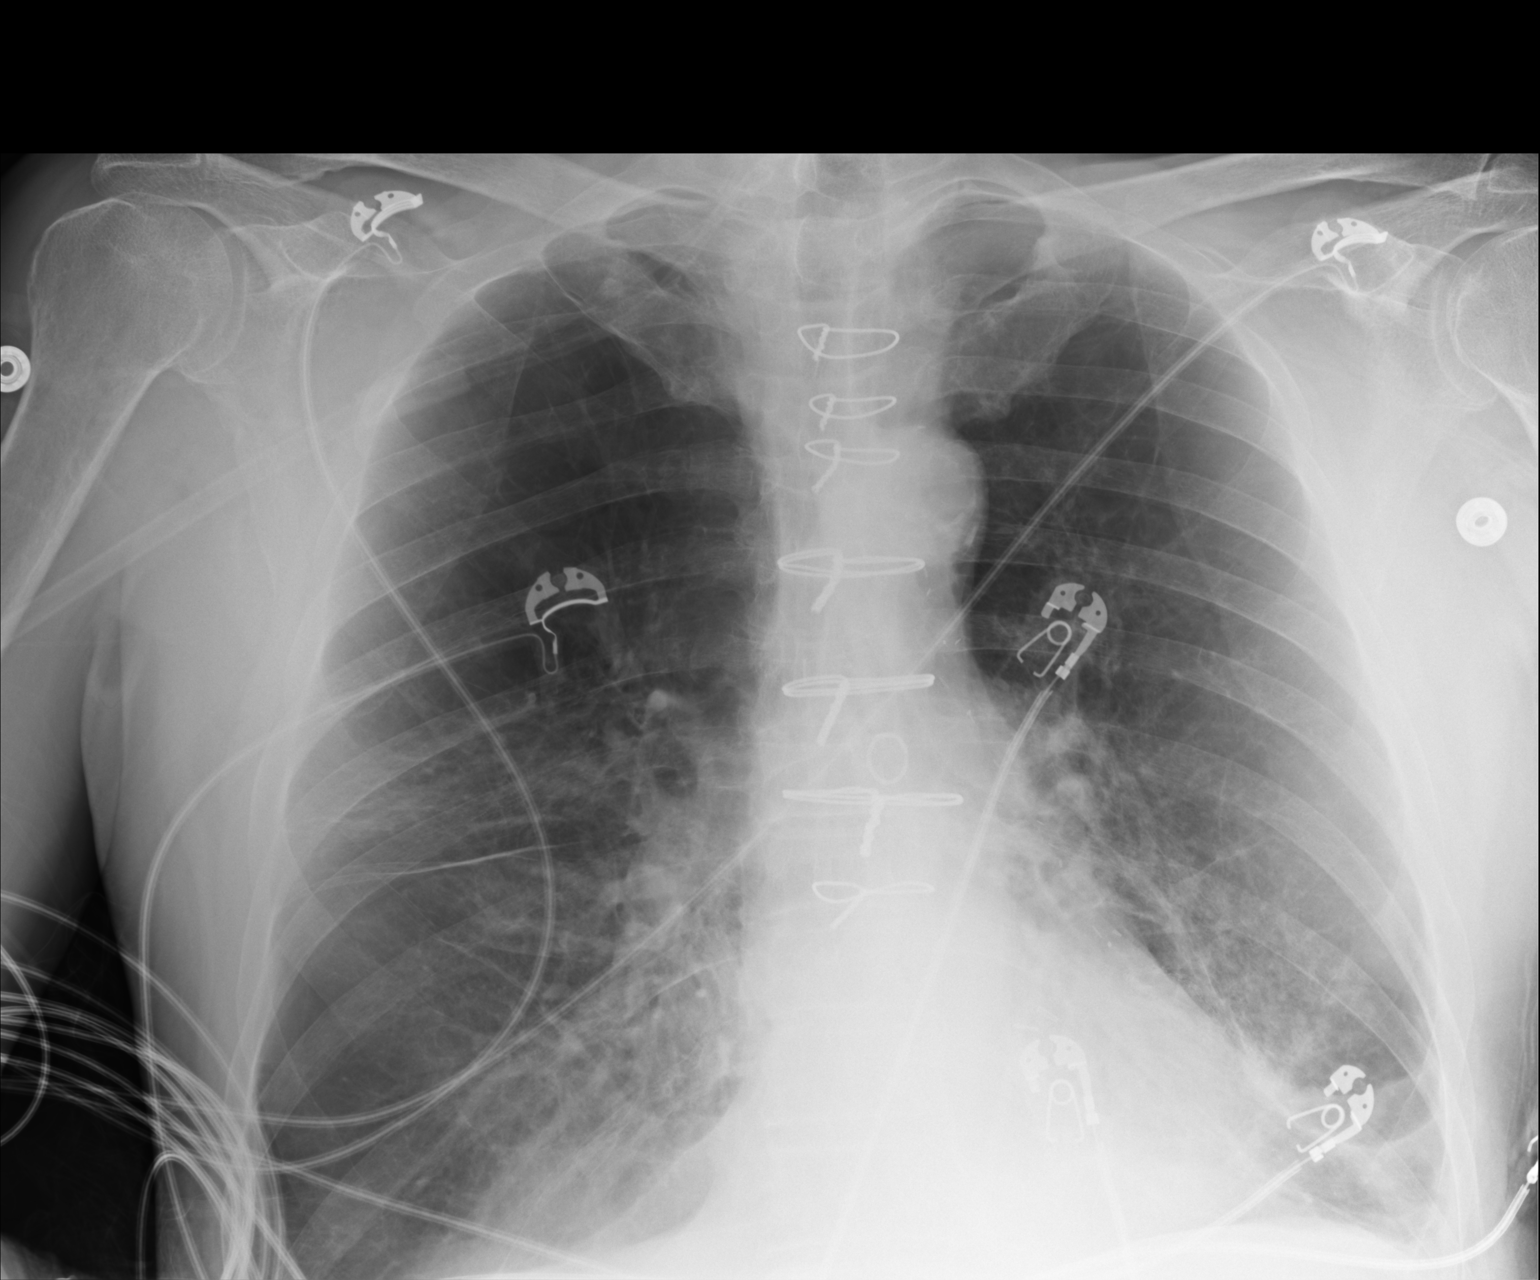

[AP (2 of 2)]
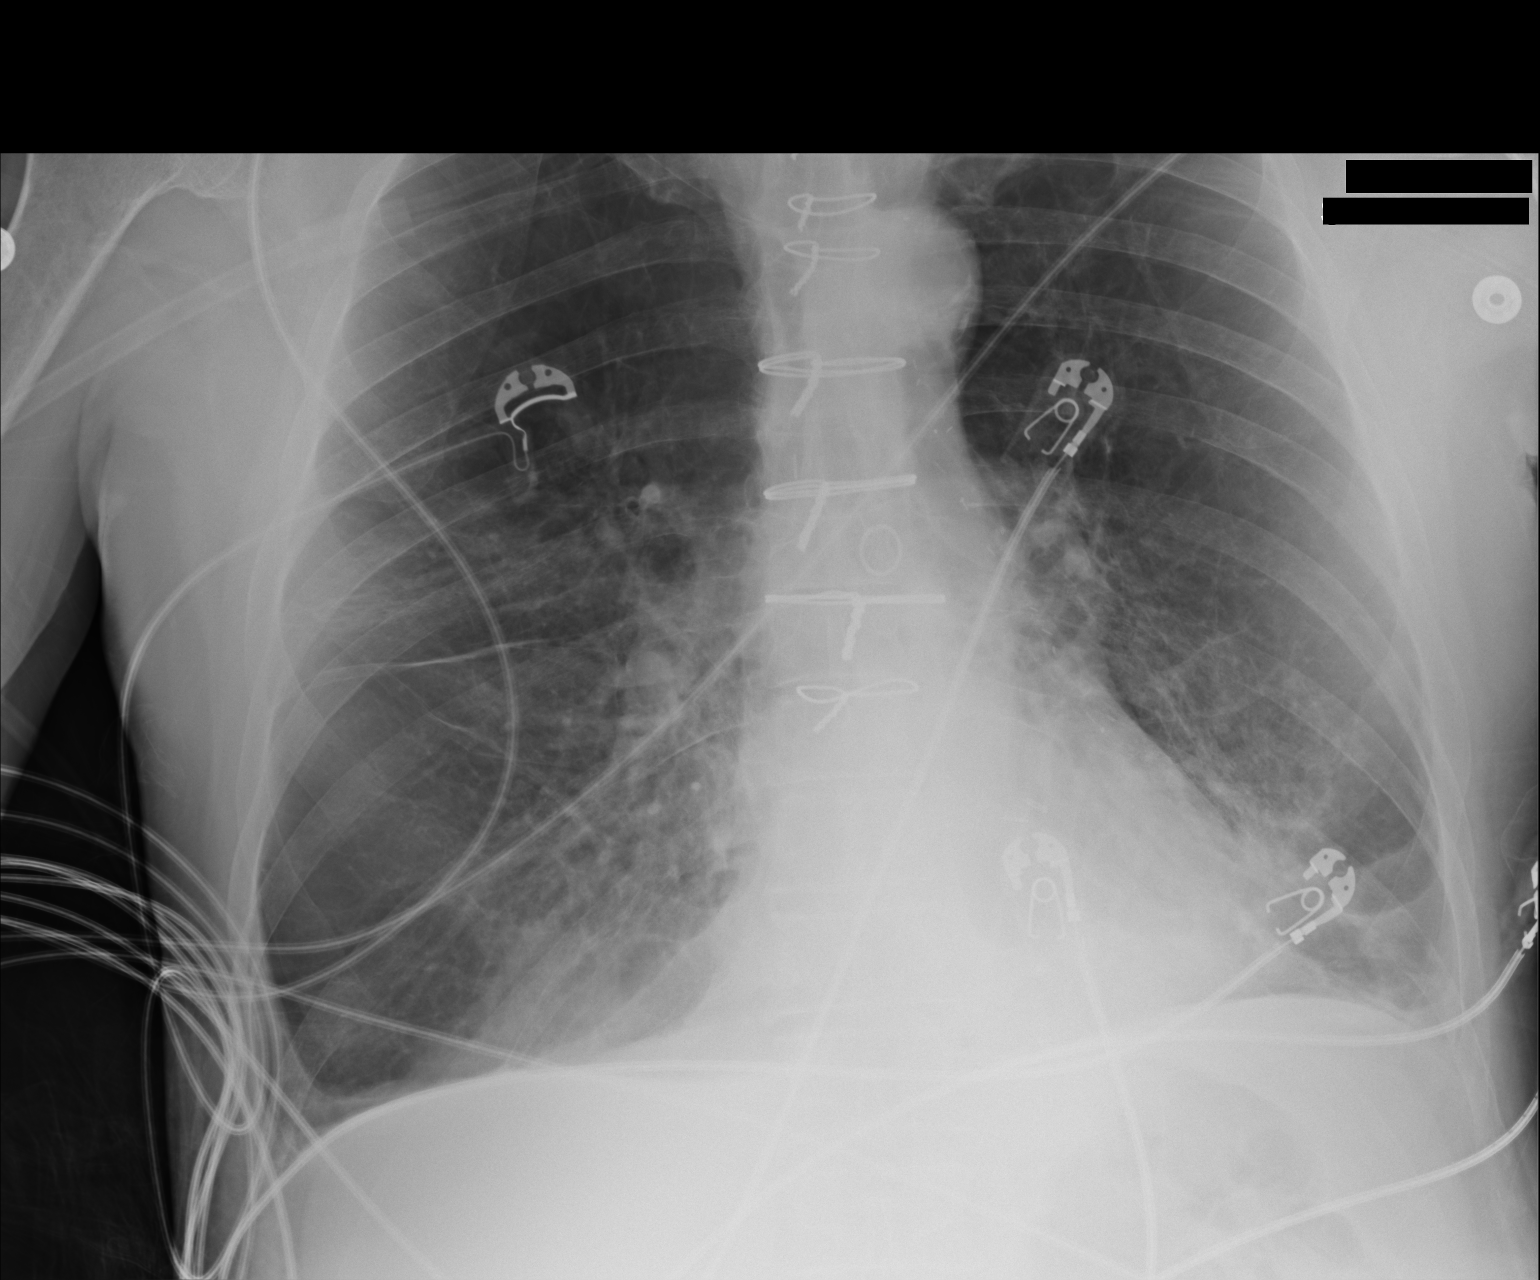

[2 of 2 positions shown; findings below may reference images not displayed]

FINDINGS: No pneumothorax. A small left effusion is identified. Bibasilar
opacities are seen, stable on the left and increased in the medial
right base in the interval. The cardiomediastinal silhouette is
stable.
IMPRESSION: The opacity in the medial right lung base has increased in the
interval. There is probably a tiny right effusion. A tiny left
effusion and underlying opacity on the left is stable. Recommend
follow-up to resolution.

## 2018-11-10 ENCOUNTER — Other Ambulatory Visit: Payer: Self-pay | Admitting: Internal Medicine

## 2019-01-21 IMAGING — DX DG CHEST 1V PORT
1 series · 1 of 1 positions shown · non-contrast
Comparison: Chest radiograph from one day prior.

CLINICAL DATA: Postoperative

EXAM:
PORTABLE CHEST 1 VIEW

[chest ap]
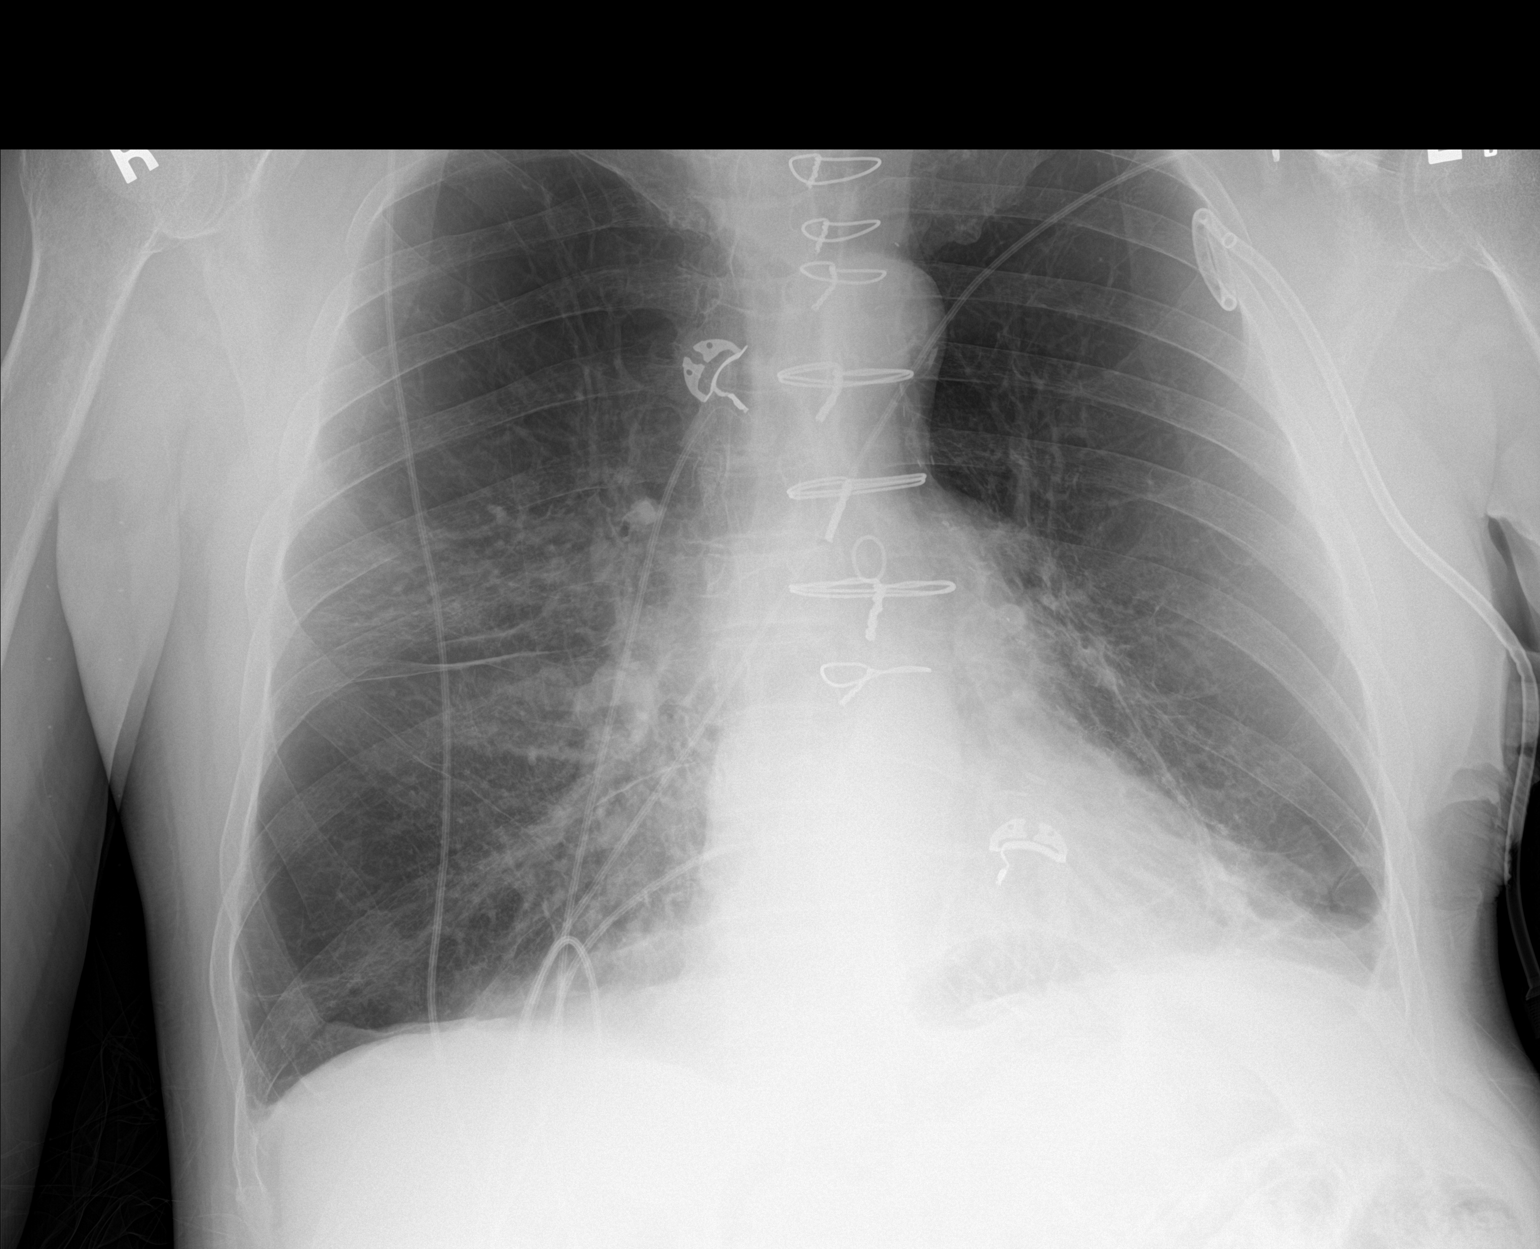

[1 of 1 positions shown; findings below may reference images not displayed]

FINDINGS: Left pigtail pleural catheter is stable with the tip in the
peripheral upper left pleural space. Intact sternotomy wires. Stable
cardiomediastinal silhouette with normal heart size and aortic
atherosclerosis. No pneumothorax. No pleural effusion. Stable
scarring versus atelectasis at the left greater than right lung
bases. No pulmonary edema. No acute consolidative airspace disease.
Advanced emphysema.
IMPRESSION: 1. No pneumothorax.  Stable position of left chest tube.
2. Advanced emphysema. Stable bibasilar scarring versus atelectasis.

## 2019-01-22 ENCOUNTER — Telehealth: Payer: Self-pay

## 2019-01-22 MED ORDER — FUROSEMIDE 20 MG PO TABS: ORAL_TABLET | ORAL | 1 refills | Status: DC

## 2019-01-22 NOTE — Telephone Encounter (Signed)
Rx for furosemide sent to pharmacy.

## 2019-02-22 ENCOUNTER — Telehealth: Payer: Self-pay | Admitting: *Deleted

## 2019-02-22 NOTE — Telephone Encounter (Signed)
Virtual Visit Pre-Appointment Phone Call  "(Name), I am calling you today to discuss your upcoming appointment. We are currently trying to limit exposure to the virus that causes COVID-19 by seeing patients at home rather than in the office."  1. "What is the BEST phone number to call the day of the visit?" - include this in appointment notes  2. "Do you have or have access to (through a family member/friend) a smartphone with video capability that we can use for your visit?" a. If yes - list this number in appt notes as "cell" (if different from BEST phone #) and list the appointment type as a VIDEO visit in appointment notes b. If no - list the appointment type as a PHONE visit in appointment notes  3. Confirm consent - "In the setting of the current Covid19 crisis, you are scheduled for a (VIDEO) visit with your provider on (03/04/2019) at (2:00PM).  Just as we do with many in-office visits, in order for you to participate in this visit, we must obtain consent.  If you'd like, I can send this to your mychart (if signed up) or email for you to review.  Otherwise, I can obtain your verbal consent now.  All virtual visits are billed to your insurance company just like a normal visit would be.  By agreeing to a virtual visit, we'd like you to understand that the technology does not allow for your provider to perform an examination, and thus may limit your provider's ability to fully assess your condition. If your provider identifies any concerns that need to be evaluated in person, we will make arrangements to do so.  Finally, though the technology is pretty good, we cannot assure that it will always work on either your or our end, and in the setting of a video visit, we may have to convert it to a phone-only visit.  In either situation, we cannot ensure that we have a secure connection.  Are you willing to proceed?" YES  4. Advise patient to be prepared - "Two hours prior to your appointment, go  ahead and check your blood pressure, pulse, oxygen saturation, and your weight (if you have the equipment to check those) and write them all down. When your visit starts, your provider will ask you for this information. If you have an Apple Watch or Kardia device, please plan to have heart rate information ready on the day of your appointment. Please have a pen and paper handy nearby the day of the visit as well."  5. Give patient instructions for MyChart download to smartphone OR [email or web address] as below if video visit (depending on what platform provider is using)  6. Inform patient they will receive a phone call 15 minutes prior to their appointment time (may be from unknown caller ID) so they should be prepared to answer    TELEPHONE CALL NOTE  Thomas Trujillo has been deemed a candidate for a follow-up tele-health visit to limit community exposure during the Covid-19 pandemic. I spoke with the patient via phone to ensure availability of phone/video source, confirm preferred email & phone number, and discuss instructions and expectations.  I reminded Thomas Trujillo to be prepared with any vital sign and/or heart rhythm information that could potentially be obtained via home monitoring, at the time of his visit. I reminded Thomas Trujillo to expect a phone call prior to his visit.  Thomas Trujillo, CMA 02/22/2019 3:53 PM   INSTRUCTIONS FOR DOWNLOADING THE [place] APP  TO SMARTPHONE  - The patient must first make sure to have activated MyChart and know their login information - If Apple, go to [employer] and type in MyChart in the search bar and download the app. If Android, ask patient to go to [employer] and type in [place] in the search bar and download the app. The app is free but as with any other app downloads, their phone may require them to verify saved payment information or Apple/Android password.  - The patient will need to then log into the app with their MyChart username and  password, and select [facility] as their healthcare provider to link the account. When it is time for your visit, go to the MyChart app, find appointments, and click Begin Video Visit. Be sure to Select Allow for your device to access the Microphone and Camera for your visit. You will then be connected, and your provider will be with you shortly.  **If they have any issues connecting, or need assistance please contact MyChart service desk [phone])**  **If using a computer, in order to ensure the best quality for their visit they will need to use either of the following Internet Browsers: [employer], or Google Chrome**  IF USING DOXIMITY or DOXY.ME - The patient will receive a link just prior to their visit by text.     FULL LENGTH CONSENT FOR TELE-HEALTH VISIT   I hereby voluntarily request, consent and authorize [facility] and its employed or contracted physicians, physician assistants, nurse practitioners or other licensed health care professionals (the Practitioner), to provide me with telemedicine health care services (the "Services") as deemed necessary by the treating Practitioner. I acknowledge and consent to receive the Services by the Practitioner via telemedicine. I understand that the telemedicine visit will involve communicating with the Practitioner through live audiovisual communication technology and the disclosure of certain medical information by electronic transmission. I acknowledge that I have been given the opportunity to request an in-person assessment or other available alternative prior to the telemedicine visit and am voluntarily participating in the telemedicine visit.  I understand that I have the right to withhold or withdraw my consent to the use of telemedicine in the course of my care at any time, without affecting my right to future care or treatment, and that the Practitioner or I may terminate the telemedicine visit at any time. I  understand that I have the right to inspect all information obtained and/or recorded in the course of the telemedicine visit and may receive copies of available information for a reasonable fee.  I understand that some of the potential risks of receiving the Services via telemedicine include:  [name] Delay or interruption in medical evaluation due to technological equipment failure or disruption; . Information transmitted may not be sufficient (e.g. poor resolution of images) to allow for appropriate medical decision making by the Practitioner; and/or  . In rare instances, security protocols could fail, causing a breach of personal health information.  Furthermore, I acknowledge that it is my responsibility to provide information about my medical history, conditions and care that is complete and accurate to the best of my ability. I acknowledge that Practitioner's advice, recommendations, and/or decision may be based on factors not within their control, such as incomplete or inaccurate data provided by me or distortions of diagnostic images or specimens that may result from electronic transmissions. I understand that the practice of medicine is not an exact science and that Practitioner makes no warranties  or guarantees regarding treatment outcomes. I acknowledge that I will receive a copy of this consent concurrently upon execution via email to the email address I last provided but may also request a printed copy by calling the office of [facility].    I understand that my insurance will be billed for this visit.   I have read or had this consent read to me. . I understand the contents of this consent, which adequately explains the benefits and risks of the Services being provided via telemedicine.  . I have been provided ample opportunity to ask questions regarding this consent and the Services and have had my questions answered to my satisfaction. . I give my informed consent for the services to be  provided through the use of telemedicine in my medical care  By participating in this telemedicine visit I agree to the above.

## 2019-03-04 ENCOUNTER — Encounter: Payer: Self-pay | Admitting: Internal Medicine

## 2019-03-04 ENCOUNTER — Other Ambulatory Visit: Payer: Self-pay

## 2019-03-04 ENCOUNTER — Encounter: Payer: Medicare Other | Admitting: Internal Medicine

## 2019-03-04 NOTE — Progress Notes (Signed)
This encounter was created in error - please disregard.

## 2019-03-08 ENCOUNTER — Other Ambulatory Visit: Payer: Self-pay

## 2019-03-08 ENCOUNTER — Telehealth (INDEPENDENT_AMBULATORY_CARE_PROVIDER_SITE_OTHER): Payer: Medicare Other | Admitting: Internal Medicine

## 2019-03-08 ENCOUNTER — Encounter: Payer: Self-pay | Admitting: Internal Medicine

## 2019-03-08 VITALS — BP 129/69 | HR 93 | Ht 64.0 in | Wt 158.0 lb

## 2019-03-08 DIAGNOSIS — J449 Chronic obstructive pulmonary disease, unspecified: Secondary | ICD-10-CM

## 2019-03-08 DIAGNOSIS — I2510 Atherosclerotic heart disease of native coronary artery without angina pectoris: Secondary | ICD-10-CM | POA: Diagnosis not present

## 2019-03-08 DIAGNOSIS — E785 Hyperlipidemia, unspecified: Secondary | ICD-10-CM

## 2019-03-08 DIAGNOSIS — J9611 Chronic respiratory failure with hypoxia: Secondary | ICD-10-CM | POA: Insufficient documentation

## 2019-03-08 DIAGNOSIS — Z7189 Other specified counseling: Secondary | ICD-10-CM

## 2019-03-08 DIAGNOSIS — I255 Ischemic cardiomyopathy: Secondary | ICD-10-CM

## 2019-03-08 NOTE — Progress Notes (Signed)
Virtual Visit via Video Note   This visit type was conducted due to national recommendations for restrictions regarding the COVID-19 Pandemic (e.g. social distancing) in an effort to limit this patient's exposure and mitigate transmission in our community.  Due to his co-morbid illnesses, this patient is at least at moderate risk for complications without adequate follow up.  This format is felt to be most appropriate for this patient at this time.  All issues noted in this document were discussed and addressed.  A limited physical exam was performed with this format.  Please refer to the patient's chart for his consent to telehealth for [facility].   Date:  03/08/2019   ID:  Thomas Trujillo, DOB 08/26/1946, MRN [id number]  Patient Location: Home Provider Location: Home  PCP:  [name], MD  Cardiologist:  [name], MD  Electrophysiologist:  None   Evaluation Performed:  Follow-Up Visit  Chief Complaint:  Follow-up coronary artery disease.  History of Present Illness:    Thomas Trujillo is a 73 y.o. male with history of coronary artery disease status post CABG (01/2017) in the setting of NSTEMI, ischemic cardiomyopathy, hyperlipidemia, and COPD.  I last saw him in 10/2017, at which time he was doing relatively well other than chronic dyspnea on exertion requiring supplemental oxygen with exercise.  Since then, Thomas Trujillo reports that his breathing has gradually worsened (though he usually does not feel short of breath).  He is now wearing supplemental oxygen (2 L via nasal cannula) almost all the time.  He notes that his oxygen saturation will drop into the upper 80's with light activity if he does not use his oxygen.  He has not had any chest pain, palpitations, lightheadedness, orthopnea, or edema.  He is scheduled for follow-up with his pulmonologist, Dr. Raul Del, next month.  Thomas Trujillo reports some confusion regarding continued use of metoprolol.  We had agreed to  discontinue this at his last visit due to soft blood pressures.  However, it has remained on his medication list with his PCP (though Thomas Trujillo has not been taking it).  He reports home blood pressures typically in the 185-631 systolic range, though he has had some readings slightly lower than that.  He has not had any pressures above 140 mmHg.  The patient does not have symptoms concerning for COVID-19 infection (fever, chills, cough, or new shortness of breath).    Past Medical History:  Diagnosis Date  . Bleb, lung ([place])   . COPD (chronic obstructive pulmonary disease) ([place])   . Coronary artery disease 02/14/2017   NSTEMI with urgent CABG (LIMA-LAD and SVG->ramus)  . Hyperlipidemia   . Ischemic cardiomyopathy   . Patient denies medical problems   . Pneumothorax 05/2017   Left   Past Surgical History:  Procedure Laterality Date  . ABDOMINAL HYSTERECTOMY    . CATARACT EXTRACTION, BILATERAL    . CORONARY ARTERY BYPASS GRAFT N/A 02/14/2017   Procedure: CORONARY ARTERY BYPASS GRAFTING (CABG) x 2 , using left internal mammary artery and right leg greater saphenous vein harvested endoscopically LIMA-LAD, SVG-RAMUS;  Surgeon: [name], MD;  Location: [place];  Service: Open Heart Surgery;  Laterality: N/A;  . ENDOVEIN HARVEST OF GREATER SAPHENOUS VEIN Right 02/14/2017   Procedure: ENDOVEIN HARVEST OF RIGHT THIGH GREATER SAPHENOUS VEIN;  Surgeon: [name], MD;  Location: [place];  Service: Open Heart Surgery;  Laterality: Right;  . LEFT HEART CATH AND CORONARY ANGIOGRAPHY N/A 02/14/2017   Procedure: Left  Heart Cath and Coronary Angiography;  Surgeon: [name], MD;  Location: [place] [facility];  Service: Cardiovascular;  Laterality: N/A;  . NASAL POLYP SURGERY    . STAPLING OF BLEBS N/A 02/14/2017   Procedure: STAPLING OF LARGE LEFT UPPER LOBE PULMONARY BLEB;  Surgeon: [name], MD;  Location: [place];  Service: Open Heart Surgery;  Laterality: N/A;  . TEE WITHOUT  CARDIOVERSION N/A 02/14/2017   Procedure: TRANSESOPHAGEAL ECHOCARDIOGRAM (TEE);  Surgeon: [name], MD;  Location: [place];  Service: Open Heart Surgery;  Laterality: N/A;  . VIDEO ASSISTED THORACOSCOPY (VATS) W/TALC PLEUADESIS N/A 11/23/2017   Procedure: VIDEO ASSISTED THORACOSCOPY (VATS) W/TALC PLEUADESIS;  Surgeon: Nestor Lewandowsky, MD;  Location: [facility];  Service: Thoracic;  Laterality: N/A;     Current Meds  Medication Sig  . aspirin EC 81 MG tablet Take 1 tablet (81 mg total) by mouth daily.  [name] atorvastatin (LIPITOR) 40 MG tablet Take 40 mg by mouth daily.  . budesonide (PULMICORT) 0.5 MG/2ML nebulizer solution Take 0.5 mg by nebulization daily.  . ferrous sulfate 325 (65 FE) MG tablet Take 325 mg by mouth daily with breakfast.  . furosemide (LASIX) 20 MG tablet Take 1-2 tabs as directed once daily with additional dose if gains 2 lbs in a day or 5lbs in a week.  [name] ipratropium-albuterol (DUONEB) 0.5-2.5 (3) MG/3ML SOLN Take 3 mLs by nebulization every 6 (six) hours as needed. When awake   . Multiple Vitamins-Minerals (MULTIVITAMIN ADULT PO) Take 1 tablet by mouth daily.  . OXYGEN Inhale into the lungs. By nasal cannula and wean as tolerated to keep O2 sats >90%  . potassium chloride (K-DUR) 10 MEQ tablet Take 10 mEq by mouth daily.  . tamsulosin (FLOMAX) 0.4 MG CAPS capsule Take 1 capsule (0.4 mg total) by mouth daily.     Allergies:   No known allergies   Social History   Tobacco Use  . Smoking status: Former Smoker    Last attempt to quit: 04/06/2007    Years since quitting: 11.9  . Smokeless tobacco: Never Used  Substance Use Topics  . Alcohol use: No  . Drug use: No     Family Hx: The patient's family history includes Obesity in his son.  ROS:   Please see the history of present illness.   All other systems reviewed and are negative.   Prior CV studies:   The following studies were reviewed today:  Echocardiogram (04/21/2017): Suboptimal windows.  Normal LV size  with mild LVH.  LVEF 55-60% with possible anterior and anteroseptal hypokinesis.  Grade 1 diastolic dysfunction.  Mildly dilated RV with normal wall thickness and systolic function.  LHC (02/14/2017): Significant two-vessel coronary artery disease with sequential 95% ostial and 8090% mid LAD stenoses as well as 80% ostial ramus stenosis.  LVEF 40-45% with mid and apical anterior hypokinesis.  Labs/Other Tests and Data Reviewed:    EKG:  An ECG dated 11/18/2017 was personally reviewed today and demonstrated:  Sinus tachycardia with low voltage and borderline QT prolongation.  Recent Labs: 06/17/2018: Hemoglobin 14.6; Platelets 204   Recent Lipid Panel Lab Results  Component Value Date/Time   CHOL 114 02/14/2017 08:43 AM   TRIG 51 02/14/2017 08:43 AM   HDL 39 (L) 02/14/2017 08:43 AM   CHOLHDL 2.9 02/14/2017 08:43 AM   LDLCALC 65 02/14/2017 08:43 AM    Wt Readings from Last 3 Encounters:  03/08/19 158 lb (71.7 kg)  06/17/18 156 lb (70.8 kg)  12/01/17 152 lb 12.8 oz (69.3 kg)     Objective:    Vital Signs:  BP 129/69 (BP Location: Left Arm, Patient Position: Sitting, Cuff Size: Normal)   Pulse 93   Ht 5\' 4"  (1.626 m)   Wt 158 lb (71.7 kg)   BMI 27.12 kg/m    VITAL SIGNS:  reviewed GEN:  NAD.  Supplemental oxygen in place.  ASSESSMENT & PLAN:    Coronary artery disease: No chest pain noted.  Progressive dyspnea likely multifactorial, though overall I suspect it is likely related to underlying lung disease as opposed to progressive CAD.  Continue current medications for secondary prevention.  Ischemic cardiomyopathy LVEF normal on post CABG follow-up echo.  Metoprolol previously held due to borderline low blood pressures.  No additional medications to be started at this time.  Patient should continue furosemide 20 mg daily, with an additional dose for weight gain/edema.  Most recent labs by PCP earlier this year showed stable renal function and electrolytes.  Hyperlipidemia:  LDL at goal (44) on outside labs from 12/27/2018.  ALT also normal.  Continue high intensity statin therapy.  Chronic respiratory failure with hypoxia secondary to COPD: Likely multifactorial though driven predominantly by underlying lung disease.  Patient does not report weight gain, orthopnea, or edema to suggest progressive heart failure.  Also, EF normal on most recent echo.  He should continue supplemental oxygen use and follow-up with Dr. Raul Del, as previously recommended.  COVID-19 Education: The signs and symptoms of COVID-19 were discussed with the patient and how to seek care for testing (follow up with PCP or arrange E-visit).  The importance of social distancing was discussed today.  Time:   Today, I have spent 18 minutes with the patient with telehealth technology discussing the above problems.   Medication Adjustments/Labs and Tests Ordered: Current medicines are reviewed at length with the patient today.  Concerns regarding medicines are outlined above.   Tests Ordered: None.  Medication Changes: None.  Disposition:  Follow up in 4-5 month(s)  Signed, [name], MD  03/08/2019 10:09 AM    [facility]

## 2019-03-08 NOTE — Patient Instructions (Signed)
Medication Instructions:  Your physician recommends that you continue on your current medications as directed. Please refer to the Current Medication list given to you today.  If you need a refill on your cardiac medications before your next appointment, please call your pharmacy.   Lab work: None ordered If you have labs (blood work) drawn today and your tests are completely normal, you will receive your results only by: [name] MyChart Message (if you have MyChart) OR . A paper copy in the mail If you have any lab test that is abnormal or we need to change your treatment, we will call you to review the results.  Testing/Procedures: None ordered Follow-Up: At [facility], you and your health needs are our priority.  As part of our continuing mission to provide you with exceptional heart care, we have created designated Provider Care Teams.  These Care Teams include your primary Cardiologist (physician) and Advanced Practice Providers (APPs -  Physician Assistants and Nurse Practitioners) who all work together to provide you with the care you need, when you need it. You will need a follow up appointment in 4-5 months.  Please call our office 2 months in advance to schedule this appointment.  You may see [name], MD or one of the following Advanced Practice Providers on your designated Care Team:   Murray Hodgkins, NP Christell Faith, PA-C . Marrianne Mood, PA-C

## 2019-07-08 ENCOUNTER — Ambulatory Visit: Payer: Medicare Other | Admitting: Internal Medicine

## 2019-07-08 NOTE — Progress Notes (Deleted)
Follow-up Outpatient Visit Date: 07/08/2019  Primary Care Provider: [name], MD [facility] [place] [address]  Chief Complaint: ***  HPI:  Thomas Trujillo is a 73 y.o. year-old male with history of coronary artery disease status post CABG (01/2017) in the setting of NSTEMI, ischemic cardiomyopathy, hyperlipidemia, and COPD, who presents for follow-up of coronary artery disease and cardiomyopathy.  We last spoke in May, at he reported that his breathing had gradually worsened and had necessitated supplemental oxygen use.  We did not make any medication changes or pursue further testing at that time, as it was felt that his dyspnea and hypoxia were primarily driven by underlying lung disease.  --------------------------------------------------------------------------------------------------  Past Medical History:  Diagnosis Date  . Bleb, lung ([place])   . COPD (chronic obstructive pulmonary disease) ([place])   . Coronary artery disease 02/14/2017   NSTEMI with urgent CABG (LIMA-LAD and SVG->ramus)  . Hyperlipidemia   . Ischemic cardiomyopathy   . Patient denies medical problems   . Pneumothorax 05/2017   Left   Past Surgical History:  Procedure Laterality Date  . ABDOMINAL HYSTERECTOMY    . CATARACT EXTRACTION, BILATERAL    . CORONARY ARTERY BYPASS GRAFT N/A 02/14/2017   Procedure: CORONARY ARTERY BYPASS GRAFTING (CABG) x 2 , using left internal mammary artery and right leg greater saphenous vein harvested endoscopically LIMA-LAD, SVG-RAMUS;  Surgeon: [name], MD;  Location: [place];  Service: Open Heart Surgery;  Laterality: N/A;  . ENDOVEIN HARVEST OF GREATER SAPHENOUS VEIN Right 02/14/2017   Procedure: ENDOVEIN HARVEST OF RIGHT THIGH GREATER SAPHENOUS VEIN;  Surgeon: [name], MD;  Location: [place];  Service: Open Heart Surgery;  Laterality: Right;  . LEFT HEART CATH AND CORONARY ANGIOGRAPHY N/A 02/14/2017   Procedure: Left Heart Cath and Coronary  Angiography;  Surgeon: [name], MD;  Location: [place] [facility];  Service: Cardiovascular;  Laterality: N/A;  . NASAL POLYP SURGERY    . STAPLING OF BLEBS N/A 02/14/2017   Procedure: STAPLING OF LARGE LEFT UPPER LOBE PULMONARY BLEB;  Surgeon: [name], MD;  Location: [place];  Service: Open Heart Surgery;  Laterality: N/A;  . TEE WITHOUT CARDIOVERSION N/A 02/14/2017   Procedure: TRANSESOPHAGEAL ECHOCARDIOGRAM (TEE);  Surgeon: [name], MD;  Location: [place];  Service: Open Heart Surgery;  Laterality: N/A;  . VIDEO ASSISTED THORACOSCOPY (VATS) W/TALC PLEUADESIS N/A 11/23/2017   Procedure: VIDEO ASSISTED THORACOSCOPY (VATS) W/TALC PLEUADESIS;  Surgeon: Nestor Lewandowsky, MD;  Location: [facility];  Service: Thoracic;  Laterality: N/A;    No outpatient medications have been marked as taking for the 07/08/19 encounter (Appointment) with Particia Strahm, Harrell Gave, MD.    Allergies: No known allergies  Social History   Tobacco Use  . Smoking status: Former Smoker    Quit date: 04/06/2007    Years since quitting: 12.2  . Smokeless tobacco: Never Used  Substance Use Topics  . Alcohol use: No  . Drug use: No    Family History  Problem Relation Age of Onset  . Obesity Son     Review of Systems: A 12-system review of systems was performed and was negative except as noted in the HPI.  --------------------------------------------------------------------------------------------------  Physical Exam: There were no vitals taken for this visit.  General:  *** HEENT: No conjunctival pallor or scleral icterus. Moist mucous membranes.  OP clear. Neck: Supple without lymphadenopathy, thyromegaly, JVD, or HJR. No carotid bruit. Lungs: Normal work of breathing. Clear to auscultation bilaterally without  wheezes or crackles. Heart: Regular rate and rhythm without murmurs, rubs, or gallops. Non-displaced PMI. Abd: Bowel sounds present. Soft, NT/ND without hepatosplenomegaly Ext: No lower  extremity edema. Radial, PT, and DP pulses are 2+ bilaterally. Skin: Warm and dry without rash.  EKG:  ***  Lab Results  Component Value Date   WBC 6.4 06/17/2018   HGB 14.6 06/17/2018   HCT 42.1 06/17/2018   MCV 91.8 06/17/2018   PLT 204 06/17/2018    Lab Results  Component Value Date   NA 136 11/26/2017   K 4.1 11/26/2017   CL 98 (L) 11/26/2017   CO2 30 11/26/2017   BUN 27 (H) 11/26/2017   CREATININE 1.06 11/26/2017   GLUCOSE 90 11/26/2017   ALT 19 04/05/2017    Lab Results  Component Value Date   CHOL 114 02/14/2017   HDL 39 (L) 02/14/2017   LDLCALC 65 02/14/2017   TRIG 51 02/14/2017   CHOLHDL 2.9 02/14/2017    --------------------------------------------------------------------------------------------------  ASSESSMENT AND PLAN: Harrell Gave March Steyer, MD 07/08/2019 7:24 AM

## 2019-07-16 ENCOUNTER — Other Ambulatory Visit: Payer: Self-pay | Admitting: Internal Medicine

## 2019-11-22 ENCOUNTER — Ambulatory Visit: Payer: Medicare Other | Admitting: Physician Assistant

## 2019-12-20 ENCOUNTER — Telehealth: Payer: Self-pay | Admitting: Internal Medicine

## 2019-12-20 NOTE — Telephone Encounter (Signed)
The patient has history of CAD s/p CABG and should have indefinite cardiology follow-up.  However, if he wished to decline this, that is her prerogative.  [name], MD [facility]

## 2019-12-20 NOTE — Telephone Encounter (Signed)
Patient calling back after reaching out due to a recall. Patient states he left his last appointment with the mindset of "follow up as needed" and he does not want [place] to keep calling. Recall will be deleted

## 2021-08-19 ENCOUNTER — Encounter: Payer: Self-pay | Admitting: *Deleted

## 2021-09-07 ENCOUNTER — Encounter: Payer: Self-pay | Admitting: Urology

## 2021-09-07 ENCOUNTER — Other Ambulatory Visit: Payer: Self-pay

## 2021-09-07 ENCOUNTER — Ambulatory Visit (INDEPENDENT_AMBULATORY_CARE_PROVIDER_SITE_OTHER): Payer: Medicare Other | Admitting: Urology

## 2021-09-07 ENCOUNTER — Telehealth: Payer: Self-pay | Admitting: Urology

## 2021-09-07 VITALS — BP 159/73 | HR 113 | Ht 63.0 in | Wt 157.0 lb

## 2021-09-07 DIAGNOSIS — R972 Elevated prostate specific antigen [PSA]: Secondary | ICD-10-CM | POA: Diagnosis not present

## 2021-09-07 NOTE — Telephone Encounter (Signed)
Pt. Left a message on voicemail asking for someone to call him about his pre procedure instructions because the instructions he received on mychart are different than instructions given by Dr. Diamantina Providence in office. Please call pt. To clarify.

## 2021-09-07 NOTE — Progress Notes (Signed)
09/07/21 11:17 AM   Thomas Trujillo 11-14-1945 [id number]  CC: Elevated PSA  HPI: Comorbid 75 year old male with past medical history notable for COPD on oxygen as well as CAD status post CABG in 2018 who presents with an elevated PSA of 34.  This increased from prior value of 26 and April 2022.  He has never had a prostate biopsy, and I do not see that he has seen urology previously.  He is on Flomax for weak urinary stream.  He denies any family history of prostate cancer.   PMH: Past Medical History:  Diagnosis Date   Bleb, lung ([facility])    COPD (chronic obstructive pulmonary disease) ([place])    Coronary artery disease 02/14/2017   NSTEMI with urgent CABG (LIMA-LAD and SVG->ramus)   Hyperlipidemia    Ischemic cardiomyopathy    Patient denies medical problems    Pneumothorax 05/2017   Left    Surgical History: Past Surgical History:  Procedure Laterality Date   ABDOMINAL HYSTERECTOMY     CATARACT EXTRACTION, BILATERAL     CORONARY ARTERY BYPASS GRAFT N/A 02/14/2017   Procedure: CORONARY ARTERY BYPASS GRAFTING (CABG) x 2 , using left internal mammary artery and right leg greater saphenous vein harvested endoscopically LIMA-LAD, SVG-RAMUS;  Surgeon: [name], MD;  Location: [place];  Service: Open Heart Surgery;  Laterality: N/A;   ENDOVEIN HARVEST OF GREATER SAPHENOUS VEIN Right 02/14/2017   Procedure: ENDOVEIN HARVEST OF RIGHT THIGH GREATER SAPHENOUS VEIN;  Surgeon: [name], MD;  Location: [place];  Service: Open Heart Surgery;  Laterality: Right;   LEFT HEART CATH AND CORONARY ANGIOGRAPHY N/A 02/14/2017   Procedure: Left Heart Cath and Coronary Angiography;  Surgeon: [name], MD;  Location: [place] [facility];  Service: Cardiovascular;  Laterality: N/A;   NASAL POLYP SURGERY     STAPLING OF BLEBS N/A 02/14/2017   Procedure: STAPLING OF LARGE LEFT UPPER LOBE PULMONARY BLEB;  Surgeon: [name], MD;  Location: [place];  Service: Open Heart Surgery;   Laterality: N/A;   TEE WITHOUT CARDIOVERSION N/A 02/14/2017   Procedure: TRANSESOPHAGEAL ECHOCARDIOGRAM (TEE);  Surgeon: [name], MD;  Location: [place];  Service: Open Heart Surgery;  Laterality: N/A;   VIDEO ASSISTED THORACOSCOPY (VATS) W/TALC PLEUADESIS N/A 11/23/2017   Procedure: VIDEO ASSISTED THORACOSCOPY (VATS) W/TALC PLEUADESIS;  Surgeon: Nestor Lewandowsky, MD;  Location: [facility];  Service: Thoracic;  Laterality: N/A;     Family History: Family History  Problem Relation Age of Onset   Obesity Son     Social History:  reports that he quit smoking about 14 years ago. His smoking use included cigarettes. He has never used smokeless tobacco. He reports that he does not drink alcohol and does not use drugs.  Physical Exam: BP (!) 159/73   Pulse (!) 113   Ht 5\' 3"  (1.6 m)   Wt 157 lb (71.2 kg)   BMI 27.81 kg/m    Constitutional:  Alert and oriented, No acute distress.  On oxygen, frail-appearing Cardiovascular: No clubbing, cyanosis, or edema. Respiratory: Increased work of breathing, on oxygen GI: Abdomen is soft, nontender, nondistended, no abdominal masses DRE: Prostate 40 g, hard and nodular at right base  Laboratory Data: Reviewed, see HPI  Pertinent Imaging: No cross-sectional imaging to review  Assessment & Plan:   Comorbid 75 year old male with rising PSA, most recently 50 from 76 in April 2022, as well as DRE suspicious for prostate cancer.  Past medical history notable for COPD  on oxygen, and CAD status post CABG in 2018.  We reviewed the implications of an elevated PSA and the uncertainty surrounding it. In general, a man's PSA increases with age and is produced by both normal and cancerous prostate tissue. The differential diagnosis for elevated PSA includes BPH, prostate cancer, infection, recent intercourse/ejaculation, recent urethroscopic manipulation (foley placement/cystoscopy) or trauma, and prostatitis.   Management of an elevated PSA can include  observation or prostate biopsy and we discussed this in detail. Our goal is to detect clinically significant prostate cancers, and manage with either active surveillance, surgery, or radiation for localized disease. Risks of prostate biopsy include bleeding, infection (including life threatening sepsis), pain, and lower urinary symptoms. Hematuria, hematospermia, and blood in the stool are all common after biopsy and can persist up to 4 weeks.   Despite his comorbidities and age, with his significantly rising PSA and suspicious DRE, I strongly recommended prostate biopsy, with likely need for staging imaging if positive prostate cancer.  We briefly discussed different treatment strategies on nonmetastatic first metastatic prostate cancer   Nickolas Madrid, MD 09/07/2021  [place] [address] [place], [place] [address] [phone]

## 2021-09-07 NOTE — Telephone Encounter (Signed)
Spoke with patient and cleared up any misunderstanding. Pt thought we would give him a enema in office and I advised he would do 2 hours prior to prostate biopsy.

## 2021-09-07 NOTE — Patient Instructions (Addendum)
Transrectal Prostate Biopsy Patient Education and Post Procedure Instructions    -Definition A prostate biopsy is the removal of a small amount of tissue from the prostate gland. The tissue is examined to determine whether there is cancer.  -Reasons for Procedure A prostate biopsy is usually done after an abnormal finding by: Digital rectal exam Prostate specific antigen (PSA) blood test A prostate biopsy is the only way to find out if cancer cells are present.  -Possible Complications Problems from the procedure are rare, but all procedures have some risk including: Infection Bruising or lengthy bleeding from the rectum, or in urine or semen Difficulty urinating Reactions to anesthesia Factors that may increase the risk of complications include: Smoking History of bleeding disorders or easy bruising Use of any medications, over-the-counter medications, or herbal supplements Sensitivity or allergy to latex, medications, or anesthesia.  -Prior to Procedure Talk to your doctor about your medications. Blood thinning medications including aspirin should be stopped 1 week prior to procedure. If prescribed by your cardiologist we may need approval before stopping medications. Use a Fleets enema 2 hours before the procedure. Can be purchased at your pharmacy. Antibiotics will be administered in the clinic prior to procedure.  Please make sure you eat a light meal prior to coming in for your appointment. This can help prevent lightheadedness during the procedure and upset stomach from antibiotics. Please bring someone with you to the procedure to drive you home.  -Anesthesia Transrectal biopsy: Local anesthesia--Just the area that is being operated on is numbed using an injectable anesthetic.  -Description of the Procedure Transrectal biopsy--Your doctor will insert a small ultrasound device into the rectum. This device will produce sound waves to create an image of the prostate.  These images will help guide placement of the needle. Your doctor will then insert the needle through the wall of the rectum and into the prostate gland. The procedure should take approximately 15-30 minutes.  -Will It Hurt? You may have discomfort and soreness at the biopsy site. Pain and discomfort after the procedure can be managed with medications.  -Postoperative Care When you return home after the procedure, do the following to help ensure a smooth recovery: Stay hydrated. Drink plenty of fluids for the next few days. Avoid difficult physical activity the day and evening of the procedure. Keep in mind that you may see blood in your urine, stool, or semen for several days. Resume any medications that were stopped when you are advised to do so.  After the sample is taken, it will be sent to a pathologist for examination under a microscope. This doctor will analyze the sample for cancer. You will be scheduled for an appointment to discuss results. If cancer is present, your doctor will work with you to develop a treatment plan.   -Call Your Doctor or Seek Immediate Medical Attention It is important to monitor your recovery. Alert your doctor to any problems. If any of the following occur, call your doctor or go to the emergency room: Fever 100.5 or greater within 1 week post procedure go directly to ER Call the office for: Blood in the urine more than 1 week or in semen for more than 6 weeks post-biopsy Pain that you cannot control with the medications you have been given Pain, burning, urgency, or frequency of urination Cough, shortness of breath, or chest pain- if severe go to ER Heavy rectal bleeding or bleeding that lasts more than 1 week after the biopsy If you  have any questions or concerns please contact our office at [phone]  [facility]  Transrectal Ultrasound-Guided Prostate Biopsy A transrectal ultrasound-guided prostate biopsy is a procedure to  remove samples of prostate tissue for testing. The prostate is a walnut-sized gland that is located below the bladder and in front of the rectum. During this procedure, a small device (probe) is lubricated and put inside the rectum. The probe sends out sound waves that make a picture of the prostate and surrounding tissues (transrectal ultrasound). The images are used to help guide the process of removing the samples. The samples are taken to a lab to be checked for prostate cancer. This procedure is usually done to evaluate the prostate gland of men who have raised (elevated) levels of prostate-specific antigen (PSA), which can be a sign of prostate cancer or prostate enlargement related to aging (benign prostatic hyperplasia, or BPH). Tell a health care provider about: Any allergies you have. All medicines you are taking, including vitamins, herbs, eye drops, creams, and over-the-counter medicines. Any problems you or family members have had with anesthetic medicines. Any bleeding problems you have. Any surgeries you have had. Any medical conditions you have. Any prostate infections you have had. What are the risks? Generally, this is a safe procedure. However, problems may occur, including: Prostate infection. Bleeding from the rectum. Blood in the urine. Allergic reactions to medicines. Damage to surrounding structures such as blood vessels, organs, or muscles. Difficulty passing urine. Nerve damage. This is usually temporary. What happens before the procedure? Medicines Ask your health care provider about: Changing or stopping your regular medicines. This is especially important if you are taking diabetes medicines or blood thinners. Taking medicines such as aspirin and ibuprofen. These medicines can thin your blood. Do not take these medicines unless your health care provider tells you to take them. Taking over-the-counter medicines, vitamins, herbs, and supplements. General  instructions Follow instructions from your health care provider about eating and drinking. In most instances, you will not need to stop eating and drinking completely before the procedure. You will be given an enema. During an enema, a liquid is injected into your rectum to clear out waste. You may have a blood or urine sample taken. Ask your health care provider what steps will be taken to help prevent infection. These steps may include: Washing skin with a germ-killing soap. Taking antibiotic medicine. If you will be going home right after the procedure, plan to have a responsible adult: Take you home from the hospital or clinic. You will not be allowed to drive. Care for you for the time you are told. What happens during the procedure?  An IV will be inserted into one of your veins. You will be given one or both of the following: A medicine to help you relax (sedative). A medicine to numb the area (local anesthetic). You will be placed on your left side, and your knees will be bent toward your chest. A probe with lubricated gel will be placed into your rectum, and images will be taken of your prostate and surrounding structures. Numbing medicine will be injected into your prostate. A biopsy needle will be inserted through your rectum or perineum and guided to your prostate using the ultrasound images. Prostate tissue samples will be removed, and the needle and probe will then be removed. The biopsy samples will be sent to a lab to be tested. The procedure may vary among health care providers and hospitals. What happens after the procedure?  Your blood pressure, heart rate, breathing rate, and blood oxygen level will be monitored until you leave the hospital or clinic. You may have some discomfort in the rectal area. You will be given pain medicine as needed. If you were given a sedative during the procedure, it can affect you for several hours. Do not drive or operate machinery until your  health care provider says that it is safe. It is up to you to get the results of your procedure. Ask your health care provider, or the department that is doing the procedure, when your results will be ready. Keep all follow-up visits. This is important. Summary A transrectal ultrasound-guided biopsy removes samples of tissue from your prostate using ultrasound-guided sound waves to help guide the process. This procedure is usually done to evaluate the prostate gland of men who have raised (elevated) levels of prostate-specific antigen (PSA), which can be a sign of prostate cancer or prostate enlargement related to aging. After your procedure, you may feel some discomfort in the rectal area. Plan to have a responsible adult take you home from the hospital or clinic, and follow up with your health care provider for your results. This information is not intended to replace advice given to you by your health care provider. Make sure you discuss any questions you have with your health care provider. Document Revised: 04/05/2021 Document Reviewed: 04/05/2021 Elsevier Patient Education  .

## 2021-09-23 ENCOUNTER — Encounter: Payer: Self-pay | Admitting: Urology

## 2021-09-23 ENCOUNTER — Ambulatory Visit (INDEPENDENT_AMBULATORY_CARE_PROVIDER_SITE_OTHER): Payer: Medicare Other | Admitting: Urology

## 2021-09-23 ENCOUNTER — Other Ambulatory Visit: Payer: Self-pay

## 2021-09-23 VITALS — BP 140/80 | HR 112 | Ht 63.0 in | Wt 153.8 lb

## 2021-09-23 DIAGNOSIS — R972 Elevated prostate specific antigen [PSA]: Secondary | ICD-10-CM | POA: Diagnosis not present

## 2021-09-23 DIAGNOSIS — C61 Malignant neoplasm of prostate: Secondary | ICD-10-CM | POA: Diagnosis not present

## 2021-09-23 MED ORDER — GENTAMICIN SULFATE 40 MG/ML IJ SOLN
80.0000 mg | Freq: Once | INTRAMUSCULAR | Status: AC
  Administered 2021-09-23: 80 mg via INTRAMUSCULAR

## 2021-09-23 MED ORDER — LEVOFLOXACIN 500 MG PO TABS
500.0000 mg | ORAL_TABLET | Freq: Once | ORAL | Status: AC
  Administered 2021-09-23: 500 mg via ORAL

## 2021-09-23 NOTE — Addendum Note (Signed)
Addended by: Gordy Clement C on: 09/23/2021 11:59 AM   Modules accepted: Orders

## 2021-09-23 NOTE — Progress Notes (Signed)
   09/23/21  Indication: Elevated PSA, 34  Prostate Biopsy Procedure   Informed consent was obtained, and we discussed the risks of bleeding and infection/sepsis. A time out was performed to ensure correct patient identity.  Pre-Procedure: - Last PSA Level: 34 - Gentamicin and levaquin given for antibiotic prophylaxis - Transrectal Ultrasound performed revealing a 23 gm prostate -Irregular hypoechoic area at right base - No median lobe  Procedure: - Prostate block performed using 10 cc 1% lidocaine and biopsies taken from sextant areas, a total of 12 under ultrasound guidance.  Post-Procedure: - Patient tolerated the procedure well - He was counseled to seek immediate medical attention if experiences significant bleeding, fevers, or severe pain - Return in one week to discuss biopsy results  Assessment/ Plan: Will follow up in 1-2 weeks to discuss pathology.  High suspicion for prostate cancer, likely will require staging imaging  Nickolas Madrid, MD 09/23/2021

## 2021-09-23 NOTE — Patient Instructions (Signed)

## 2021-09-24 LAB — SURGICAL PATHOLOGY

## 2021-09-30 ENCOUNTER — Ambulatory Visit: Payer: Medicare Other | Admitting: Urology

## 2021-10-05 ENCOUNTER — Other Ambulatory Visit: Payer: Self-pay | Admitting: Urology

## 2021-10-05 ENCOUNTER — Other Ambulatory Visit: Payer: Self-pay

## 2021-10-05 ENCOUNTER — Encounter: Payer: Self-pay | Admitting: Urology

## 2021-10-05 ENCOUNTER — Ambulatory Visit (INDEPENDENT_AMBULATORY_CARE_PROVIDER_SITE_OTHER): Payer: Medicare Other | Admitting: Urology

## 2021-10-05 VITALS — BP 146/67 | HR 111 | Ht 64.0 in | Wt 157.0 lb

## 2021-10-05 DIAGNOSIS — C61 Malignant neoplasm of prostate: Secondary | ICD-10-CM

## 2021-10-05 NOTE — Patient Instructions (Signed)
Prostate Cancer °The prostate is a small gland that helps make semen. It is located below a man's bladder, in front of the rectum. Prostate cancer is when abnormal cells grow in this gland. °What are the causes? °The cause of this condition is not known. °What increases the risk? °Being age 75 or older. °Having a family history of prostate cancer. °Having a family history of cancer of the breasts or ovaries. °Having genes that are passed from parent to child (inherited). °Having Lynch syndrome. °African American men and men of African descent are diagnosed with prostate cancer at higher rates than other men. °What are the signs or symptoms? °Problems peeing (urinating). This may include: °A stream that is weak, or pee that stops and starts. °Trouble starting or stopping your pee. °Trouble emptying all of your pee. °Needing to pee more often, especially at night. °Blood in your pee or semen. °Pain in the: °Lower back. °Lower belly (abdomen). °Hips. °Trouble getting an erection. °Weakness or numbness in the legs or feet. °How is this treated? °Treatment for this condition depends on: °How much the cancer has spread. °Your age. °The kind of treatment you want. °Your health. °Treatments include: °Being watched. This is called observation. You will be tested from time to time, but you will not get treated. Tests are to make sure that the cancer is not growing. °Surgery. This may be done to: °Take out (remove) the prostate. °Freeze and kill cancer cells. °Radiation. This uses a strong beam of energy to kill cancer cells. °Chemotherapy. This uses medicines that stop cancer cells from increasing. This kills cancer cells and healthy cells. °Targeted therapy. This kills cancer cells only. Healthy cells are not affected. °Hormone treatment. This stops the body from making hormones that help the cancer cells grow. °Follow these instructions at home: °Lifestyle °Do not smoke or use any products that contain nicotine or tobacco.  If you need help quitting, ask your doctor. °Eat a healthy diet. °Treatment may affect your ability to have sex. If you have a partner, touch, hold, hug, and caress your partner to have intimate moments. °Get plenty of sleep. °Ask your doctor for help to find a support group for men with prostate cancer. °General instructions °Take over-the-counter and prescription medicines only as told by your doctor. °If you have to go to the hospital, let your cancer doctor (oncologist) know. °Keep all follow-up visits. °Where to find more information °American Cancer Society: www.cancer.org °American Society of Clinical Oncology: www.cancer.net °National Cancer Institute: www.cancer.gov °Contact a doctor if: °You have new or more trouble peeing. °You have new or more blood in your pee. °You have new or more pain in your hips, back, or chest. °Get help right away if: °You have weakness in your legs. °You lose feeling in your legs. °You cannot control your pee or your poop (stool). °You have chills or a fever. °Summary °The prostate is a male gland that helps make semen. °Prostate cancer is when abnormal cells grow in this gland. °Treatment includes doing surgery, using medicines, using strong beams of energy, or watching without treatment. °Ask your doctor for help to find a support group for men with prostate cancer. °Contact a doctor if you have problems peeing or have any new pain that you did not have before. °This information is not intended to replace advice given to you by your health care provider. Make sure you discuss any questions you have with your health care provider. °Document Revised: 01/06/2021 Document Reviewed: 01/06/2021 °Elsevier   Patient Education © 2022 [employer]vier Inc. ° °

## 2021-10-05 NOTE — Progress Notes (Signed)
° °  10/05/2021 11:55 AM   Theotis Barrio 17-Oct-1946 [id number]  Reason for visit: Follow up prostate biopsy results  HPI: Very comorbid 75 year old male with medical history notable for COPD on oxygen as well as CAD status post CABG in 2018 who presented with an elevated PSA of 34.  This increased from prior value of 26 in April 2022.  He is on Flomax for weak urinary stream at baseline.  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  We had a lengthy conversation today about the patient's new diagnosis of prostate cancer.  We reviewed the risk classifications per the AUA guidelines including very low risk, low risk, intermediate risk, and high risk disease, and the need for additional staging imaging with CT and bone scan in patients with unfavorable intermediate risk and high risk disease.  I explained that his life expectancy, clinical stage, Gleason score, PSA, and other co-morbidities influence treatment strategies.  We discussed the roles of active surveillance, radiation therapy, surgical therapy with robotic prostatectomy, and hormone therapy with androgen deprivation.  We discussed that patients urinary symptoms also impact treatment strategy, as patients with severe lower urinary tract symptoms may have significant worsening or even develop urinary retention after undergoing radiation.  We discussed that with his age, COPD, and CAD he would not be a good candidate for surgery, and if he was found to have localized disease he would be a better candidate for radiation and ADT.  In summary, Terreon Ekholm is a 75 y.o. man with newly diagnosed high risk prostate cancer.  Staging imaging with CT and bone scan ordered, and will call with results.  We  discussed possible need for referral to radiation oncology or oncology pending imaging results, the treatment differences between localized and metastatic prostate cancer   I spent 40 total minutes on the day of the encounter including pre-visit review of the medical record, face-to-face time with the patient, and post visit ordering of labs/imaging/tests.  Billey Co, [facility] [address] [place], [place] [address] [phone]

## 2021-10-11 ENCOUNTER — Ambulatory Visit: Payer: Medicare Other

## 2021-10-11 ENCOUNTER — Other Ambulatory Visit: Payer: Medicare Other

## 2021-10-19 ENCOUNTER — Encounter
Admission: RE | Admit: 2021-10-19 | Discharge: 2021-10-19 | Disposition: A | Discharge status: Home / Self Care | Payer: Medicare Other | Source: Ambulatory Visit | Attending: Urology | Admitting: Urology

## 2021-10-19 DIAGNOSIS — C61 Malignant neoplasm of prostate: Secondary | ICD-10-CM | POA: Insufficient documentation

## 2021-10-19 MED ORDER — TECHNETIUM TC 99M MEDRONATE IV KIT
20.0000 | PACK | Freq: Once | INTRAVENOUS | Status: AC | PRN
  Administered 2021-10-19: 11:00:00 19.72 via INTRAVENOUS

## 2021-11-04 ENCOUNTER — Other Ambulatory Visit: Payer: Self-pay

## 2021-11-04 ENCOUNTER — Ambulatory Visit
Admission: RE | Admit: 2021-11-04 | Discharge: 2021-11-04 | Disposition: A | Discharge status: Home / Self Care | Payer: Medicare Other | Source: Ambulatory Visit | Attending: Urology | Admitting: Urology

## 2021-11-04 DIAGNOSIS — C61 Malignant neoplasm of prostate: Secondary | ICD-10-CM | POA: Diagnosis present

## 2021-11-04 LAB — POCT I-STAT CREATININE: Creatinine, Ser: 1.2 mg/dL (ref 0.61–1.24)

## 2021-11-04 MED ORDER — IOHEXOL 300 MG/ML  SOLN
100.0000 mL | Freq: Once | INTRAMUSCULAR | Status: AC | PRN
  Administered 2021-11-04: 100 mL via INTRAVENOUS

## 2021-11-05 ENCOUNTER — Other Ambulatory Visit: Payer: Self-pay | Admitting: Urology

## 2021-11-05 DIAGNOSIS — C61 Malignant neoplasm of prostate: Secondary | ICD-10-CM

## 2021-11-08 ENCOUNTER — Telehealth: Payer: Self-pay

## 2021-11-08 NOTE — Telephone Encounter (Signed)
Called pt informed him of the information below. Pt voiced understanding and states he has already been contacted by radiation oncology and is scheduled for 11/17/21.

## 2021-11-08 NOTE — Telephone Encounter (Signed)
-----   Message from Billey Co, MD sent at 11/05/2021  3:48 PM EST ----- Good news, no evidence of metastatic prostate cancer outside of the prostate on CT and bone scan.  I will place referral to radiation oncology to discuss treatment options, and they will reach out to him to schedule.  They are usually very good about getting people in quickly within 1 to 2 weeks  Nickolas Madrid, MD 11/05/2021

## 2021-11-11 ENCOUNTER — Encounter: Payer: Self-pay | Admitting: *Deleted

## 2021-11-17 ENCOUNTER — Encounter: Payer: Self-pay | Admitting: Radiation Oncology

## 2021-11-17 ENCOUNTER — Ambulatory Visit
Admission: RE | Admit: 2021-11-17 | Discharge: 2021-11-17 | Disposition: A | Discharge status: Home / Self Care | Payer: Medicare Other | Source: Ambulatory Visit | Attending: Radiation Oncology | Admitting: Radiation Oncology

## 2021-11-17 ENCOUNTER — Other Ambulatory Visit: Payer: Self-pay

## 2021-11-17 VITALS — BP 137/79 | HR 109 | Temp 96.1°F | Resp 16 | Wt 155.6 lb

## 2021-11-17 DIAGNOSIS — J449 Chronic obstructive pulmonary disease, unspecified: Secondary | ICD-10-CM | POA: Insufficient documentation

## 2021-11-17 DIAGNOSIS — Z87891 Personal history of nicotine dependence: Secondary | ICD-10-CM | POA: Insufficient documentation

## 2021-11-17 DIAGNOSIS — Z7982 Long term (current) use of aspirin: Secondary | ICD-10-CM | POA: Insufficient documentation

## 2021-11-17 DIAGNOSIS — I255 Ischemic cardiomyopathy: Secondary | ICD-10-CM | POA: Diagnosis not present

## 2021-11-17 DIAGNOSIS — R351 Nocturia: Secondary | ICD-10-CM | POA: Insufficient documentation

## 2021-11-17 DIAGNOSIS — E785 Hyperlipidemia, unspecified: Secondary | ICD-10-CM | POA: Diagnosis not present

## 2021-11-17 DIAGNOSIS — Z79899 Other long term (current) drug therapy: Secondary | ICD-10-CM | POA: Diagnosis not present

## 2021-11-17 DIAGNOSIS — Z923 Personal history of irradiation: Secondary | ICD-10-CM | POA: Insufficient documentation

## 2021-11-17 DIAGNOSIS — I2510 Atherosclerotic heart disease of native coronary artery without angina pectoris: Secondary | ICD-10-CM | POA: Insufficient documentation

## 2021-11-17 DIAGNOSIS — C61 Malignant neoplasm of prostate: Secondary | ICD-10-CM | POA: Insufficient documentation

## 2021-11-17 NOTE — Consult Note (Signed)
NEW PATIENT EVALUATION  Name: Thomas Trujillo  MRN: [id number]  Date:   11/17/2021     DOB: January 10, 1946   This 76 y.o. 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  REFERRING PHYSICIAN: [name], MD  CHIEF COMPLAINT:  Chief Complaint  Patient presents with   Prostate Cancer    Initial consultation    DIAGNOSIS: The encounter diagnosis was Prostate cancer [facility]).   PREVIOUS INVESTIGATIONS:  Bone scan CT scans reviewed Pathology report reviewed Clinical notes reviewed  HPI: Patient is a 76 year old male who presented in April 2022 with a PSA of 26.  This climbed to 79 recently.  He was seen by urology underwent transrectal ultrasound-guided biopsy showing 5 of 12 cores positive for Gleason 7 (3+4) acinar adenocarcinoma with cribriform pattern.  His prostate was approximately 23 g.  Digital rectal exam showed a firm nodular prostate on the right side.  Patient is fairly asymptomatic has very little nocturia urgency or frequency.  No abnormal bowel pattern.  He had a CT scan showing 3 nodules in the left lower lobe largest measuring 9 mm.  Future surveillance warranted.  He had no evidence of metastatic disease in the abdomen or pelvis.  Moderately enlarged prostate gland protruding to the base of the urinary bladder.  Bone scan showed no evidence of metastatic disease.  He is now referred to ration [facility] for opinion.  PLANNED TREATMENT REGIMEN: IMRT radiation therapy to prostate and pelvic nodes plus ADT therapy  PAST MEDICAL HISTORY:  has a past medical history of Bleb, lung ([place]), COPD (chronic obstructive pulmonary disease) ([place]), Coronary artery disease (02/14/2017), Hyperlipidemia, Ischemic cardiomyopathy, Lung nodule, Pneumothorax (05/2017), and Prostate cancer ([place]).    PAST SURGICAL HISTORY:  Past Surgical History:  Procedure Laterality Date    CATARACT EXTRACTION, BILATERAL     CORONARY ARTERY BYPASS GRAFT N/A 02/14/2017   Procedure: CORONARY ARTERY BYPASS GRAFTING (CABG) x 2 , using left internal mammary artery and right leg greater saphenous vein harvested endoscopically LIMA-LAD, SVG-RAMUS;  Surgeon: [name], MD;  Location: [place];  Service: Open Heart Surgery;  Laterality: N/A;   ENDOVEIN HARVEST OF GREATER SAPHENOUS VEIN Right 02/14/2017   Procedure: ENDOVEIN HARVEST OF RIGHT THIGH GREATER SAPHENOUS VEIN;  Surgeon: [name], MD;  Location: [place];  Service: Open Heart Surgery;  Laterality: Right;   LEFT HEART CATH AND CORONARY ANGIOGRAPHY N/A 02/14/2017   Procedure: Left Heart Cath and Coronary Angiography;  Surgeon: [name], MD;  Location: [place] [facility];  Service: Cardiovascular;  Laterality: N/A;   NASAL POLYP SURGERY     STAPLING OF BLEBS N/A 02/14/2017   Procedure: STAPLING OF LARGE LEFT UPPER LOBE PULMONARY BLEB;  Surgeon: [name], MD;  Location: [place];  Service: Open Heart Surgery;  Laterality: N/A;   TEE WITHOUT CARDIOVERSION N/A 02/14/2017   Procedure: TRANSESOPHAGEAL ECHOCARDIOGRAM (TEE);  Surgeon: [name], MD;  Location: [place];  Service: Open Heart Surgery;  Laterality: N/A;   VIDEO ASSISTED THORACOSCOPY (VATS) W/TALC PLEUADESIS N/A 11/23/2017   Procedure: VIDEO ASSISTED THORACOSCOPY (VATS) W/TALC PLEUADESIS;  Surgeon: Nestor Lewandowsky, MD;  Location: [facility];  Service: Thoracic;  Laterality: N/A;    FAMILY HISTORY: family history includes Obesity in his son.  SOCIAL HISTORY:  reports that he quit smoking about 14 years ago. His smoking use included cigarettes. He has never  used smokeless tobacco. He reports that he does not drink alcohol and does not use drugs.  ALLERGIES: No known allergies  MEDICATIONS:  Current Outpatient Medications  Medication Sig Dispense Refill   albuterol (VENTOLIN HFA) 108 (90 Base) MCG/ACT inhaler Inhale 2 puffs into the lungs every 6  (six) hours as needed for wheezing or shortness of breath.     aspirin EC 81 MG tablet Take 1 tablet (81 mg total) by mouth daily. 90 tablet 3   atorvastatin (LIPITOR) 40 MG tablet Take 40 mg by mouth daily.     budesonide (PULMICORT) 0.5 MG/2ML nebulizer solution Inhale into the lungs.     ferrous sulfate 325 (65 FE) MG tablet Take 325 mg by mouth daily with breakfast.     furosemide (LASIX) 20 MG tablet Take 1-2 tabs as directed once daily with additional dose if gains 2 lbs in a day or 5lbs in a week. PLEASE CALL [phone] TO SCHEDULE APPOINTMENT FOR FURTHER REFILLS. THANK YOU! 180 tablet 0   ipratropium-albuterol (DUONEB) 0.5-2.5 (3) MG/3ML SOLN Take 3 mLs by nebulization every 6 (six) hours as needed. When awake      montelukast (SINGULAIR) 10 MG tablet Take 10 mg by mouth at bedtime.     Multiple Vitamins-Minerals (MULTIVITAMIN ADULT PO) Take 1 tablet by mouth daily.     OXYGEN Inhale into the lungs. By nasal cannula and wean as tolerated to keep O2 sats >90%     potassium chloride (K-DUR) 10 MEQ tablet Take 10 mEq by mouth daily.     tamsulosin (FLOMAX) 0.4 MG CAPS capsule Take 1 capsule (0.4 mg total) by mouth daily. 30 capsule 1   No current facility-administered medications for this encounter.    ECOG PERFORMANCE STATUS:  0 - Asymptomatic  REVIEW OF SYSTEMS: Patient is status post CABG in 2018 and is on nasal oxygen. Patient denies any weight loss, fatigue, weakness, fever, chills or night sweats. Patient denies any loss of vision, blurred vision. Patient denies any ringing  of the ears or hearing loss. No irregular heartbeat. Patient denies heart murmur or history of fainting. Patient denies any chest pain or pain radiating to her upper extremities. Patient denies any shortness of breath, difficulty breathing at night, cough or hemoptysis. Patient denies any swelling in the lower legs. Patient denies any nausea vomiting, vomiting of blood, or coffee ground material in the vomitus.  Patient denies any stomach pain. Patient states has had normal bowel movements no significant constipation or diarrhea. Patient denies any dysuria, hematuria or significant nocturia. Patient denies any problems walking, swelling in the joints or loss of balance. Patient denies any skin changes, loss of hair or loss of weight. Patient denies any excessive worrying or anxiety or significant depression. Patient denies any problems with insomnia. Patient denies excessive thirst, polyuria, polydipsia. Patient denies any swollen glands, patient denies easy bruising or easy bleeding. Patient denies any recent infections, allergies or URI. Patient "s visual fields have not changed significantly in recent time.   PHYSICAL EXAM: BP 137/79 (BP Location: Left Arm, Patient Position: Sitting)    Pulse (!) 109    Temp (!) 96.1 F (35.6 C) (Tympanic)    Resp 16    Wt 155 lb 9.6 oz (70.6 kg)    BMI 26.71 kg/m  Patient is on nasal oxygen.  Well-developed well-nourished patient in NAD. HEENT reveals PERLA, EOMI, discs not visualized.  Oral cavity is clear. No oral mucosal lesions are identified. Neck is clear without evidence of  cervical or supraclavicular adenopathy. Lungs are clear to A&P. Cardiac examination is essentially unremarkable with regular rate and rhythm without murmur rub or thrill. Abdomen is benign with no organomegaly or masses noted. Motor sensory and DTR levels are equal and symmetric in the upper and lower extremities. Cranial nerves II through XII are grossly intact. Proprioception is intact. No peripheral adenopathy or edema is identified. No motor or sensory levels are noted. Crude visual fields are within normal range.  LABORATORY DATA: Pathology report reviewed    RADIOLOGY RESULTS: Bone scan and CT scan abdomen pelvis reviewed compatible with above-stated findings   IMPRESSION: Stage IIIa Gleason 7 (3+4) adenocarcinoma the prostate presenting with a PSA of 93 in 76 year old male  PLAN: This  time patient is set moderately high risk stage IIIa adenocarcinoma the prostate.  I have recommended image guided IMRT radiation therapy to his prostate and pelvic nodes.  According to [facility] he has a 24% chance of lymph node involvement.  I would plan on delivering 50 Pearline Cables to his prostate treating his pelvic nodes to 72 Gray using IMRT treatment planning and delivery.  I have asked urology to place fiducial markers for daily image guided treatment.  I would also like the patient started on ADT therapy.  Risks and benefits of treatment clued increased lower urinary tract symptoms diarrhea fatigue alteration of blood counts all were discussed in detail with the patient.  He seems to comprehend our treatment plan well.  I would like to take this opportunity to thank you for allowing me to participate in the care of your patient.Noreene Filbert, MD

## 2021-11-18 ENCOUNTER — Telehealth: Payer: Self-pay

## 2021-11-18 NOTE — Telephone Encounter (Signed)
Benefits investigation form faxed for Eligard.  

## 2021-11-18 NOTE — Telephone Encounter (Signed)
-----   Message from Daiva Huge, RN sent at 11/17/2021 11:46 AM EST ----- Regarding: Markers/eligard Good morning,   Thomas Trujillo will need Gold seed markers and Eligard with Dr. Diamantina Providence.  He has the markers with him.  Just let me know when you get him scheduled.  Thanks,   [facility]

## 2021-11-26 NOTE — Telephone Encounter (Signed)
Incoming benefits, No PA required for Eligard.

## 2021-12-16 ENCOUNTER — Ambulatory Visit (INDEPENDENT_AMBULATORY_CARE_PROVIDER_SITE_OTHER): Payer: Medicare Other | Admitting: Urology

## 2021-12-16 ENCOUNTER — Encounter: Payer: Self-pay | Admitting: Urology

## 2021-12-16 ENCOUNTER — Other Ambulatory Visit: Payer: Self-pay

## 2021-12-16 VITALS — BP 139/74 | HR 114 | Ht 64.0 in | Wt 150.0 lb

## 2021-12-16 DIAGNOSIS — C61 Malignant neoplasm of prostate: Secondary | ICD-10-CM | POA: Diagnosis not present

## 2021-12-16 MED ORDER — GENTAMICIN SULFATE 40 MG/ML IJ SOLN
80.0000 mg | Freq: Once | INTRAMUSCULAR | Status: AC
  Administered 2021-12-16: 80 mg via INTRAMUSCULAR

## 2021-12-16 MED ORDER — LEVOFLOXACIN 500 MG PO TABS
500.0000 mg | ORAL_TABLET | Freq: Once | ORAL | Status: AC
  Administered 2021-12-16: 500 mg via ORAL

## 2021-12-16 MED ORDER — LEUPROLIDE ACETATE (6 MONTH) 45 MG ~~LOC~~ KIT
45.0000 mg | PACK | Freq: Once | SUBCUTANEOUS | Status: AC
  Administered 2021-12-16: 45 mg via SUBCUTANEOUS

## 2021-12-16 NOTE — Progress Notes (Signed)
Eligard SubQ Injection   Due to Prostate Cancer patient is present today for a Eligard Injection.  Medication: Eligard 6 month Dose: 45 mg  Location: right arm Lot: [id number] Exp: 04/2023  Patient tolerated well, no complications were noted.  Performed by: Gordy Clement, [place]   Per Dr. Diamantina Providence patient is to continue therapy for 2 years. Patient's next follow up was scheduled for August 2023. This appointment was scheduled using wheel and given to patient today along with reminder continue on Vitamin D 800-1000iu and Calcium 1000-1200mg  daily while on Androgen Deprivation Therapy.  PA approval dates: No PA Required.

## 2021-12-16 NOTE — Patient Instructions (Signed)
Leuprolide depot injection What is this medication? LEUPROLIDE (loo PROE lide) is a man-made protein that acts like a natural hormone in the body. It decreases testosterone in men and decreases estrogen in women. In men, this medicine is used to treat advanced prostate cancer. In women, some forms of this medicine may be used to treat endometriosis, uterine fibroids, or other male hormone-related problems. This medicine may be used for other purposes; ask your health care provider or pharmacist if you have questions. COMMON BRAND NAME(S): Eligard, Fensolv, Lupron Depot, Lupron Depot-Ped, Viadur What should I tell my care team before I take this medication? They need to know if you have any of these conditions: diabetes heart disease or previous heart attack high blood pressure high cholesterol mental illness osteoporosis pain or difficulty passing urine seizures spinal cord metastasis stroke suicidal thoughts, plans, or attempt; a previous suicide attempt by you or a family member tobacco smoker unusual vaginal bleeding (women) an unusual or allergic reaction to leuprolide, benzyl alcohol, other medicines, foods, dyes, or preservatives pregnant or trying to get pregnant breast-feeding How should I use this medication? This medicine is for injection into a muscle or for injection under the skin. It is given by a health care professional in a hospital or clinic setting. The specific product will determine how it will be given to you. Make sure you understand which product you receive and how often you will receive it. Talk to your pediatrician regarding the use of this medicine in children. Special care may be needed. Overdosage: If you think you have taken too much of this medicine contact a poison control center or emergency room at once. NOTE: This medicine is only for you. Do not share this medicine with others. What if I miss a dose? It is important not to miss a dose. Call your  doctor or health care professional if you are unable to keep an appointment. Depot injections: Depot injections are given either once-monthly, every 12 weeks, every 16 weeks, or every 24 weeks depending on the product you are prescribed. The product you are prescribed will be based on if you are male or male, and your condition. Make sure you understand your product and dosing. What may interact with this medication? Do not take this medicine with any of the following medications: chasteberry cisapride dronedarone pimozide thioridazine This medicine may also interact with the following medications: herbal or dietary supplements, like black cohosh or DHEA male hormones, like estrogens or progestins and birth control pills, patches, rings, or injections male hormones, like testosterone other medicines that prolong the QT interval (abnormal heart rhythm) This list may not describe all possible interactions. Give your health care provider a list of all the medicines, herbs, non-prescription drugs, or dietary supplements you use. Also tell them if you smoke, drink alcohol, or use illegal drugs. Some items may interact with your medicine. What should I watch for while using this medication? Visit your doctor or health care professional for regular checks on your progress. During the first weeks of treatment, your symptoms may get worse, but then will improve as you continue your treatment. You may get hot flashes, increased bone pain, increased difficulty passing urine, or an aggravation of nerve symptoms. Discuss these effects with your doctor or health care professional, some of them may improve with continued use of this medicine. Male patients may experience a menstrual cycle or spotting during the first months of therapy with this medicine. If this continues, contact your doctor or  health care professional. This medicine may increase blood sugar. Ask your healthcare provider if changes in diet  or medicines are needed if you have diabetes. What side effects may I notice from receiving this medication? Side effects that you should report to your doctor or health care professional as soon as possible: allergic reactions like skin rash, itching or hives, swelling of the face, lips, or tongue breathing problems chest pain depression or memory disorders pain in your legs or groin pain at site where injected or implanted seizures severe headache signs and symptoms of high blood sugar such as being more thirsty or hungry or having to urinate more than normal. You may also feel very tired or have blurry vision swelling of the feet and legs suicidal thoughts or other mood changes visual changes vomiting Side effects that usually do not require medical attention (report to your doctor or health care professional if they continue or are bothersome): breast swelling or tenderness decrease in sex drive or performance diarrhea hot flashes loss of appetite muscle, joint, or bone pains nausea redness or irritation at site where injected or implanted skin problems or acne This list may not describe all possible side effects. Call your doctor for medical advice about side effects. You may report side effects to FDA at 1-800-FDA-1088. Where should I keep my medication? This drug is given in a hospital or clinic and will not be stored at home. NOTE: This sheet is a summary. It may not cover all possible information. If you have questions about this medicine, talk to your doctor, pharmacist, or health care provider.  2022 Elsevier/Gold Standard (2021-06-29 00:00:00)

## 2021-12-16 NOTE — Progress Notes (Signed)
° °  12/16/21  Indication: High risk prostate cancer  Prostate gold seed marker procedure   Informed consent was obtained, and we discussed the risks of bleeding and infection/sepsis. A time out was performed to ensure correct patient identity.  Pre-Procedure: - Gentamicin and levaquin given for antibiotic prophylaxis  Procedure: -A total of 3 gold seed markers were placed in the prostate, 2 at the bilateral bases, and one at the apex  Post-Procedure: - Patient tolerated the procedure well - He was counseled to seek immediate medical attention if experiences significant bleeding, fevers, or severe pain   Assessment/ Plan: Follow-up with radiation oncology for XRT ADT injection given today(71mo), 2 years total  RTC 6 months to continue ADT and check PSA  Nickolas Madrid, MD 12/16/2021

## 2021-12-20 ENCOUNTER — Ambulatory Visit
Admission: RE | Admit: 2021-12-20 | Discharge: 2021-12-20 | Disposition: A | Discharge status: Home / Self Care | Payer: Medicare Other | Source: Ambulatory Visit | Attending: Radiation Oncology | Admitting: Radiation Oncology

## 2021-12-20 DIAGNOSIS — C61 Malignant neoplasm of prostate: Secondary | ICD-10-CM | POA: Insufficient documentation

## 2021-12-24 ENCOUNTER — Other Ambulatory Visit: Payer: Self-pay | Admitting: *Deleted

## 2021-12-24 DIAGNOSIS — C61 Malignant neoplasm of prostate: Secondary | ICD-10-CM | POA: Insufficient documentation

## 2021-12-28 ENCOUNTER — Other Ambulatory Visit: Payer: Self-pay | Admitting: *Deleted

## 2021-12-28 ENCOUNTER — Ambulatory Visit: Admission: RE | Admit: 2021-12-28 | Payer: Medicare Other | Source: Ambulatory Visit

## 2021-12-29 ENCOUNTER — Ambulatory Visit: Payer: Medicare Other

## 2021-12-30 ENCOUNTER — Emergency Department: Payer: Medicare Other

## 2021-12-30 ENCOUNTER — Inpatient Hospital Stay
Admission: EM | Admit: 2021-12-30 | Discharge: 2022-01-22 | DRG: 871 | Disposition: E | Payer: Medicare Other | Attending: Internal Medicine | Admitting: Internal Medicine

## 2021-12-30 ENCOUNTER — Other Ambulatory Visit: Payer: Self-pay

## 2021-12-30 ENCOUNTER — Ambulatory Visit: Payer: Medicare Other

## 2021-12-30 DIAGNOSIS — Z20822 Contact with and (suspected) exposure to covid-19: Secondary | ICD-10-CM | POA: Diagnosis present

## 2021-12-30 DIAGNOSIS — J189 Pneumonia, unspecified organism: Secondary | ICD-10-CM | POA: Diagnosis not present

## 2021-12-30 DIAGNOSIS — A419 Sepsis, unspecified organism: Secondary | ICD-10-CM | POA: Diagnosis not present

## 2021-12-30 DIAGNOSIS — R0602 Shortness of breath: Principal | ICD-10-CM

## 2021-12-30 DIAGNOSIS — R251 Tremor, unspecified: Secondary | ICD-10-CM | POA: Diagnosis present

## 2021-12-30 DIAGNOSIS — Z515 Encounter for palliative care: Secondary | ICD-10-CM

## 2021-12-30 DIAGNOSIS — J432 Centrilobular emphysema: Secondary | ICD-10-CM | POA: Diagnosis present

## 2021-12-30 DIAGNOSIS — I2510 Atherosclerotic heart disease of native coronary artery without angina pectoris: Secondary | ICD-10-CM

## 2021-12-30 DIAGNOSIS — Z951 Presence of aortocoronary bypass graft: Secondary | ICD-10-CM

## 2021-12-30 DIAGNOSIS — I252 Old myocardial infarction: Secondary | ICD-10-CM

## 2021-12-30 DIAGNOSIS — Z87891 Personal history of nicotine dependence: Secondary | ICD-10-CM

## 2021-12-30 DIAGNOSIS — C61 Malignant neoplasm of prostate: Secondary | ICD-10-CM | POA: Diagnosis present

## 2021-12-30 DIAGNOSIS — J9621 Acute and chronic respiratory failure with hypoxia: Secondary | ICD-10-CM

## 2021-12-30 DIAGNOSIS — Z7951 Long term (current) use of inhaled steroids: Secondary | ICD-10-CM

## 2021-12-30 DIAGNOSIS — R6520 Severe sepsis without septic shock: Secondary | ICD-10-CM | POA: Diagnosis present

## 2021-12-30 DIAGNOSIS — I255 Ischemic cardiomyopathy: Secondary | ICD-10-CM | POA: Diagnosis present

## 2021-12-30 DIAGNOSIS — N400 Enlarged prostate without lower urinary tract symptoms: Secondary | ICD-10-CM | POA: Diagnosis present

## 2021-12-30 DIAGNOSIS — I248 Other forms of acute ischemic heart disease: Secondary | ICD-10-CM | POA: Diagnosis present

## 2021-12-30 DIAGNOSIS — Z9981 Dependence on supplemental oxygen: Secondary | ICD-10-CM

## 2021-12-30 DIAGNOSIS — E785 Hyperlipidemia, unspecified: Secondary | ICD-10-CM

## 2021-12-30 DIAGNOSIS — J449 Chronic obstructive pulmonary disease, unspecified: Secondary | ICD-10-CM

## 2021-12-30 DIAGNOSIS — J441 Chronic obstructive pulmonary disease with (acute) exacerbation: Secondary | ICD-10-CM | POA: Diagnosis present

## 2021-12-30 DIAGNOSIS — Z66 Do not resuscitate: Secondary | ICD-10-CM | POA: Diagnosis not present

## 2021-12-30 DIAGNOSIS — R0789 Other chest pain: Secondary | ICD-10-CM | POA: Diagnosis not present

## 2021-12-30 DIAGNOSIS — Z28310 Unvaccinated for covid-19: Secondary | ICD-10-CM

## 2021-12-30 DIAGNOSIS — R7881 Bacteremia: Secondary | ICD-10-CM | POA: Diagnosis present

## 2021-12-30 DIAGNOSIS — D849 Immunodeficiency, unspecified: Secondary | ICD-10-CM | POA: Diagnosis present

## 2021-12-30 DIAGNOSIS — R778 Other specified abnormalities of plasma proteins: Secondary | ICD-10-CM

## 2021-12-30 DIAGNOSIS — Z79899 Other long term (current) drug therapy: Secondary | ICD-10-CM

## 2021-12-30 DIAGNOSIS — J9611 Chronic respiratory failure with hypoxia: Secondary | ICD-10-CM

## 2021-12-30 DIAGNOSIS — Z7982 Long term (current) use of aspirin: Secondary | ICD-10-CM

## 2021-12-30 LAB — RESP PANEL BY RT-PCR (FLU A&B, COVID) ARPGX2
Influenza A by PCR: NEGATIVE
Influenza B by PCR: NEGATIVE
SARS Coronavirus 2 by RT PCR: NEGATIVE

## 2021-12-30 LAB — BASIC METABOLIC PANEL
Anion gap: 12 (ref 5–15)
BUN: 21 mg/dL (ref 8–23)
CO2: 24 mmol/L (ref 22–32)
Calcium: 8.8 mg/dL — ABNORMAL LOW (ref 8.9–10.3)
Chloride: 99 mmol/L (ref 98–111)
Creatinine, Ser: 1.28 mg/dL — ABNORMAL HIGH (ref 0.61–1.24)
GFR, Estimated: 58 mL/min — ABNORMAL LOW (ref >60)
Glucose, Bld: 145 mg/dL — ABNORMAL HIGH (ref 70–99)
Potassium: 3.5 mmol/L (ref 3.5–5.1)
Sodium: 135 mmol/L (ref 135–145)

## 2021-12-30 LAB — CBC WITH DIFFERENTIAL/PLATELET
Abs Immature Granulocytes: 0.07 10*3/uL (ref 0.00–0.07)
Basophils Absolute: 0 10*3/uL (ref 0.0–0.1)
Basophils Relative: 0 %
Eosinophils Absolute: 0.1 10*3/uL (ref 0.0–0.5)
Eosinophils Relative: 1 %
HCT: 40 % (ref 39.0–52.0)
Hemoglobin: 13.4 g/dL (ref 13.0–17.0)
Immature Granulocytes: 1 %
Lymphocytes Relative: 6 %
Lymphs Abs: 0.7 10*3/uL (ref 0.7–4.0)
MCH: 30.7 pg (ref 26.0–34.0)
MCHC: 33.5 g/dL (ref 30.0–36.0)
MCV: 91.7 fL (ref 80.0–100.0)
Monocytes Absolute: 1.5 10*3/uL — ABNORMAL HIGH (ref 0.1–1.0)
Monocytes Relative: 13 %
Neutro Abs: 9.3 10*3/uL — ABNORMAL HIGH (ref 1.7–7.7)
Neutrophils Relative %: 79 %
Platelets: 217 10*3/uL (ref 150–400)
RBC: 4.36 MIL/uL (ref 4.22–5.81)
RDW: 13.3 % (ref 11.5–15.5)
WBC: 11.6 10*3/uL — ABNORMAL HIGH (ref 4.0–10.5)
nRBC: 0 % (ref 0.0–0.2)

## 2021-12-30 LAB — TROPONIN I (HIGH SENSITIVITY)
Troponin I (High Sensitivity): 17 ng/L (ref <18)
Troponin I (High Sensitivity): 20 ng/L — ABNORMAL HIGH (ref <18)

## 2021-12-30 LAB — LACTIC ACID, PLASMA: Lactic Acid, Venous: 0.9 mmol/L (ref 0.5–1.9)

## 2021-12-30 MED ORDER — ONDANSETRON HCL 4 MG/2ML IJ SOLN: 4.0000 mg | Freq: Four times a day (QID) | INTRAMUSCULAR | Status: DC | PRN

## 2021-12-30 MED ORDER — ACETAMINOPHEN 500 MG PO TABS: 1000.0000 mg | ORAL_TABLET | Freq: Four times a day (QID) | ORAL | Status: AC | PRN

## 2021-12-30 MED ORDER — LACTATED RINGERS IV BOLUS
1000.0000 mL | Freq: Once | INTRAVENOUS | Status: AC
Start: 2021-12-30 — End: 2021-12-30
  Administered 2021-12-30: 20:00:00 1000 mL via INTRAVENOUS

## 2021-12-30 MED ORDER — SODIUM CHLORIDE 0.9 % IV SOLN
2.0000 g | INTRAVENOUS | Status: AC
  Administered 2021-12-31 – 2022-01-03 (×4): 2 g via INTRAVENOUS
  Filled 2021-12-30 (×3): qty 2
  Filled 2021-12-30: qty 20

## 2021-12-30 MED ORDER — SODIUM CHLORIDE 0.9 % IV SOLN
500.0000 mg | Freq: Once | INTRAVENOUS | Status: AC
  Administered 2021-12-30: 21:00:00 500 mg via INTRAVENOUS
  Filled 2021-12-30: qty 5

## 2021-12-30 MED ORDER — SODIUM CHLORIDE 0.9 % IV SOLN: 1.0000 g | Freq: Once | INTRAVENOUS | Status: DC

## 2021-12-30 MED ORDER — SODIUM CHLORIDE 0.9 % IV SOLN
500.0000 mg | INTRAVENOUS | Status: DC
  Filled 2021-12-30: qty 5

## 2021-12-30 MED ORDER — ENOXAPARIN SODIUM 40 MG/0.4ML IJ SOSY
40.0000 mg | PREFILLED_SYRINGE | INTRAMUSCULAR | Status: DC
  Administered 2021-12-30 – 2022-01-08 (×10): 40 mg via SUBCUTANEOUS
  Filled 2021-12-30 (×10): qty 0.4

## 2021-12-30 MED ORDER — SODIUM CHLORIDE 0.9 % IV SOLN
1.0000 g | Freq: Once | INTRAVENOUS | Status: AC
  Administered 2021-12-30: 20:00:00 1 g via INTRAVENOUS
  Filled 2021-12-30: qty 10

## 2021-12-30 MED ORDER — ATORVASTATIN CALCIUM 20 MG PO TABS
40.0000 mg | ORAL_TABLET | Freq: Every day | ORAL | Status: DC
  Administered 2021-12-31 – 2022-01-09 (×10): 40 mg via ORAL
  Filled 2021-12-30 (×10): qty 2

## 2021-12-30 MED ORDER — ACETAMINOPHEN 650 MG RE SUPP: 650.0000 mg | Freq: Four times a day (QID) | RECTAL | Status: AC | PRN

## 2021-12-30 MED ORDER — ONDANSETRON HCL 4 MG PO TABS: 4.0000 mg | ORAL_TABLET | Freq: Four times a day (QID) | ORAL | Status: DC | PRN

## 2021-12-30 NOTE — Hospital Course (Addendum)
Thomas Trujillo is a 76 year old male with history of BPH, hyperlipidemia, COPD, history of CAD, prostate cancer, history of NSTEMI, status post CABG x2, history of pneumothorax on the left, who presents emergency department for chief concerns of shortness of breath. ? ?Initial vitals in the emergency department was 99.8, respiration rate was elevated at 29, heart rate 125, blood pressure 148/64, SPO2 of 96% on 6 L nasal cannula. ? ?Serum sodium 135, potassium 3.5, chloride 99, bicarb 24, BUN 21, serum creatinine 1.28, nonfasting blood glucose 145, GFR 58, WBC 11.6, hemoglobin 13.4, platelets of 217. ? ?Portable chest x-ray in the ED was ordered by EDP and was read as dense perihilar right upper lobe consolidation, suspicious for pneumonia.  Small amount of adjacent fissural fluid.  Advanced emphysema. ? ?ED treatment: Ceftriaxone 1 g IV, azithromycin 500 mg IV, LR 1 L bolus. ?

## 2021-12-30 NOTE — ED Provider Notes (Signed)
? ?[facility] ?Provider Note ? ? ? Event Date/Time  ? First MD Initiated Contact with Patient 01/04/2022 1942   ?  (approximate) ? ? ?History  ? ?Shortness of Breath ? ? ?HPI ? ?Thomas Trujillo is a 76 y.o. male  who, per outpatient office note dated 10/05/21 has history of COPD on O2, CAD s/pl cabg, who presents to the emergency department today because of concern for shortness of breath. The patient states he started feeling bad 3 days ago.  It started with shivers.  The next day he started developing some shortness of breath.  He states that he went to a doctor's appointment and he was found to have a temperature of 101.  He tested negative for COVID 3 days ago.  The patient does wear oxygen at home at 2 L but states he had to bump it up to 4 L because of his shortness of breath.  Today start developing thick phlegm. ? ?Physical Exam  ? ?Triage Vital Signs: ?ED Triage Vitals  ?Enc Vitals Group  ?   BP 01/17/2022 1910 (!) 148/67  ?   Pulse Rate 01/19/2022 1910 (!) 125  ?   Resp 01/09/2022 1910 (!) 29  ?   Temp 01/13/2022 1910 99.8 ?F (37.7 ?C)  ?   Temp src --   ?   SpO2 12/22/2021 1910 96 %  ?   Weight 01/08/2022 1912 149 lb 14.6 oz (68 kg)  ?   Height 12/31/2021 1912 '5\' 4"'$  (1.626 m)  ?   Head Circumference --   ?   Peak Flow --   ?   Pain Score 01/15/2022 1910 0  ?   Pain Loc --   ?   Pain Edu? --   ?   Excl. in [place]? --   ? ? ?Most recent vital signs: ?Vitals:  ? 12/28/2021 1910  ?BP: (!) 148/67  ?Pulse: (!) 125  ?Resp: (!) 29  ?Temp: 99.8 ?F (37.7 ?C)  ?SpO2: 96%  ? ? ?General: Awake, no distress.  ?CV:  Good peripheral perfusion. Tachycardia. ?Resp:  Slightly increased effort.  Rhonchi to right lung.  ?Abd:  No distention. Non tender. ? ? ?ED Results / Procedures / Treatments  ? ?Labs ?(all labs ordered are listed, but only abnormal results are displayed) ?Labs Reviewed  ?BASIC METABOLIC PANEL - Abnormal; Notable for the following components:  ?    Result Value  ? Glucose, Bld 145 (*)   ? Creatinine, Ser 1.28  (*)   ? Calcium 8.8 (*)   ? GFR, Estimated 58 (*)   ? All other components within normal limits  ?CBC WITH DIFFERENTIAL/PLATELET - Abnormal; Notable for the following components:  ? WBC 11.6 (*)   ? Neutro Abs 9.3 (*)   ? Monocytes Absolute 1.5 (*)   ? All other components within normal limits  ?RESP PANEL BY RT-PCR (FLU A&B, COVID) [id number]  ?CULTURE, BLOOD (ROUTINE X 2)  ?CULTURE, BLOOD (ROUTINE X 2)  ?TROPONIN I (HIGH SENSITIVITY)  ? ? ? ?EKG ? ?INance Pear, attending physician, personally viewed and interpreted this EKG ? ?EKG Time: 1911 ?Rate: 131 ?Rhythm: sinus tachycardia ?Axis: left axis deviation ?Intervals: qtc 437 ?QRS: narrow, q waves v1 ?ST changes: no st elevation ?Impression: abnormal ekg ? ?RADIOLOGY ?I independently interpreted and visualized the cxr. My interpretation: right lung pneumonia ?Radiology interpretation:  ?IMPRESSION:  ?1. Dense perihilar right upper lobe consolidation, suspicious for  ?pneumonia. Small amount of adjacent fissural fluid.  Recommend close  ?radiographic follow-up to resolution.  ?2. Advanced emphysema.  ?3. Left basilar scarring.  ?   ? ? ?PROCEDURES: ? ?Critical Care performed: No ? ?Procedures ? ? ?MEDICATIONS ORDERED IN ED: ?Medications - No data to display ? ? ?IMPRESSION / MDM / ASSESSMENT AND PLAN / ED COURSE  ?I reviewed the triage vital signs and the nursing notes. ?             ?               ? ?Differential diagnosis includes, but is not limited to, pneumonia, COPD, CHF, COVID. ? ?Patient presented to the emergency department today because of concerns for shortness of breath and chills for the past 3 days.  On exam he does have some rhonchi in the right lung.  Chest x-ray is concerning for pneumonia to the right lung.  Very mild leukocytosis.  Discussed this finding with the patient.  We will plan on starting antibiotics for pneumonia. Discussed case with Dr. Tobie Poet, hospitalist who will plan on admission. ?  ? ? ?FINAL CLINICAL IMPRESSION(S) / ED DIAGNOSES   ? ?Final diagnoses:  ?Shortness of breath  ?Community acquired pneumonia of right lung, unspecified part of lung  ? ? ? ?Note:  This document was prepared using Dragon voice recognition software and may include unintentional dictation errors. ? ?  ?Nance Pear, MD ?12/24/2021 2032 ? ?

## 2021-12-30 NOTE — ED Triage Notes (Addendum)
Pt to ED by EMS from home c/o SOB and productive cough onset of 1xday. Pt has a hx of COPD. EMS found pt at home on 2lnc and sating in the low 80s. After DuoNeb pt noted to have clear lung sounds bilaterally. HR is elevated, BP within normal limits, NADN. Pt was scheduled to start radiation for prostate cancer this past week. ?

## 2021-12-30 NOTE — H&P (Signed)
History and Physical   Thomas Trujillo:[id number] DOB: Jun 01, 1946 DOA: 12/23/2021  PCP: [name], MD  Outpatient Specialists: Dr. Diamantina Providence Patient coming from: Home via EMS  I have personally briefly reviewed patient's old medical records in [place].  Chief Concern: Shortness of breath  HPI: Thomas Trujillo is a 76 year old male with history of BPH, hyperlipidemia, COPD, history of CAD, prostate cancer, history of NSTEMI, status post CABG x2, history of pneumothorax on the left, who presents emergency department for chief concerns of shortness of breath.  Initial vitals in the emergency department was 99.8, respiration rate was elevated at 29, heart rate 125, blood pressure 148/64, SPO2 of 96% on 6 L nasal cannula.  Serum sodium 135, potassium 3.5, chloride 99, bicarb 24, BUN 21, serum creatinine 1.28, nonfasting blood glucose 145, GFR 58, WBC 11.6, hemoglobin 13.4, platelets of 217.  Portable chest x-ray in the ED was ordered by EDP and was read as dense perihilar right upper lobe consolidation, suspicious for pneumonia.  Small amount of adjacent fissural fluid.  Advanced emphysema.  ED treatment: Ceftriaxone 1 g IV, azithromycin 500 mg IV, LR 1 L bolus.  At bedside, he is able to tell me his name, age, current location, current calendar year.   He reports that at baseline he uses 2 L [place] when he is active.   In the last four days, he has needed to use the O2 at 4 L [place] continuously. He had a fever. He had radiation therapy on Tuesday, and it was 101. He did not get therapy and was sent to get a covid test. He was sent home.    He has been having productive cough that was productive of tan phlegm. He endorses chest pain with coughing.  He he is currently being evaluated for radiation therapy.   Social history: He lives by himself. He is former tobacco user, at his peak, he smoked 1 ppd. He quit in 2010. He endorses infrequent etoh use of wine. He denie recreational  drug use. He is retired and formerly worked as a Chief Technology Officer.   Vaccination history: He is not vaccinated for covid 19. He is vaccinated for influenza.   ROS: Constitutional: no weight change, no fever ENT/Mouth: no sore throat, no rhinorrhea Eyes: no eye pain, no vision changes Cardiovascular: no chest pain, + dyspnea,  no edema, no palpitations Respiratory: + cough, no sputum, no wheezing Gastrointestinal: no nausea, no vomiting, no diarrhea, no constipation Genitourinary: no urinary incontinence, no dysuria, no hematuria Musculoskeletal: no arthralgias, no myalgias Skin: no skin lesions, no pruritus, Neuro: + weakness, no loss of consciousness, no syncope Psych: no anxiety, no depression, + decrease appetite Heme/Lymph: no bruising, no bleeding  ED Course: Discussed with emergency medicine provider, patient requiring hospitalization for chief concerns of pneumonia.  Assessment/Plan  Principal Problem:   Community acquired pneumonia Active Problems:   Coronary artery disease involving native coronary artery of native heart without angina pectoris   Hyperlipidemia LDL goal <70   Chronic obstructive pulmonary disease (HCC)   Chronic respiratory failure with hypoxia (HCC)   Acute on chronic respiratory failure with hypoxia (HCC)   Prostate cancer (HCC)   Chest pain, non-cardiac   Elevated troponin   * Community acquired pneumonia Assessment & Plan - Status post ceftriaxone 1 g per EDP, azithromycin - Ordered additional ceftriaxone 1 g to complete 2 g during admission - Ceftriaxone 2 g daily, and azithromycin 500 mg daily starting on 12/31/2021 -  Continue oxygen supplementation at 7 L to maintain SPO2 greater than 92% - Telemetry cardiac, observation  Coronary artery disease involving native coronary artery of native heart without angina pectoris Assessment & Plan - Resumed home aspirin 81 mg daily, atorvastatin 40 mg daily  Acute on chronic respiratory failure with  hypoxia (HCC) Assessment & Plan - Presumed secondary to pneumonia - Treat with azithromycin and ceftriaxone  Chronic respiratory failure with hypoxia (HCC) Assessment & Plan - At baseline he uses 2 L nasal cannula as needed/with exertion - If he is resting and watching TV he does not wear oxygen supplementation at home  Chronic obstructive pulmonary disease (HCC) Assessment & Plan - Albuterol 2 puff inhalation every 6 hours as needed for shortness of breath and wheezing - Appears compensated at this time - Resumed home budesonide nebulizer daily, montelukast nightly  Prostate cancer ([place]) Assessment & Plan - Continue outpatient follow-up with urologist  Elevated troponin Assessment & Plan - Mild troponin elevation, no cardiac chest pain - Low clinical suspicion for ACS at this time - Presumed secondary to hypoxia in setting of acute on chronic respiratory distress secondary to pneumonia  Chest pain, non-cardiac Assessment & Plan - Chest pain with cough - Lidocaine as needed ordered  Hyperlipidemia LDL goal <70 Assessment & Plan - Resumed home atorvastatin 40 mg daily  Chart reviewed.   DVT prophylaxis: Enoxaparin Code Status: Full code Diet: Heart healthy Family Communication: No Disposition Plan: Pending clinical course Consults called: None at this time Admission status: Telemetry cardiac, observation  Past Medical History:  Diagnosis Date   Bleb, lung (HCC)    COPD (chronic obstructive pulmonary disease) ([place])    Coronary artery disease 02/14/2017   NSTEMI with urgent CABG (LIMA-LAD and SVG->ramus)   Hyperlipidemia    Ischemic cardiomyopathy    Lung nodule    Pneumothorax 05/2017   Left   Prostate cancer [facility]    Past Surgical History:  Procedure Laterality Date   CATARACT EXTRACTION, BILATERAL     CORONARY ARTERY BYPASS GRAFT N/A 02/14/2017   Procedure: CORONARY ARTERY BYPASS GRAFTING (CABG) x 2 , using left internal mammary artery and right leg  greater saphenous vein harvested endoscopically LIMA-LAD, SVG-RAMUS;  Surgeon: [name], MD;  Location: [place];  Service: Open Heart Surgery;  Laterality: N/A;   ENDOVEIN HARVEST OF GREATER SAPHENOUS VEIN Right 02/14/2017   Procedure: ENDOVEIN HARVEST OF RIGHT THIGH GREATER SAPHENOUS VEIN;  Surgeon: [name], MD;  Location: [place];  Service: Open Heart Surgery;  Laterality: Right;   LEFT HEART CATH AND CORONARY ANGIOGRAPHY N/A 02/14/2017   Procedure: Left Heart Cath and Coronary Angiography;  Surgeon: [name], MD;  Location: [place] [facility];  Service: Cardiovascular;  Laterality: N/A;   NASAL POLYP SURGERY     STAPLING OF BLEBS N/A 02/14/2017   Procedure: STAPLING OF LARGE LEFT UPPER LOBE PULMONARY BLEB;  Surgeon: [name], MD;  Location: [place];  Service: Open Heart Surgery;  Laterality: N/A;   TEE WITHOUT CARDIOVERSION N/A 02/14/2017   Procedure: TRANSESOPHAGEAL ECHOCARDIOGRAM (TEE);  Surgeon: [name], MD;  Location: [place];  Service: Open Heart Surgery;  Laterality: N/A;   VIDEO ASSISTED THORACOSCOPY (VATS) W/TALC PLEUADESIS N/A 11/23/2017   Procedure: VIDEO ASSISTED THORACOSCOPY (VATS) W/TALC PLEUADESIS;  Surgeon: Nestor Lewandowsky, MD;  Location: [facility];  Service: Thoracic;  Laterality: N/A;   Social History:  reports that he quit smoking about 14 years ago. His smoking use included cigarettes. He has  never used smokeless tobacco. He reports that he does not drink alcohol and does not use drugs.  Allergies  Allergen Reactions   No Known Allergies    Family History  Problem Relation Age of Onset   Obesity Son    Family history: Family history reviewed and not pertinent  Prior to Admission medications   Medication Sig Start Date End Date Taking? Authorizing Provider  albuterol (VENTOLIN HFA) 108 (90 Base) MCG/ACT inhaler Inhale 2 puffs into the lungs every 6 (six) hours as needed for wheezing or shortness of breath.   Yes [provider]  aspirin EC 81 MG tablet Take 1 tablet (81 mg total) by mouth daily. 11/08/17  Yes End, Harrell Gave, MD  atorvastatin (LIPITOR) 40 MG tablet Take 40 mg by mouth daily.   Yes [provider]  budesonide (PULMICORT) 0.5 MG/2ML nebulizer solution Take 0.5 mg by nebulization daily. 11/08/21  Yes [provider]  ferrous sulfate 325 (65 FE) MG tablet Take 325 mg by mouth daily with breakfast.   Yes [provider]  furosemide (LASIX) 20 MG tablet Take 1-2 tabs as directed once daily with additional dose if gains 2 lbs in a day or 5lbs in a week. PLEASE CALL [phone] TO SCHEDULE APPOINTMENT FOR FURTHER REFILLS. THANK YOU! 07/16/19  Yes End, Harrell Gave, MD  montelukast (SINGULAIR) 10 MG tablet Take 10 mg by mouth at bedtime. 08/01/21  Yes [provider]  Multiple Vitamins-Minerals (MULTIVITAMIN ADULT PO) Take 1 tablet by mouth daily.   Yes [provider]  OXYGEN Inhale into the lungs. By nasal cannula and wean as tolerated to keep O2 sats >90%   Yes [provider]  potassium chloride (K-DUR) 10 MEQ tablet Take 10 mEq by mouth daily.   Yes [provider]  tamsulosin (FLOMAX) 0.4 MG CAPS capsule Take 1 capsule (0.4 mg total) by mouth daily. 02/25/17  Yes Elgie Collard, [place]   Physical Exam: Vitals:   01/02/2022 1910 12/28/2021 1912 01/21/2022 2200 12/31/21 0000  BP: (!) 148/67  115/65 106/62  Pulse: (!) 125  (!) 110 95  Resp: (!) 29  (!) 24 20  Temp: 99.8 F (37.7 C)     SpO2: 96%  98% 98%  Weight:  68 kg    Height:  '5\' 4"'$  (1.626 m)     Constitutional: appears age-appropriate, NAD, calm, comfortable Eyes: PERRL, lids and conjunctivae normal ENMT: Mucous membranes are moist. Posterior pharynx clear of any exudate or lesions. Age-appropriate dentition.  Mild hearing loss Neck: normal, supple, no masses, no thyromegaly Respiratory: Decreased lung sounds on right middle to lower lobe, no wheezing, no crackles. Normal respiratory effort.  No accessory muscle use.  Cardiovascular: Regular rate and rhythm, no murmurs / rubs / gallops. No extremity edema. 2+ pedal pulses. No carotid bruits.  Abdomen: no tenderness, no masses palpated, no hepatosplenomegaly. Bowel sounds positive.  Musculoskeletal: no clubbing / cyanosis. No joint deformity upper and lower extremities. Good ROM, no contractures, no atrophy. Normal muscle tone.  Skin: no rashes, lesions, ulcers. No induration Neurologic: Sensation intact. Strength 5/5 in all 4.  Psychiatric: Normal judgment and insight. Alert and oriented x 3. Normal mood.   EKG: independently reviewed, showing sinus tachycardia with rate of 131, QTc 437  Chest x-ray on Admission: I personally reviewed and I agree with radiologist reading as below.  DG Chest Port 1 View  Result Date: 01/19/2022 CLINICAL DATA:  Shortness of breath.  Cough. EXAM: PORTABLE CHEST 1 VIEW COMPARISON:  Radiograph 12/01/2017. FINDINGS: Advanced emphysema. There is dense consolidation in the perihilar right upper lobe abutting the fissure. Small amount of adjacent fissural fluid. Left basilar scarring with seen on prior exam. Post median sternotomy and CABG. The heart is normal in size. No pulmonary edema. No pneumothorax. IMPRESSION: 1. Dense perihilar right upper lobe consolidation, suspicious for pneumonia. Small amount of adjacent fissural fluid. Recommend close radiographic follow-up to resolution. 2. Advanced emphysema. 3. Left basilar scarring. Electronically Signed   By: Keith Rake M.D.   On: 01/04/2022 19:57    Labs on Admission: I have personally reviewed following labs  CBC: Recent Labs  Lab 01/05/2022 1916  WBC 11.6*  NEUTROABS 9.3*  HGB 13.4  HCT 40.0  MCV 91.7  PLT 408   Basic Metabolic Panel: Recent Labs  Lab 12/23/2021 1916  NA 135  K 3.5  CL 99  CO2 24  GLUCOSE 145*  BUN 21  CREATININE 1.28*  CALCIUM 8.8*   GFR: Estimated Creatinine Clearance: 41.8 mL/min (A) (by C-G formula based on SCr  of 1.28 mg/dL (H)).  Urine analysis:    Component Value Date/Time   COLORURINE YELLOW 02/24/2017 1959   APPEARANCEUR CLEAR 02/24/2017 1959   LABSPEC 1.010 02/24/2017 1959   PHURINE 5.0 02/24/2017 1959   GLUCOSEU NEGATIVE 02/24/2017 1959   HGBUR NEGATIVE 02/24/2017 1959   BILIRUBINUR NEGATIVE 02/24/2017 1959   KETONESUR NEGATIVE 02/24/2017 1959   PROTEINUR NEGATIVE 02/24/2017 1959   NITRITE NEGATIVE 02/24/2017 1959   LEUKOCYTESUR NEGATIVE 02/24/2017 1959   Dr. Tobie Poet [facility]  If 7PM-7AM, please contact overnight-coverage provider If 7AM-7PM, please contact day coverage provider [email or web address]  12/31/2021, 12:55 AM

## 2021-12-31 ENCOUNTER — Ambulatory Visit: Payer: Medicare Other

## 2021-12-31 DIAGNOSIS — R0789 Other chest pain: Secondary | ICD-10-CM | POA: Diagnosis present

## 2021-12-31 DIAGNOSIS — J9621 Acute and chronic respiratory failure with hypoxia: Secondary | ICD-10-CM | POA: Diagnosis present

## 2021-12-31 DIAGNOSIS — Z7951 Long term (current) use of inhaled steroids: Secondary | ICD-10-CM | POA: Diagnosis not present

## 2021-12-31 DIAGNOSIS — I252 Old myocardial infarction: Secondary | ICD-10-CM | POA: Diagnosis not present

## 2021-12-31 DIAGNOSIS — D849 Immunodeficiency, unspecified: Secondary | ICD-10-CM | POA: Diagnosis present

## 2021-12-31 DIAGNOSIS — R251 Tremor, unspecified: Secondary | ICD-10-CM | POA: Diagnosis present

## 2021-12-31 DIAGNOSIS — J189 Pneumonia, unspecified organism: Secondary | ICD-10-CM | POA: Diagnosis present

## 2021-12-31 DIAGNOSIS — Z28310 Unvaccinated for covid-19: Secondary | ICD-10-CM | POA: Diagnosis not present

## 2021-12-31 DIAGNOSIS — N400 Enlarged prostate without lower urinary tract symptoms: Secondary | ICD-10-CM | POA: Diagnosis present

## 2021-12-31 DIAGNOSIS — Z87891 Personal history of nicotine dependence: Secondary | ICD-10-CM | POA: Diagnosis not present

## 2021-12-31 DIAGNOSIS — J441 Chronic obstructive pulmonary disease with (acute) exacerbation: Secondary | ICD-10-CM | POA: Diagnosis not present

## 2021-12-31 DIAGNOSIS — R7881 Bacteremia: Secondary | ICD-10-CM | POA: Diagnosis not present

## 2021-12-31 DIAGNOSIS — C61 Malignant neoplasm of prostate: Secondary | ICD-10-CM | POA: Diagnosis present

## 2021-12-31 DIAGNOSIS — R6520 Severe sepsis without septic shock: Secondary | ICD-10-CM | POA: Diagnosis present

## 2021-12-31 DIAGNOSIS — R0602 Shortness of breath: Secondary | ICD-10-CM | POA: Diagnosis present

## 2021-12-31 DIAGNOSIS — Z66 Do not resuscitate: Secondary | ICD-10-CM | POA: Diagnosis not present

## 2021-12-31 DIAGNOSIS — R778 Other specified abnormalities of plasma proteins: Secondary | ICD-10-CM | POA: Diagnosis present

## 2021-12-31 DIAGNOSIS — E785 Hyperlipidemia, unspecified: Secondary | ICD-10-CM | POA: Diagnosis present

## 2021-12-31 DIAGNOSIS — Z20822 Contact with and (suspected) exposure to covid-19: Secondary | ICD-10-CM | POA: Diagnosis present

## 2021-12-31 DIAGNOSIS — Z515 Encounter for palliative care: Secondary | ICD-10-CM | POA: Diagnosis not present

## 2021-12-31 DIAGNOSIS — I2510 Atherosclerotic heart disease of native coronary artery without angina pectoris: Secondary | ICD-10-CM | POA: Diagnosis present

## 2021-12-31 DIAGNOSIS — A419 Sepsis, unspecified organism: Secondary | ICD-10-CM | POA: Diagnosis present

## 2021-12-31 DIAGNOSIS — Z951 Presence of aortocoronary bypass graft: Secondary | ICD-10-CM | POA: Diagnosis not present

## 2021-12-31 DIAGNOSIS — I255 Ischemic cardiomyopathy: Secondary | ICD-10-CM | POA: Diagnosis present

## 2021-12-31 DIAGNOSIS — Z7982 Long term (current) use of aspirin: Secondary | ICD-10-CM | POA: Diagnosis not present

## 2021-12-31 DIAGNOSIS — Z79899 Other long term (current) drug therapy: Secondary | ICD-10-CM | POA: Diagnosis not present

## 2021-12-31 DIAGNOSIS — J9611 Chronic respiratory failure with hypoxia: Secondary | ICD-10-CM | POA: Diagnosis not present

## 2021-12-31 DIAGNOSIS — Z9981 Dependence on supplemental oxygen: Secondary | ICD-10-CM | POA: Diagnosis not present

## 2021-12-31 DIAGNOSIS — J432 Centrilobular emphysema: Secondary | ICD-10-CM | POA: Diagnosis present

## 2021-12-31 DIAGNOSIS — I248 Other forms of acute ischemic heart disease: Secondary | ICD-10-CM | POA: Diagnosis present

## 2021-12-31 LAB — BASIC METABOLIC PANEL
Anion gap: 8 (ref 5–15)
BUN: 22 mg/dL (ref 8–23)
CO2: 25 mmol/L (ref 22–32)
Calcium: 8.4 mg/dL — ABNORMAL LOW (ref 8.9–10.3)
Chloride: 107 mmol/L (ref 98–111)
Creatinine, Ser: 1.01 mg/dL (ref 0.61–1.24)
GFR, Estimated: >60 mL/min (ref >60)
Glucose, Bld: 150 mg/dL — ABNORMAL HIGH (ref 70–99)
Potassium: 4.1 mmol/L (ref 3.5–5.1)
Sodium: 140 mmol/L (ref 135–145)

## 2021-12-31 LAB — RESPIRATORY PANEL BY PCR

## 2021-12-31 LAB — BLOOD CULTURE ID PANEL (REFLEXED) - BCID2

## 2021-12-31 LAB — CBC
HCT: 39.6 % (ref 39.0–52.0)
Hemoglobin: 12.5 g/dL — ABNORMAL LOW (ref 13.0–17.0)
MCH: 30.6 pg (ref 26.0–34.0)
MCHC: 31.6 g/dL (ref 30.0–36.0)
MCV: 97.1 fL (ref 80.0–100.0)
Platelets: 195 10*3/uL (ref 150–400)
RBC: 4.08 MIL/uL — ABNORMAL LOW (ref 4.22–5.81)
RDW: 13.6 % (ref 11.5–15.5)
WBC: 11.2 10*3/uL — ABNORMAL HIGH (ref 4.0–10.5)
nRBC: 0 % (ref 0.0–0.2)

## 2021-12-31 LAB — LACTIC ACID, PLASMA: Lactic Acid, Venous: 0.9 mmol/L (ref 0.5–1.9)

## 2021-12-31 LAB — STREP PNEUMONIAE URINARY ANTIGEN: Strep Pneumo Urinary Antigen: NEGATIVE

## 2021-12-31 MED ORDER — MONTELUKAST SODIUM 10 MG PO TABS
10.0000 mg | ORAL_TABLET | Freq: Every day | ORAL | Status: DC
  Administered 2021-12-31 – 2022-01-08 (×10): 10 mg via ORAL
  Filled 2021-12-31 (×10): qty 1

## 2021-12-31 MED ORDER — FERROUS SULFATE 325 (65 FE) MG PO TABS
325.0000 mg | ORAL_TABLET | Freq: Every day | ORAL | Status: DC
  Administered 2021-12-31 – 2022-01-09 (×10): 325 mg via ORAL
  Filled 2021-12-31 (×10): qty 1

## 2021-12-31 MED ORDER — ALBUTEROL SULFATE (2.5 MG/3ML) 0.083% IN NEBU
2.5000 mg | INHALATION_SOLUTION | RESPIRATORY_TRACT | Status: DC | PRN
  Administered 2021-12-31 – 2022-01-09 (×15): 2.5 mg via RESPIRATORY_TRACT
  Filled 2021-12-31 (×13): qty 3

## 2021-12-31 MED ORDER — ADULT MULTIVITAMIN W/MINERALS CH
ORAL_TABLET | Freq: Every day | ORAL | Status: DC
  Administered 2021-12-31 – 2022-01-09 (×10): 1 via ORAL
  Filled 2021-12-31 (×10): qty 1

## 2021-12-31 MED ORDER — BUDESONIDE 0.5 MG/2ML IN SUSP
0.5000 mg | Freq: Every day | RESPIRATORY_TRACT | Status: DC
  Administered 2021-12-31 – 2022-01-01 (×2): 0.5 mg via RESPIRATORY_TRACT
  Filled 2021-12-31 (×3): qty 2

## 2021-12-31 MED ORDER — ASPIRIN EC 81 MG PO TBEC
81.0000 mg | DELAYED_RELEASE_TABLET | Freq: Every day | ORAL | Status: DC
  Administered 2021-12-31 – 2022-01-09 (×10): 81 mg via ORAL
  Filled 2021-12-31 (×10): qty 1

## 2021-12-31 MED ORDER — AZITHROMYCIN 250 MG PO TABS
500.0000 mg | ORAL_TABLET | Freq: Every day | ORAL | Status: AC
Start: 2021-12-31 — End: 2022-01-03
  Administered 2021-12-31 – 2022-01-03 (×4): 500 mg via ORAL
  Filled 2021-12-31 (×4): qty 2

## 2021-12-31 MED ORDER — ALBUTEROL SULFATE HFA 108 (90 BASE) MCG/ACT IN AERS
2.0000 | INHALATION_SPRAY | Freq: Four times a day (QID) | RESPIRATORY_TRACT | Status: DC | PRN
Start: 2021-12-31 — End: 2021-12-31

## 2021-12-31 MED ORDER — DM-GUAIFENESIN ER 30-600 MG PO TB12
1.0000 | ORAL_TABLET | Freq: Two times a day (BID) | ORAL | Status: DC
  Administered 2021-12-31 (×2): 1 via ORAL
  Filled 2021-12-31 (×2): qty 1

## 2021-12-31 MED ORDER — ALBUTEROL SULFATE (2.5 MG/3ML) 0.083% IN NEBU
2.5000 mg | INHALATION_SOLUTION | Freq: Four times a day (QID) | RESPIRATORY_TRACT | Status: DC | PRN
Start: 2021-12-31 — End: 2021-12-31
  Administered 2021-12-31 (×2): 2.5 mg via RESPIRATORY_TRACT
  Filled 2021-12-31 (×2): qty 3

## 2021-12-31 MED ORDER — TAMSULOSIN HCL 0.4 MG PO CAPS
0.4000 mg | ORAL_CAPSULE | Freq: Every day | ORAL | Status: DC
  Administered 2021-12-31 – 2022-01-09 (×10): 0.4 mg via ORAL
  Filled 2021-12-31 (×10): qty 1

## 2021-12-31 MED ORDER — LIDOCAINE 5 % EX PTCH
1.0000 | MEDICATED_PATCH | Freq: Every day | CUTANEOUS | Status: DC | PRN
  Filled 2021-12-31: qty 1

## 2021-12-31 NOTE — Assessment & Plan Note (Signed)
-   Albuterol 2 puff inhalation every 6 hours as needed for shortness of breath and wheezing ?- Appears compensated at this time ?- Resumed home budesonide nebulizer daily, montelukast nightly ?

## 2021-12-31 NOTE — Assessment & Plan Note (Signed)
-   Status post ceftriaxone 1 g per EDP, azithromycin ?- Ordered additional ceftriaxone 1 g to complete 2 g during admission ?- Ceftriaxone 2 g daily, and azithromycin 500 mg daily starting on 12/31/2021 ?- Continue oxygen supplementation at 7 L to maintain SPO2 greater than 92% ?- Telemetry cardiac, observation ?

## 2021-12-31 NOTE — Assessment & Plan Note (Signed)
-   Chest pain with cough ?- Lidocaine as needed ordered ?

## 2021-12-31 NOTE — Assessment & Plan Note (Signed)
Sepsis present on admission as evidenced by tachycardia and leukocytosis in the setting of pneumonia.  No evidence of organ dysfunction to suggest severe sepsis or septic shock.   ?Sepsis physiology improving. ?-- Continue antibiotics and follow cultures ?-- Monitor hemodynamics closely ?

## 2021-12-31 NOTE — Assessment & Plan Note (Signed)
Due to demand ischemia in the setting of acute on chronic respiratory failure with hypoxia in the setting of pneumonia.  Patient has no chest pain and troponin trend was flat. ?

## 2021-12-31 NOTE — Assessment & Plan Note (Addendum)
Complicated by acute on chronic hypoxic respiratory failure.  Patient presented with worsening shortness of breath, fevers and cough.  Chest xray showed dense RUL pneumonia (vs ?underlying malignancy) and emphysema. -- Continue IV Rocephin and Zithromax -- Supplemental oxygen and wean as tolerated -- Scheduled Mucinex -- Steroids & neb treatments as outlined -- As needed antitussives -- Incentive spirometer and flutter valve -- Sputum culture pending -- CT vs CTA chest pending D-dimer  -- Pulmonology consult -- Repeat imaging at 6-8 weeks to ensure resolution

## 2021-12-31 NOTE — Assessment & Plan Note (Addendum)
Patient has advanced COPD follows with Dr. Raul Del.  On 2 L/min oxygen with exertion at home.  Initially had no expiratory wheezing on exam to suggest exacerbation. 3/12: Improved air movement no expiratory wheezes on exam today, remains on 4 L/min oxygen -- Continue Solu-Medrol at current dose, transition to prednisone tomorrow if further improvement -- Continue home Pulmicort nebs and as needed albuterol nebs --Add scheduled DuoNebs -- Follow-up with pulmonology outpatient as scheduled -- Mucolytic's

## 2021-12-31 NOTE — Assessment & Plan Note (Signed)
-   Presumed secondary to pneumonia ?- Treat with azithromycin and ceftriaxone ?

## 2021-12-31 NOTE — Assessment & Plan Note (Signed)
No acute issues.  Reportedly soon to start on radiation therapy.  When he presented for his initial treatment he was found to be febrile resulting in this admission for pneumonia. ?-- Follow-up with urology, oncology, radiation oncology as scheduled ?

## 2021-12-31 NOTE — Assessment & Plan Note (Signed)
Baseline oxygen requirement 2 L/min with activity, due to advanced COPD. ?

## 2021-12-31 NOTE — Assessment & Plan Note (Signed)
-   Mild troponin elevation, no cardiac chest pain ?- Low clinical suspicion for ACS at this time ?- Presumed secondary to hypoxia in setting of acute on chronic respiratory distress secondary to pneumonia ?

## 2021-12-31 NOTE — ED Notes (Signed)
Informed RN bed assigned 

## 2021-12-31 NOTE — Assessment & Plan Note (Signed)
Musculoskeletal etiology in the setting of coughing fits due to pneumonia.  Supportive care. ?

## 2021-12-31 NOTE — Assessment & Plan Note (Signed)
-   Continue outpatient follow-up with urologist ?

## 2021-12-31 NOTE — Progress Notes (Signed)
PHARMACIST - PHYSICIAN COMMUNICATION ? ?CONCERNING: Antibiotic IV to Oral Route Change Policy ? ?RECOMMENDATION: ?This patient is receiving azithromycin by the intravenous route.  Based on criteria approved by the Pharmacy and Therapeutics Committee, the antibiotic(s) is/are being converted to the equivalent oral dose form(s). ? ? ?DESCRIPTION: ?These criteria include: ?Patient being treated for a respiratory tract infection, urinary tract infection, cellulitis or clostridium difficile associated diarrhea if on metronidazole ?The patient is not neutropenic and does not exhibit a GI malabsorption state ?The patient is eating (either orally or via tube) and/or has been taking other orally administered medications for a least 24 hours ?The patient is improving clinically and has a Tmax < 100.5 ? ?If you have questions about this conversion, please contact the Pharmacy Department  ? ?Benita Gutter ?12/31/21  ?  ?

## 2021-12-31 NOTE — Assessment & Plan Note (Signed)
-  Continue Lipitor °

## 2021-12-31 NOTE — Assessment & Plan Note (Signed)
-   At baseline he uses 2 L nasal cannula as needed/with exertion ?- If he is resting and watching TV he does not wear oxygen supplementation at home ?

## 2021-12-31 NOTE — Assessment & Plan Note (Signed)
Chronic and stable.  Patient without chest pain.  Continue aspirin, Lipitor. ?

## 2021-12-31 NOTE — Assessment & Plan Note (Signed)
-   Resumed home aspirin 81 mg daily, atorvastatin 40 mg daily ?

## 2021-12-31 NOTE — Assessment & Plan Note (Addendum)
Baseline oxygen requirement 2 L/min with exertion.  On admission, required as much as 6 to 7 L/min to maintain sats in the setting of pneumonia with symptomatic shortness of breath and cough. -- Supplemental O2 as needed to maintain sats 88 to 93% -- Wean O2 as tolerated -- Treat pneumonia as outlined -- Check ambulatory O2 requirements --Out of bed to chair and mobilize as tolerated  3/13: Remains on 4 L/min supplemental O2

## 2021-12-31 NOTE — Assessment & Plan Note (Signed)
-   Resumed home atorvastatin 40 mg daily ?

## 2021-12-31 NOTE — Progress Notes (Signed)
Admission profile updated. ?

## 2021-12-31 NOTE — Hospital Course (Signed)
Thomas Trujillo is a 76 year old male with history of BPH, hyperlipidemia, advanced COPD on 2 L/min home oxygen with activity only, history of CAD, prostate cancer soon to start on radiation therapy, history of NSTEMI, status post CABG x2, history of pneumothorax on the left, who presented to the ED on 01/18/2022 for evaluation of fevers and shortness of breath.  Evaluation of the ED revealed community-acquired pneumonia complicated by acute respiratory failure with hypoxia requiring as much as 6 to 7 L/min oxygen.  Admitted to the hospital started on empiric Rocephin and Zithromax.

## 2021-12-31 NOTE — Progress Notes (Signed)
?Progress Note ? ? ?PatientGarris Trujillo PJK:[id number] DOB: August 17, 1946 DOA: 12/27/2021     0 ?DOS: the patient was seen and examined on 12/31/2021 ?  ?Brief hospital course: ?Thomas Trujillo is a 76 year old male with history of BPH, hyperlipidemia, advanced COPD on 2 L/min home oxygen with activity only, history of CAD, prostate cancer soon to start on radiation therapy, history of NSTEMI, status post CABG x2, history of pneumothorax on the left, who presented to the ED on 01/09/2022 for evaluation of fevers and shortness of breath. ? ?Evaluation of the ED revealed community-acquired pneumonia complicated by acute respiratory failure with hypoxia requiring as much as 6 to 7 L/min oxygen. ? ?Admitted to the hospital started on empiric Rocephin and Zithromax. ? ?Assessment and Plan: ?* Community acquired pneumonia ?Complicated by acute on chronic hypoxic respiratory failure.  Patient presented with worsening shortness of breath, fevers and cough.  Imaging showed right-sided pneumonia. ?-- Continue IV Rocephin and Zithromax ?-- Supplemental oxygen and wean as tolerated ?-- Scheduled Mucinex ?-- Incentive spirometer and flutter valve ?-- Sputum culture ? ?Sepsis ([place]) ?Sepsis present on admission as evidenced by tachycardia and leukocytosis in the setting of pneumonia.  No evidence of organ dysfunction to suggest severe sepsis or septic shock.   ?Sepsis physiology improving. ?-- Continue antibiotics and follow cultures ?-- Monitor hemodynamics closely ? ?Acute on chronic respiratory failure with hypoxia (HCC) ?Baseline oxygen requirement 2 L/min with exertion.  On admission, required as much as 6 to 7 L/min to maintain sats in the setting of pneumonia with symptomatic shortness of breath and cough. ?-- Supplemental O2 as needed to maintain sats 88 to 93% ?-- Wean O2 as tolerated ?-- Treat pneumonia as outlined ? ?Chronic respiratory failure with hypoxia (HCC) ?Baseline oxygen requirement 2 L/min with activity, due to  advanced COPD. ? ?Chronic obstructive pulmonary disease ([place]) ?Patient has advanced COPD follows with Dr. Raul Del.  On 2 L/min oxygen with exertion at home.  No expiratory wheezing on exam to suggest exacerbation at this time although high risk to develop exacerbation with pneumonia. ?-- Continue home Pulmicort nebs and as needed albuterol ?-- Follow-up with pulmonology outpatient as scheduled ? ?Chest pain, non-cardiac ?Musculoskeletal etiology in the setting of coughing fits due to pneumonia.  Supportive care. ? ?Elevated troponin ?Due to demand ischemia in the setting of acute on chronic respiratory failure with hypoxia in the setting of pneumonia.  Patient has no chest pain and troponin trend was flat. ? ?Coronary artery disease involving native coronary artery of native heart without angina pectoris ?Chronic and stable.  Patient without chest pain.  Continue aspirin, Lipitor. ? ?Hyperlipidemia LDL goal <70 ?Continue Lipitor ? ?Prostate cancer ([place]) ?No acute issues.  Reportedly soon to start on radiation therapy.  When he presented for his initial treatment he was found to be febrile resulting in this admission for pneumonia. ?-- Follow-up with urology, oncology, radiation oncology as scheduled ? ? ? ? ?  ? ?Subjective: Patient seen in the ED this morning, holding for bed.  He reported feeling somewhat improved since admission.  No longer having shaking rigors or chills.  Is reporting some sternal and rib cage pain worsened by coughing.  Reports using 2 L/min oxygen at home only with activity.  Does not require oxygen at rest at baseline.  Occasionally coughing up thick phlegm but this seems somewhat improved today.  No other acute complaints at this time. ? ?Physical Exam: ?Vitals:  ? 12/31/21 0800 12/31/21 0900 12/31/21 1100 12/31/21  1200  ?BP: 99/61 118/65 106/60 106/63  ?Pulse: 82 96 86 (!) 102  ?Resp: 19 (!) 27 18 (!) 37  ?Temp:      ?TempSrc:      ?SpO2: 99% 99% 98% 95%  ?Weight:      ?Height:       ? ?General exam: awake, alert, no acute distress ?HEENT: atraumatic, clear conjunctiva, anicteric sclera, hearing grossly normal  ?Respiratory system: CTAB but generally diminished, no wheezes, rales or rhonchi, normal respiratory effort at rest on 4 L/min [place] O2. ?Cardiovascular system: normal S1/S2, RRR, no pedal edema.   ?Gastrointestinal system: soft, NT, ND, no HSM felt, +bowel sounds. ?Central nervous system: A&O x3. no gross focal neurologic deficits, normal speech ?Extremities: moves all, no edema, normal tone ?Skin: dry, intact, normal temperature ?Psychiatry: normal mood, congruent affect, judgement and insight appear normal ? ? ? ?Data Reviewed: ? ?Labs reviewed and notable for creatinine improved from 1.28 down to 1.01, glucose 150, calcium 8.4, lactic acid normal 0.9, troponins 17, 20.  CBC notable for white count 11.2 from 11.6 yesterday.  Hemoglobin 12.5 ? ?Blood culture from admission growing Staphylococcus species preliminary. ? ?Family Communication: None at bedside during rounds, will attempt to call ? ?Disposition: ?Status is: Inpatient ?Remains inpatient appropriate because: Severity of illness on empiric IV antibiotics pending cultures, remains with oxygen requirement well above his baseline. ? ? ? Planned Discharge Destination: Home ? ? ? ?Time spent: 40 minutes ? ?Author: ?Thomas Slocumb, DO ?12/31/2021 1:10 PM ? ?For on call review [email or web address].  ?

## 2022-01-01 DIAGNOSIS — J189 Pneumonia, unspecified organism: Secondary | ICD-10-CM | POA: Diagnosis not present

## 2022-01-01 DIAGNOSIS — J9621 Acute and chronic respiratory failure with hypoxia: Secondary | ICD-10-CM | POA: Diagnosis not present

## 2022-01-01 LAB — CBC
HCT: 35.4 % — ABNORMAL LOW (ref 39.0–52.0)
Hemoglobin: 11.5 g/dL — ABNORMAL LOW (ref 13.0–17.0)
MCH: 30.8 pg (ref 26.0–34.0)
MCHC: 32.5 g/dL (ref 30.0–36.0)
MCV: 94.9 fL (ref 80.0–100.0)
Platelets: 220 10*3/uL (ref 150–400)
RBC: 3.73 MIL/uL — ABNORMAL LOW (ref 4.22–5.81)
RDW: 13.4 % (ref 11.5–15.5)
WBC: 14.5 10*3/uL — ABNORMAL HIGH (ref 4.0–10.5)
nRBC: 0 % (ref 0.0–0.2)

## 2022-01-01 LAB — BASIC METABOLIC PANEL
Anion gap: 7 (ref 5–15)
BUN: 37 mg/dL — ABNORMAL HIGH (ref 8–23)
CO2: 27 mmol/L (ref 22–32)
Calcium: 8.5 mg/dL — ABNORMAL LOW (ref 8.9–10.3)
Chloride: 104 mmol/L (ref 98–111)
Creatinine, Ser: 1.19 mg/dL (ref 0.61–1.24)
GFR, Estimated: >60 mL/min (ref >60)
Glucose, Bld: 115 mg/dL — ABNORMAL HIGH (ref 70–99)
Potassium: 4.2 mmol/L (ref 3.5–5.1)
Sodium: 138 mmol/L (ref 135–145)

## 2022-01-01 LAB — PROCALCITONIN: Procalcitonin: 0.59 ng/mL

## 2022-01-01 LAB — MRSA NEXT GEN BY PCR, NASAL: MRSA by PCR Next Gen: NOT DETECTED

## 2022-01-01 LAB — MAGNESIUM: Magnesium: 2.4 mg/dL (ref 1.7–2.4)

## 2022-01-01 MED ORDER — PREDNISONE 20 MG PO TABS: 40.0000 mg | ORAL_TABLET | Freq: Every day | ORAL | Status: DC

## 2022-01-01 MED ORDER — SODIUM CHLORIDE 0.9 % IV SOLN: INTRAVENOUS | Status: DC | PRN

## 2022-01-01 MED ORDER — METHYLPREDNISOLONE SODIUM SUCC 125 MG IJ SOLR
120.0000 mg | INTRAMUSCULAR | Status: DC
  Administered 2022-01-01 – 2022-01-02 (×2): 120 mg via INTRAVENOUS
  Filled 2022-01-01 (×2): qty 2

## 2022-01-01 MED ORDER — BUDESONIDE 0.25 MG/2ML IN SUSP
0.2500 mg | Freq: Two times a day (BID) | RESPIRATORY_TRACT | Status: DC
  Administered 2022-01-01 – 2022-01-05 (×8): 0.25 mg via RESPIRATORY_TRACT
  Filled 2022-01-01 (×8): qty 2

## 2022-01-01 MED ORDER — GUAIFENESIN-DM 100-10 MG/5ML PO SYRP
5.0000 mL | ORAL_SOLUTION | ORAL | Status: DC | PRN
  Administered 2022-01-01: 5 mL via ORAL
  Filled 2022-01-01: qty 10

## 2022-01-01 MED ORDER — METHYLPREDNISOLONE SODIUM SUCC 125 MG IJ SOLR
60.0000 mg | Freq: Two times a day (BID) | INTRAMUSCULAR | Status: DC
Start: 2022-01-01 — End: 2022-01-01

## 2022-01-01 MED ORDER — IPRATROPIUM-ALBUTEROL 0.5-2.5 (3) MG/3ML IN SOLN
3.0000 mL | RESPIRATORY_TRACT | Status: DC
  Administered 2022-01-01 – 2022-01-04 (×12): 3 mL via RESPIRATORY_TRACT
  Filled 2022-01-01 (×12): qty 3

## 2022-01-01 MED ORDER — HYDROCOD POLI-CHLORPHE POLI ER 10-8 MG/5ML PO SUER
5.0000 mL | Freq: Two times a day (BID) | ORAL | Status: DC | PRN
  Administered 2022-01-08 – 2022-01-09 (×3): 5 mL via ORAL
  Filled 2022-01-01 (×3): qty 5

## 2022-01-01 NOTE — Progress Notes (Addendum)
?Progress Note ? ? ?PatientChatham Trujillo MAU:[id number] DOB: 1945-10-30 DOA: 12/25/2021     1 ?DOS: the patient was seen and examined on 01/01/2022 ?  ?Brief hospital course: ?Thomas Trujillo is a 76 year old male with history of BPH, hyperlipidemia, advanced COPD on 2 L/min home oxygen with activity only, history of CAD, prostate cancer soon to start on radiation therapy, history of NSTEMI, status post CABG x2, history of pneumothorax on the left, who presented to the ED on 12/27/2021 for evaluation of fevers and shortness of breath. ? ?Evaluation of the ED revealed community-acquired pneumonia complicated by acute respiratory failure with hypoxia requiring as much as 6 to 7 L/min oxygen. ? ?Admitted to the hospital started on empiric Rocephin and Zithromax. ? ?Assessment and Plan: ?* Community acquired pneumonia ?Complicated by acute on chronic hypoxic respiratory failure.  Patient presented with worsening shortness of breath, fevers and cough.  Imaging showed right-sided pneumonia. ?-- Continue IV Rocephin and Zithromax ?-- Supplemental oxygen and wean as tolerated ?-- Scheduled Mucinex ?-- As needed antitussives ?-- Incentive spirometer and flutter valve ?-- Sputum culture pending ? ?Sepsis ([place]) ?Sepsis present on admission as evidenced by tachycardia and leukocytosis in the setting of pneumonia.  No evidence of organ dysfunction to suggest severe sepsis or septic shock.   ?Sepsis physiology improving. ?-- Continue antibiotics and follow cultures ?-- Monitor hemodynamics closely ? ?Acute on chronic respiratory failure with hypoxia (HCC) ?Baseline oxygen requirement 2 L/min with exertion.  On admission, required as much as 6 to 7 L/min to maintain sats in the setting of pneumonia with symptomatic shortness of breath and cough. ?-- Supplemental O2 as needed to maintain sats 88 to 93% ?-- Wean O2 as tolerated ?-- Treat pneumonia as outlined ? ?3/11: Remains on 4 L/min supplemental O2 ? ?Chronic respiratory failure with  hypoxia (HCC) ?Baseline oxygen requirement 2 L/min with activity, due to advanced COPD. ? ?Chronic obstructive pulmonary disease ([place]) ?Patient has advanced COPD follows with Dr. Raul Del.  On 2 L/min oxygen with exertion at home.  Initially had no expiratory wheezing on exam to suggest exacerbation. ?3/11: Noted wheezing on exam and very poor air movement ?-- Start prednisone 40 mg daily  ?-- Continue home Pulmicort nebs and as needed albuterol nebs ?--Add scheduled DuoNebs ?-- Follow-up with pulmonology outpatient as scheduled ?-- Mucolytic's ? ?Chest pain, non-cardiac ?Musculoskeletal etiology in the setting of coughing fits due to pneumonia.  Supportive care. ? ?Elevated troponin ?Due to demand ischemia in the setting of acute on chronic respiratory failure with hypoxia in the setting of pneumonia.  Patient has no chest pain and troponin trend was flat. ? ?Coronary artery disease involving native coronary artery of native heart without angina pectoris ?Chronic and stable.  Patient without chest pain.  Continue aspirin, Lipitor. ? ?Hyperlipidemia LDL goal <70 ?Continue Lipitor ? ?Prostate cancer ([place]) ?No acute issues.  Reportedly soon to start on radiation therapy.  When he presented for his initial treatment he was found to be febrile resulting in this admission for pneumonia. ?-- Follow-up with urology, oncology, radiation oncology as scheduled ? ? ? ? ?  ? ?Subjective: Patient awake sitting up in bed seen with RT at bedside this morning.  He reports ongoing shortness of breath and requiring frequent breathing treatments.  Expresses frustration over his home nebulizers ordered incorrectly on admission.  He was able to provide a sputum culture.  He denies further episodes of rigors or sweats, chills.  Cough continues occasionally productive other times dry.  Difficulty sleeping due to cough. ? ?Physical Exam: ?Vitals:  ? 01/01/22 0810 01/01/22 1024 01/01/22 1052 01/01/22 1140  ?BP: (!) 100/53     ?Pulse: 92 (!)  101  98  ?Resp: 18 (!) '24 20 16  '$ ?Temp: 98.1 ?F (36.7 ?C)     ?TempSrc: Oral     ?SpO2: 96% 94%  93%  ?Weight:      ?Height:      ? ?General exam: awake, alert, no acute distress ?HEENT: Wearing nasal cannula, moist mucus membranes, hearing grossly normal  ?Respiratory system: Poor air movement with intermittent faint expiratory wheezes, increased respiratory effort at rest on 4 L/min [place] O2. ?Cardiovascular system: normal S1/S2, RRR, no pedal edema.   ?Central nervous system: A&O x4. no gross focal neurologic deficits, normal speech ?Extremities: moves all, no edema, normal tone ?Skin: dry, intact, normal temperature ?Psychiatry: normal mood, congruent affect, judgement and insight appear normal ? ? ? ?Data Reviewed: ? ?Labs reviewed and notable for glucose 115, BUN 37, calcium 8.5, procalcitonin 0.59, WBC 14.5 rising, hemoglobin 11.5.  MRSA PCR screen negative.  Repeat blood cultures from 3/10 negative to date.    Initial positive blood culture from admission growing Staph epidermidis ? ?Family Communication: None at bedside on rounds.  Son [name] updated by phone this afternoon. ? ?[name] is patient's Air traffic controller. ?He requests MyChart access and/or daily updates from provider. ? ? ? ?Disposition: ?Status is: Inpatient ?Remains inpatient appropriate because: Severity of illness with persistent oxygen requirement well above baseline and on IV antibiotics pending further clinical improvement.  Awaiting repeat blood cultures to rule out bacteremia. ? ? ? Planned Discharge Destination: Home ? ? ? ?Time spent: 35 minutes ? ?Author: ?Ezekiel Slocumb, DO ?01/01/2022 2:16 PM ? ?For on call review [email or web address].  ?

## 2022-01-01 NOTE — TOC Initial Note (Signed)
Transition of Care (TOC) - Initial/Assessment Note  ? ? ?Patient Details  ?Name: Thomas Trujillo ?MRN: [id number] ?Date of Birth: 11/27/1945 ? ?Transition of Care (TOC) CM/SW Contact:    ?Izola Price, RN ?Phone Number: ?01/01/2022, 12:20 PM ? ?Clinical Narrative: 3/11: Admit 01/17/2022. Per 3/11 rounds:  ?Pretty sick per provider. Greater than 2 days EDD. Initial note: Will follow up when can reach patient/or son.  ?Lived alone and was independent PTA.  ?Respiratory cultures sent per unit RN.  ?Oxygen 2L/[place] Chronic. Acute 4L/[place].  ?Currently being evaluated radiation therapy for prostate cancer.  ?To attempt to wean oxygen back to baseline of 2L/[place] today.  ?Has not been vaccinated for Covid. Has been vaccinated for flu. Covid (-).  ?PCP: Dr. Thereasa Distance ?Cardiologist: Dr. Harrell Gave End.  ?[id number] [place] [place]?[place]owski,Patrick (Son) [phone](Mobile) ?ALL [facility] MAY RELEASE INFORMAITON TO SON PATRICK [place] [phone]?TOC to follow for discharge planning/needs.  ?Simmie DaviesRN CM  [phone]           ? ? ?  ?  ? ? ?Patient Goals and CMS Choice ?  ?  ?  ? ?Expected Discharge Plan and Services ?  ?  ?  ?  ?  ?                ?  ?  ?  ?  ?  ?  ?  ?  ?  ?  ? ?Prior Living Arrangements/Services ?  ?  ?  ?       ?  ?  ?  ?  ? ?Activities of Daily Living ?Home Assistive Devices/Equipment: Oxygen, Dentures (specify type), Eyeglasses, Nebulizer ?ADL Screening (condition at time of admission) ?Patient's cognitive ability adequate to safely complete daily activities?: Yes ?Is the patient deaf or have difficulty hearing?: No ?Does the patient have difficulty seeing, even when wearing glasses/contacts?: No ?Does the patient have difficulty concentrating, remembering, or making decisions?: No ?Patient able to express need for assistance with ADLs?: Yes ?Does the patient have difficulty dressing or bathing?: No ?Independently performs ADLs?: Yes (appropriate for developmental age) ?Does  the patient have difficulty walking or climbing stairs?: No ?Weakness of Legs: None ?Weakness of Arms/Hands: None ? ?Permission Sought/Granted ?  ?  ?   ?   ?   ?   ? ?Emotional Assessment ?  ?  ?  ?  ?  ?  ? ?Admission diagnosis:  Shortness of breath [R06.02] ?Community acquired pneumonia [J18.9] ?Community acquired pneumonia of right lung, unspecified part of lung [J18.9] ?Patient Active Problem List  ? Diagnosis Date Noted  ? Acute on chronic respiratory failure with hypoxia ([place] 12/31/2021  ? Prostate cancer ([place] 12/31/2021  ? Chest pain, non-cardiac 12/31/2021  ? Elevated troponin 12/31/2021  ? Sepsis ([place] 12/31/2021  ? Community acquired pneumonia 01/21/2022  ? Chronic respiratory failure with hypoxia ([place] 03/08/2019  ? Pneumothorax on left 11/18/2017  ? Dyspnea on exertion 09/14/2017  ? Chronic obstructive pulmonary disease ([place] 06/07/2017  ? Pneumothorax 05/30/2017  ? Ischemic cardiomyopathy 04/05/2017  ? Hyperlipidemia LDL goal <70 04/05/2017  ? Coronary artery disease involving native coronary artery of native heart without angina pectoris 02/24/2017  ? S/P CABG x 2 02/14/2017  ? NSTEMI (non-ST elevated myocardial infarction) ([place] 02/13/2017  ? ?PCP:  FSofie Hartigan MD ?Pharmacy:   ?[id number] [place] [id number]: [phone]:  [phone] ? ? ? ? ?Social Determinants of Health (SDOH) Interventions ?  ? ?Readmission Risk Interventions ?No flowsheet data found. ? ? ?

## 2022-01-01 NOTE — Progress Notes (Signed)
Mobility Specialist - Progress Note ? ? 01/01/22 1450  ?Mobility  ?Activity Refused mobility  ? ? ? ?Not medically appropriate for session at this time d/t low SpO2 and persistent cough. Will reattempt at another date and time. ? ?Merrily Brittle ?Mobility Specialist ?01/01/22, 2:51 PM ? ? ? ? ?

## 2022-01-02 ENCOUNTER — Inpatient Hospital Stay: Admit: 2022-01-02 | Payer: Medicare Other

## 2022-01-02 ENCOUNTER — Inpatient Hospital Stay (HOSPITAL_COMMUNITY)
Admit: 2022-01-02 | Discharge: 2022-01-02 | Disposition: A | Discharge status: Home / Self Care | Payer: Medicare Other | Attending: Internal Medicine | Admitting: Internal Medicine

## 2022-01-02 DIAGNOSIS — J9621 Acute and chronic respiratory failure with hypoxia: Secondary | ICD-10-CM | POA: Diagnosis not present

## 2022-01-02 DIAGNOSIS — R7881 Bacteremia: Secondary | ICD-10-CM | POA: Diagnosis not present

## 2022-01-02 DIAGNOSIS — J189 Pneumonia, unspecified organism: Secondary | ICD-10-CM | POA: Diagnosis not present

## 2022-01-02 LAB — BASIC METABOLIC PANEL
Anion gap: 8 (ref 5–15)
BUN: 36 mg/dL — ABNORMAL HIGH (ref 8–23)
CO2: 27 mmol/L (ref 22–32)
Calcium: 8.4 mg/dL — ABNORMAL LOW (ref 8.9–10.3)
Chloride: 105 mmol/L (ref 98–111)
Creatinine, Ser: 0.99 mg/dL (ref 0.61–1.24)
GFR, Estimated: >60 mL/min (ref >60)
Glucose, Bld: 141 mg/dL — ABNORMAL HIGH (ref 70–99)
Potassium: 4.1 mmol/L (ref 3.5–5.1)
Sodium: 140 mmol/L (ref 135–145)

## 2022-01-02 LAB — CULTURE, BLOOD (ROUTINE X 2)
Special Requests: ADEQUATE
Special Requests: ADEQUATE

## 2022-01-02 LAB — CBC
HCT: 36.1 % — ABNORMAL LOW (ref 39.0–52.0)
Hemoglobin: 11.7 g/dL — ABNORMAL LOW (ref 13.0–17.0)
MCH: 30.8 pg (ref 26.0–34.0)
MCHC: 32.4 g/dL (ref 30.0–36.0)
MCV: 95 fL (ref 80.0–100.0)
Platelets: 253 10*3/uL (ref 150–400)
RBC: 3.8 MIL/uL — ABNORMAL LOW (ref 4.22–5.81)
RDW: 13.5 % (ref 11.5–15.5)
WBC: 11.5 10*3/uL — ABNORMAL HIGH (ref 4.0–10.5)
nRBC: 0 % (ref 0.0–0.2)

## 2022-01-02 LAB — PROCALCITONIN: Procalcitonin: 0.39 ng/mL

## 2022-01-02 NOTE — Progress Notes (Signed)
?Progress Note ? ? ?PatientYotam Trujillo SAY:[id number] DOB: 12-Apr-1946 DOA: 01/19/2022     2 ?DOS: the patient was seen and examined on 01/02/2022 ?  ?Brief hospital course: ?Thomas Trujillo is a 76 year old male with history of BPH, hyperlipidemia, advanced COPD on 2 L/min home oxygen with activity only, history of CAD, prostate cancer soon to start on radiation therapy, history of NSTEMI, status post CABG x2, history of pneumothorax on the left, who presented to the ED on 01/02/2022 for evaluation of fevers and shortness of breath. ? ?Evaluation of the ED revealed community-acquired pneumonia complicated by acute respiratory failure with hypoxia requiring as much as 6 to 7 L/min oxygen. ? ?Admitted to the hospital started on empiric Rocephin and Zithromax. ? ?Assessment and Plan: ?* Community acquired pneumonia ?Complicated by acute on chronic hypoxic respiratory failure.  Patient presented with worsening shortness of breath, fevers and cough.  Imaging showed right-sided pneumonia. ?-- Continue IV Rocephin and Zithromax ?-- Supplemental oxygen and wean as tolerated ?-- Scheduled Mucinex ?-- As needed antitussives ?-- Incentive spirometer and flutter valve ?-- Sputum culture pending ? ?Sepsis ([place]) ?Sepsis present on admission as evidenced by tachycardia and leukocytosis in the setting of pneumonia.  No evidence of organ dysfunction to suggest severe sepsis or septic shock.   ?Sepsis physiology improving. ?-- Continue antibiotics and follow cultures ?-- Monitor hemodynamics closely ? ?Acute on chronic respiratory failure with hypoxia (HCC) ?Baseline oxygen requirement 2 L/min with exertion.  On admission, required as much as 6 to 7 L/min to maintain sats in the setting of pneumonia with symptomatic shortness of breath and cough. ?-- Supplemental O2 as needed to maintain sats 88 to 93% ?-- Wean O2 as tolerated ?-- Treat pneumonia as outlined ? ?3/12: Remains on 4 L/min supplemental O2 ? ?Chronic respiratory failure with  hypoxia (HCC) ?Baseline oxygen requirement 2 L/min with activity, due to advanced COPD. ? ?Chronic obstructive pulmonary disease ([place]) ?Patient has advanced COPD follows with Dr. Raul Del.  On 2 L/min oxygen with exertion at home.  Initially had no expiratory wheezing on exam to suggest exacerbation. ?3/12: Improved air movement no expiratory wheezes on exam today, remains on 4 L/min oxygen ?-- Continue Solu-Medrol at current dose, transition to prednisone tomorrow if further improvement ?-- Continue home Pulmicort nebs and as needed albuterol nebs ?--Add scheduled DuoNebs ?-- Follow-up with pulmonology outpatient as scheduled ?-- Mucolytic's ? ?Chest pain, non-cardiac ?Musculoskeletal etiology in the setting of coughing fits due to pneumonia.  Supportive care. ? ?Elevated troponin ?Due to demand ischemia in the setting of acute on chronic respiratory failure with hypoxia in the setting of pneumonia.  Patient has no chest pain and troponin trend was flat. ? ?Coronary artery disease involving native coronary artery of native heart without angina pectoris ?Chronic and stable.  Patient without chest pain.  Continue aspirin, Lipitor. ? ?Hyperlipidemia LDL goal <70 ?Continue Lipitor ? ?Prostate cancer ([place]) ?No acute issues.  Reportedly soon to start on radiation therapy.  When he presented for his initial treatment he was found to be febrile resulting in this admission for pneumonia. ?-- Follow-up with urology, oncology, radiation oncology as scheduled ? ?Positive blood culture ?Blood cultures taken on admission grew Staph epidermidis and staph hemolyticus, very likely contaminants.  Repeat blood cultures from 3/10 are negative x2 days. ?-- Follow repeat cultures ? ?Bacteremia ruled out ? ? ? ? ?  ? ?Subjective: Patient awake resting in bed when seen today.  He reports ongoing nonproductive hacking cough.  No  fevers or chills.  Breathing feels stable may be slightly improved.  Patient denies other acute complaints at  this time. ? ?Physical Exam: ?Vitals:  ? 01/02/22 0418 01/02/22 0753 01/02/22 0756 01/02/22 1201  ?BP: (!) 104/57 (!) 112/59    ?Pulse: 89 84 84 85  ?Resp: '16 18 18 16  '$ ?Temp: 98.1 ?F (36.7 ?C) 97.6 ?F (36.4 ?C)    ?TempSrc: Oral Oral    ?SpO2: 95% 97% 96% 96%  ?Weight:      ?Height:      ? ?General exam: awake, alert, no acute distress ?HEENT: atraumatic, clear conjunctiva, anicteric sclera, moist mucus membranes, hearing grossly normal  ?Respiratory system: Lungs clear with improved aeration, no expiratory wheezes heard today, no rhonchi, mildly increased respiratory effort with conversational dyspnea noted, on 4 L/min  O2. ?Cardiovascular system: normal S1/S2, RRR, no pedal edema.   ?Central nervous system: A&O x3. no gross focal neurologic deficits, normal speech ?Extremities: moves all, no edema, normal tone ?Skin: dry, intact, normal temperature ?Psychiatry: normal mood, congruent affect, judgement and insight appear normal ? ? ?Data Reviewed: ? ? ?Labs reviewed and notable for glucose 141, BUN 36, calcium 8.4, white count improved 11.5, hemoglobin stable 11.7, procalcitonin improved to 0.39. ? ?Micro data reviewed and notable for repeat blood cultures from 3/10 no growth x2 days. ? ? ? ?Family Communication: Patient's son [name] updated by phone this afternoon ? ?Disposition: ?Status is: Inpatient ?Remains inpatient appropriate because: Severity of illness with ongoing oxygen requirement above baseline and on IV antibiotics and IV steroids ? ? ? Planned Discharge Destination: Home ? ? ? ?Time spent: 40 minutes ? ?Author: ?Ezekiel Slocumb, DO ?01/02/2022 2:03 PM ? ?For on call review [email or web address].  ?

## 2022-01-02 NOTE — Assessment & Plan Note (Addendum)
Blood cultures taken on admission grew Staph epidermidis and staph hemolyticus, very likely contaminants.  Repeat blood cultures from 3/10 are negative x4 days. Bacteremia ruled out at this time.

## 2022-01-02 NOTE — Progress Notes (Signed)
*  PRELIMINARY RESULTS* ?Echocardiogram ?2D Echocardiogram has been performed. ? ?Thomas Trujillo ?01/02/2022, 6:32 PM ?

## 2022-01-03 ENCOUNTER — Ambulatory Visit: Payer: Medicare Other

## 2022-01-03 DIAGNOSIS — J9621 Acute and chronic respiratory failure with hypoxia: Secondary | ICD-10-CM | POA: Diagnosis not present

## 2022-01-03 DIAGNOSIS — J189 Pneumonia, unspecified organism: Secondary | ICD-10-CM | POA: Diagnosis not present

## 2022-01-03 LAB — LEGIONELLA PNEUMOPHILA SEROGP 1 UR AG: L. pneumophila Serogp 1 Ur Ag: NEGATIVE

## 2022-01-03 LAB — CBC
HCT: 37.3 % — ABNORMAL LOW (ref 39.0–52.0)
Hemoglobin: 12.1 g/dL — ABNORMAL LOW (ref 13.0–17.0)
MCH: 30.7 pg (ref 26.0–34.0)
MCHC: 32.4 g/dL (ref 30.0–36.0)
MCV: 94.7 fL (ref 80.0–100.0)
Platelets: 260 10*3/uL (ref 150–400)
RBC: 3.94 MIL/uL — ABNORMAL LOW (ref 4.22–5.81)
RDW: 13.3 % (ref 11.5–15.5)
WBC: 13.1 10*3/uL — ABNORMAL HIGH (ref 4.0–10.5)
nRBC: 0 % (ref 0.0–0.2)

## 2022-01-03 LAB — ECHOCARDIOGRAM COMPLETE
Area-P 1/2: 4.24 cm2
Height: 64 in
S' Lateral: 3.5 cm
Weight: 2398.6 oz

## 2022-01-03 LAB — PROCALCITONIN: Procalcitonin: 0.19 ng/mL

## 2022-01-03 MED ORDER — METHYLPREDNISOLONE SODIUM SUCC 125 MG IJ SOLR
60.0000 mg | INTRAMUSCULAR | Status: DC
  Administered 2022-01-03 – 2022-01-04 (×2): 60 mg via INTRAVENOUS
  Filled 2022-01-03 (×2): qty 2

## 2022-01-03 NOTE — Evaluation (Signed)
Physical Therapy Evaluation ?Patient Details ?Name: Thomas Trujillo ?MRN: [id number] ?DOB: 1946-07-17 ?Today's Date: 01/03/2022 ? ?History of Present Illness ? Thomas Trujillo is a 76 year old male with history of BPH, hyperlipidemia, COPD, history of CAD, prostate cancer, history of NSTEMI, status post CABG x2, history of pneumothorax on the left, who presents emergency department for chief concerns of shortness of breath. ?  ?Clinical Impression ? Pt admitted with above diagnosis. Pt received supine in bed agreeable to PT services. Pt reports being indep at baseline with household and short community distances without AD living alone. Today, pt resting on 3 L/min via [place] with Spo2 at 89-90%. Titrated to 5L/min in prep for OOB activity. Pt indep with bed mobility and transfers to standing ambulating in room with supervision ~30' with decreased bilat step length and slowed cadence per subjective reports. Pt in recliner post session with SPO2 on 5L/min at 88% with increased WOB. Pt educated on PLB with seated rest with return to >/=90% after ~ 1 min with O2 titrated back to 4L/min. All needs in reach. Pt displaying deficits in functional endurance for ambulation distances greater than short household distances however appears safe to d/c home with current mobility status. Pt currently with functional limitations due to the deficits listed below (see PT Problem List). Pt will benefit from skilled PT to increase their independence and safety with mobility to allow discharge to the venue listed below.    ? ?Recommendations for follow up therapy are one component of a multi-disciplinary discharge planning process, led by the attending physician.  Recommendations may be updated based on patient status, additional functional criteria and insurance authorization. ? ?Follow Up Recommendations Home health PT ? ?  ?Assistance Recommended at Discharge PRN  ?Patient can return home with the following ? Help with stairs or ramp for  entrance ? ?  ?Equipment Recommendations None recommended by PT  ?Recommendations for Other Services ?    ?  ?Functional Status Assessment Patient has had a recent decline in their functional status and demonstrates the ability to make significant improvements in function in a reasonable and predictable amount of time.  ? ?  ?Precautions / Restrictions Precautions ?Precautions: Fall ?Restrictions ?Weight Bearing Restrictions: No  ? ?  ? ?Mobility ? Bed Mobility ?Overal bed mobility: Independent ?  ?  ?  ?  ?  ?  ?  ?Patient Response: Cooperative ? ?Transfers ?Overall transfer level: Independent ?  ?  ?  ?  ?  ?  ?  ?  ?  ?  ? ?Ambulation/Gait ?Ambulation/Gait assistance: Supervision ?Gait Distance (Feet): 30 Feet ?Assistive device: None ?Gait Pattern/deviations: Decreased step length - right, Decreased step length - left ?Gait velocity: decreased ?  ?  ?General Gait Details: Reciprocal gait with slowed cadence but maintains balance throughout room ? ?Stairs ?  ?  ?  ?  ?  ? ?Wheelchair Mobility ?  ? ?Modified Rankin (Stroke Patients Only) ?  ? ?  ? ?Balance Overall balance assessment: Needs assistance ?Sitting-balance support: No upper extremity supported, Feet supported ?Sitting balance-Leahy Scale: Good ?  ?  ?Standing balance support: No upper extremity supported ?Standing balance-Leahy Scale: Fair ?  ?  ?  ?  ?  ?  ?  ?  ?  ?  ?  ?  ?   ? ? ? ?Pertinent Vitals/Pain Pain Assessment ?Pain Assessment: No/denies pain  ? ? ?Home Living Family/patient expects to be discharged to:: Private residence ?Living Arrangements: Alone ?  ?  Type of Home: Apartment ?Home Access: Level entry ?  ?  ?  ?Home Layout: One level ?  ?Additional Comments: Pt reports having no DME at home  ?  ?Prior Function Prior Level of Function : Independent/Modified Independent ?  ?  ?  ?  ?  ?  ?  ?ADLs Comments: Groceries delivered to his apartment ?  ? ? ?Hand Dominance  ?   ? ?  ?Extremity/Trunk Assessment  ? Upper Extremity Assessment ?Upper  Extremity Assessment: Overall WFL for tasks assessed ?  ? ?Lower Extremity Assessment ?Lower Extremity Assessment: Generalized weakness ?  ? ?   ?Communication  ? Communication: No difficulties  ?Cognition Arousal/Alertness: Awake/alert ?Behavior During Therapy: [id number] for tasks assessed/performed ?Overall Cognitive Status: Within Functional Limits for tasks assessed ?  ?  ?  ?  ?  ?  ?  ?  ?  ?  ?  ?  ?  ?  ?  ?  ?  ?  ?  ? ?  ?General Comments General comments (skin integrity, edema, etc.): SPO2 desaturated up 88% post ambulation on 5 L/min. Returned to >/=90% with seated rest and PLB ? ?  ?Exercises Other Exercises ?Other Exercises: Role of PT in acute setting, d/c recs, pursed lip breathing  ? ?Assessment/Plan  ?  ?PT Assessment Patient needs continued PT services  ?PT Problem List Decreased strength;Decreased mobility;Decreased activity tolerance;Decreased balance ? ?   ?  ?PT Treatment Interventions     ? ?PT Goals (Current goals can be found in the Care Plan section)  ?Acute Rehab PT Goals ?Patient Stated Goal: improve breathing ?PT Goal Formulation: With patient ?Time For Goal Achievement: 01/17/22 ?Potential to Achieve Goals: Fair ? ?  ?Frequency Min 2X/week ?  ? ? ?Co-evaluation   ?  ?  ?  ?  ? ? ?  ?AM-PAC PT "6 Clicks" Mobility  ?Outcome Measure Help needed turning from your back to your side while in a flat bed without using bedrails?: None ?Help needed moving from lying on your back to sitting on the side of a flat bed without using bedrails?: None ?Help needed moving to and from a bed to a chair (including a wheelchair)?: None ?Help needed standing up from a chair using your arms (e.g., wheelchair or bedside chair)?: A Little ?Help needed to walk in hospital room?: A Little ?Help needed climbing 3-5 steps with a railing? : A Lot ?6 Click Score: 20 ? ?  ?End of Session Equipment Utilized During Treatment: Oxygen ?Activity Tolerance: Treatment limited secondary to medical complications (Comment)  (SOB) ?Patient left: with call bell/phone within reach;in chair ?Nurse Communication: Mobility status ?PT Visit Diagnosis: Muscle weakness (generalized) (M62.81);Difficulty in walking, not elsewhere classified (R26.2) ?  ? ?Time: 4259-5638 ?PT Time Calculation (min) (ACUTE ONLY): 21 min ? ? ?Charges:   PT Evaluation ?$PT Eval Low Complexity: 1 Low ?PT Treatments ?$Therapeutic Activity: 8-22 mins ?  ?   ? ? ?Salem Caster. Fairly IV, PT, DPT ?Physical Therapist- [place]  ?[facility]  ?01/03/2022, 2:12 PM ? ?

## 2022-01-03 NOTE — Progress Notes (Signed)
?Progress Note ? ? ?PatientDom Trujillo EXN:[id number] DOB: 1946-09-18 DOA: 01/03/2022     3 ?DOS: the patient was seen and examined on 01/03/2022 ?  ?Brief hospital course: ?Thomas Trujillo is a 76 year old male with history of BPH, hyperlipidemia, advanced COPD on 2 L/min home oxygen with activity only, history of CAD, prostate cancer soon to start on radiation therapy, history of NSTEMI, status post CABG x2, history of pneumothorax on the left, who presented to the ED on 01/04/2022 for evaluation of fevers and shortness of breath. ? ?Evaluation of the ED revealed community-acquired pneumonia complicated by acute respiratory failure with hypoxia requiring as much as 6 to 7 L/min oxygen. ? ?Admitted to the hospital started on empiric Rocephin and Zithromax. ? ?Assessment and Plan: ?* Community acquired pneumonia ?Complicated by acute on chronic hypoxic respiratory failure.  Patient presented with worsening shortness of breath, fevers and cough.  Imaging showed right-sided pneumonia. ?-- Continue IV Rocephin and Zithromax ?-- Supplemental oxygen and wean as tolerated ?-- Scheduled Mucinex ?-- As needed antitussives ?-- Incentive spirometer and flutter valve ?-- Sputum culture pending ? ?Sepsis ([place]) ?Sepsis present on admission as evidenced by tachycardia and leukocytosis in the setting of pneumonia.  No evidence of organ dysfunction to suggest severe sepsis or septic shock.   ?Sepsis physiology improving. ?-- Continue antibiotics and follow cultures ?-- Monitor hemodynamics closely ? ?Acute on chronic respiratory failure with hypoxia (HCC) ?Baseline oxygen requirement 2 L/min with exertion.  On admission, required as much as 6 to 7 L/min to maintain sats in the setting of pneumonia with symptomatic shortness of breath and cough. ?-- Supplemental O2 as needed to maintain sats 88 to 93% ?-- Wean O2 as tolerated ?-- Treat pneumonia as outlined ?-- Check ambulatory O2 requirements ?--Out of bed to chair and mobilize as  tolerated ? ?3/13: Remains on 4 L/min supplemental O2 ? ?Chronic respiratory failure with hypoxia (HCC) ?Baseline oxygen requirement 2 L/min with activity, due to advanced COPD. ? ?Chronic obstructive pulmonary disease ([place]) ?Patient has advanced COPD follows with Dr. Raul Del.  On 2 L/min oxygen with exertion at home.  Initially had no expiratory wheezing on exam to suggest exacerbation. ?3/12: Improved air movement no expiratory wheezes on exam today, remains on 4 L/min oxygen ?-- Continue Solu-Medrol at current dose, transition to prednisone tomorrow if further improvement ?-- Continue home Pulmicort nebs and as needed albuterol nebs ?--Add scheduled DuoNebs ?-- Follow-up with pulmonology outpatient as scheduled ?-- Mucolytic's ? ?Chest pain, non-cardiac ?Musculoskeletal etiology in the setting of coughing fits due to pneumonia.  Supportive care. ? ?Elevated troponin ?Due to demand ischemia in the setting of acute on chronic respiratory failure with hypoxia in the setting of pneumonia.  Patient has no chest pain and troponin trend was flat. ? ?Coronary artery disease involving native coronary artery of native heart without angina pectoris ?Chronic and stable.  Patient without chest pain.  Continue aspirin, Lipitor. ? ?Hyperlipidemia LDL goal <70 ?Continue Lipitor ? ?Prostate cancer ([place]) ?No acute issues.  Reportedly soon to start on radiation therapy.  When he presented for his initial treatment he was found to be febrile resulting in this admission for pneumonia. ?-- Follow-up with urology, oncology, radiation oncology as scheduled ? ?Positive blood culture ?Blood cultures taken on admission grew Staph epidermidis and staph hemolyticus, very likely contaminants.  Repeat blood cultures from 3/10 are negative x3 days. ?-- Follow repeat cultures ? ?Bacteremia ruled out ? ? ? ? ?  ? ?Subjective:  Pt awake  sitting up in bed when seen today.  He reports feeling slightly better.  Has ongoing cough, but reports  producing some dark phlegm at times, 1 episode of blood-tinged sputum.  Has ongoing hand tremor but no full body shakes or fevers chills.  No other acute complaints. ? ?Physical Exam: ?Vitals:  ? 01/03/22 0423 01/03/22 0723 01/03/22 0727 01/03/22 1203  ?BP: 129/65  125/64   ?Pulse: 97  95 96  ?Resp: '20  18 18  '$ ?Temp: 98.1 ?F (36.7 ?C)  97.9 ?F (36.6 ?C)   ?TempSrc:   Oral   ?SpO2: 97% 92% 96% 96%  ?Weight:      ?Height:      ? ?General exam: awake, alert, no acute distress ?HEENT: Nasal cannula in place, moist mucus membranes, hearing grossly normal  ?Respiratory system: Improved aeration, but with expiratory wheezes and rhonchi heard throughout, improved but still mildly increased respiratory effort, on 4 L/min [place] O2 ?Cardiovascular system: normal S1/S2, RRR, no pedal edema.   ?Central nervous system: A&O x3. no gross focal neurologic deficits, normal speech ?Extremities: Moves all, no edema, normal tone ?Skin: dry, intact, normal temperature ?Psychiatry: normal mood, congruent affect, judgement and insight appear normal ? ? ? ?Data Reviewed: ? ?Labs reviewed and notable for white count 13.1, hemoglobin stable 12.1, procalcitonin downtrending 0.19 ? ?Echocardiogram reviewed, impression: ? 1. Left ventricular ejection fraction, by estimation, is 55 to 60%. The  ?left ventricle has normal function. Left ventricular endocardial border  ?not optimally defined to evaluate regional wall motion. Left ventricular  ?diastolic parameters were normal.  ? 2. Right ventricular systolic function is normal. The right ventricular  ?size is normal. Tricuspid regurgitation signal is inadequate for assessing  ?PA pressure.  ? 3. The mitral valve is normal in structure. No evidence of mitral valve  ?regurgitation. No evidence of mitral stenosis.  ? 4. The aortic valve is normal in structure. Aortic valve regurgitation is  ?not visualized. No aortic stenosis is present.  ? 5. The inferior vena cava is normal in size with greater than  50%  ?respiratory variability, suggesting right atrial pressure of 3 mmHg.  ? ? ?Family Communication: Patient's son Thomas Trujillo updated by phone ? ?Disposition: ?Status is: Inpatient ?Remains inpatient appropriate because: Remains on IV antibiotic and IV steroid, ongoing hypoxia with oxygen requirement remains well above baseline. ? ? ? Planned Discharge Destination: Home ? ? ? ?Time spent: 35 minutes ? ?Author: ?Ezekiel Slocumb, DO ?01/03/2022 1:51 PM ? ?For on call review [email or web address].  ?

## 2022-01-03 NOTE — Plan of Care (Signed)
  Problem: Education: Goal: Knowledge of disease or condition will improve Outcome: Progressing Goal: Knowledge of the prescribed therapeutic regimen will improve Outcome: Progressing   Problem: Activity: Goal: Ability to tolerate increased activity will improve Outcome: Progressing   

## 2022-01-03 NOTE — Care Management Important Message (Signed)
Important Message ? ?Patient Details  ?Name: Thomas Trujillo ?MRN: [id number] ?Date of Birth: 26-Jun-1946 ? ? ?Medicare Important Message Given:  Yes ? ? ? ? ?Dannette Barbara ?01/03/2022, 11:37 AM ?

## 2022-01-04 ENCOUNTER — Inpatient Hospital Stay: Payer: Medicare Other

## 2022-01-04 ENCOUNTER — Ambulatory Visit: Payer: Medicare Other

## 2022-01-04 ENCOUNTER — Encounter: Payer: Self-pay | Admitting: Internal Medicine

## 2022-01-04 DIAGNOSIS — J189 Pneumonia, unspecified organism: Secondary | ICD-10-CM | POA: Diagnosis not present

## 2022-01-04 DIAGNOSIS — J9621 Acute and chronic respiratory failure with hypoxia: Secondary | ICD-10-CM | POA: Diagnosis not present

## 2022-01-04 LAB — PROCALCITONIN: Procalcitonin: 0.14 ng/mL

## 2022-01-04 LAB — CBC
HCT: 36.9 % — ABNORMAL LOW (ref 39.0–52.0)
Hemoglobin: 12 g/dL — ABNORMAL LOW (ref 13.0–17.0)
MCH: 30.6 pg (ref 26.0–34.0)
MCHC: 32.5 g/dL (ref 30.0–36.0)
MCV: 94.1 fL (ref 80.0–100.0)
Platelets: 277 10*3/uL (ref 150–400)
RBC: 3.92 MIL/uL — ABNORMAL LOW (ref 4.22–5.81)
RDW: 13.6 % (ref 11.5–15.5)
WBC: 16.6 10*3/uL — ABNORMAL HIGH (ref 4.0–10.5)
nRBC: 0 % (ref 0.0–0.2)

## 2022-01-04 LAB — D-DIMER, QUANTITATIVE: D-Dimer, Quant: 2.66 ug/mL-FEU — ABNORMAL HIGH (ref 0.00–0.50)

## 2022-01-04 MED ORDER — IOHEXOL 350 MG/ML SOLN
75.0000 mL | Freq: Once | INTRAVENOUS | Status: AC | PRN
  Administered 2022-01-04: 75 mL via INTRAVENOUS

## 2022-01-04 MED ORDER — IPRATROPIUM-ALBUTEROL 0.5-2.5 (3) MG/3ML IN SOLN
3.0000 mL | Freq: Four times a day (QID) | RESPIRATORY_TRACT | Status: DC
  Administered 2022-01-04 – 2022-01-05 (×5): 3 mL via RESPIRATORY_TRACT
  Filled 2022-01-04 (×5): qty 3

## 2022-01-04 NOTE — Progress Notes (Addendum)
?Progress Note ? ? ?PatientBiff Trujillo LKG:[id number] DOB: 08-Apr-1946 DOA: 01/02/2022     4 ?DOS: the patient was seen and examined on 01/04/2022 ?  ?Brief hospital course: ?Thomas Trujillo is a 76 year old male with history of BPH, hyperlipidemia, advanced COPD on 2 L/min home oxygen with activity only, history of CAD, prostate cancer soon to start on radiation therapy, history of NSTEMI, status post CABG x2, history of pneumothorax on the left, who presented to the ED on 12/26/2021 for evaluation of fevers and shortness of breath. ? ?Evaluation of the ED revealed community-acquired pneumonia complicated by acute respiratory failure with hypoxia requiring as much as 6 to 7 L/min oxygen. ? ?Admitted to the hospital started on empiric Rocephin and Zithromax. ? ?3/14: recurrent chills, ongoing dyspnea, still needing 3-5 L/min O2 ? ?Assessment and Plan: ?* Community acquired pneumonia ?Complicated by acute on chronic hypoxic respiratory failure.  Patient presented with worsening shortness of breath, fevers and cough.  Chest xray showed dense RUL pneumonia (vs ?underlying malignancy) and emphysema. ?-- Continue IV Rocephin and Zithromax ?-- Supplemental oxygen and wean as tolerated ?-- Scheduled Mucinex ?-- Steroids & neb treatments as outlined ?-- As needed antitussives ?-- Incentive spirometer and flutter valve ?-- Sputum culture pending ?-- CT vs CTA chest pending D-dimer  ?-- Pulmonology consult ?-- Repeat imaging at 6-8 weeks to ensure resolution ? ?Sepsis ([place]) ?Sepsis present on admission as evidenced by tachycardia and leukocytosis in the setting of pneumonia.  No evidence of organ dysfunction to suggest severe sepsis or septic shock.   ?Sepsis physiology improving. ?-- Continue antibiotics and follow cultures ?-- Monitor hemodynamics closely ? ?Acute on chronic respiratory failure with hypoxia (HCC) ?Baseline oxygen requirement 2 L/min with exertion.  On admission, required as much as 6 to 7 L/min to maintain sats  in the setting of pneumonia with symptomatic shortness of breath and cough. ?-- Supplemental O2 as needed to maintain sats 88 to 93% ?-- Wean O2 as tolerated ?-- Treat pneumonia as outlined ?-- Repeat CXR ?-- D-dimer and procal - pending ?-- CT chest (CTA if dimer elevated) for further evaluation ?-- Pulmonology consult ?-- IS & flutter / pulmonary hygiene ?--Out of bed to chair and mobilize as tolerated ? ?3/14: On 3 L/min O2 at rest with spO2 low 90's, 5 L/min w exertion ? ?Chronic respiratory failure with hypoxia (HCC) ?Baseline oxygen requirement 2 L/min with activity, due to advanced COPD. ? ?COPD with acute exacerbation ([place]) ?Patient has advanced COPD follows with Dr. Raul Del.  On 2 L/min oxygen with exertion at home.  Initially had no expiratory wheezing on exam to suggest exacerbation. ?3/12: Improved air movement no expiratory wheezes on exam, on 4 L/min oxygen ?3/13: significant wheezing, on steroids ?3/14: persistent wheezing and poor aeration ?-- Continue IV Solu-Medrol  ?-- On home Pulmicort nebs  ?-- PRN albuterol nebs ?--Scheduled DuoNebs ?-- Mucolytic's ?-- Follow up with Pulmonology ? ?Chest pain, non-cardiac ?Musculoskeletal etiology in the setting of coughing fits due to pneumonia.  Supportive care. ? ?Elevated troponin ?Due to demand ischemia in the setting of acute on chronic respiratory failure with hypoxia in the setting of pneumonia.  Patient has no chest pain and troponin trend was flat. ? ?Coronary artery disease involving native coronary artery of native heart without angina pectoris ?Chronic and stable.  Patient without chest pain.  Continue aspirin, Lipitor. ? ?Hyperlipidemia LDL goal <70 ?Continue Lipitor ? ?Prostate cancer ([place]) ?No acute issues.  Reportedly soon to start on radiation therapy.  When he presented for his initial treatment he was found to be febrile resulting in this admission for pneumonia. ?-- Follow-up with urology, oncology, radiation oncology as  scheduled ? ?Positive blood culture ?Blood cultures taken on admission grew Staph epidermidis and staph hemolyticus, very likely contaminants.  Repeat blood cultures from 3/10 are negative x4 days. ?Bacteremia ruled out at this time. ? ? ? ? ? ?  ? ?Subjective: Pt awake resting in bed when seen today.  Reports recurrence of shaking chills today.  Cough and SOB continues.  Feels very poorly, fatigued.  Does not feel up to working with PT this afternoon.   ? ?Physical Exam: ?Vitals:  ? 01/04/22 0448 01/04/22 0727 01/04/22 1406 01/04/22 1550  ?BP: 102/60 (!) 100/50  (!) 108/53  ?Pulse: 66 76  98  ?Resp: '18 19  18  '$ ?Temp: 98.6 ?F (37 ?C) 99 ?F (37.2 ?C)  99.9 ?F (37.7 ?C)  ?TempSrc:      ?SpO2: 94% 92% 94% 94%  ?Weight:      ?Height:      ? ?General exam: awake, alert, no acute distress, mildly ill-appearing ?HEENT: nasal cannula in place, moist mucus membranes, hearing grossly normal  ?Respiratory system: diffuse wheezes and rhonchi, increased respiratory effort at rest with accessory muscle use, on 3 L/min [place] O2 ?Cardiovascular system: normal S1/S2, RRR, no JVD, murmurs, rubs, gallops, no pedal edema.   ?Central nervous system: A&O x3. no gross focal neurologic deficits, normal speech ?Extremities: moves all, no edema, normal tone ?Skin: dry, intact, normal temperature ?Psychiatry: normal mood, congruent affect, judgement and insight appear normal ? ? ?Data Reviewed: ? ?Labs reviewed and notable for WBC 16.6, Hbg stable 12.0,  ? ?Family Communication: Spoke with son, Thomas Trujillo, by phone this afternoon.  Please call daily with updates.  Thomas Trujillo in patient' surrogate if needed. ? ?Disposition: ?Status is: Inpatient ?Remains inpatient appropriate because: remains on IV antibiotics and IV steroids with persistent acute on chronic hypoxic respiratory failure and O2 needs above baseline.  Significantly short of breath ? ? ? ? Planned Discharge Destination: Home with Home Health ? ? ? ?Time spent: 45 minutes ? ?Author: ?Ezekiel Slocumb, DO ?01/04/2022 4:23 PM ? ?For on call review [email or web address].  ?

## 2022-01-04 NOTE — TOC Initial Note (Signed)
Transition of Care (TOC) - Initial/Assessment Note  ? ? ?Patient Details  ?Name: Thomas Trujillo ?MRN: [id number] ?Date of Birth: 27-Jan-1946 ? ?Transition of Care (TOC) CM/SW Contact:    ?Beverly Sessions, RN ?Phone Number: ?01/04/2022, 1:31 PM ? ?Clinical Narrative:                 ? ?Admitted for: PNA ?Admitted from: Home alone ?[id number], ?Current home health/prior home health/DME: Home O2 with adapt at 2 L.  Patient states with exertion he bumps it up to 3-4 L ? ?Therapy recommending home health.  Patient agreeable.  States he does not have a preference of home health agency.  Referral [facility] to [facility] with [facility].  ? ?Patient states he may need transport at discharge.  If he is unable to obtain a ride, cab with O2 (has portable tank in the room) vs EMS with O2.  At this time he does not have medical need for EMS ? ?Expected Discharge Plan: [place] ?Barriers to Discharge: Continued Medical Work up ? ? ?Patient Goals and CMS Choice ?  ?CMS Medicare.gov Compare Post Acute Care list provided to:: Patient ?Choice offered to / list presented to : Patient ? ?Expected Discharge Plan and Services ?Expected Discharge Plan: [place] ?  ?Discharge Planning Services: CM Consult ?Post Acute Care Choice: Home Health ?Living arrangements for the past 2 months: [place] ?                ?  ?  ?  ?  ?  ?HH Arranged: Therapist, sports, PT ?[place] Agency: [place] ?Date HH Agency Contacted: 01/04/22 ?  ?Representative spoke with at [place]: Malachy Mood ? ?Prior Living Arrangements/Services ?Living arrangements for the past 2 months: [place] ?Lives with:: Self ?Patient language and need for interpreter reviewed:: Yes ?Do you feel safe going back to the place where you live?: Yes      ?Need for Family Participation in Patient Care: Yes (Comment) ?Care giver support system in place?: Yes (comment) ?Current home services: DME ?Criminal Activity/Legal Involvement Pertinent to  Current Situation/Hospitalization: No - Comment as needed ? ?Activities of Daily Living ?Home Assistive Devices/Equipment: Oxygen, Dentures (specify type), Eyeglasses, Nebulizer ?ADL Screening (condition at time of admission) ?Patient's cognitive ability adequate to safely complete daily activities?: Yes ?Is the patient deaf or have difficulty hearing?: No ?Does the patient have difficulty seeing, even when wearing glasses/contacts?: No ?Does the patient have difficulty concentrating, remembering, or making decisions?: No ?Patient able to express need for assistance with ADLs?: Yes ?Does the patient have difficulty dressing or bathing?: No ?Independently performs ADLs?: Yes (appropriate for developmental age) ?Does the patient have difficulty walking or climbing stairs?: No ?Weakness of Legs: None ?Weakness of Arms/Hands: None ? ?Permission Sought/Granted ?  ?  ?   ?   ?   ?   ? ?Emotional Assessment ?  ?  ?  ?Orientation: : Oriented to Self, Oriented to Place, Oriented to  Time, Oriented to Situation ?Alcohol / Substance Use: Not Applicable ?Psych Involvement: No (comment) ? ?Admission diagnosis:  Shortness of breath [R06.02] ?Community acquired pneumonia [J18.9] ?Community acquired pneumonia of right lung, unspecified part of lung [J18.9] ?Patient Active Problem List  ? Diagnosis Date Noted  ? Positive blood culture 01/02/2022  ? Acute on chronic respiratory failure with hypoxia ([place]) 12/31/2021  ? Prostate cancer ([place]) 12/31/2021  ? Chest pain, non-cardiac 12/31/2021  ? Elevated troponin 12/31/2021  ?  Sepsis ([place]) 12/31/2021  ? Community acquired pneumonia 12/27/2021  ? Chronic respiratory failure with hypoxia ([place]) 03/08/2019  ? Pneumothorax on left 11/18/2017  ? Dyspnea on exertion 09/14/2017  ? Chronic obstructive pulmonary disease ([place]) 06/07/2017  ? Pneumothorax 05/30/2017  ? Ischemic cardiomyopathy 04/05/2017  ? Hyperlipidemia LDL goal <70 04/05/2017  ? Coronary artery disease involving native coronary  artery of native heart without angina pectoris 02/24/2017  ? S/P CABG x 2 02/14/2017  ? NSTEMI (non-ST elevated myocardial infarction) ([place]) 02/13/2017  ? ?PCP:  [name], MD ?Pharmacy:   ?[id number] [place] [address]?Phone: [phone]: [phone]? ? ? ? ?Social Determinants of Health (SDOH) Interventions ?  ? ?Readmission Risk Interventions ?No flowsheet data found. ? ? ?

## 2022-01-04 NOTE — Progress Notes (Signed)
PT Cancellation Note ? ?Patient Details ?Name: Thomas Trujillo ?MRN: [id number] ?DOB: Apr 26, 1946 ? ? ?Cancelled Treatment:    Reason Eval/Treat Not Completed: Patient declined, no reason specified. Upon entry to room, pt declining PT services. Requesting rest and PT to attempt at later time. Will re-attempt as able. ? ? ?Salem Caster. Fairly IV, PT, DPT ?Physical Therapist- [place]  ?[facility]  ?01/04/2022, 11:49 AM ?

## 2022-01-05 ENCOUNTER — Encounter: Payer: Self-pay | Admitting: Internal Medicine

## 2022-01-05 ENCOUNTER — Ambulatory Visit: Payer: Medicare Other

## 2022-01-05 DIAGNOSIS — J441 Chronic obstructive pulmonary disease with (acute) exacerbation: Secondary | ICD-10-CM | POA: Diagnosis not present

## 2022-01-05 DIAGNOSIS — J9621 Acute and chronic respiratory failure with hypoxia: Secondary | ICD-10-CM | POA: Diagnosis not present

## 2022-01-05 DIAGNOSIS — J189 Pneumonia, unspecified organism: Secondary | ICD-10-CM | POA: Diagnosis not present

## 2022-01-05 DIAGNOSIS — A419 Sepsis, unspecified organism: Secondary | ICD-10-CM | POA: Diagnosis not present

## 2022-01-05 LAB — CBC
HCT: 38.1 % — ABNORMAL LOW (ref 39.0–52.0)
Hemoglobin: 12.3 g/dL — ABNORMAL LOW (ref 13.0–17.0)
MCH: 30.5 pg (ref 26.0–34.0)
MCHC: 32.3 g/dL (ref 30.0–36.0)
MCV: 94.5 fL (ref 80.0–100.0)
Platelets: 255 10*3/uL (ref 150–400)
RBC: 4.03 MIL/uL — ABNORMAL LOW (ref 4.22–5.81)
RDW: 13.8 % (ref 11.5–15.5)
WBC: 21.9 10*3/uL — ABNORMAL HIGH (ref 4.0–10.5)
nRBC: 0 % (ref 0.0–0.2)

## 2022-01-05 LAB — CULTURE, BLOOD (ROUTINE X 2)
Culture: NO GROWTH
Culture: NO GROWTH
Special Requests: ADEQUATE
Special Requests: ADEQUATE

## 2022-01-05 LAB — BLOOD GAS, ARTERIAL
Acid-Base Excess: 5.8 mmol/L — ABNORMAL HIGH (ref 0.0–2.0)
Bicarbonate: 29.8 mmol/L — ABNORMAL HIGH (ref 20.0–28.0)
O2 Content: 4 L/min
O2 Saturation: 96.8 %
Patient temperature: 37
pCO2 arterial: 40 mmHg (ref 32–48)
pH, Arterial: 7.48 — ABNORMAL HIGH (ref 7.35–7.45)
pO2, Arterial: 73 mmHg — ABNORMAL LOW (ref 83–108)

## 2022-01-05 MED ORDER — METHYLPREDNISOLONE SODIUM SUCC 40 MG IJ SOLR
40.0000 mg | INTRAMUSCULAR | Status: DC
  Administered 2022-01-05 – 2022-01-07 (×3): 40 mg via INTRAVENOUS
  Filled 2022-01-05 (×3): qty 1

## 2022-01-05 MED ORDER — SODIUM CHLORIDE 0.9 % IV SOLN
1.0000 g | INTRAVENOUS | Status: AC
  Administered 2022-01-05 – 2022-01-06 (×2): 1 g via INTRAVENOUS
  Filled 2022-01-05 (×2): qty 1

## 2022-01-05 MED ORDER — AZITHROMYCIN 250 MG PO TABS
500.0000 mg | ORAL_TABLET | Freq: Every day | ORAL | Status: AC
Start: 2022-01-05 — End: 2022-01-06
  Administered 2022-01-05 – 2022-01-06 (×2): 500 mg via ORAL
  Filled 2022-01-05 (×2): qty 2

## 2022-01-05 MED ORDER — IPRATROPIUM-ALBUTEROL 0.5-2.5 (3) MG/3ML IN SOLN
3.0000 mL | RESPIRATORY_TRACT | Status: DC
Start: 2022-01-05 — End: 2022-01-07
  Administered 2022-01-05 – 2022-01-06 (×7): 3 mL via RESPIRATORY_TRACT
  Filled 2022-01-05 (×5): qty 3

## 2022-01-05 NOTE — Progress Notes (Addendum)
Patient to room transferred from [facility]. Currently on bipap. Tachypneic , Sats 95-96%. Patient alert and gestures needs at this time due to sob on exertion. Respiratory here to give patient's breathing treatment which he is requesting the handheld instead of through his bipap. Vitals stable at this time. Placed on telemetry monitor. CCMD called and verified. Urinal within reach. Call bell within reach.  ? ?Paged NP to check to see if she wants patient on telemetry. No current telemetry orders noted.  ?

## 2022-01-05 NOTE — Progress Notes (Addendum)
Progress Note    Ranjit Ganey  NWG:[id number] DOB: January 04, 1946  DOA: 12/30/2021 PCP: Marina Goodell, MD      Brief Narrative:    Medical records reviewed and are as summarized below:   Hamdaan Koegler is a 76 year old male with history of BPH, hyperlipidemia, advanced COPD on 2 L/min home oxygen with activity only, history of CAD, prostate cancer soon to start on radiation therapy, history of NSTEMI, status post CABG x2, history of pneumothorax on the left, who presented to the ED on 12/30/2021 for evaluation of fevers and shortness of breath.  Evaluation in the ED revealed severe sepsis secondary to community-acquired pneumonia complicated by acute respiratory failure with hypoxia requiring as much as 6 to 7 L/min oxygen.  He was treated with empiric IV Rocephin and azithromycin.        Assessment/Plan:   Principal Problem:   Severe sepsis (HCC) Active Problems:   Community acquired pneumonia   Acute on chronic respiratory failure with hypoxia (HCC)   COPD with acute exacerbation (HCC)   Chronic respiratory failure with hypoxia (HCC)   Chest pain, non-cardiac   Coronary artery disease involving native coronary artery of native heart without angina pectoris   Elevated troponin   Hyperlipidemia LDL goal <70   Prostate cancer (HCC)   Positive blood culture    Body mass index is 25.73 kg/m.   Severe sepsis secondary to community-acquired pneumonia: CT chest on 01/04/2022 showed severe right upper lobar pneumonia.  Restart IV ceftriaxone and oral azithromycin for 2 more days to complete 7 days of treatment.  No evidence of pulmonary embolism.  Acute on chronic hypoxic respiratory failure: He is on 4 L/min oxygen at rest.  He uses 2 L oxygen at home and sometimes goes up to 4 L oxygen with activity.  Taper down oxygen as able.  COPD exacerbation: Continue IV steroids, bronchodilators and antitussives as needed.  Leukocytosis: This is likely from steroids.   Monitor CBC  Elevated troponin: This is likely from demand ischemia.  Staph epidermidis and staph hemolyticus bacteremia: This is likely from contamination.  Repeat blood cultures from 12/31/2021 were negative.  Prostate cancer: Continue Flomax.  He is supposed to start radiation therapy in the outpatient setting.  Follow-up with urologist, oncologist and radiation oncologist.  Elevated troponin: This is likely from demand ischemia. Other comorbidities include hyperlipidemia, CAD  Case discussed with Dr. Karna Christmas, pulmonologist.  He said patient's prognosis is poor. Patient wants to be DNR  Palliative care team has been consulted. I confirmed DNR status with patient.   Diet Order             Diet Heart Room service appropriate? Yes; Fluid consistency: Thin  Diet effective now                       Consultants: None  Procedures: None    Medications:    aspirin EC  81 mg Oral Daily   atorvastatin  40 mg Oral Daily   azithromycin  500 mg Oral Daily   budesonide (PULMICORT) nebulizer solution  0.25 mg Nebulization BID   enoxaparin (LOVENOX) injection  40 mg Subcutaneous Q24H   ferrous sulfate  325 mg Oral Q breakfast   ipratropium-albuterol  3 mL Nebulization Q6H   methylPREDNISolone (SOLU-MEDROL) injection  60 mg Intravenous Q24H   montelukast  10 mg Oral QHS   multivitamin with minerals   Oral Daily   tamsulosin  0.4 mg  Oral Daily   Continuous Infusions:  sodium chloride Stopped (01/02/22 2225)   cefTRIAXone (ROCEPHIN)  IV 1 g (01/05/22 1029)     Anti-infectives (From admission, onward)    Start     Dose/Rate Route Frequency Ordered Stop   01/05/22 1015  azithromycin (ZITHROMAX) tablet 500 mg        500 mg Oral Daily 01/05/22 0923 01/07/22 0959   01/05/22 1000  cefTRIAXone (ROCEPHIN) 1 g in sodium chloride 0.9 % 100 mL IVPB        1 g 200 mL/hr over 30 Minutes Intravenous Every 24 hours 01/05/22 0923 01/07/22 0959   12/31/21 2200  azithromycin  (ZITHROMAX) 500 mg in sodium chloride 0.9 % 250 mL IVPB  Status:  Discontinued        500 mg 250 mL/hr over 60 Minutes Intravenous Every 24 hours 2022-01-09 2035 12/31/21 1432   12/31/21 2200  cefTRIAXone (ROCEPHIN) 2 g in sodium chloride 0.9 % 100 mL IVPB        2 g 200 mL/hr over 30 Minutes Intravenous Every 24 hours Jan 09, 2022 2035 01/03/22 2051   12/31/21 2200  azithromycin (ZITHROMAX) tablet 500 mg        500 mg Oral Daily at bedtime 12/31/21 1432 01/03/22 2007   Jan 09, 2022 2045  cefTRIAXone (ROCEPHIN) 1 g in sodium chloride 0.9 % 100 mL IVPB  Status:  Discontinued       Note to Pharmacy: Please total to 2 g IVPB today, thank you.   1 g 200 mL/hr over 30 Minutes Intravenous  Once Jan 09, 2022 2034 2022/01/09 2039   01/09/2022 2015  azithromycin (ZITHROMAX) 500 mg in sodium chloride 0.9 % 250 mL IVPB        500 mg 250 mL/hr over 60 Minutes Intravenous  Once 01-09-2022 2009 01-09-22 2231   2022-01-09 2015  cefTRIAXone (ROCEPHIN) 1 g in sodium chloride 0.9 % 100 mL IVPB        1 g 200 mL/hr over 30 Minutes Intravenous  Once 09-Jan-2022 2009 January 09, 2022 2052              Family Communication/Anticipated D/C date and plan/Code Status   DVT prophylaxis: enoxaparin (LOVENOX) injection 40 mg Start: Jan 09, 2022 2200 Place TED hose Start: 09-Jan-2022 2032     Code Status: Full Code  Family Communication: None Disposition Plan: Plan to discharge home tomorrow   Status is: Inpatient Remains inpatient appropriate because: IV antibiotics       Subjective:   Interval events noted.  He complains of cough productive of yellowish sputum and shortness of breath.  Objective:    Vitals:   01/05/22 0355 01/05/22 0405 01/05/22 0726 01/05/22 0729  BP: 112/64  117/67   Pulse: 95  100   Resp: 20  18   Temp: 98.2 F (36.8 C)  98.3 F (36.8 C)   TempSrc:   Oral   SpO2: 97% 91% 94% 94%  Weight:      Height:       No data found.   Intake/Output Summary (Last 24 hours) at 01/05/2022 1222 Last data  filed at 01/05/2022 1028 Gross per 24 hour  Intake 240 ml  Output 1100 ml  Net -860 ml   Filed Weights   2022/01/09 1912  Weight: 68 kg    Exam:  GEN: NAD SKIN: Warm and dry EYES: EOMI ENT: MMM CV: RRR PULM: Decreased air entry bilaterally. No wheezing ABD: soft, ND, NT, +BS CNS: AAO x 3, non focal EXT: No edema or  tenderness        Data Reviewed:   I have personally reviewed following labs and imaging studies:  Labs: Labs show the following:   Basic Metabolic Panel: Recent Labs  Lab Jan 05, 2022 1916 12/31/21 0507 01/01/22 0616 01/02/22 0453  NA 135 140 138 140  K 3.5 4.1 4.2 4.1  CL 99 107 104 105  CO2 24 25 27 27   GLUCOSE 145* 150* 115* 141*  BUN 21 22 37* 36*  CREATININE 1.28* 1.01 1.19 0.99  CALCIUM 8.8* 8.4* 8.5* 8.4*  MG  --   --  2.4  --    GFR Estimated Creatinine Clearance: 54 mL/min (by C-G formula based on SCr of 0.99 mg/dL). Liver Function Tests: No results for input(s): AST, ALT, ALKPHOS, BILITOT, PROT, ALBUMIN in the last 168 hours. No results for input(s): LIPASE, AMYLASE in the last 168 hours. No results for input(s): AMMONIA in the last 168 hours. Coagulation profile No results for input(s): INR, PROTIME in the last 168 hours.  CBC: Recent Labs  Lab January 05, 2022 1916 12/31/21 0507 01/01/22 0616 01/02/22 0453 01/03/22 0259 01/04/22 0407 01/05/22 0441  WBC 11.6*   < > 14.5* 11.5* 13.1* 16.6* 21.9*  NEUTROABS 9.3*  --   --   --   --   --   --   HGB 13.4   < > 11.5* 11.7* 12.1* 12.0* 12.3*  HCT 40.0   < > 35.4* 36.1* 37.3* 36.9* 38.1*  MCV 91.7   < > 94.9 95.0 94.7 94.1 94.5  PLT 217   < > 220 253 260 277 255   < > = values in this interval not displayed.   Cardiac Enzymes: No results for input(s): CKTOTAL, CKMB, CKMBINDEX, TROPONINI in the last 168 hours. BNP (last 3 results) No results for input(s): PROBNP in the last 8760 hours. CBG: No results for input(s): GLUCAP in the last 168 hours. D-Dimer: Recent Labs     01/04/22 1640  DDIMER 2.66*   Hgb A1c: No results for input(s): HGBA1C in the last 72 hours. Lipid Profile: No results for input(s): CHOL, HDL, LDLCALC, TRIG, CHOLHDL, LDLDIRECT in the last 72 hours. Thyroid function studies: No results for input(s): TSH, T4TOTAL, T3FREE, THYROIDAB in the last 72 hours.  Invalid input(s): FREET3 Anemia work up: No results for input(s): VITAMINB12, FOLATE, FERRITIN, TIBC, IRON, RETICCTPCT in the last 72 hours. Sepsis Labs: Recent Labs  Lab 05-Jan-2022 2258 12/31/21 0507 12/31/21 1437 01/01/22 0616 01/02/22 0453 01/03/22 0259 01/04/22 0407 01/04/22 1640 01/05/22 0441  PROCALCITON  --   --   --  0.59 0.39 0.19  --  0.14  --   WBC  --    < >  --  14.5* 11.5* 13.1* 16.6*  --  21.9*  LATICACIDVEN 0.9  --  0.9  --   --   --   --   --   --    < > = values in this interval not displayed.    Microbiology Recent Results (from the past 240 hour(s))  Resp Panel by RT-PCR (Flu A&B, Covid) Nasopharyngeal Swab     Status: None   Collection Time: 2022/01/05  7:16 PM   Specimen: Nasopharyngeal Swab; Nasopharyngeal(NP) swabs in vial transport medium  Result Value Ref Range Status   SARS Coronavirus 2 by RT PCR NEGATIVE NEGATIVE Final    Comment: (NOTE) SARS-CoV-2 target nucleic acids are NOT DETECTED.  The SARS-CoV-2 RNA is generally detectable in upper respiratory specimens during the acute phase of  infection. The lowest concentration of SARS-CoV-2 viral copies this assay can detect is 138 copies/mL. A negative result does not preclude SARS-Cov-2 infection and should not be used as the sole basis for treatment or other patient management decisions. A negative result may occur with  improper specimen collection/handling, submission of specimen other than nasopharyngeal swab, presence of viral mutation(s) within the areas targeted by this assay, and inadequate number of viral copies(<138 copies/mL). A negative result must be combined with clinical  observations, patient history, and epidemiological information. The expected result is Negative.  Fact Sheet for Patients:  BloggerCourse.com  Fact Sheet for Healthcare Providers:  SeriousBroker.it  This test is no t yet approved or cleared by the [place] FDA and  has been authorized for detection and/or diagnosis of SARS-CoV-2 by FDA under an Emergency Use Authorization (EUA). This EUA will remain  in effect (meaning this test can be used) for the duration of the COVID-19 declaration under Section 564(b)(1) of the Act, 21 U.S.C.section 360bbb-3(b)(1), unless the authorization is terminated  or revoked sooner.       Influenza A by PCR NEGATIVE NEGATIVE Final   Influenza B by PCR NEGATIVE NEGATIVE Final    Comment: (NOTE) The Xpert Xpress SARS-CoV-2/FLU/RSV plus assay is intended as an aid in the diagnosis of influenza from Nasopharyngeal swab specimens and should not be used as a sole basis for treatment. Nasal washings and aspirates are unacceptable for Xpert Xpress SARS-CoV-2/FLU/RSV testing.  Fact Sheet for Patients: BloggerCourse.com  Fact Sheet for Healthcare Providers: SeriousBroker.it  This test is not yet approved or cleared by the [place] FDA and has been authorized for detection and/or diagnosis of SARS-CoV-2 by FDA under an Emergency Use Authorization (EUA). This EUA will remain in effect (meaning this test can be used) for the duration of the COVID-19 declaration under Section 564(b)(1) of the Act, 21 U.S.C. section 360bbb-3(b)(1), unless the authorization is terminated or revoked.  Performed at [facility], [address] [place], [place] [address]   Respiratory (~20 pathogens) panel by PCR     Status: None   Collection Time: 12/30/21  7:16 PM   Specimen: Nasopharyngeal Swab; Respiratory  Result Value Ref Range Status   Adenovirus NOT  DETECTED NOT DETECTED Final   Coronavirus 229E NOT DETECTED NOT DETECTED Final    Comment: (NOTE) The Coronavirus on the Respiratory Panel, DOES NOT test for the novel  Coronavirus (2019 nCoV)    Coronavirus HKU1 NOT DETECTED NOT DETECTED Final   Coronavirus NL63 NOT DETECTED NOT DETECTED Final   Coronavirus OC43 NOT DETECTED NOT DETECTED Final   Metapneumovirus NOT DETECTED NOT DETECTED Final   Rhinovirus / Enterovirus NOT DETECTED NOT DETECTED Final   Influenza A NOT DETECTED NOT DETECTED Final   Influenza B NOT DETECTED NOT DETECTED Final   Parainfluenza Virus 1 NOT DETECTED NOT DETECTED Final   Parainfluenza Virus 2 NOT DETECTED NOT DETECTED Final   Parainfluenza Virus 3 NOT DETECTED NOT DETECTED Final   Parainfluenza Virus 4 NOT DETECTED NOT DETECTED Final   Respiratory Syncytial Virus NOT DETECTED NOT DETECTED Final   Bordetella pertussis NOT DETECTED NOT DETECTED Final   Bordetella Parapertussis NOT DETECTED NOT DETECTED Final   Chlamydophila pneumoniae NOT DETECTED NOT DETECTED Final   Mycoplasma pneumoniae NOT DETECTED NOT DETECTED Final    Comment: Performed at [facility], [address], [place], [place] [address]  Blood culture (routine x 2)     Status: Abnormal   Collection Time:  12/30/21  8:24 PM   Specimen: BLOOD  Result Value Ref Range Status   Specimen Description   Final    BLOOD BLOOD LEFT FOREARM Performed at [facility], [address], [place], [place] [address]    Special Requests   Final    BOTTLES DRAWN AEROBIC AND ANAEROBIC Blood Culture adequate volume Performed at [facility], [address] [place], [place] [address]    Culture  Setup Time   Final    GRAM POSITIVE COCCI IN BOTH AEROBIC AND ANAEROBIC BOTTLES CRITICAL VALUE NOTED.  VALUE IS CONSISTENT WITH PREVIOUSLY REPORTED AND CALLED VALUE.    Culture (A)  Final    STAPHYLOCOCCUS EPIDERMIDIS THE SIGNIFICANCE OF ISOLATING THIS ORGANISM FROM A SINGLE SET OF BLOOD  CULTURES WHEN MULTIPLE SETS ARE DRAWN IS UNCERTAIN. PLEASE NOTIFY THE MICROBIOLOGY DEPARTMENT WITHIN ONE WEEK IF SPECIATION AND SENSITIVITIES ARE REQUIRED. Performed at [facility], [address], [place], [place] [address]    Report Status 01/02/2022 FINAL  Final  Blood culture (routine x 2)     Status: Abnormal   Collection Time: 12/30/21  8:24 PM   Specimen: BLOOD  Result Value Ref Range Status   Specimen Description   Final    BLOOD RIGHT ANTECUBITAL Performed at [facility], [address] [place], [place] [address]    Special Requests   Final    BOTTLES DRAWN AEROBIC AND ANAEROBIC Blood Culture adequate volume Performed at [facility], [address] [place], [place] [address]    Culture  Setup Time   Final    Organism ID to follow GRAM POSITIVE COCCI ANAEROBIC BOTTLE ONLY CRITICAL RESULT CALLED TO, READ BACK BY AND VERIFIED WITH: SUSAN WATSON 12/31/21 1235 [facility] GRAM STAIN REVIEWED-AGREE WITH RESULT    Culture (A)  Final    STAPHYLOCOCCUS HAEMOLYTICUS THE SIGNIFICANCE OF ISOLATING THIS ORGANISM FROM A SINGLE SET OF BLOOD CULTURES WHEN MULTIPLE SETS ARE DRAWN IS UNCERTAIN. PLEASE NOTIFY THE MICROBIOLOGY DEPARTMENT WITHIN ONE WEEK IF SPECIATION AND SENSITIVITIES ARE REQUIRED. Performed at [facility], [address], [place], [place] [address]    Report Status 01/02/2022 FINAL  Final  Blood Culture ID Panel (Reflexed)     Status: Abnormal   Collection Time: 12/30/21  8:24 PM  Result Value Ref Range Status   Enterococcus faecalis NOT DETECTED NOT DETECTED Final   Enterococcus Faecium NOT DETECTED NOT DETECTED Final   Listeria monocytogenes NOT DETECTED NOT DETECTED Final   Staphylococcus species DETECTED (A) NOT DETECTED Final    Comment: CRITICAL RESULT CALLED TO, READ BACK BY AND VERIFIED WITH: SUSAN WATSON 12/31/21 1235 SLM    Staphylococcus aureus (BCID) NOT DETECTED NOT DETECTED Final   Staphylococcus epidermidis NOT DETECTED NOT  DETECTED Final   Staphylococcus lugdunensis NOT DETECTED NOT DETECTED Final   Streptococcus species NOT DETECTED NOT DETECTED Final   Streptococcus agalactiae NOT DETECTED NOT DETECTED Final   Streptococcus pneumoniae NOT DETECTED NOT DETECTED Final   Streptococcus pyogenes NOT DETECTED NOT DETECTED Final   A.calcoaceticus-baumannii NOT DETECTED NOT DETECTED Final   Bacteroides fragilis NOT DETECTED NOT DETECTED Final   Enterobacterales NOT DETECTED NOT DETECTED Final   Enterobacter cloacae complex NOT DETECTED NOT DETECTED Final   Escherichia coli NOT DETECTED NOT DETECTED Final   Klebsiella aerogenes NOT DETECTED NOT DETECTED Final   Klebsiella oxytoca NOT DETECTED NOT DETECTED Final   Klebsiella pneumoniae NOT DETECTED NOT DETECTED Final   Proteus species NOT DETECTED NOT DETECTED  Final   Salmonella species NOT DETECTED NOT DETECTED Final   Serratia marcescens NOT DETECTED NOT DETECTED Final   Haemophilus influenzae NOT DETECTED NOT DETECTED Final   Neisseria meningitidis NOT DETECTED NOT DETECTED Final   Pseudomonas aeruginosa NOT DETECTED NOT DETECTED Final   Stenotrophomonas maltophilia NOT DETECTED NOT DETECTED Final   Candida albicans NOT DETECTED NOT DETECTED Final   Candida auris NOT DETECTED NOT DETECTED Final   Candida glabrata NOT DETECTED NOT DETECTED Final   Candida krusei NOT DETECTED NOT DETECTED Final   Candida parapsilosis NOT DETECTED NOT DETECTED Final   Candida tropicalis NOT DETECTED NOT DETECTED Final   Cryptococcus neoformans/gattii NOT DETECTED NOT DETECTED Final    Comment: Performed at [facility], [address] [place], [place] [address]  CULTURE, BLOOD (ROUTINE X 2) w Reflex to ID Panel     Status: None   Collection Time: 12/31/21  2:45 PM   Specimen: BLOOD  Result Value Ref Range Status   Specimen Description BLOOD RIGHT ANTECUBITAL  Final   Special Requests   Final    BOTTLES DRAWN AEROBIC AND ANAEROBIC Blood Culture adequate volume    Culture   Final    NO GROWTH 5 DAYS Performed at [facility], [address] [place], [place] [address]    Report Status 01/05/2022 FINAL  Final  CULTURE, BLOOD (ROUTINE X 2) w Reflex to ID Panel     Status: None   Collection Time: 12/31/21  2:46 PM   Specimen: BLOOD  Result Value Ref Range Status   Specimen Description BLOOD BLOOD RIGHT HAND  Final   Special Requests   Final    BOTTLES DRAWN AEROBIC AND ANAEROBIC Blood Culture adequate volume   Culture   Final    NO GROWTH 5 DAYS Performed at [facility], [address], [place], [place] [address]    Report Status 01/05/2022 FINAL  Final  MRSA Next Gen by PCR, Nasal     Status: None   Collection Time: 01/01/22  8:48 AM   Specimen: Nasal Mucosa; Nasal Swab  Result Value Ref Range Status   MRSA by PCR Next Gen NOT DETECTED NOT DETECTED Final    Comment: (NOTE) The GeneXpert MRSA Assay (FDA approved for NASAL specimens only), is one component of a comprehensive MRSA colonization surveillance program. It is not intended to diagnose MRSA infection nor to guide or monitor treatment for MRSA infections. Test performance is not FDA approved in patients less than 67 years old. Performed at [facility], [address] [place], [place] [address]     Procedures and diagnostic studies:  CT Angio Chest Pulmonary Embolism (PE) W or WO Contrast  Result Date: 01/04/2022 CLINICAL DATA:  Prostate cancer. Concern for pneumonia chest radiograph EXAM: CT ANGIOGRAPHY CHEST WITH CONTRAST TECHNIQUE: Multidetector CT imaging of the chest was performed using the standard protocol during bolus administration of intravenous contrast. Multiplanar CT image reconstructions and MIPs were obtained to evaluate the vascular anatomy. RADIATION DOSE REDUCTION: This exam was performed according to the departmental dose-optimization program which includes automated exposure control, adjustment of the mA and/or kV according to patient  size and/or use of iterative reconstruction technique. CONTRAST:  75mL OMNIPAQUE IOHEXOL 350 MG/ML SOLN COMPARISON:  Chest radiograph same day FINDINGS: Cardiovascular: No filling defects within the pulmonary arteries to suggest acute pulmonary embolism. Post CABG Mediastinum/Nodes: No axillary or supraclavicular adenopathy. No mediastinal or hilar adenopathy. No pericardial fluid. Esophagus normal. Lungs/Pleura: There is dense consolidation  in the posterior aspect of the RIGHT upper lobe with central air bronchograms and and air-fluid level in the superior RIGHT upper lobe (image 28/6). There is severe underlying centrilobular emphysema and bullous change in the apices. No obstructing mass is identified. Small RIGHT effusion at the base. Upper Abdomen: Limited view of the liver, kidneys, pancreas are unremarkable. Normal adrenal glands. Musculoskeletal: No aggressive osseous lesion. Review of the MIP images confirms the above findings. IMPRESSION: 1. Severe lobar pneumonia in the posterosuperior aspect of the RIGHT upper lobe. Air-fluid level in the upper lobe likely represents a parapneumonic effusion. Recommend follow-up imaging to exclude underlying malignancy. 2. No evidence acute pulmonary embolism. 3. Severe centrilobular emphysema and upper lobe bullous change. Electronically Signed   By: Genevive Bi M.D.   On: 01/04/2022 19:03   DG Chest Port 1 View  Result Date: 01/04/2022 CLINICAL DATA:  Community-acquired pneumonia, shortness of breath EXAM: PORTABLE CHEST 1 VIEW COMPARISON:  12/30/2021 FINDINGS: The heart size is normal. Status post median sternotomy and CABG. Redemonstrated dense consolidation of the inferior right upper lobe, not significantly changed. Underlying emphysema. The visualized skeletal structures are unremarkable. IMPRESSION: 1. Redemonstrated dense consolidation of the inferior right upper lobe, not significantly changed. Although most likely infectious, underlying malignancy is  a significant differential concern. Recommend at minimum follow-up radiographs in 6-8 weeks to ensure complete resolution. 2.  Emphysema. Electronically Signed   By: Jearld Lesch M.D.   On: 01/04/2022 13:15               LOS: 5 days   Topher Buenaventura  [facility]   Pager on [email or web address]. If 7PM-7AM, please contact night-coverage at [email or web address]     01/05/2022, 12:22 PM

## 2022-01-05 NOTE — Progress Notes (Signed)
PT Cancellation Note ? ?Patient Details ?Name: Thomas Trujillo ?MRN: [id number] ?DOB: [birth year] ? ? ?Cancelled Treatment:    Reason Eval/Treat Not Completed: Patient declined, no reason specified. States he is feeling better. Declines PT at this time, states maybe later. Will re-attempt as time allows.  ? ? ?Odell Choung ?01/05/2022, 11:19 AM ?

## 2022-01-05 NOTE — Consult Note (Signed)
? ? ? ?PULMONOLOGY ? ? ? ? ? ? ? ? ?Date: 01/05/2022,   ?MRN# [id number] Thomas Trujillo 11/12/1945 ? ? ?  ?AdmissionWeight: 68 kg                 ?CurrentWeight: 68 kg ? ? ?Referring physician: Dr Arbutus Ped ? ? ?CHIEF COMPLAINT:  ? ?Advanced COPD with refractory pneumonia ? ? ?HISTORY OF PRESENT ILLNESS  ? 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  He uses supplemental O2 at 2-3L/min at rest and 4L/min with exertion. He shares that he feels better with nebulizer therapy.  He was able to walk around slowly with PT but has not been able to do that over last 24h.  ? ?He has not slept well has had hicups for 48 hours.  We reviewed CT chest together.  ? ? ?PAST MEDICAL HISTORY  ? ?Past Medical History:  ?Diagnosis Date  ? Bleb, lung ([place])   ? COPD (chronic obstructive pulmonary disease) ([place])   ? Coronary artery disease 02/14/2017  ? NSTEMI with urgent CABG (LIMA-LAD and SVG->ramus)  ? Hyperlipidemia   ? Ischemic cardiomyopathy   ? Lung nodule   ? Pneumothorax 05/2017  ? Left  ? Prostate cancer ([place])   ? ? ? ?SURGICAL HISTORY  ? ?Past Surgical History:  ?Procedure Laterality Date  ? CATARACT EXTRACTION, BILATERAL    ? CORONARY ARTERY BYPASS GRAFT N/A 02/14/2017  ? Procedure: CORONARY ARTERY BYPASS GRAFTING (CABG) x 2 , using left internal mammary artery and right leg greater saphenous vein harvested endoscopically LIMA-LAD, SVG-RAMUS;  Surgeon: [name], MD;  Location: [place];  Service: Open Heart Surgery;  Laterality: N/A;  ? ENDOVEIN HARVEST OF GREATER SAPHENOUS VEIN Right 02/14/2017  ? Procedure: ENDOVEIN HARVEST OF RIGHT THIGH GREATER SAPHENOUS VEIN;  Surgeon: [name], MD;  Location: [place];  Service: Open Heart Surgery;  Laterality: Right;  ? LEFT HEART CATH AND CORONARY ANGIOGRAPHY N/A 02/14/2017  ?  Procedure: Left Heart Cath and Coronary Angiography;  Surgeon: [name], MD;  Location: [facility];  Service: Cardiovascular;  Laterality: N/A;  ? NASAL POLYP SURGERY    ? STAPLING OF BLEBS N/A 02/14/2017  ? Procedure: STAPLING OF LARGE LEFT UPPER LOBE PULMONARY BLEB;  Surgeon: [name], MD;  Location: [facility];  Service: Open Heart Surgery;  Laterality: N/A;  ? TEE WITHOUT CARDIOVERSION N/A 02/14/2017  ? Procedure: TRANSESOPHAGEAL ECHOCARDIOGRAM (TEE);  Surgeon: [name], MD;  Location: [facility] [place];  Service: Open Heart Surgery;  Laterality: N/A;  ? VIDEO ASSISTED THORACOSCOPY (VATS) W/TALC PLEUADESIS N/A 11/23/2017  ? Procedure: VIDEO ASSISTED THORACOSCOPY (VATS) W/TALC PLEUADESIS;  Surgeon: Nestor Lewandowsky, MD;  Location: [facility];  Service: Thoracic;  Laterality: N/A;  ? ? ? ?FAMILY HISTORY  ? ?Family History  ?Problem Relation Age of Onset  ? Obesity Son   ? ? ? ?SOCIAL HISTORY  ? ?Social History  ? ?Tobacco Use  ? Smoking status: Former  ?  Types: Cigarettes  ?  Quit date: 04/06/2007  ?  Years since quitting: 14.7  ? Smokeless tobacco: Never  ?Vaping Use  ? Vaping Use: Never used  ?Substance Use Topics  ? Alcohol use: No  ? Drug use: No  ? ? ? ?MEDICATIONS  ? ? ?Home Medication:  ?  ?  Current Medication: ? ?Current Facility-Administered Medications:  ?  0.9 %  sodium chloride infusion, , Intravenous, PRN, Ezekiel Slocumb, DO, Stopped at 01/02/22 2225 ?  albuterol (PROVENTIL) (2.5 MG/3ML) 0.083% nebulizer solution 2.5 mg, 2.5 mg, Nebulization, Q3H PRN, Nicole Kindred A, DO, 2.5 mg at 01/03/22 2018 ?  aspirin EC tablet 81 mg, 81 mg, Oral, Daily, Cox, Amy N, DO, 81 mg at 01/05/22 0845 ?  atorvastatin (LIPITOR) tablet 40 mg, 40 mg, Oral, Daily, Cox, Amy N, DO, 40 mg at 01/05/22 0845 ?  azithromycin (ZITHROMAX) tablet 500 mg, 500 mg, Oral, Daily, Jennye Boroughs, MD, 500 mg at 01/05/22 1023 ?  budesonide (PULMICORT) nebulizer solution 0.25 mg, 0.25 mg, Nebulization, BID, Nicole Kindred A,  DO, 0.25 mg at 01/05/22 7591 ?  cefTRIAXone (ROCEPHIN) 1 g in sodium chloride 0.9 % 100 mL IVPB, 1 g, Intravenous, Q24H, Jennye Boroughs, MD, Last Rate: 200 mL/hr at 01/05/22 1029, 1 g at 01/05/22 1029 ?  chlorpheniramine-HYDROcodone 10-8 MG/5ML suspension 5 mL, 5 mL, Oral, Q12H PRN, Nicole Kindred A, DO ?  enoxaparin (LOVENOX) injection 40 mg, 40 mg, Subcutaneous, Q24H, Cox, Amy N, DO, 40 mg at 01/04/22 2015 ?  ferrous sulfate tablet 325 mg, 325 mg, Oral, Q breakfast, Cox, Amy N, DO, 325 mg at 01/05/22 0845 ?  guaiFENesin-dextromethorphan (ROBITUSSIN DM) 100-10 MG/5ML syrup 5 mL, 5 mL, Oral, Q4H PRN, Sharion Settler, NP, 5 mL at 01/01/22 0101 ?  ipratropium-albuterol (DUONEB) 0.5-2.5 (3) MG/3ML nebulizer solution 3 mL, 3 mL, Nebulization, Q6H, Griffith, Kelly A, DO, 3 mL at 01/05/22 1311 ?  lidocaine (LIDODERM) 5 % 1 patch, 1 patch, Transdermal, Daily PRN, Cox, Amy N, DO ?  methylPREDNISolone sodium succinate (SOLU-MEDROL) 125 mg/2 mL injection 60 mg, 60 mg, Intravenous, Q24H, Griffith, Kelly A, DO, 60 mg at 01/04/22 1557 ?  montelukast (SINGULAIR) tablet 10 mg, 10 mg, Oral, QHS, Cox, Amy N, DO, 10 mg at 01/04/22 2015 ?  multivitamin with minerals tablet, , Oral, Daily, Cox, Amy N, DO, 1 tablet at 01/05/22 0845 ?  ondansetron (ZOFRAN) tablet 4 mg, 4 mg, Oral, Q6H PRN **OR** ondansetron (ZOFRAN) injection 4 mg, 4 mg, Intravenous, Q6H PRN, Cox, Amy N, DO ?  tamsulosin (FLOMAX) capsule 0.4 mg, 0.4 mg, Oral, Daily, Cox, Amy N, DO, 0.4 mg at 01/05/22 0845 ? ? ? ?ALLERGIES  ? ?No known allergies ? ? ? ? ?REVIEW OF SYSTEMS  ? ? ?Review of Systems: ? ?Gen:  Denies  fever, sweats, chills weigh loss  ?HEENT: Denies blurred vision, double vision, ear pain, eye pain, hearing loss, nose bleeds, sore throat ?Cardiac:  No dizziness, chest pain or heaviness, chest tightness,edema ?Resp:   Denies cough or sputum porduction, shortness of breath,wheezing, hemoptysis,  ?Gi: Denies swallowing difficulty, stomach pain, nausea or  vomiting, diarrhea, constipation, bowel incontinence ?Gu:  Denies bladder incontinence, burning urine ?Ext:   Denies Joint pain, stiffness or swelling ?Skin: Denies  skin rash, easy bruising or bleeding or hives ?Endoc:  Denies polyuria, polydipsia , polyphagia or weight change ?Psych:   Denies depression, insomnia or hallucinations  ? ?Other:  All other systems negative ? ? ?VS: BP 117/67 (BP Location: Right Arm)   Pulse 100   Temp 98.3 ?F (36.8 ?C) (Oral)   Resp 18   Ht '5\' 4"'$  (1.626 m)   Wt 68 kg   SpO2 97%   BMI 25.73 kg/m?   ? ? ? ?PHYSICAL EXAM  ? ? ?GENERAL:NAD, no fevers, chills, no weakness no fatigue ?  HEAD: Normocephalic, atraumatic.  ?EYES: Pupils equal, round, reactive to light. Extraocular muscles intact. No scleral icterus.  ?MOUTH: Moist mucosal membrane. Dentition intact. No abscess noted.  ?EAR, NOSE, THROAT: Clear without exudates. No external lesions.  ?NECK: Supple. No thyromegaly. No nodules. No JVD.  ?PULMONARY:rhonchi bilaterally worse at RUL zone ?CARDIOVASCULAR: S1 and S2. Regular rate and rhythm. No murmurs, rubs, or gallops. No edema. Pedal pulses 2+ bilaterally.  ?GASTROINTESTINAL: Soft, nontender, nondistended. No masses. Positive bowel sounds. No hepatosplenomegaly.  ?MUSCULOSKELETAL: No swelling, clubbing, or edema. Range of motion full in all extremities.  ?NEUROLOGIC: Cranial nerves II through XII are intact. No gross focal neurological deficits. Sensation intact. Reflexes intact.  ?SKIN: No ulceration, lesions, rashes, or cyanosis. Skin warm and dry. Turgor intact.  ?PSYCHIATRIC: Mood, affect within normal limits. The patient is awake, alert and oriented x 3. Insight, judgment intact.  ? ? ?  ? ?IMAGING  ? ? ?CT Angio Chest Pulmonary Embolism (PE) W or WO Contrast ? ?Result Date: 01/04/2022 ?CLINICAL DATA:  Prostate cancer. Concern for pneumonia chest radiograph EXAM: CT ANGIOGRAPHY CHEST WITH CONTRAST TECHNIQUE: Multidetector CT imaging of the chest was performed using the  standard protocol during bolus administration of intravenous contrast. Multiplanar CT image reconstructions and MIPs were obtained to evaluate the vascular anatomy. RADIATION DOSE REDUCTION: This exam was performed accordi

## 2022-01-05 NOTE — Plan of Care (Signed)
  Problem: Activity: Goal: Ability to tolerate increased activity will improve Outcome: Progressing   

## 2022-01-05 NOTE — Progress Notes (Addendum)
? ?  PATIENT: Thomas Trujillo           ?DPO:[id number]         ?DOB: [birth year]                     ?Subjective: Nurse reports patient increased shortness of Breath. ABG ordered prior to my shift resulted, denotes crhonic resp failure.  ?Nurse states she has cared for him for the last 2 nights prior to this and he is significantly more short of breath tonight. ? ?Objective: ?Vitals:  ? 01/05/22 1841 01/05/22 2034  ?BP: (!) 132/56 (!) 132/48  ?Pulse: (!) 108 (!) 110  ?Resp:  (!) 26  ?Temp:  100.1 ?F (37.8 ?C)  ?SpO2: 93% 95%  ? ? ? ?Assessment:  ?General: pale, mild distress ?CV: SR, No pedal edema ?Resp: increased work of breathing, prolonged expiration Overal diminished but expiratory wheeze note faintly - Productive cough dark yellow blood tinged thick mucous ? ?Plan:  ?Acute on chronic Respiratory failure ?Bipap - transfer to progressive unit - patient verbalizes improved shortness of breath ? ? ?Emmilynn Marut L. Randol Kern NP ?[facility] ? ?  ?

## 2022-01-06 ENCOUNTER — Ambulatory Visit: Payer: Medicare Other

## 2022-01-06 DIAGNOSIS — Z515 Encounter for palliative care: Secondary | ICD-10-CM

## 2022-01-06 DIAGNOSIS — R6520 Severe sepsis without septic shock: Secondary | ICD-10-CM

## 2022-01-06 DIAGNOSIS — R0602 Shortness of breath: Secondary | ICD-10-CM

## 2022-01-06 DIAGNOSIS — Z66 Do not resuscitate: Secondary | ICD-10-CM

## 2022-01-06 DIAGNOSIS — C61 Malignant neoplasm of prostate: Secondary | ICD-10-CM

## 2022-01-06 DIAGNOSIS — J189 Pneumonia, unspecified organism: Secondary | ICD-10-CM | POA: Diagnosis not present

## 2022-01-06 DIAGNOSIS — R0789 Other chest pain: Secondary | ICD-10-CM | POA: Diagnosis not present

## 2022-01-06 DIAGNOSIS — A419 Sepsis, unspecified organism: Principal | ICD-10-CM

## 2022-01-06 DIAGNOSIS — J9621 Acute and chronic respiratory failure with hypoxia: Secondary | ICD-10-CM | POA: Diagnosis not present

## 2022-01-06 LAB — CBC WITH DIFFERENTIAL/PLATELET
Abs Immature Granulocytes: 0.53 10*3/uL — ABNORMAL HIGH (ref 0.00–0.07)
Basophils Absolute: 0.1 10*3/uL (ref 0.0–0.1)
Basophils Relative: 0 %
Eosinophils Absolute: 0.1 10*3/uL (ref 0.0–0.5)
Eosinophils Relative: 0 %
HCT: 37.6 % — ABNORMAL LOW (ref 39.0–52.0)
Hemoglobin: 12.2 g/dL — ABNORMAL LOW (ref 13.0–17.0)
Immature Granulocytes: 2 %
Lymphocytes Relative: 2 %
Lymphs Abs: 0.4 10*3/uL — ABNORMAL LOW (ref 0.7–4.0)
MCH: 30.7 pg (ref 26.0–34.0)
MCHC: 32.4 g/dL (ref 30.0–36.0)
MCV: 94.5 fL (ref 80.0–100.0)
Monocytes Absolute: 1.2 10*3/uL — ABNORMAL HIGH (ref 0.1–1.0)
Monocytes Relative: 5 %
Neutro Abs: 23.5 10*3/uL — ABNORMAL HIGH (ref 1.7–7.7)
Neutrophils Relative %: 91 %
Platelets: 236 10*3/uL (ref 150–400)
RBC: 3.98 MIL/uL — ABNORMAL LOW (ref 4.22–5.81)
RDW: 13.6 % (ref 11.5–15.5)
WBC: 25.8 10*3/uL — ABNORMAL HIGH (ref 4.0–10.5)
nRBC: 0 % (ref 0.0–0.2)

## 2022-01-06 LAB — BASIC METABOLIC PANEL
Anion gap: 8 (ref 5–15)
BUN: 32 mg/dL — ABNORMAL HIGH (ref 8–23)
CO2: 30 mmol/L (ref 22–32)
Calcium: 7.9 mg/dL — ABNORMAL LOW (ref 8.9–10.3)
Chloride: 101 mmol/L (ref 98–111)
Creatinine, Ser: 1.03 mg/dL (ref 0.61–1.24)
GFR, Estimated: >60 mL/min (ref >60)
Glucose, Bld: 128 mg/dL — ABNORMAL HIGH (ref 70–99)
Potassium: 4.3 mmol/L (ref 3.5–5.1)
Sodium: 139 mmol/L (ref 135–145)

## 2022-01-06 LAB — C-REACTIVE PROTEIN: CRP: 28.3 mg/dL — ABNORMAL HIGH (ref <1.0)

## 2022-01-06 NOTE — Progress Notes (Signed)
Orders for Bipap. Placed patient on Bipap without complication. Patient tolerating well. At 2250, patient transferred to Progressive care unit. Patient wanted to come off Bipap to receive nebulizer treatment. Treatment given. Patient was placed on 5 lpm nasal cannula following treatment per patient request. Checked back at 00:00 to see if patient was ready to go back on Bipap. Patient declined and wanted to sleep with nasal cannula only. Will continue to monitor.  ?

## 2022-01-06 NOTE — Progress Notes (Signed)
? ? ? ?PULMONOLOGY ? ? ? ? ? ? ? ? ?Date: 01/06/2022,   ?MRN# [id number] Thomas Trujillo [birth year] ? ? ?  ?AdmissionWeight: 68 kg                 ?CurrentWeight: 68 kg ? ? ?Referring physician: Dr Arbutus Ped ? ? ?CHIEF COMPLAINT:  ? ?Advanced COPD with refractory pneumonia ? ? ?HISTORY OF PRESENT ILLNESS  ? 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  He uses supplemental O2 at 2-3L/min at rest and 4L/min with exertion. He shares that he feels better with nebulizer therapy.  He was able to walk around slowly with PT but has not been able to do that over last 24h.  ? ?He has not slept well has had hicups for 48 hours.  We reviewed CT chest together.  ? ? ?01/06/22- patient s/p PALS eval. He is now DNR. PNA treatment is ongoing. Poor prognosis.  ? ?PAST MEDICAL HISTORY  ? ?Past Medical History:  ?Diagnosis Date  ? Bleb, lung ([place])   ? COPD (chronic obstructive pulmonary disease) ([place])   ? Coronary artery disease 02/14/2017  ? NSTEMI with urgent CABG (LIMA-LAD and SVG->ramus)  ? Hyperlipidemia   ? Ischemic cardiomyopathy   ? Lung nodule   ? Pneumothorax 05/2017  ? Left  ? Prostate cancer ([place])   ? ? ? ?SURGICAL HISTORY  ? ?Past Surgical History:  ?Procedure Laterality Date  ? CATARACT EXTRACTION, BILATERAL    ? CORONARY ARTERY BYPASS GRAFT N/A 02/14/2017  ? Procedure: CORONARY ARTERY BYPASS GRAFTING (CABG) x 2 , using left internal mammary artery and right leg greater saphenous vein harvested endoscopically LIMA-LAD, SVG-RAMUS;  Surgeon: [name], MD;  Location: [place];  Service: Open Heart Surgery;  Laterality: N/A;  ? ENDOVEIN HARVEST OF GREATER SAPHENOUS VEIN Right 02/14/2017  ? Procedure: ENDOVEIN HARVEST OF RIGHT THIGH GREATER SAPHENOUS VEIN;  Surgeon: [name], MD;  Location: [place];  Service: Open Heart  Surgery;  Laterality: Right;  ? LEFT HEART CATH AND CORONARY ANGIOGRAPHY N/A 02/14/2017  ? Procedure: Left Heart Cath and Coronary Angiography;  Surgeon: [name], MD;  Location: [place] [facility];  Service: Cardiovascular;  Laterality: N/A;  ? NASAL POLYP SURGERY    ? STAPLING OF BLEBS N/A 02/14/2017  ? Procedure: STAPLING OF LARGE LEFT UPPER LOBE PULMONARY BLEB;  Surgeon: [name], MD;  Location: [place];  Service: Open Heart Surgery;  Laterality: N/A;  ? TEE WITHOUT CARDIOVERSION N/A 02/14/2017  ? Procedure: TRANSESOPHAGEAL ECHOCARDIOGRAM (TEE);  Surgeon: [name], MD;  Location: [place];  Service: Open Heart Surgery;  Laterality: N/A;  ? VIDEO ASSISTED THORACOSCOPY (VATS) W/TALC PLEUADESIS N/A 11/23/2017  ? Procedure: VIDEO ASSISTED THORACOSCOPY (VATS) W/TALC PLEUADESIS;  Surgeon: Nestor Lewandowsky, MD;  Location: [facility];  Service: Thoracic;  Laterality: N/A;  ? ? ? ?FAMILY HISTORY  ? ?Family History  ?Problem Relation Age of Onset  ? Obesity Son   ? ? ? ?SOCIAL HISTORY  ? ?Social History  ? ?Tobacco Use  ? Smoking status: Former  ?  Types: Cigarettes  ?  Quit date: 04/06/2007  ?  Years since quitting: 14.7  ? Smokeless tobacco: Never  ?Vaping Use  ? Vaping Use: Never used  ?Substance Use Topics  ? Alcohol use: No  ?  Drug use: No  ? ? ? ?MEDICATIONS  ? ? ?Home Medication:  ?  ?Current Medication: ? ?Current Facility-Administered Medications:  ?  0.9 %  sodium chloride infusion, , Intravenous, PRN, Ezekiel Slocumb, DO, Stopped at 01/02/22 2225 ?  albuterol (PROVENTIL) (2.5 MG/3ML) 0.083% nebulizer solution 2.5 mg, 2.5 mg, Nebulization, Q3H PRN, Nicole Kindred A, DO, 2.5 mg at 01/03/22 2018 ?  aspirin EC tablet 81 mg, 81 mg, Oral, Daily, Cox, Amy N, DO, 81 mg at 01/06/22 8657 ?  atorvastatin (LIPITOR) tablet 40 mg, 40 mg, Oral, Daily, Cox, Amy N, DO, 40 mg at 01/06/22 8469 ?  chlorpheniramine-HYDROcodone 10-8 MG/5ML suspension 5 mL, 5 mL, Oral, Q12H PRN, Nicole Kindred A, DO ?  enoxaparin  (LOVENOX) injection 40 mg, 40 mg, Subcutaneous, Q24H, Cox, Amy N, DO, 40 mg at 01/05/22 2044 ?  ferrous sulfate tablet 325 mg, 325 mg, Oral, Q breakfast, Cox, Amy N, DO, 325 mg at 01/06/22 6295 ?  ipratropium-albuterol (DUONEB) 0.5-2.5 (3) MG/3ML nebulizer solution 3 mL, 3 mL, Nebulization, Q4H, Trevino Wyatt, MD, 3 mL at 01/06/22 1114 ?  lidocaine (LIDODERM) 5 % 1 patch, 1 patch, Transdermal, Daily PRN, Cox, Amy N, DO ?  methylPREDNISolone sodium succinate (SOLU-MEDROL) 40 mg/mL injection 40 mg, 40 mg, Intravenous, Q24H, Lanney Gins, Naser Schuld, MD, 40 mg at 01/05/22 1602 ?  montelukast (SINGULAIR) tablet 10 mg, 10 mg, Oral, QHS, Cox, Amy N, DO, 10 mg at 01/05/22 2044 ?  multivitamin with minerals tablet, , Oral, Daily, Cox, Amy N, DO, 1 tablet at 01/06/22 0851 ?  ondansetron (ZOFRAN) tablet 4 mg, 4 mg, Oral, Q6H PRN **OR** ondansetron (ZOFRAN) injection 4 mg, 4 mg, Intravenous, Q6H PRN, Cox, Amy N, DO ?  tamsulosin (FLOMAX) capsule 0.4 mg, 0.4 mg, Oral, Daily, Cox, Amy N, DO, 0.4 mg at 01/06/22 2841 ? ? ? ?ALLERGIES  ? ?No known allergies ? ? ? ? ?REVIEW OF SYSTEMS  ? ? ?Review of Systems: ? ?Gen:  Denies  fever, sweats, chills weigh loss  ?HEENT: Denies blurred vision, double vision, ear pain, eye pain, hearing loss, nose bleeds, sore throat ?Cardiac:  No dizziness, chest pain or heaviness, chest tightness,edema ?Resp:   Denies cough or sputum porduction, shortness of breath,wheezing, hemoptysis,  ?Gi: Denies swallowing difficulty, stomach pain, nausea or vomiting, diarrhea, constipation, bowel incontinence ?Gu:  Denies bladder incontinence, burning urine ?Ext:   Denies Joint pain, stiffness or swelling ?Skin: Denies  skin rash, easy bruising or bleeding or hives ?Endoc:  Denies polyuria, polydipsia , polyphagia or weight change ?Psych:   Denies depression, insomnia or hallucinations  ? ?Other:  All other systems negative ? ? ?VS: BP (!) 102/38 (BP Location: Right Arm)   Pulse 71   Temp 98.4 ?F (36.9 ?C) (Axillary)    Resp 17   Ht '5\' 4"'$  (1.626 m)   Wt 68 kg   SpO2 94%   BMI 25.73 kg/m?   ? ? ? ?PHYSICAL EXAM  ? ? ?GENERAL:NAD, no fevers, chills, no weakness no fatigue ?HEAD: Normocephalic, atraumatic.  ?EYES: Pupils equal, round, reactive to light. Extraocular muscles intact. No scleral icterus.  ?MOUTH: Moist mucosal membrane. Dentition intact. No abscess noted.  ?EAR, NOSE, THROAT: Clear without exudates. No external lesions.  ?NECK: Supple. No thyromegaly. No nodules. No JVD.  ?PULMONARY:rhonchi bilaterally worse at RUL zone ?CARDIOVASCULAR: S1 and S2. Regular rate and rhythm. No murmurs, rubs, or gallops. No edema. Pedal pulses 2+ bilaterally.  ?GASTROINTESTINAL: Soft, nontender, nondistended. No masses. Positive bowel sounds.  No hepatosplenomegaly.  ?MUSCULOSKELETAL: No swelling, clubbing, or edema. Range of motion full in all extremities.  ?NEUROLOGIC: Cranial nerves II through XII are intact. No gross focal neurological deficits. Sensation intact. Reflexes intact.  ?SKIN: No ulceration, lesions, rashes, or cyanosis. Skin warm and dry. Turgor intact.  ?PSYCHIATRIC: Mood, affect within normal limits. The patient is awake, alert and oriented x 3. Insight, judgment intact.  ? ? ?  ? ?IMAGING  ? ? ?CT Angio Chest Pulmonary Embolism (PE) W or WO Contrast ? ?Result Date: 01/04/2022 ?CLINICAL DATA:  Prostate cancer. Concern for pneumonia chest radiograph EXAM: CT ANGIOGRAPHY CHEST WITH CONTRAST TECHNIQUE: Multidetector CT imaging of the chest was performed using the standard protocol during bolus administration of intravenous contrast. Multiplanar CT image reconstructions and MIPs were obtained to evaluate the vascular anatomy. RADIATION DOSE REDUCTION: This exam was performed according to the departmental dose-optimization program which includes automated exposure control, adjustment of the mA and/or kV according to patient size and/or use of iterative reconstruction technique. CONTRAST:  63m OMNIPAQUE IOHEXOL 350 MG/ML  SOLN COMPARISON:  Chest radiograph same day FINDINGS: Cardiovascular: No filling defects within the pulmonary arteries to suggest acute pulmonary embolism. Post CABG Mediastinum/Nodes: No axillary or supraclavicul

## 2022-01-06 NOTE — Consult Note (Signed)
? ?                                                                                ?Consultation Note ?Date: 01/06/2022  ? ?Patient Name: Thomas Trujillo  ?DOB: 10/08/1946  MRN: [id number]  Age / Sex: 76 y.o., male  ?PCP: [name], MD ?Referring Physician: Jennye Boroughs, MD ? ?Reason for Consultation: Establishing goals of care ? ?HPI/Patient Profile: 76 y.o. male  with past medical history of prostate cancer (radiation to start recently), BPH, HLD, advanced COPD (2 L [place] baseline at home, 4 L with exertion), CAD, NSTEMI with CABG x2, and history of left pneumothorax admitted on 01/04/2022 with fever and shortness of breath.  Patient is being treated for community-acquired pneumonia.  On night of 3/15, patient was transferred to progressive care due to increasing shortness of breath and use of BiPAP.  ? ?Clinical Assessment and Goals of Care: ?I have reviewed medical records including EPIC notes, labs and imaging, assessed the patient and then met with patient Thomas Trujillo at bedside  to discuss diagnosis prognosis, GOC, EOL wishes, disposition and options. ? ?I introduced Palliative Medicine as specialized medical care for people living with serious illness. It focuses on providing relief from the symptoms and stress of a serious illness. The goal is to improve quality of life for both the patient and the family. He had 4 children, and oldest passed from Covid.  ? ?We discussed a brief life review of the patient.  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  He moved to [place] to be with his son and unfortunately his son passed from [place] approximately a year and a half ago.  Thomas Trujillo says he is "now alone". He lives by hiumself.  He shares his other children still live in [place]. ? ?We discussed patient's current illness and what it means in the larger context of patient's on-going co-morbidities. He says he coughed up blood and sent a specimen for testing yesterday. He become  significantly short of breath when speaking just a few sentences. He asked that he receive a breathing treatment and get put back on his bipap. ? ?Thomas Trujillo confirmed it is ok for me to speak with his son [name] over the phone.  ? ?I spoke with [name] over the phone to discuss prognosis, diagnosis, disposition, and options.  Family is facing treatment option decisions and anticipatory care needs.  [name] lives in [place] with patient's other 2 sons.  Patrick's plan is to hopefully stabilize his father and make efforts and plans to move him back to [place] to be with his family. ? ?Discussed with [name] that patient is still alert and oriented x4 and has capacity to make his own decisions but that updating family and next of kin is important to the patient's overal plan of care. ? ?Reviewed patient's advanced COPD and how it is a chronic and nonreversible illness. [name] is aware that patient's COPD is significant and that he is also battling community-acquired pneumonia. ? ?I attempted to elicit values and goals of care important to the patient.  [name] shares he knows his father would never want  to be on a breathing machine or have a feeding tube.  DNR is in place and remains. ?  ?Discussed with patient/family the importance of continued conversation with family and the medical providers regarding overall plan of care and treatment options, ensuring decisions are within the context of the patient?s values and GOCs.   ? ?[name] is requesting updates from primary team so that he can better gauge if and/or when he/other family should leave [place] to visit with his father. ? ?Questions and concerns were addressed. The family was encouraged to call with questions or concerns.  ? ?Palliative medicine team will continue to follow the patient throughout his hospitalization. ? ?Primary Decision Maker ?PATIENT ? ?Code Status/Advance Care Planning: ?DNR ? ?Prognosis:   ?Unable to determine ? ?Discharge Planning: To Be  Determined ? ?Primary Diagnoses: ?Present on Admission: ? Community acquired pneumonia ? COPD with acute exacerbation ([place]) ? Acute on chronic respiratory failure with hypoxia ([facility]) ? Coronary artery disease involving native coronary artery of native heart without angina pectoris ? Chronic respiratory failure with hypoxia ([facility]) ? Prostate cancer ([place]) ? Hyperlipidemia LDL goal <70 ? Chest pain, non-cardiac ? Elevated troponin ? Severe sepsis ([place]) ? Positive blood culture ? ? ?Physical Exam ?Vitals reviewed.  ?Constitutional:   ?   General: He is not in acute distress. ?   Appearance: He is not toxic-appearing.  ?HENT:  ?   Head: Normocephalic and atraumatic.  ?Cardiovascular:  ?   Rate and Rhythm: Normal rate.  ?Pulmonary:  ?   Comments: Increased WOB, dyspnea with talking and exertion ?Musculoskeletal:  ?   Cervical back: Normal range of motion.  ?   Comments: Generalized weakness  ?Skin: ?   General: Skin is warm and dry.  ?Neurological:  ?   Mental Status: He is alert and oriented to person, place, and time.  ?Psychiatric:     ?   Mood and Affect: Mood normal. Mood is not anxious.     ?   Behavior: Behavior normal. Behavior is not agitated.  ? ? ?Palliative Assessment/Data: 60% ? ? ? ? ?I discussed this patient's plan of care with patient, patient's son. ? ?Thank you for this consult. Palliative medicine will continue to follow and assist holistically.  ? ?Time Total: 75 minutes ?Greater than 50%  of this time was spent counseling and coordinating care related to the above assessment and plan. ? ?Signed by: ?Jordan Hawks, DNP, FNP-BC ?Palliative Medicine ? ?  ?Please contact Palliative Medicine Team phone at [phone] for questions and concerns.  ?For individual provider: See Amion ? ? ? ? ? ? ? ? ? ? ? ? ?  ?

## 2022-01-06 NOTE — Progress Notes (Signed)
PT Cancellation Note ? ?Patient Details ?Name: Thomas Trujillo ?MRN: [id number] ?DOB: 02/27/1946 ? ? ?Cancelled Treatment:    Reason Eval/Treat Not Completed: Patient declined, no reason specified. RN cleared patient for PT.  Despite encouragement patient continues to decline therapy. Will continue to follow. He does have low BP today and is back on Bipap for comfort.   ? ? ?[name] ?01/06/2022, 1:56 PM ?

## 2022-01-06 NOTE — Plan of Care (Signed)
?  Problem: Activity: ?Goal: Ability to tolerate increased activity will improve ?Outcome: Progressing ?  ?Problem: Activity: ?Goal: Will verbalize the importance of balancing activity with adequate rest periods ?Outcome: Progressing ?  ?

## 2022-01-06 NOTE — Progress Notes (Signed)
? ? ? ?Progress Note  ? ? ?Thomas Trujillo  BLT:[id number] DOB: 1946-05-31  DOA: 12/28/2021 ?PCP: [name], MD  ? ? ? ? ?Brief Narrative:  ? ? ?Medical records reviewed and are as summarized below: ? ? ?Thomas Trujillo is a 76 year old male with history of BPH, hyperlipidemia, advanced COPD on 2 L/min home oxygen with activity only, history of CAD, prostate cancer soon to start on radiation therapy, history of NSTEMI, status post CABG x2, history of pneumothorax on the left, who presented to the ED on 12/24/2021 for evaluation of fevers and shortness of breath. ? ?Evaluation in the ED revealed severe sepsis secondary to community-acquired pneumonia complicated by acute respiratory failure with hypoxia requiring as much as 6 to 7 L/min oxygen. ? ?He was treated with empiric IV Rocephin and azithromycin. ? ? ? ? ? ? ? ?Assessment/Plan:  ? ?Principal Problem: ?  Severe sepsis ([place]) ?Active Problems: ?  Community acquired pneumonia ?  Acute on chronic respiratory failure with hypoxia (HCC) ?  COPD with acute exacerbation ([place]) ?  Chronic respiratory failure with hypoxia (HCC) ?  Chest pain, non-cardiac ?  Coronary artery disease involving native coronary artery of native heart without angina pectoris ?  Elevated troponin ?  Hyperlipidemia LDL goal <70 ?  Prostate cancer ([place]) ?  Positive blood culture ? ? ? ?Body mass index is 25.73 kg/m?. ? ? ?Severe sepsis secondary to community-acquired pneumonia: CT chest on 01/04/2022 showed severe right upper lobar pneumonia.  Continue IV ceftriaxone and oral azithromycin through 01/07/2022.   ? ?Acute on chronic hypoxic respiratory failure: He had to use BiPAP yesterday evening because of worsening respiratory failure.  He was taken off BiPAP earlier this morning but he has been placed back on BiPAP because of difficulty breathing.  Taper off BiPAP as able.  He uses 2 to 4 L/min oxygen at home ? ?COPD exacerbation: Continue steroids and bronchodilators. ? ?Worsening leukocytosis:  This is likely from steroids ? ?Elevated troponin: This is likely from demand ischemia. ? ?Staph epidermidis and staph hemolyticus bacteremia: This is likely from contamination.  Repeat blood cultures from 12/31/2021 were negative. ? ?Prostate cancer: Continue Flomax.  He is supposed to start radiation therapy in the outpatient setting.  Follow-up with urologist, oncologist and radiation oncologist. ? ?Elevated troponin: This is likely from demand ischemia. ?Other comorbidities include hyperlipidemia, CAD ? ? ?Diet Order   ? ?       ?  Diet Heart Room service appropriate? Yes; Fluid consistency: Thin  Diet effective now       ?  ? ?  ?  ? ?  ? ? ? ? ? ? ?Consultants: ?Pulmonologist ?Palliative care ? ?Procedures: ?None ? ? ? ?Medications:  ? ? aspirin EC  81 mg Oral Daily  ? atorvastatin  40 mg Oral Daily  ? enoxaparin (LOVENOX) injection  40 mg Subcutaneous Q24H  ? ferrous sulfate  325 mg Oral Q breakfast  ? ipratropium-albuterol  3 mL Nebulization Q4H  ? methylPREDNISolone (SOLU-MEDROL) injection  40 mg Intravenous Q24H  ? montelukast  10 mg Oral QHS  ? multivitamin with minerals   Oral Daily  ? tamsulosin  0.4 mg Oral Daily  ? ?Continuous Infusions: ? sodium chloride Stopped (01/02/22 2225)  ? ? ? ?Anti-infectives (From admission, onward)  ? ? Start     Dose/Rate Route Frequency Ordered Stop  ? 01/05/22 1015  azithromycin (ZITHROMAX) tablet 500 mg       ?  500 mg Oral Daily 01/05/22 0923 01/06/22 0851  ? 01/05/22 1000  cefTRIAXone (ROCEPHIN) 1 g in sodium chloride 0.9 % 100 mL IVPB       ? 1 g ?200 mL/hr over 30 Minutes Intravenous Every 24 hours 01/05/22 0923 01/06/22 1135  ? 12/31/21 2200  azithromycin (ZITHROMAX) 500 mg in sodium chloride 0.9 % 250 mL IVPB  Status:  Discontinued       ? 500 mg ?250 mL/hr over 60 Minutes Intravenous Every 24 hours 12/29/2021 2035 12/31/21 1432  ? 12/31/21 2200  cefTRIAXone (ROCEPHIN) 2 g in sodium chloride 0.9 % 100 mL IVPB       ? 2 g ?200 mL/hr over 30 Minutes Intravenous Every  24 hours 01/06/2022 2035 01/03/22 2051  ? 12/31/21 2200  azithromycin (ZITHROMAX) tablet 500 mg       ? 500 mg Oral Daily at bedtime 12/31/21 1432 01/03/22 2007  ? 12/26/2021 2045  cefTRIAXone (ROCEPHIN) 1 g in sodium chloride 0.9 % 100 mL IVPB  Status:  Discontinued       ?Note to Pharmacy: Please total to 2 g IVPB today, thank you.  ? 1 g ?200 mL/hr over 30 Minutes Intravenous  Once 01/21/2022 2034 01/03/2022 2039  ? 01/04/2022 2015  azithromycin (ZITHROMAX) 500 mg in sodium chloride 0.9 % 250 mL IVPB       ? 500 mg ?250 mL/hr over 60 Minutes Intravenous  Once 01/20/2022 2009 12/31/2021 2231  ? 01/02/2022 2015  cefTRIAXone (ROCEPHIN) 1 g in sodium chloride 0.9 % 100 mL IVPB       ? 1 g ?200 mL/hr over 30 Minutes Intravenous  Once 12/27/2021 2009 01/11/2022 2052  ? ?  ? ? ? ? ? ? ? ? ? ?Family Communication/Anticipated D/C date and plan/Code Status  ? ?DVT prophylaxis: enoxaparin (LOVENOX) injection 40 mg Start: 01/16/2022 2200 ?Place TED hose Start: 12/28/2021 2032 ? ? ?  Code Status: DNR ? ?Family Communication: [name], son, over the phone ?Disposition Plan: Unable to determine at this time ? ? ?Status is: Inpatient ?Remains inpatient appropriate because: IV antibiotics ? ? ? ? ? ? ?Subjective:  ? ?Interval events noted.  He complains of shortness of breath and cough. ? ?Objective:  ? ? ?Vitals:  ? 01/06/22 1121 01/06/22 1146 01/06/22 1527 01/06/22 1600  ?BP:  (!) 102/38  (!) 115/49  ?Pulse: 94 71 71 95  ?Resp: (!) '22 17 20 20  '$ ?Temp:  98.4 ?F (36.9 ?C)  97.8 ?F (36.6 ?C)  ?TempSrc:  Axillary  Axillary  ?SpO2: 95% 94% 94% 96%  ?Weight:      ?Height:      ? ?No data found. ? ? ?Intake/Output Summary (Last 24 hours) at 01/06/2022 1645 ?Last data filed at 01/06/2022 1633 ?Gross per 24 hour  ?Intake 580 ml  ?Output 875 ml  ?Net -295 ml  ? ?Filed Weights  ? 01/21/2022 1912  ?Weight: 68 kg  ? ? ?Exam: ? ?GEN: NAD ?SKIN: No rash ?EYES: EOMI ?ENT: MMM ?CV: RRR ?PULM: Decreased air entry bilaterally.  No wheezing or rales heard ?ABD: soft, ND, NT,  +BS ?CNS: AAO x 3, non focal ?EXT: No edema or tenderness ? ? ? ? ? ?  ? ? ?Data Reviewed:  ? ?I have personally reviewed following labs and imaging studies: ? ?Labs: ?Labs show the following:  ? ?Basic Metabolic Panel: ?Recent Labs  ?Lab 12/29/2021 ?1916 12/31/21 ?0507 01/01/22 ?1975 01/02/22 ?8832 01/06/22 ?0601  ?NA 135 140 138 140  139  ?K 3.5 4.1 4.2 4.1 4.3  ?CL 99 107 104 105 101  ?CO2 '24 25 27 27 30  '$ ?GLUCOSE 145* 150* 115* 141* 128*  ?BUN 21 22 37* 36* 32*  ?CREATININE 1.28* 1.01 1.19 0.99 1.03  ?CALCIUM 8.8* 8.4* 8.5* 8.4* 7.9*  ?MG  --   --  2.4  --   --   ? ?GFR ?Estimated Creatinine Clearance: 51.9 mL/min (by C-G formula based on SCr of 1.03 mg/dL). ?Liver Function Tests: ?No results for input(s): AST, ALT, ALKPHOS, BILITOT, PROT, ALBUMIN in the last 168 hours. ?No results for input(s): LIPASE, AMYLASE in the last 168 hours. ?No results for input(s): AMMONIA in the last 168 hours. ?Coagulation profile ?No results for input(s): INR, PROTIME in the last 168 hours. ? ?CBC: ?Recent Labs  ?Lab 12/24/2021 ?1916 12/31/21 ?7001 01/02/22 ?7494 01/03/22 ?4967 01/04/22 ?0407 01/05/22 ?5916 01/06/22 ?0601  ?WBC 11.6*   < > 11.5* 13.1* 16.6* 21.9* 25.8*  ?NEUTROABS 9.3*  --   --   --   --   --  23.5*  ?HGB 13.4   < > 11.7* 12.1* 12.0* 12.3* 12.2*  ?HCT 40.0   < > 36.1* 37.3* 36.9* 38.1* 37.6*  ?MCV 91.7   < > 95.0 94.7 94.1 94.5 94.5  ?PLT 217   < > 253 260 277 255 236  ? < > = values in this interval not displayed.  ? ?Cardiac Enzymes: ?No results for input(s): CKTOTAL, CKMB, CKMBINDEX, TROPONINI in the last 168 hours. ?BNP (last 3 results) ?No results for input(s): PROBNP in the last 8760 hours. ?CBG: ?No results for input(s): GLUCAP in the last 168 hours. ?D-Dimer: ?Recent Labs  ?  01/04/22 ?1640  ?DDIMER 2.66*  ? ?Hgb A1c: ?No results for input(s): HGBA1C in the last 72 hours. ?Lipid Profile: ?No results for input(s): CHOL, HDL, LDLCALC, TRIG, CHOLHDL, LDLDIRECT in the last 72 hours. ?Thyroid function studies: ?No  results for input(s): TSH, T4TOTAL, T3FREE, THYROIDAB in the last 72 hours. ? ?Invalid input(s): FREET3 ?Anemia work up: ?No results for input(s): VITAMINB12, FOLATE, FERRITIN, TIBC, IRON, RETICCTPCT in

## 2022-01-07 ENCOUNTER — Ambulatory Visit: Payer: Medicare Other

## 2022-01-07 DIAGNOSIS — Z7189 Other specified counseling: Secondary | ICD-10-CM

## 2022-01-07 DIAGNOSIS — R6520 Severe sepsis without septic shock: Secondary | ICD-10-CM | POA: Diagnosis not present

## 2022-01-07 DIAGNOSIS — J9611 Chronic respiratory failure with hypoxia: Secondary | ICD-10-CM | POA: Diagnosis not present

## 2022-01-07 DIAGNOSIS — J189 Pneumonia, unspecified organism: Secondary | ICD-10-CM | POA: Diagnosis not present

## 2022-01-07 DIAGNOSIS — J9621 Acute and chronic respiratory failure with hypoxia: Secondary | ICD-10-CM | POA: Diagnosis not present

## 2022-01-07 DIAGNOSIS — A419 Sepsis, unspecified organism: Secondary | ICD-10-CM | POA: Diagnosis not present

## 2022-01-07 LAB — C-REACTIVE PROTEIN: CRP: 30.5 mg/dL — ABNORMAL HIGH (ref <1.0)

## 2022-01-07 MED ORDER — IPRATROPIUM-ALBUTEROL 0.5-2.5 (3) MG/3ML IN SOLN
3.0000 mL | Freq: Four times a day (QID) | RESPIRATORY_TRACT | Status: DC
  Administered 2022-01-07 – 2022-01-09 (×10): 3 mL via RESPIRATORY_TRACT
  Filled 2022-01-07 (×11): qty 3

## 2022-01-07 NOTE — Progress Notes (Signed)
PT Cancellation Note ? ?Patient Details ?Name: Thomas Trujillo ?MRN: [id number] ?DOB: February 16, 1946 ? ? ?Cancelled Treatment:     Therapist in to see pt this am, Pt resting in bed and declining activity again, stating "I'm past PT, I'm not going to get any better".  Therapist offered to help with any needs/repositioning, pt declined. Call bell in reach. Will continue PT as tolerated, pt has refused 4 days in a row as of this date. ? ? ?Josie Dixon ?01/07/2022, 10:24 AM ?

## 2022-01-07 NOTE — Progress Notes (Signed)
PT Cancellation Note ? ?Patient Details ?Name: Thomas Trujillo ?MRN: [id number] ?DOB: 04/10/1946 ? ? ?Cancelled Treatment:    Reason Eval/Treat Not Completed: Other (comment) Per chart review pt has declined participation 4 days in a row. Per therapy protocol, will d/c current PT orders. Please re-consult if status changes or if pt is more willing to participate. ? ?Lavone Nian, PT, DPT ?01/07/22, 1:24 PM ? ? ?[name] ?01/07/2022, 1:24 PM ?

## 2022-01-07 NOTE — Progress Notes (Addendum)
? ? ? ?Progress Note  ? ? ?Thomas Trujillo  PTW:[id number] DOB: 04-05-1946  DOA: 12/29/2021 ?PCP: [name], MD  ? ? ? ? ?Brief Narrative:  ? ? ?Medical records reviewed and are as summarized below: ? ? ?Thomas Trujillo is a 76 year old male with history of BPH, hyperlipidemia, advanced COPD on 2 L/min home oxygen with activity only, history of CAD, prostate cancer soon to start on radiation therapy, history of NSTEMI, status post CABG x2, history of pneumothorax on the left, who presented to the ED on 12/23/2021 for evaluation of fevers and shortness of breath. ? ?Evaluation in the ED revealed severe sepsis secondary to community-acquired pneumonia complicated by acute respiratory failure with hypoxia requiring as much as 6 to 7 L/min oxygen. ? ?He was treated with empiric IV Rocephin and azithromycin. ? ? ? ? ? ? ? ?Assessment/Plan:  ? ?Principal Problem: ?  Severe sepsis ([place]) ?Active Problems: ?  Community acquired pneumonia ?  Acute on chronic respiratory failure with hypoxia (HCC) ?  COPD with acute exacerbation ([place]) ?  Chronic respiratory failure with hypoxia (HCC) ?  Chest pain, non-cardiac ?  Coronary artery disease involving native coronary artery of native heart without angina pectoris ?  Elevated troponin ?  Hyperlipidemia LDL goal <70 ?  Prostate cancer ([place]) ?  Positive blood culture ? ? ? ?Body mass index is 25.73 kg/m?. ? ? ?Severe sepsis secondary to community-acquired pneumonia, immunocompromised: CT chest on 01/04/2022 showed severe right upper lobar pneumonia.  Completed ceftriaxone azithromycin today. ? ?Acute on chronic hypoxic respiratory failure: He is on 5 L/min oxygen via [place].  Use BiPAP as needed.  He uses 2 to 4 L/min oxygen at home ? ?COPD exacerbation: Continue steroids and bronchodilators. ? ?Worsening leukocytosis: This is likely from steroids ? ?Elevated troponin: This is likely from demand ischemia. ? ?Staph epidermidis and staph hemolyticus bacteremia: This is likely from  contamination.  Repeat blood cultures from 12/31/2021 were negative. ? ?Prostate cancer: S/p prostate  gold seed marker procedure on 12/16/2021.  Continue Flomax.  He is supposed to start radiation therapy in the outpatient setting.  Follow-up with urologist, oncologist and radiation oncologist. ? ?Elevated troponin: This is likely from demand ischemia. ?Other comorbidities include hyperlipidemia, CAD ? ? ?Curt Bears, NP, from palliative care and I had a conference call with patient's family ([place], ex-wife and his 3 sons, Harlin Heys and [name]) in the patient's room to discuss diagnosis, prognosis and goals of care. ? ? ?Diet Order   ? ?       ?  Diet Heart Room service appropriate? Yes; Fluid consistency: Thin  Diet effective now       ?  ? ?  ?  ? ?  ? ? ? ? ? ? ?Consultants: ?Pulmonologist ?Palliative care ? ?Procedures: ?None ? ? ? ?Medications:  ? ? aspirin EC  81 mg Oral Daily  ? atorvastatin  40 mg Oral Daily  ? enoxaparin (LOVENOX) injection  40 mg Subcutaneous Q24H  ? ferrous sulfate  325 mg Oral Q breakfast  ? ipratropium-albuterol  3 mL Nebulization QID  ? methylPREDNISolone (SOLU-MEDROL) injection  40 mg Intravenous Q24H  ? montelukast  10 mg Oral QHS  ? multivitamin with minerals   Oral Daily  ? tamsulosin  0.4 mg Oral Daily  ? ?Continuous Infusions: ? sodium chloride Stopped (01/02/22 2225)  ? ? ? ?Anti-infectives (From admission, onward)  ? ? Start     Dose/Rate Route Frequency Ordered Stop  ?  01/05/22 1015  azithromycin (ZITHROMAX) tablet 500 mg       ? 500 mg Oral Daily 01/05/22 0923 01/06/22 0851  ? 01/05/22 1000  cefTRIAXone (ROCEPHIN) 1 g in sodium chloride 0.9 % 100 mL IVPB       ? 1 g ?200 mL/hr over 30 Minutes Intravenous Every 24 hours 01/05/22 0923 01/06/22 2040  ? 12/31/21 2200  azithromycin (ZITHROMAX) 500 mg in sodium chloride 0.9 % 250 mL IVPB  Status:  Discontinued       ? 500 mg ?250 mL/hr over 60 Minutes Intravenous Every 24 hours 01/14/2022 2035 12/31/21 1432  ? 12/31/21 2200   cefTRIAXone (ROCEPHIN) 2 g in sodium chloride 0.9 % 100 mL IVPB       ? 2 g ?200 mL/hr over 30 Minutes Intravenous Every 24 hours 12/23/2021 2035 01/03/22 2051  ? 12/31/21 2200  azithromycin (ZITHROMAX) tablet 500 mg       ? 500 mg Oral Daily at bedtime 12/31/21 1432 01/03/22 2007  ? 12/27/2021 2045  cefTRIAXone (ROCEPHIN) 1 g in sodium chloride 0.9 % 100 mL IVPB  Status:  Discontinued       ?Note to Pharmacy: Please total to 2 g IVPB today, thank you.  ? 1 g ?200 mL/hr over 30 Minutes Intravenous  Once 01/05/2022 2034 01/21/2022 2039  ? 01/12/2022 2015  azithromycin (ZITHROMAX) 500 mg in sodium chloride 0.9 % 250 mL IVPB       ? 500 mg ?250 mL/hr over 60 Minutes Intravenous  Once 01/02/2022 2009 01/07/2022 2231  ? 01/07/2022 2015  cefTRIAXone (ROCEPHIN) 1 g in sodium chloride 0.9 % 100 mL IVPB       ? 1 g ?200 mL/hr over 30 Minutes Intravenous  Once 01/12/2022 2009 12/28/2021 2052  ? ?  ? ? ? ? ? ? ? ? ? ?Family Communication/Anticipated D/C date and plan/Code Status  ? ?DVT prophylaxis: enoxaparin (LOVENOX) injection 40 mg Start: 01/19/2022 2200 ?Place TED hose Start: 12/27/2021 2032 ? ? ?  Code Status: DNR ? ?Family Communication: [name], son, over the phone ?Disposition Plan: Unable to determine at this time ? ? ?Status is: Inpatient ?Remains inpatient appropriate because: IV antibiotics ? ? ? ? ? ? ?Subjective:  ? ?He complains of shortness of breath and generalized weakness ? ?Objective:  ? ? ?Vitals:  ? 01/06/22 2000 01/06/22 2300 01/07/22 0359 01/07/22 0753  ?BP: (!) 122/52 94/61 (!) 103/57 (!) 102/49  ?Pulse: 94 90 89 (!) 102  ?Resp: '15 20 20 20  '$ ?Temp: 98.1 ?F (36.7 ?C) 98 ?F (36.7 ?C) 98.2 ?F (36.8 ?C) 98.1 ?F (36.7 ?C)  ?TempSrc: Oral Oral Oral Oral  ?SpO2: 95% 94% 92% 92%  ?Weight:      ?Height:      ? ?No data found. ? ? ?Intake/Output Summary (Last 24 hours) at 01/07/2022 1258 ?Last data filed at 01/07/2022 0900 ?Gross per 24 hour  ?Intake 480 ml  ?Output 400 ml  ?Net 80 ml  ? ?Filed Weights  ? 01/18/2022 1912  ?Weight: 68 kg   ? ? ?Exam: ? ?GEN: NAD ?SKIN: No rash ?EYES: EOMI ?ENT: MMM ?CV: RRR ?PULM: Decreased air entry bilaterally.  No wheezing or rales heard ?ABD: soft, ND, NT, +BS ?CNS: AAO x 3, non focal ?EXT: No edema or tenderness ? ? ? ? ? ?  ? ? ?Data Reviewed:  ? ?I have personally reviewed following labs and imaging studies: ? ?Labs: ?Labs show the following:  ? ?Basic Metabolic Panel: ?Recent  Labs  ?Lab 01/01/22 ?0616 01/02/22 ?0453 01/06/22 ?0601  ?NA 138 140 139  ?K 4.2 4.1 4.3  ?CL 104 105 101  ?CO2 '27 27 30  '$ ?GLUCOSE 115* 141* 128*  ?BUN 37* 36* 32*  ?CREATININE 1.19 0.99 1.03  ?CALCIUM 8.5* 8.4* 7.9*  ?MG 2.4  --   --   ? ?GFR ?Estimated Creatinine Clearance: 51.9 mL/min (by C-G formula based on SCr of 1.03 mg/dL). ?Liver Function Tests: ?No results for input(s): AST, ALT, ALKPHOS, BILITOT, PROT, ALBUMIN in the last 168 hours. ?No results for input(s): LIPASE, AMYLASE in the last 168 hours. ?No results for input(s): AMMONIA in the last 168 hours. ?Coagulation profile ?No results for input(s): INR, PROTIME in the last 168 hours. ? ?CBC: ?Recent Labs  ?Lab 01/02/22 ?0453 01/03/22 ?0259 01/04/22 ?0407 01/05/22 ?0441 01/06/22 ?0601  ?WBC 11.5* 13.1* 16.6* 21.9* 25.8*  ?NEUTROABS  --   --   --   --  23.5*  ?HGB 11.7* 12.1* 12.0* 12.3* 12.2*  ?HCT 36.1* 37.3* 36.9* 38.1* 37.6*  ?MCV 95.0 94.7 94.1 94.5 94.5  ?PLT 253 260 277 255 236  ? ?Cardiac Enzymes: ?No results for input(s): CKTOTAL, CKMB, CKMBINDEX, TROPONINI in the last 168 hours. ?BNP (last 3 results) ?No results for input(s): PROBNP in the last 8760 hours. ?CBG: ?No results for input(s): GLUCAP in the last 168 hours. ?D-Dimer: ?Recent Labs  ?  01/04/22 ?1640  ?DDIMER 2.66*  ? ?Hgb A1c: ?No results for input(s): HGBA1C in the last 72 hours. ?Lipid Profile: ?No results for input(s): CHOL, HDL, LDLCALC, TRIG, CHOLHDL, LDLDIRECT in the last 72 hours. ?Thyroid function studies: ?No results for input(s): TSH, T4TOTAL, T3FREE, THYROIDAB in the last 72 hours. ? ?Invalid  input(s): FREET3 ?Anemia work up: ?No results for input(s): VITAMINB12, FOLATE, FERRITIN, TIBC, IRON, RETICCTPCT in the last 72 hours. ?Sepsis Labs: ?Recent Labs  ?Lab 12/31/21 ?1437 01/01/22 ?2694 01/01/22 ?0616 0

## 2022-01-07 NOTE — Progress Notes (Signed)
? ? ? ?PULMONOLOGY ? ? ? ? ? ? ? ? ?Date: 01/07/2022,   ?MRN# [id number] Thomas Trujillo 1945/12/13 ? ? ?  ?AdmissionWeight: 68 kg                 ?CurrentWeight: 68 kg ? ? ?Referring physician: Dr Arbutus Ped ? ? ?CHIEF COMPLAINT:  ? ?Advanced COPD with refractory pneumonia ? ? ?HISTORY OF PRESENT ILLNESS  ? 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  He uses supplemental O2 at 2-3L/min at rest and 4L/min with exertion. He shares that he feels better with nebulizer therapy.  He was able to walk around slowly with PT but has not been able to do that over last 24h.  ? ?He has not slept well has had hicups for 48 hours.  We reviewed CT chest together.  ? ? ?01/07/22- patient is in conversation with palliative care regarding poor prognosis. I recommend hospice due to severe emphysema and pneumonia .  ? ?PAST MEDICAL HISTORY  ? ?Past Medical History:  ?Diagnosis Date  ? Bleb, lung ([place])   ? COPD (chronic obstructive pulmonary disease) ([place])   ? Coronary artery disease 02/14/2017  ? NSTEMI with urgent CABG (LIMA-LAD and SVG->ramus)  ? Hyperlipidemia   ? Ischemic cardiomyopathy   ? Lung nodule   ? Pneumothorax 05/2017  ? Left  ? Prostate cancer ([place])   ? ? ? ?SURGICAL HISTORY  ? ?Past Surgical History:  ?Procedure Laterality Date  ? CATARACT EXTRACTION, BILATERAL    ? CORONARY ARTERY BYPASS GRAFT N/A 02/14/2017  ? Procedure: CORONARY ARTERY BYPASS GRAFTING (CABG) x 2 , using left internal mammary artery and right leg greater saphenous vein harvested endoscopically LIMA-LAD, SVG-RAMUS;  Surgeon: [name], MD;  Location: [facility];  Service: Open Heart Surgery;  Laterality: N/A;  ? ENDOVEIN HARVEST OF GREATER SAPHENOUS VEIN Right 02/14/2017  ? Procedure: ENDOVEIN HARVEST OF RIGHT THIGH GREATER SAPHENOUS VEIN;  Surgeon: [name], MD;  Location: [facility];  Service: Open Heart Surgery;  Laterality: Right;  ? LEFT HEART CATH AND CORONARY ANGIOGRAPHY N/A 02/14/2017  ? Procedure: Left Heart Cath and Coronary Angiography;  Surgeon: [name], MD;  Location: [facility];  Service: Cardiovascular;  Laterality: N/A;  ? NASAL POLYP SURGERY    ? STAPLING OF BLEBS N/A 02/14/2017  ? Procedure: STAPLING OF LARGE LEFT UPPER LOBE PULMONARY BLEB;  Surgeon: [name], MD;  Location: [facility];  Service: Open Heart Surgery;  Laterality: N/A;  ? TEE WITHOUT CARDIOVERSION N/A 02/14/2017  ? Procedure: TRANSESOPHAGEAL ECHOCARDIOGRAM (TEE);  Surgeon: [name], MD;  Location: [facility];  Service: Open Heart Surgery;  Laterality: N/A;  ? VIDEO ASSISTED THORACOSCOPY (VATS) W/TALC PLEUADESIS N/A 11/23/2017  ? Procedure: VIDEO ASSISTED THORACOSCOPY (VATS) W/TALC PLEUADESIS;  Surgeon: Nestor Lewandowsky, MD;  Location: [facility];  Service: Thoracic;  Laterality: N/A;  ? ? ? ?FAMILY HISTORY  ? ?Family History  ?Problem Relation Age of Onset  ? Obesity Son   ? ? ? ?SOCIAL HISTORY  ? ?Social History  ? ?Tobacco Use  ? Smoking status: Former  ?  Types: Cigarettes  ?  Quit date: 04/06/2007  ?  Years since quitting: 14.7  ? Smokeless tobacco: Never  ?Vaping Use  ? Vaping Use: Never used  ?Substance Use Topics  ?  Alcohol use: No  ? Drug use: No  ? ? ? ?MEDICATIONS  ? ? ?Home Medication:  ?  ?Current Medication: ? ?Current Facility-Administered Medications:  ?  0.9 %  sodium chloride infusion, , Intravenous, PRN, Ezekiel Slocumb, DO, Stopped at 01/02/22 2225 ?  albuterol (PROVENTIL) (2.5 MG/3ML) 0.083% nebulizer solution 2.5 mg, 2.5 mg, Nebulization, Q3H PRN, Nicole Kindred A, DO, 2.5 mg at 01/03/22 2018 ?  aspirin EC tablet 81 mg, 81 mg, Oral, Daily, Cox, Amy N, DO, 81 mg at 01/07/22 0943 ?  atorvastatin (LIPITOR) tablet 40 mg, 40 mg, Oral, Daily, Cox, Amy N, DO, 40 mg at 01/07/22 0944 ?  chlorpheniramine-HYDROcodone 10-8 MG/5ML suspension 5 mL, 5 mL,  Oral, Q12H PRN, Nicole Kindred A, DO ?  enoxaparin (LOVENOX) injection 40 mg, 40 mg, Subcutaneous, Q24H, Cox, Amy N, DO, 40 mg at 01/06/22 2041 ?  ferrous sulfate tablet 325 mg, 325 mg, Oral, Q breakfast, Cox, Amy N, DO, 325 mg at 01/07/22 0944 ?  ipratropium-albuterol (DUONEB) 0.5-2.5 (3) MG/3ML nebulizer solution 3 mL, 3 mL, Nebulization, QID, Jennye Boroughs, MD, 3 mL at 01/07/22 1118 ?  lidocaine (LIDODERM) 5 % 1 patch, 1 patch, Transdermal, Daily PRN, Cox, Amy N, DO ?  methylPREDNISolone sodium succinate (SOLU-MEDROL) 40 mg/mL injection 40 mg, 40 mg, Intravenous, Q24H, Tiago Humphrey, MD, 40 mg at 01/06/22 1646 ?  montelukast (SINGULAIR) tablet 10 mg, 10 mg, Oral, QHS, Cox, Amy N, DO, 10 mg at 01/06/22 2041 ?  multivitamin with minerals tablet, , Oral, Daily, Cox, Amy N, DO, 1 tablet at 01/07/22 0944 ?  ondansetron (ZOFRAN) tablet 4 mg, 4 mg, Oral, Q6H PRN **OR** ondansetron (ZOFRAN) injection 4 mg, 4 mg, Intravenous, Q6H PRN, Cox, Amy N, DO ?  tamsulosin (FLOMAX) capsule 0.4 mg, 0.4 mg, Oral, Daily, Cox, Amy N, DO, 0.4 mg at 01/07/22 0944 ? ? ? ?ALLERGIES  ? ?No known allergies ? ? ? ? ?REVIEW OF SYSTEMS  ? ? ?Review of Systems: ? ?Gen:  Denies  fever, sweats, chills weigh loss  ?HEENT: Denies blurred vision, double vision, ear pain, eye pain, hearing loss, nose bleeds, sore throat ?Cardiac:  No dizziness, chest pain or heaviness, chest tightness,edema ?Resp:   Denies cough or sputum porduction, shortness of breath,wheezing, hemoptysis,  ?Gi: Denies swallowing difficulty, stomach pain, nausea or vomiting, diarrhea, constipation, bowel incontinence ?Gu:  Denies bladder incontinence, burning urine ?Ext:   Denies Joint pain, stiffness or swelling ?Skin: Denies  skin rash, easy bruising or bleeding or hives ?Endoc:  Denies polyuria, polydipsia , polyphagia or weight change ?Psych:   Denies depression, insomnia or hallucinations  ? ?Other:  All other systems negative ? ? ?VS: BP (!) 102/49 (BP Location: Right Arm)    Pulse (!) 102   Temp 98.1 ?F (36.7 ?C) (Oral)   Resp 20   Ht '5\' 4"'$  (1.626 m)   Wt 68 kg   SpO2 92%   BMI 25.73 kg/m?   ? ? ? ?PHYSICAL EXAM  ? ? ?GENERAL:NAD, no fevers, chills, no weakness no fatigue ?HEAD: Normocephalic, atraumatic.  ?EYES: Pupils equal, round, reactive to light. Extraocular muscles intact. No scleral icterus.  ?MOUTH: Moist mucosal membrane. Dentition intact. No abscess noted.  ?EAR, NOSE, THROAT: Clear without exudates. No external lesions.  ?NECK: Supple. No thyromegaly. No nodules. No JVD.  ?PULMONARY:rhonchi bilaterally worse at RUL zone ?CARDIOVASCULAR: S1 and S2. Regular rate and rhythm. No murmurs, rubs, or gallops. No edema. Pedal pulses 2+ bilaterally.  ?GASTROINTESTINAL: Soft, nontender,  nondistended. No masses. Positive bowel sounds. No hepatosplenomegaly.  ?MUSCULOSKELETAL: No swelling, clubbing, or edema. Range of motion full in all extremities.  ?NEUROLOGIC: Cranial nerves II through XII are intact. No gross focal neurological deficits. Sensation intact. Reflexes intact.  ?SKIN: No ulceration, lesions, rashes, or cyanosis. Skin warm and dry. Turgor intact.  ?PSYCHIATRIC: Mood, affect within normal limits. The patient is awake, alert and oriented x 3. Insight, judgment intact.  ? ? ?  ? ?IMAGING  ? ? ?CT Angio Chest Pulmonary Embolism (PE) W or WO Contrast ? ?Result Date: 01/04/2022 ?CLINICAL DATA:  Prostate cancer. Concern for pneumonia chest radiograph EXAM: CT ANGIOGRAPHY CHEST WITH CONTRAST TECHNIQUE: Multidetector CT imaging of the chest was performed using the standard protocol during bolus administration of intravenous contrast. Multiplanar CT image reconstructions and MIPs were obtained to evaluate the vascular anatomy. RADIATION DOSE REDUCTION: This exam was performed according to the departmental dose-optimization program which includes automated exposure control, adjustment of the mA and/or kV according to patient size and/or use of iterative reconstruction  technique. CONTRAST:  35m OMNIPAQUE IOHEXOL 350 MG/ML SOLN COMPARISON:  Chest radiograph same day FINDINGS: Cardiovascular: No filling defects within the pulmonary arteries to suggest acute pulmonary embolism. Pos

## 2022-01-07 NOTE — Progress Notes (Signed)
Progress note: ? ?Goals of care discussions planned for 12pm today, 3/17, at patient's bedside. ? ?Plan is to speak with patient's family over the phone to discuss goals, prognosis, and disposition. ? ?Thomas Trujillo. Sarajane Fambrough, DNP, FNP-BC ?Palliative Medicine Team ?Team Phone # [phone] ? ?NO CHARGE ?

## 2022-01-07 NOTE — Progress Notes (Addendum)
?                                                   ?Palliative Care Progress Note, Assessment & Plan  ? ?Patient Name: Thomas Trujillo       Date: 01/07/2022 ?DOB: October 01, 1946  Age: 76 y.o. MRN#: [id number] ?Attending Physician: Jennye Boroughs, MD ?Primary Care Physician: [name], MD ?Admit Date: 01/17/2022 ? ?Reason for Consultation/Follow-up: Establishing goals of care ? ?Subjective: ?Patient is sitting up in bed and attempting to take his p.o. morning medications.  Nasal cannula in place.  Patient acknowledges my presence and is able to make his needs known.  He is in no respiratory distress but is certainly having mild increased work of breathing.  He is able to speak but only in short sentences before needing to catch his breath. ? ?HPI: ?76 y.o. male  with past medical history of prostate cancer (radiation to start recently), BPH, HLD, advanced COPD (2 L [place] baseline at home, 4 L with exertion), CAD, NSTEMI with CABG x2, and history of left pneumothorax admitted on 12/29/2021 with fever and shortness of breath.  Patient is being treated for community-acquired pneumonia.  On night of 3/15, patient was transferred to progressive care due to increasing shortness of breath and use of BiPAP.  ? ?Summary of counseling/coordination of care: ?After reviewing the patient's chart and assessing the patient at bedside, I spoke with the patient regarding prognosis, goals of care, and disposition options. ? ?Patient shares that he is not going to get any better and that the doctors are talking about hospice.  He asked what that would look like for him.  I shared that hospice focuses on quality of life with compassionate and dignified care.  The goal would be to keep him clean, dry, warm, and minimize symptoms of breathlessness, increased work of breathing, agitation, air hunger,  anxiety, nausea/vomiting, and pain.  ? ?Patient says he would like to speak with his family and have them involved in these decisions.  Patient stated he would like his sons Sharlene Dory, and Harrell Gave as well as his ex-wife [place] to be informed of his current medical state.  I offered to facilitate a conference call with these family members to ensure they all have a full understanding of his prognosis.  Patient was in agreement and asked that I schedule this conference call for sometime today. ? ?I highlighted to patient that the conference call will be for informational purposes for his family and also for him to be able to voice what is important to him, the goals of his care, and what he would like his plan of care to look like moving forward.  T ? ?Therapeutic silence, active listening, and emotional support given  to patient. ? ?Plan is set for goals of care discussion with family via telephone in patient's room at 12 noon today, 3/17 ? ?1300: [place] discussion had at bedside with patient, this provider, and Dr. Mal Misty with patient's ex-wife [place] and three sons Harlin Heys, and [name] conferenced in via telephone. Medical update given by Dr. Mal Misty. Family asked appropriate questions.  ? ?Family plans to make arrangements to come to [place] to see patient over the weekend.  ? ?DNR remains. Pt again agreed with allowing a natural death and avoiding Code blue/CPR/ mechanical ventilation. ? ?Pt made no  changes to plan of care but shared he does not "see it happening" for him to go home or to [place].  ? ?Plan is to monitor patient throughout the weekend and wait for family to arrive to further discuss disposition and options.  ? ?Code Status: ?DNR ? ?Prognosis: ?< 6 months ? ?Discharge Planning: ?To Be Determined ? ?Care plan was discussed with patient, patient's son [name], RN Shonna Chock ? ?Physical Exam ?Vitals reviewed.  ?Constitutional:   ?   General: He is not in acute distress. ?   Appearance: He is  not toxic-appearing.  ?Cardiovascular:  ?   Rate and Rhythm: Normal rate.  ?Pulmonary:  ?   Comments: [place] in place, SOB with speaking more than a few sentences ?Abdominal:  ?   Palpations: Abdomen is soft.  ?Musculoskeletal:  ?   Comments: Generalized weakness  ?Skin: ?   General: Skin is warm and dry.  ?Neurological:  ?   Mental Status: He is alert and oriented to person, place, and time.  ?Psychiatric:     ?   Mood and Affect: Mood normal. Mood is not anxious.     ?   Behavior: Behavior normal. Behavior is not agitated.  ?         ? ?Palliative Assessment/Data: 40% ? ? ? ?Total Time 60 minutes  ?Greater than 50%  of this time was spent counseling and coordinating care related to the above assessment and plan. ? ?Thank you for allowing the Palliative Medicine Team to assist in the care of this patient. ? ?Verdell Carmine. Siriyah Ambrosius, DNP, FNP-BC ?Palliative Medicine Team ?Team Phone # [phone] ?  ?

## 2022-01-08 DIAGNOSIS — R6520 Severe sepsis without septic shock: Secondary | ICD-10-CM | POA: Diagnosis not present

## 2022-01-08 DIAGNOSIS — A419 Sepsis, unspecified organism: Secondary | ICD-10-CM | POA: Diagnosis not present

## 2022-01-08 DIAGNOSIS — J9621 Acute and chronic respiratory failure with hypoxia: Secondary | ICD-10-CM | POA: Diagnosis not present

## 2022-01-08 LAB — CULTURE, RESPIRATORY W GRAM STAIN
Culture: NORMAL
Gram Stain: NONE SEEN

## 2022-01-08 LAB — C-REACTIVE PROTEIN: CRP: 21.8 mg/dL — ABNORMAL HIGH (ref <1.0)

## 2022-01-08 MED ORDER — MORPHINE SULFATE (PF) 4 MG/ML IV SOLN: 4.0000 mg | Freq: Four times a day (QID) | INTRAVENOUS | Status: DC | PRN

## 2022-01-08 MED ORDER — SALINE SPRAY 0.65 % NA SOLN
1.0000 | NASAL | Status: DC | PRN
  Administered 2022-01-08: 1 via NASAL
  Filled 2022-01-08: qty 44

## 2022-01-08 MED ORDER — MORPHINE SULFATE (PF) 2 MG/ML IV SOLN
2.0000 mg | Freq: Three times a day (TID) | INTRAVENOUS | Status: DC | PRN
  Administered 2022-01-09 (×2): 2 mg via INTRAVENOUS
  Filled 2022-01-08 (×2): qty 1

## 2022-01-08 MED ORDER — LORAZEPAM 2 MG/ML IJ SOLN: 1.0000 mg | Freq: Four times a day (QID) | INTRAMUSCULAR | Status: DC | PRN

## 2022-01-08 MED ORDER — LORAZEPAM 0.5 MG PO TABS
0.5000 mg | ORAL_TABLET | Freq: Four times a day (QID) | ORAL | Status: DC | PRN
  Administered 2022-01-08 – 2022-01-09 (×2): 0.5 mg via ORAL
  Filled 2022-01-08 (×2): qty 1

## 2022-01-08 MED ORDER — GUAIFENESIN-DM 100-10 MG/5ML PO SYRP
5.0000 mL | ORAL_SOLUTION | Freq: Four times a day (QID) | ORAL | Status: DC | PRN
  Administered 2022-01-08 (×2): 5 mL via ORAL
  Filled 2022-01-08 (×2): qty 10

## 2022-01-08 NOTE — Progress Notes (Addendum)
? ? ? ?Progress Note  ? ? ?Thomas Trujillo  YJE:[id number] DOB: 1945-12-02  DOA: 01/09/2022 ?PCP: [name], MD  ? ? ? ? ?Brief Narrative:  ? ? ?Medical records reviewed and are as summarized below: ? ? ?Thomas Trujillo is a 76 year old male with history of BPH, hyperlipidemia, advanced COPD on 2 L/min home oxygen with activity only, history of CAD, prostate cancer soon to start on radiation therapy, history of NSTEMI, status post CABG x2, history of pneumothorax on the left, who presented to the ED on 01/15/2022 for evaluation of fevers and shortness of breath. ? ?Evaluation in the ED revealed severe sepsis secondary to community-acquired pneumonia complicated by acute respiratory failure with hypoxia requiring as much as 6 to 7 L/min oxygen. ? ?He was treated with empiric IV Rocephin and azithromycin. ? ? ? ? ? ? ? ?Assessment/Plan:  ? ?Principal Problem: ?  Severe sepsis ([place]) ?Active Problems: ?  Community acquired pneumonia ?  Acute on chronic respiratory failure with hypoxia ([facility]) ?  COPD with acute exacerbation ([place]) ?  Chronic respiratory failure with hypoxia ([facility]) ?  Chest pain, non-cardiac ?  Coronary artery disease involving native coronary artery of native heart without angina pectoris ?  Elevated troponin ?  Hyperlipidemia LDL goal <70 ?  Prostate cancer ([place]) ?  Positive blood culture ? ? ? ?Body mass index is 25.73 kg/m?. ? ? ?Severe sepsis secondary to community-acquired pneumonia, immunocompromised: CT chest on 01/04/2022 showed severe right upper lobar pneumonia.  He completed IV ceftriaxone and azithromycin on 01/07/2022 ? ?Acute on chronic hypoxic respiratory failure: He is now requiring assist with hospital minute oxygen.  Use BiPAP as needed.  He uses 2 to 4 L/min oxygen at home ? ?COPD exacerbation: Continue steroids and bronchodilators ? ?Worsening leukocytosis: This is likely from steroids ? ?Elevated troponin: This is likely from demand ischemia. ? ?Staph epidermidis and staph hemolyticus  bacteremia: This is likely from contamination.  Repeat blood cultures from 12/31/2021 were negative. ? ?Prostate cancer: S/p prostate  gold seed marker procedure on 12/16/2021.  Continue Flomax.  He is supposed to start radiation therapy in the outpatient setting.  Follow-up with urologist, oncologist and radiation oncologist. ? ?Elevated troponin: This is likely from demand ischemia. ?Other comorbidities include hyperlipidemia, CAD ? ? ?I met with his family (3 sons and ex-wife) at the bedside.  Diagnoses, prognosis and goals of care were discussed.  They are not ready for comfort care.  Family plans to transport him in an ambulance to [place].  Follow-up.  Social worker to assist with disposition. ? ? ? ?Diet Order   ? ?       ?  Diet Heart Room service appropriate? Yes; Fluid consistency: Thin  Diet effective now       ?  ? ?  ?  ? ?  ? ? ? ? ? ? ?Consultants: ?Pulmonologist ?Palliative care ? ?Procedures: ?None ? ? ? ?Medications:  ? ? aspirin EC  81 mg Oral Daily  ? atorvastatin  40 mg Oral Daily  ? enoxaparin (LOVENOX) injection  40 mg Subcutaneous Q24H  ? ferrous sulfate  325 mg Oral Q breakfast  ? ipratropium-albuterol  3 mL Nebulization QID  ? montelukast  10 mg Oral QHS  ? multivitamin with minerals   Oral Daily  ? tamsulosin  0.4 mg Oral Daily  ? ?Continuous Infusions: ? sodium chloride Stopped (01/02/22 2225)  ? ? ? ?Anti-infectives (From admission, onward)  ? ?  Start     Dose/Rate Route Frequency Ordered Stop  ? 01/05/22 1015  azithromycin (ZITHROMAX) tablet 500 mg       ? 500 mg Oral Daily 01/05/22 0923 01/06/22 0851  ? 01/05/22 1000  cefTRIAXone (ROCEPHIN) 1 g in sodium chloride 0.9 % 100 mL IVPB       ? 1 g ?200 mL/hr over 30 Minutes Intravenous Every 24 hours 01/05/22 0923 01/06/22 2040  ? 12/31/21 2200  azithromycin (ZITHROMAX) 500 mg in sodium chloride 0.9 % 250 mL IVPB  Status:  Discontinued       ? 500 mg ?250 mL/hr over 60 Minutes Intravenous Every 24 hours 12/25/2021 2035 12/31/21 1432  ?  12/31/21 2200  cefTRIAXone (ROCEPHIN) 2 g in sodium chloride 0.9 % 100 mL IVPB       ? 2 g ?200 mL/hr over 30 Minutes Intravenous Every 24 hours 01/18/2022 2035 01/03/22 2051  ? 12/31/21 2200  azithromycin (ZITHROMAX) tablet 500 mg       ? 500 mg Oral Daily at bedtime 12/31/21 1432 01/03/22 2007  ? 01/15/2022 2045  cefTRIAXone (ROCEPHIN) 1 g in sodium chloride 0.9 % 100 mL IVPB  Status:  Discontinued       ?Note to Pharmacy: Please total to 2 g IVPB today, thank you.  ? 1 g ?200 mL/hr over 30 Minutes Intravenous  Once 01/15/2022 2034 01/13/2022 2039  ? 01/20/2022 2015  azithromycin (ZITHROMAX) 500 mg in sodium chloride 0.9 % 250 mL IVPB       ? 500 mg ?250 mL/hr over 60 Minutes Intravenous  Once 01/05/2022 2009 01/17/2022 2231  ? 01/21/2022 2015  cefTRIAXone (ROCEPHIN) 1 g in sodium chloride 0.9 % 100 mL IVPB       ? 1 g ?200 mL/hr over 30 Minutes Intravenous  Once 01/17/2022 2009 12/28/2021 2052  ? ?  ? ? ? ? ? ? ? ? ? ?Family Communication/Anticipated D/C date and plan/Code Status  ? ?DVT prophylaxis: enoxaparin (LOVENOX) injection 40 mg Start: 01/17/2022 2200 ?Place TED hose Start: 01/13/2022 2032 ? ? ?  Code Status: DNR ? ?Family Communication: Sons and ex wife at the bedside ?Disposition Plan: Unable to determine at this time ? ? ?Status is: Inpatient ?Remains inpatient appropriate because: IV antibiotics ? ? ? ? ? ? ?Subjective:  ? ?Interval events noted.  He complains of shortness of breath and cough productive of bloody sputum. ? ?Objective:  ? ? ?Vitals:  ? 01/08/22 0400 01/08/22 0743 01/08/22 0754 01/08/22 1108  ?BP: (!) 122/53  (!) 129/54 (!) 133/57  ?Pulse: 82  84 93  ?Resp: '18  12 17  ' ?Temp: 98.6 ?F (37 ?C)  98 ?F (36.7 ?C) 98.4 ?F (36.9 ?C)  ?TempSrc: Oral  Oral Oral  ?SpO2: 94% 95% 94% 92%  ?Weight:      ?Height:      ? ?No data found. ? ? ?Intake/Output Summary (Last 24 hours) at 01/08/2022 1504 ?Last data filed at 01/08/2022 1415 ?Gross per 24 hour  ?Intake --  ?Output 600 ml  ?Net -600 ml  ? ?Filed Weights  ? 12/22/2021 1912   ?Weight: 68 kg  ? ? ?Exam: ? ?GEN: NAD ?SKIN: Warm and dry ?EYES: No pallor or icterus ?ENT: MMM ?CV: RRR ?PULM: Decreased air entry bilaterally.  No wheezing or rales heard ?ABD: soft, ND, NT, +BS ?CNS: AAO x 3, non focal ?EXT: No edema or tenderness ? ? ? ? ?  ? ? ?Data Reviewed:  ? ?I have  personally reviewed following labs and imaging studies: ? ?Labs: ?Labs show the following:  ? ?Basic Metabolic Panel: ?Recent Labs  ?Lab 01/02/22 ?9381 01/06/22 ?0601  ?NA 140 139  ?K 4.1 4.3  ?CL 105 101  ?CO2 27 30  ?GLUCOSE 141* 128*  ?BUN 36* 32*  ?CREATININE 0.99 1.03  ?CALCIUM 8.4* 7.9*  ? ?GFR ?Estimated Creatinine Clearance: 51.9 mL/min (by C-G formula based on SCr of 1.03 mg/dL). ?Liver Function Tests: ?No results for input(s): AST, ALT, ALKPHOS, BILITOT, PROT, ALBUMIN in the last 168 hours. ?No results for input(s): LIPASE, AMYLASE in the last 168 hours. ?No results for input(s): AMMONIA in the last 168 hours. ?Coagulation profile ?No results for input(s): INR, PROTIME in the last 168 hours. ? ?CBC: ?Recent Labs  ?Lab 01/02/22 ?0453 01/03/22 ?0259 01/04/22 ?0407 01/05/22 ?0441 01/06/22 ?0601  ?WBC 11.5* 13.1* 16.6* 21.9* 25.8*  ?NEUTROABS  --   --   --   --  23.5*  ?HGB 11.7* 12.1* 12.0* 12.3* 12.2*  ?HCT 36.1* 37.3* 36.9* 38.1* 37.6*  ?MCV 95.0 94.7 94.1 94.5 94.5  ?PLT 253 260 277 255 236  ? ?Cardiac Enzymes: ?No results for input(s): CKTOTAL, CKMB, CKMBINDEX, TROPONINI in the last 168 hours. ?BNP (last 3 results) ?No results for input(s): PROBNP in the last 8760 hours. ?CBG: ?No results for input(s): GLUCAP in the last 168 hours. ?D-Dimer: ?No results for input(s): DDIMER in the last 72 hours. ? ?Hgb A1c: ?No results for input(s): HGBA1C in the last 72 hours. ?Lipid Profile: ?No results for input(s): CHOL, HDL, LDLCALC, TRIG, CHOLHDL, LDLDIRECT in the last 72 hours. ?Thyroid function studies: ?No results for input(s): TSH, T4TOTAL, T3FREE, THYROIDAB in the last 72 hours. ? ?Invalid input(s): FREET3 ?Anemia work  up: ?No results for input(s): VITAMINB12, FOLATE, FERRITIN, TIBC, IRON, RETICCTPCT in the last 72 hours. ?Sepsis Labs: ?Recent Labs  ?Lab 01/02/22 ?0453 01/03/22 ?0259 01/04/22 ?0407 01/04/22 ?1640 01/05/22 ?044

## 2022-01-08 NOTE — TOC Progression Note (Signed)
Transition of Care (TOC) - Progression Note  ? ? ?Patient Details  ?Name: Thomas Trujillo ?MRN: [id number] ?Date of Birth: 26-Jun-1946 ? ?Transition of Care (TOC) CM/SW Contact  ?Alberteen Sam, LCSW ?Phone Number: ?01/08/2022, 1:58 PM ? ?Clinical Narrative:    ? ?CSW notes plan is for home with hospice with patient and family interested in patient going to [place] at [address] [place] [address].  ? ?CSW to follow up with son Thomas Trujillo on quotes for transport in ambulance.  ? ?CSW has reached out to [facility] at [phone] they report they are unable to meet patient's needs.  ? ?CSW has reached out to [facility] at [phone] they request call back on Monday to obtain quote and potentially schedule transport.  ? ? ? ?Expected Discharge Plan: [name] ?Barriers to Discharge: Continued Medical Work up ? ?Expected Discharge Plan and Services ?Expected Discharge Plan: [place] ?  ?Discharge Planning Services: CM Consult ?Post Acute Care Choice: Home Health ?Living arrangements for the past 2 months: [place] ?                ?  ?  ?  ?  ?  ?HH Arranged: Therapist, sports, PT ?[facility] Agency: [facility] ?Date HH Agency Contacted: 01/04/22 ?  ?Representative spoke with at [place]: Malachy Mood ? ? ?Social Determinants of Health (SDOH) Interventions ?  ? ?Readmission Risk Interventions ?No flowsheet data found. ? ?

## 2022-01-08 NOTE — Progress Notes (Signed)
? ? ? ?PULMONOLOGY ? ? ? ? ? ? ? ? ?Date: 01/08/2022,   ?MRN# [id number] Thomas Trujillo 1946/07/30 ? ? ?  ?AdmissionWeight: 68 kg                 ?CurrentWeight: 68 kg ? ? ?Referring physician: Dr Arbutus Ped ? ? ?CHIEF COMPLAINT:  ? ?Advanced COPD with refractory pneumonia ? ? ?HISTORY OF PRESENT ILLNESS  ? 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  He uses supplemental O2 at 2-3L/min at rest and 4L/min with exertion. He shares that he feels better with nebulizer therapy.  He was able to walk around slowly with PT but has not been able to do that over last 24h.  ? ?He has not slept well has had hicups for 48 hours.  We reviewed CT chest together.  ? ? ?01/08/22 - family meeting today.  Present during meeting with me in person is Damaso Laday, [address], [address] [place], [place]. We discussed poor prognosis and hospice. Family agrees to hospice but wants him to come home in [place].  I have offered to meet with case management for additional resources to help accommodate request.  ? ?PAST MEDICAL HISTORY  ? ?Past Medical History:  ?Diagnosis Date  ? 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 COPD (chronic obstructive pulmonary disease) ([place])   ? Coronary artery disease 02/14/2017  ? NSTEMI with urgent CABG (LIMA-LAD and SVG->ramus)  ? Hyperlipidemia   ? Ischemic cardiomyopathy   ? Lung nodule   ? Pneumothorax 05/2017  ? Left  ? Prostate cancer ([place])   ? ? ? ?SURGICAL HISTORY  ? ?Past Surgical History:  ?Procedure Laterality Date  ? CATARACT EXTRACTION, BILATERAL    ? CORONARY ARTERY BYPASS GRAFT N/A 02/14/2017  ? Procedure: CORONARY ARTERY BYPASS GRAFTING (CABG) x 2 , using left internal mammary artery and right leg greater saphenous vein harvested endoscopically LIMA-LAD, SVG-RAMUS;  Surgeon:  [name], MD;  Location: [facility];  Service: Open Heart Surgery;  Laterality: N/A;  ? ENDOVEIN HARVEST OF GREATER SAPHENOUS VEIN Right 02/14/2017  ? Procedure: ENDOVEIN HARVEST OF RIGHT THIGH GREATER SAPHENOUS VEIN;  Surgeon: [name], MD;  Location: [facility];  Service: Open Heart Surgery;  Laterality: Right;  ? LEFT HEART CATH AND CORONARY ANGIOGRAPHY N/A 02/14/2017  ? Procedure: Left Heart Cath and Coronary Angiography;  Surgeon: [name], MD;  Location: [facility];  Service: Cardiovascular;  Laterality: N/A;  ? NASAL POLYP SURGERY    ? STAPLING OF BLEBS N/A 02/14/2017  ? Procedure: STAPLING OF LARGE LEFT UPPER LOBE PULMONARY BLEB;  Surgeon: [name], MD;  Location: [facility];  Service: Open Heart Surgery;  Laterality: N/A;  ? TEE WITHOUT CARDIOVERSION N/A 02/14/2017  ? Procedure: TRANSESOPHAGEAL ECHOCARDIOGRAM (TEE);  Surgeon: [name], MD;  Location: [facility];  Service: Open Heart Surgery;  Laterality: N/A;  ? VIDEO ASSISTED THORACOSCOPY (VATS) W/TALC PLEUADESIS N/A 11/23/2017  ? Procedure: VIDEO ASSISTED THORACOSCOPY (VATS) W/TALC PLEUADESIS;  Surgeon: Nestor Lewandowsky, MD;  Location: [facility];  Service: Thoracic;  Laterality: N/A;  ? ? ? ?FAMILY HISTORY  ? ?Family History  ?Problem Relation Age of Onset  ? Obesity Son   ? ? ? ?SOCIAL HISTORY  ? ?Social History  ? ?Tobacco Use  ? Smoking  status: Former  ?  Types: Cigarettes  ?  Quit date: 04/06/2007  ?  Years since quitting: 14.7  ? Smokeless tobacco: Never  ?Vaping Use  ? Vaping Use: Never used  ?Substance Use Topics  ? Alcohol use: No  ? Drug use: No  ? ? ? ?MEDICATIONS  ? ? ?Home Medication:  ?  ?Current Medication: ? ?Current Facility-Administered Medications:  ?  0.9 %  sodium chloride infusion, , Intravenous, PRN, Ezekiel Slocumb, DO, Stopped at 01/02/22 2225 ?  albuterol (PROVENTIL) (2.5 MG/3ML) 0.083% nebulizer solution 2.5 mg, 2.5 mg, Nebulization, Q3H PRN, Nicole Kindred A, DO, 2.5 mg at 01/08/22 0228 ?  aspirin  EC tablet 81 mg, 81 mg, Oral, Daily, Cox, Amy N, DO, 81 mg at 01/08/22 0855 ?  atorvastatin (LIPITOR) tablet 40 mg, 40 mg, Oral, Daily, Cox, Amy N, DO, 40 mg at 01/08/22 0856 ?  chlorpheniramine-HYDROcodone 10-8 MG/5ML suspension 5 mL, 5 mL, Oral, Q12H PRN, Nicole Kindred A, DO, 5 mL at 01/08/22 0156 ?  enoxaparin (LOVENOX) injection 40 mg, 40 mg, Subcutaneous, Q24H, Cox, Amy N, DO, 40 mg at 01/07/22 2122 ?  ferrous sulfate tablet 325 mg, 325 mg, Oral, Q breakfast, Cox, Amy N, DO, 325 mg at 01/08/22 0855 ?  guaiFENesin-dextromethorphan (ROBITUSSIN DM) 100-10 MG/5ML syrup 5 mL, 5 mL, Oral, Q6H PRN, Jennye Boroughs, MD, 5 mL at 01/08/22 1030 ?  ipratropium-albuterol (DUONEB) 0.5-2.5 (3) MG/3ML nebulizer solution 3 mL, 3 mL, Nebulization, QID, Jennye Boroughs, MD, 3 mL at 01/08/22 0743 ?  lidocaine (LIDODERM) 5 % 1 patch, 1 patch, Transdermal, Daily PRN, Cox, Amy N, DO ?  montelukast (SINGULAIR) tablet 10 mg, 10 mg, Oral, QHS, Cox, Amy N, DO, 10 mg at 01/07/22 2122 ?  multivitamin with minerals tablet, , Oral, Daily, Cox, Amy N, DO, 1 tablet at 01/08/22 0856 ?  ondansetron (ZOFRAN) tablet 4 mg, 4 mg, Oral, Q6H PRN **OR** ondansetron (ZOFRAN) injection 4 mg, 4 mg, Intravenous, Q6H PRN, Cox, Amy N, DO ?  tamsulosin (FLOMAX) capsule 0.4 mg, 0.4 mg, Oral, Daily, Cox, Amy N, DO, 0.4 mg at 01/08/22 7416 ? ? ? ?ALLERGIES  ? ?No known allergies ? ? ? ? ?REVIEW OF SYSTEMS  ? ? ?Review of Systems: ? ?Gen:  Denies  fever, sweats, chills weigh loss  ?HEENT: Denies blurred vision, double vision, ear pain, eye pain, hearing loss, nose bleeds, sore throat ?Cardiac:  No dizziness, chest pain or heaviness, chest tightness,edema ?Resp:   Denies cough or sputum porduction, shortness of breath,wheezing, hemoptysis,  ?Gi: Denies swallowing difficulty, stomach pain, nausea or vomiting, diarrhea, constipation, bowel incontinence ?Gu:  Denies bladder incontinence, burning urine ?Ext:   Denies Joint pain, stiffness or swelling ?Skin: Denies   skin rash, easy bruising or bleeding or hives ?Endoc:  Denies polyuria, polydipsia , polyphagia or weight change ?Psych:   Denies depression, insomnia or hallucinations  ? ?Other:  All other systems negative ? ? ?VS: BP (!) 133/57 (BP Location: Right Arm)   Pulse 93   Temp 98.4 ?F (36.9 ?C) (Oral)   Resp 17   Ht '5\' 4"'$  (1.626 m)   Wt 68 kg   SpO2 92%   BMI 25.73 kg/m?   ? ? ? ?PHYSICAL EXAM  ? ? ?GENERAL:NAD, no fevers, chills, no weakness no fatigue ?HEAD: Normocephalic, atraumatic.  ?EYES: Pupils equal, round, reactive to light. Extraocular muscles intact. No scleral icterus.  ?MOUTH: Moist mucosal membrane. Dentition intact. No abscess noted.  ?EAR, NOSE, THROAT: Clear  without exudates. No external lesions.  ?NECK: Supple. No thyromegaly. No nodules. No JVD.  ?PULMONARY:rhonchi bilaterally worse at RUL zone ?CARDIOVASCULAR: S1 and S2. Regular rate and rhythm. No murmurs, rubs, or gallops. No edema. Pedal pulses 2+ bilaterally.  ?GASTROINTESTINAL: Soft, nontender, nondistended. No masses. Positive bowel sounds. No hepatosplenomegaly.  ?MUSCULOSKELETAL: No swelling, clubbing, or edema. Range of motion full in all extremities.  ?NEUROLOGIC: Cranial nerves II through XII are intact. No gross focal neurological deficits. Sensation intact. Reflexes intact.  ?SKIN: No ulceration, lesions, rashes, or cyanosis. Skin warm and dry. Turgor intact.  ?PSYCHIATRIC: Mood, affect within normal limits. The patient is awake, alert and oriented x 3. Insight, judgment intact.  ? ? ?  ? ?IMAGING  ? ? ?CT Angio Chest Pulmonary Embolism (PE) W or WO Contrast ? ?Result Date: 01/04/2022 ?CLINICAL DATA:  Prostate cancer. Concern for pneumonia chest radiograph EXAM: CT ANGIOGRAPHY CHEST WITH CONTRAST TECHNIQUE: Multidetector CT imaging of the chest was performed using the standard protocol during bolus administration of intravenous contrast. Multiplanar CT image reconstructions and MIPs were obtained to evaluate the vascular anatomy.  RADIATION DOSE REDUCTION: This exam was performed according to the departmental dose-optimization program which includes automated exposure control, adjustment of the mA and/or kV according to patient size and/or u

## 2022-01-08 NOTE — Progress Notes (Signed)
Prn given for sob ?

## 2022-01-08 NOTE — Progress Notes (Signed)
Manufacturing engineer (ACC) ? ?Request received to meet with family to discuss possible discharge options for hospice in [place]. ? ?Met with pt, several of his children, and his ex-wife at the bedside. They shared that Thomas Trujillo moved down here a few years ago to help care for his son, who has since passed, leaving Thomas Trujillo in [place] with no local family. ? ?The patient would desperately like to discharge back to [place] with his family.  ? ?Previously, he used O2 PRN for activity at home. Currently, he is on 6 lpm continuous and intermittently BiPAP, although he refused last night.  ? ?The drive home is 9 hours. Discussed possibility of returning home via POV, but pt says he cannot make the drive as a passenger, he would need ambulance transport home. ? ?Should he discharge, he will d/c to: [address] [place] [address]. Son Thomas Trujillo says they will be able to cover cost of transport but need help with arranging. ? ?Hospice should be set up in [place] for when he arrives so there is no delay in continuing full comfort measures. ? ?Continued Arp conversations would be beneficial as 9 hours is a long transport time.  ? ?Updated hospital team via epic chat.  ? ?Please reach out with any questions. ?Venia Carbon BSN, RN ?[facility] Liaison  ?  ?

## 2022-01-09 DIAGNOSIS — A419 Sepsis, unspecified organism: Secondary | ICD-10-CM | POA: Diagnosis not present

## 2022-01-09 DIAGNOSIS — C61 Malignant neoplasm of prostate: Secondary | ICD-10-CM | POA: Diagnosis not present

## 2022-01-09 DIAGNOSIS — J441 Chronic obstructive pulmonary disease with (acute) exacerbation: Secondary | ICD-10-CM | POA: Diagnosis not present

## 2022-01-09 DIAGNOSIS — J9621 Acute and chronic respiratory failure with hypoxia: Secondary | ICD-10-CM | POA: Diagnosis not present

## 2022-01-09 LAB — C-REACTIVE PROTEIN: CRP: 28 mg/dL — ABNORMAL HIGH (ref <1.0)

## 2022-01-09 MED ORDER — LORAZEPAM 2 MG/ML IJ SOLN
1.0000 mg | INTRAMUSCULAR | Status: DC | PRN
  Administered 2022-01-09: 1 mg via INTRAVENOUS
  Filled 2022-01-09: qty 1

## 2022-01-09 MED ORDER — MORPHINE SULFATE (PF) 2 MG/ML IV SOLN: 2.0000 mg | INTRAVENOUS | Status: DC | PRN

## 2022-01-09 MED ORDER — MORPHINE 100MG IN NS 100ML (1MG/ML) PREMIX INFUSION
2.0000 mg/h | INTRAVENOUS | Status: DC
  Administered 2022-01-09: 2 mg/h via INTRAVENOUS
  Filled 2022-01-09: qty 100

## 2022-01-09 NOTE — Progress Notes (Signed)
Family at bedside, patient and family are now agreeable to making patient comfort care. Dr. Mal Misty was paged and attempted to call. No response at this time.  ?

## 2022-01-09 NOTE — Progress Notes (Signed)
Chaplain Maggie met with patient and family at bedside for prayer and spiritual support. A phone message was left at [phone] for a [employer] from [facility] as patient and family are interested in the [place]. Chaplain offered hospitality and ministry of presence to family as pt is actively dying and is available for continued support through On Call Chaplain at [phone]. Chaplain expect to follow up. ?

## 2022-01-09 NOTE — Progress Notes (Signed)
? ? ? ?PULMONOLOGY ? ? ? ? ? ? ? ? ?Date: 01/09/2022,   ?MRN# [id number] Thomas Trujillo 1946-04-30 ? ? ?  ?AdmissionWeight: 68 kg                 ?CurrentWeight: 68 kg ? ? ?Referring physician: Dr Arbutus Ped ? ? ?CHIEF COMPLAINT:  ? ?Advanced COPD with refractory pneumonia ? ? ?HISTORY OF PRESENT ILLNESS  ? 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  He uses supplemental O2 at 2-3L/min at rest and 4L/min with exertion. He shares that he feels better with nebulizer therapy.  He was able to walk around slowly with PT but has not been able to do that over last 24h.  ? ?He has not slept well has had hicups for 48 hours.  We reviewed CT chest together.  ? ? ?01/09/22-patient is pursuing options for hospice versus palliative care ? ?PAST MEDICAL HISTORY  ? ?Past Medical History:  ?Diagnosis Date  ? Bleb, lung ([place])   ? COPD (chronic obstructive pulmonary disease) ([place])   ? Coronary artery disease 02/14/2017  ? NSTEMI with urgent CABG (LIMA-LAD and SVG->ramus)  ? Hyperlipidemia   ? Ischemic cardiomyopathy   ? Lung nodule   ? Pneumothorax 05/2017  ? Left  ? Prostate cancer ([facility])   ? ? ? ?SURGICAL HISTORY  ? ?Past Surgical History:  ?Procedure Laterality Date  ? CATARACT EXTRACTION, BILATERAL    ? CORONARY ARTERY BYPASS GRAFT N/A 02/14/2017  ? Procedure: CORONARY ARTERY BYPASS GRAFTING (CABG) x 2 , using left internal mammary artery and right leg greater saphenous vein harvested endoscopically LIMA-LAD, SVG-RAMUS;  Surgeon: [name], MD;  Location: [facility];  Service: Open Heart Surgery;  Laterality: N/A;  ? ENDOVEIN HARVEST OF GREATER SAPHENOUS VEIN Right 02/14/2017  ? Procedure: ENDOVEIN HARVEST OF RIGHT THIGH GREATER SAPHENOUS VEIN;  Surgeon: [name], MD;  Location: [facility];  Service: Open Heart Surgery;  Laterality:  Right;  ? LEFT HEART CATH AND CORONARY ANGIOGRAPHY N/A 02/14/2017  ? Procedure: Left Heart Cath and Coronary Angiography;  Surgeon: [name], MD;  Location: [facility];  Service: Cardiovascular;  Laterality: N/A;  ? NASAL POLYP SURGERY    ? STAPLING OF BLEBS N/A 02/14/2017  ? Procedure: STAPLING OF LARGE LEFT UPPER LOBE PULMONARY BLEB;  Surgeon: [name], MD;  Location: [facility];  Service: Open Heart Surgery;  Laterality: N/A;  ? TEE WITHOUT CARDIOVERSION N/A 02/14/2017  ? Procedure: TRANSESOPHAGEAL ECHOCARDIOGRAM (TEE);  Surgeon: [name], MD;  Location: [facility];  Service: Open Heart Surgery;  Laterality: N/A;  ? VIDEO ASSISTED THORACOSCOPY (VATS) W/TALC PLEUADESIS N/A 11/23/2017  ? Procedure: VIDEO ASSISTED THORACOSCOPY (VATS) W/TALC PLEUADESIS;  Surgeon: Nestor Lewandowsky, MD;  Location: [facility];  Service: Thoracic;  Laterality: N/A;  ? ? ? ?FAMILY HISTORY  ? ?Family History  ?Problem Relation Age of Onset  ? Obesity Son   ? ? ? ?SOCIAL HISTORY  ? ?Social History  ? ?Tobacco Use  ? Smoking status: Former  ?  Types: Cigarettes  ?  Quit date: 04/06/2007  ?  Years since quitting: 14.7  ? Smokeless tobacco: Never  ?Vaping Use  ? Vaping Use: Never used  ?Substance Use Topics  ? Alcohol use: No  ? Drug use: No  ? ? ? ?  MEDICATIONS  ? ? ?Home Medication:  ?  ?Current Medication: ? ?Current Facility-Administered Medications:  ?  0.9 %  sodium chloride infusion, , Intravenous, PRN, Ezekiel Slocumb, DO, Stopped at 01/02/22 2225 ?  albuterol (PROVENTIL) (2.5 MG/3ML) 0.083% nebulizer solution 2.5 mg, 2.5 mg, Nebulization, Q3H PRN, Nicole Kindred A, DO, 2.5 mg at 01/08/22 2356 ?  aspirin EC tablet 81 mg, 81 mg, Oral, Daily, Cox, Amy N, DO, 81 mg at 01/09/22 0910 ?  atorvastatin (LIPITOR) tablet 40 mg, 40 mg, Oral, Daily, Cox, Amy N, DO, 40 mg at 01/09/22 0910 ?  chlorpheniramine-HYDROcodone 10-8 MG/5ML suspension 5 mL, 5 mL, Oral, Q12H PRN, Nicole Kindred A, DO, 5 mL at 01/09/22 0912 ?  enoxaparin  (LOVENOX) injection 40 mg, 40 mg, Subcutaneous, Q24H, Cox, Amy N, DO, 40 mg at 01/08/22 2106 ?  ferrous sulfate tablet 325 mg, 325 mg, Oral, Q breakfast, Cox, Amy N, DO, 325 mg at 01/09/22 0910 ?  guaiFENesin-dextromethorphan (ROBITUSSIN DM) 100-10 MG/5ML syrup 5 mL, 5 mL, Oral, Q6H PRN, Jennye Boroughs, MD, 5 mL at 01/08/22 2106 ?  ipratropium-albuterol (DUONEB) 0.5-2.5 (3) MG/3ML nebulizer solution 3 mL, 3 mL, Nebulization, QID, Jennye Boroughs, MD, 3 mL at 01/09/22 0741 ?  lidocaine (LIDODERM) 5 % 1 patch, 1 patch, Transdermal, Daily PRN, Cox, Amy N, DO ?  LORazepam (ATIVAN) tablet 0.5 mg, 0.5 mg, Oral, Q6H PRN, Jennye Boroughs, MD, 0.5 mg at 01/08/22 2348 ?  montelukast (SINGULAIR) tablet 10 mg, 10 mg, Oral, QHS, Cox, Amy N, DO, 10 mg at 01/08/22 2106 ?  morphine (PF) 2 MG/ML injection 2 mg, 2 mg, Intravenous, Q8H PRN, Jennye Boroughs, MD, 2 mg at 01/09/22 2703 ?  multivitamin with minerals tablet, , Oral, Daily, Cox, Amy N, DO, 1 tablet at 01/09/22 0910 ?  ondansetron (ZOFRAN) tablet 4 mg, 4 mg, Oral, Q6H PRN **OR** ondansetron (ZOFRAN) injection 4 mg, 4 mg, Intravenous, Q6H PRN, Cox, Amy N, DO ?  sodium chloride (OCEAN) 0.65 % nasal spray 1 spray, 1 spray, Each Nare, PRN, Jennye Boroughs, MD, 1 spray at 01/08/22 1646 ?  tamsulosin (FLOMAX) capsule 0.4 mg, 0.4 mg, Oral, Daily, Cox, Amy N, DO, 0.4 mg at 01/09/22 0910 ? ? ? ?ALLERGIES  ? ?No known allergies ? ? ? ? ?REVIEW OF SYSTEMS  ? ? ?Review of Systems: ? ?Gen:  Denies  fever, sweats, chills weigh loss  ?HEENT: Denies blurred vision, double vision, ear pain, eye pain, hearing loss, nose bleeds, sore throat ?Cardiac:  No dizziness, chest pain or heaviness, chest tightness,edema ?Resp:   Denies cough or sputum porduction, shortness of breath,wheezing, hemoptysis,  ?Gi: Denies swallowing difficulty, stomach pain, nausea or vomiting, diarrhea, constipation, bowel incontinence ?Gu:  Denies bladder incontinence, burning urine ?Ext:   Denies Joint pain, stiffness or  swelling ?Skin: Denies  skin rash, easy bruising or bleeding or hives ?Endoc:  Denies polyuria, polydipsia , polyphagia or weight change ?Psych:   Denies depression, insomnia or hallucinations  ? ?Other:  All other systems negative ? ? ?VS: BP (!) 139/54   Pulse (!) 103   Temp 99.1 ?F (37.3 ?C)   Resp (!) 22   Ht '5\' 4"'$  (1.626 m)   Wt 68 kg   SpO2 92%   BMI 25.73 kg/m?   ? ? ? ?PHYSICAL EXAM  ? ? ?GENERAL:NAD, no fevers, chills, no weakness no fatigue ?HEAD: Normocephalic, atraumatic.  ?EYES: Pupils equal, round, reactive to light. Extraocular muscles intact. No scleral icterus.  ?MOUTH: Moist mucosal  membrane. Dentition intact. No abscess noted.  ?EAR, NOSE, THROAT: Clear without exudates. No external lesions.  ?NECK: Supple. No thyromegaly. No nodules. No JVD.  ?PULMONARY:rhonchi bilaterally worse at RUL zone ?CARDIOVASCULAR: S1 and S2. Regular rate and rhythm. No murmurs, rubs, or gallops. No edema. Pedal pulses 2+ bilaterally.  ?GASTROINTESTINAL: Soft, nontender, nondistended. No masses. Positive bowel sounds. No hepatosplenomegaly.  ?MUSCULOSKELETAL: No swelling, clubbing, or edema. Range of motion full in all extremities.  ?NEUROLOGIC: Cranial nerves II through XII are intact. No gross focal neurological deficits. Sensation intact. Reflexes intact.  ?SKIN: No ulceration, lesions, rashes, or cyanosis. Skin warm and dry. Turgor intact.  ?PSYCHIATRIC: Mood, affect within normal limits. The patient is awake, alert and oriented x 3. Insight, judgment intact.  ? ? ?  ? ?IMAGING  ? ? ?CT Angio Chest Pulmonary Embolism (PE) W or WO Contrast ? ?Result Date: 01/04/2022 ?CLINICAL DATA:  Prostate cancer. Concern for pneumonia chest radiograph EXAM: CT ANGIOGRAPHY CHEST WITH CONTRAST TECHNIQUE: Multidetector CT imaging of the chest was performed using the standard protocol during bolus administration of intravenous contrast. Multiplanar CT image reconstructions and MIPs were obtained to evaluate the vascular anatomy.  RADIATION DOSE REDUCTION: This exam was performed according to the departmental dose-optimization program which includes automated exposure control, adjustment of the mA and/or kV according to patient size and

## 2022-01-09 NOTE — Progress Notes (Signed)
Called patients son [name] to make aware, his fathers status is declining. Patient is unable to keep oxygen up and requiring more oxygen. Respiratory placed patient on HFNC @ 12L.  Sat still ranging 86-90%. Family members are on their way ?

## 2022-01-09 NOTE — Progress Notes (Signed)
? ? ? ?Progress Note  ? ? ?[name]  MGQ:[id number] DOB: 1945-12-30  DOA: 01/07/2022 ?PCP: [name], MD  ? ? ? ? ?Brief Narrative:  ? ? ?Medical records reviewed and are as summarized below: ? ? ?Thomas Trujillo is a 76 year old male with history of BPH, hyperlipidemia, advanced COPD on 2 L/min home oxygen with activity only, history of CAD, prostate cancer soon to start on radiation therapy, history of NSTEMI, status post CABG x2, history of pneumothorax on the left, who presented to the ED on 12/27/2021 for evaluation of fevers and shortness of breath. ? ?Evaluation in the ED revealed severe sepsis secondary to community-acquired pneumonia complicated by acute respiratory failure with hypoxia requiring as much as 6 to 7 L/min oxygen. ? ?He was treated with empiric IV Rocephin and azithromycin. ? ? ? ? ? ? ? ?Assessment/Plan:  ? ?Principal Problem: ?  Severe sepsis ([place]) ?Active Problems: ?  Community acquired pneumonia ?  Acute on chronic respiratory failure with hypoxia (HCC) ?  COPD with acute exacerbation ([place]) ?  Chronic respiratory failure with hypoxia (HCC) ?  Chest pain, non-cardiac ?  Coronary artery disease involving native coronary artery of native heart without angina pectoris ?  Elevated troponin ?  Hyperlipidemia LDL goal <70 ?  Prostate cancer ([place]) ?  Positive blood culture ? ? ? ?Body mass index is 25.73 kg/m?. ? ? ?Severe sepsis currently community-acquired pneumonia ?Acute on chronic hypoxic respiratory failure ?Severe COPD with exacerbation: ?Leukocytosis ?Elevated troponin from demand ischemia ?Prostate cancer ?CAD ?Hyperlipidemia ? ? ?PLAN ? ? ?Patient's condition continues to deteriorate.  He is requiring 15 L/min oxygen via nasal cannula. ?Patient and his family requested comfort measures with hospice. ?He has been placed on IV morphine infusion for pain and IV Ativan as needed for anxiety. ?Patient and his family understand going to [place] is not an option at this  time. ?Prognosis is poor. ?Plan of care was discussed with [name], son. ? ? ? ? ?Diet Order   ? ?       ?  Diet Heart Room service appropriate? Yes; Fluid consistency: Thin  Diet effective now       ?  ? ?  ?  ? ?  ? ? ? ? ? ? ?Consultants: ?Pulmonologist ?Palliative care ? ?Procedures: ?None ? ? ? ?Medications:  ? ? ipratropium-albuterol  3 mL Nebulization QID  ? ?Continuous Infusions: ? sodium chloride Stopped (01/02/22 2225)  ? morphine 2 mg/hr (01/09/22 1533)  ? ? ? ?Anti-infectives (From admission, onward)  ? ? Start     Dose/Rate Route Frequency Ordered Stop  ? 01/05/22 1015  azithromycin (ZITHROMAX) tablet 500 mg       ? 500 mg Oral Daily 01/05/22 0923 01/06/22 0851  ? 01/05/22 1000  cefTRIAXone (ROCEPHIN) 1 g in sodium chloride 0.9 % 100 mL IVPB       ? 1 g ?200 mL/hr over 30 Minutes Intravenous Every 24 hours 01/05/22 0923 01/06/22 2040  ? 12/31/21 2200  azithromycin (ZITHROMAX) 500 mg in sodium chloride 0.9 % 250 mL IVPB  Status:  Discontinued       ? 500 mg ?250 mL/hr over 60 Minutes Intravenous Every 24 hours 01/01/2022 2035 12/31/21 1432  ? 12/31/21 2200  cefTRIAXone (ROCEPHIN) 2 g in sodium chloride 0.9 % 100 mL IVPB       ? 2 g ?200 mL/hr over 30 Minutes Intravenous Every 24 hours 12/22/2021 2035 01/03/22 2051  ?  12/31/21 2200  azithromycin (ZITHROMAX) tablet 500 mg       ? 500 mg Oral Daily at bedtime 12/31/21 1432 01/03/22 2007  ? 12/29/2021 2045  cefTRIAXone (ROCEPHIN) 1 g in sodium chloride 0.9 % 100 mL IVPB  Status:  Discontinued       ?Note to Pharmacy: Please total to 2 g IVPB today, thank you.  ? 1 g ?200 mL/hr over 30 Minutes Intravenous  Once 01/03/2022 2034 12/27/2021 2039  ? 01/14/2022 2015  azithromycin (ZITHROMAX) 500 mg in sodium chloride 0.9 % 250 mL IVPB       ? 500 mg ?250 mL/hr over 60 Minutes Intravenous  Once 01/01/2022 2009 01/15/2022 2231  ? 01/06/2022 2015  cefTRIAXone (ROCEPHIN) 1 g in sodium chloride 0.9 % 100 mL IVPB       ? 1 g ?200 mL/hr over 30 Minutes Intravenous  Once 12/28/2021 2009  01/09/2022 2052  ? ?  ? ? ? ? ? ? ? ? ? ?Family Communication/Anticipated D/C date and plan/Code Status  ? ?DVT prophylaxis:  ? ? ?  Code Status: DNR ? ?Family Communication: [name], son ?Disposition Plan: He will likely expire in the hospital ? ? ?Status is: Inpatient ?Remains inpatient appropriate because: Comfort care ? ? ? ? ? ? ?Subjective:  ? ?Interval events noted.  He complains of shortness of breath, cough productive of bloody sputum. ? ?Objective:  ? ? ?Vitals:  ? 01/09/22 1128 01/09/22 1130 01/09/22 1157 01/09/22 1201  ?BP:    (!) 147/56  ?Pulse:    (!) 102  ?Resp:    (!) 25  ?Temp:    98 ?F (36.7 ?C)  ?TempSrc:    Axillary  ?SpO2: (!) 85% 90% (!) 82% 90%  ?Weight:      ?Height:      ? ?No data found. ? ? ?Intake/Output Summary (Last 24 hours) at 01/09/2022 1542 ?Last data filed at 01/09/2022 1533 ?Gross per 24 hour  ?Intake 243.73 ml  ?Output 100 ml  ?Net 143.73 ml  ? ?Filed Weights  ? 12/26/2021 1912  ?Weight: 68 kg  ? ? ?Exam: ? ?GEN: Mild respiratory distress, ill-looking ?SKIN: Warm and dry ?EYES: No pallor or icterus ?ENT: MMM ?CV: RRR ?PULM: Decreased air entry bilaterally ?ABD: soft, ND, NT, +BS ?CNS: AAO x 3, non focal ?EXT: No edema or tenderness ? ? ? ? ?  ? ? ?Data Reviewed:  ? ?I have personally reviewed following labs and imaging studies: ? ?Labs: ?Labs show the following:  ? ?Basic Metabolic Panel: ?Recent Labs  ?Lab 01/06/22 ?0601  ?NA 139  ?K 4.3  ?CL 101  ?CO2 30  ?GLUCOSE 128*  ?BUN 32*  ?CREATININE 1.03  ?CALCIUM 7.9*  ? ?GFR ?Estimated Creatinine Clearance: 51.9 mL/min (by C-G formula based on SCr of 1.03 mg/dL). ?Liver Function Tests: ?No results for input(s): AST, ALT, ALKPHOS, BILITOT, PROT, ALBUMIN in the last 168 hours. ?No results for input(s): LIPASE, AMYLASE in the last 168 hours. ?No results for input(s): AMMONIA in the last 168 hours. ?Coagulation profile ?No results for input(s): INR, PROTIME in the last 168 hours. ? ?CBC: ?Recent Labs  ?Lab 01/03/22 ?0259 01/04/22 ?0407  01/05/22 ?0441 01/06/22 ?0601  ?WBC 13.1* 16.6* 21.9* 25.8*  ?NEUTROABS  --   --   --  23.5*  ?HGB 12.1* 12.0* 12.3* 12.2*  ?HCT 37.3* 36.9* 38.1* 37.6*  ?MCV 94.7 94.1 94.5 94.5  ?PLT 260 277 255 236  ? ?Cardiac Enzymes: ?No results for input(s): CKTOTAL,  CKMB, CKMBINDEX, TROPONINI in the last 168 hours. ?BNP (last 3 results) ?No results for input(s): PROBNP in the last 8760 hours. ?CBG: ?No results for input(s): GLUCAP in the last 168 hours. ?D-Dimer: ?No results for input(s): DDIMER in the last 72 hours. ? ?Hgb A1c: ?No results for input(s): HGBA1C in the last 72 hours. ?Lipid Profile: ?No results for input(s): CHOL, HDL, LDLCALC, TRIG, CHOLHDL, LDLDIRECT in the last 72 hours. ?Thyroid function studies: ?No results for input(s): TSH, T4TOTAL, T3FREE, THYROIDAB in the last 72 hours. ? ?Invalid input(s): FREET3 ?Anemia work up: ?No results for input(s): VITAMINB12, FOLATE, FERRITIN, TIBC, IRON, RETICCTPCT in the last 72 hours. ?Sepsis Labs: ?Recent Labs  ?Lab 01/03/22 ?0259 01/04/22 ?0407 01/04/22 ?1640 01/05/22 ?0441 01/06/22 ?0601  ?PROCALCITON 0.19  --  0.14  --   --   ?WBC 13.1* 16.6*  --  21.9* 25.8*  ? ? ?Microbiology ?Recent Results (from the past 240 hour(s))  ?Resp Panel by RT-PCR (Flu A&B, Covid) Nasopharyngeal Swab     Status: None  ? Collection Time: 12/29/2021  7:16 PM  ? Specimen: Nasopharyngeal Swab; Nasopharyngeal(NP) swabs in vial transport medium  ?Result Value Ref Range Status  ? SARS Coronavirus 2 by RT PCR NEGATIVE NEGATIVE Final  ?  Comment: (NOTE) ?SARS-CoV-2 target nucleic acids are NOT DETECTED. ? ?The SARS-CoV-2 RNA is generally detectable in upper respiratory ?specimens during the acute phase of infection. The lowest ?concentration of SARS-CoV-2 viral copies this assay can detect is ?138 copies/mL. A negative result does not preclude SARS-Cov-2 ?infection and should not be used as the sole basis for treatment or ?other patient management decisions. A negative result may occur with   ?improper specimen collection/handling, submission of specimen other ?than nasopharyngeal swab, presence of viral mutation(s) within the ?areas targeted by this assay, and inadequate number of viral ?copies(<138 copies/mL). A negative

## 2022-01-10 ENCOUNTER — Ambulatory Visit: Payer: Medicare Other

## 2022-01-10 DIAGNOSIS — Z515 Encounter for palliative care: Secondary | ICD-10-CM | POA: Diagnosis not present

## 2022-01-10 DIAGNOSIS — A419 Sepsis, unspecified organism: Secondary | ICD-10-CM | POA: Diagnosis not present

## 2022-01-10 DIAGNOSIS — R6520 Severe sepsis without septic shock: Secondary | ICD-10-CM | POA: Diagnosis not present

## 2022-01-10 DIAGNOSIS — J9621 Acute and chronic respiratory failure with hypoxia: Secondary | ICD-10-CM | POA: Diagnosis not present

## 2022-01-10 MED ORDER — GLYCOPYRROLATE 0.2 MG/ML IJ SOLN
0.2000 mg | INTRAMUSCULAR | Status: DC
  Administered 2022-01-10 (×2): 0.2 mg via INTRAVENOUS
  Filled 2022-01-10 (×3): qty 1

## 2022-01-10 MED ORDER — MORPHINE BOLUS VIA INFUSION
2.0000 mg | INTRAVENOUS | Status: DC | PRN
  Administered 2022-01-10: 2 mg via INTRAVENOUS
  Filled 2022-01-10: qty 2

## 2022-01-10 MED ORDER — IPRATROPIUM-ALBUTEROL 0.5-2.5 (3) MG/3ML IN SOLN
3.0000 mL | RESPIRATORY_TRACT | Status: DC | PRN
Start: 2022-01-10 — End: 2022-01-10

## 2022-01-11 ENCOUNTER — Ambulatory Visit: Payer: Medicare Other

## 2022-01-12 ENCOUNTER — Ambulatory Visit: Payer: Medicare Other

## 2022-01-12 ENCOUNTER — Inpatient Hospital Stay: Payer: Medicare Other

## 2022-01-13 ENCOUNTER — Ambulatory Visit: Payer: Medicare Other

## 2022-01-14 ENCOUNTER — Ambulatory Visit: Payer: Medicare Other

## 2022-01-17 ENCOUNTER — Ambulatory Visit: Payer: Medicare Other

## 2022-01-18 ENCOUNTER — Ambulatory Visit: Payer: Medicare Other

## 2022-01-19 ENCOUNTER — Ambulatory Visit: Payer: Medicare Other

## 2022-01-20 ENCOUNTER — Ambulatory Visit: Payer: Medicare Other

## 2022-01-21 ENCOUNTER — Ambulatory Visit: Payer: Medicare Other

## 2022-01-22 NOTE — Progress Notes (Signed)
?   29-Jan-2022 1600  ?Clinical Encounter Type  ?Visited With Patient and family together  ?Visit Type Follow-up;Patient actively dying  ? ?Chaplain followed up with family of patient who is EOL. Family needed alone time. ?

## 2022-01-22 NOTE — TOC Progression Note (Signed)
Transition of Care (TOC) - Progression Note  ? ? ?Patient Details  ?Name: Thomas Trujillo ?MRN: [id number] ?Date of Birth: [birth year] ? ?Transition of Care (TOC) CM/SW Contact  ?Kerin Salen, RN ?Phone Number: ?01/29/2022, 2:32 PM ? ?Clinical Narrative:  Called [facility] it will cost about $7,000 to transport in 3-4 days. Son notified and feels that patient condition declined, now on comfort care and may not need transport.  ? ? ? ?Expected Discharge Plan: [place] ?Barriers to Discharge: Continued Medical Work up ? ?Expected Discharge Plan and Services ?Expected Discharge Plan: [place] ?  ?Discharge Planning Services: CM Consult ?Post Acute Care Choice: Home Health ?Living arrangements for the past 2 months: [place] ?                ?  ?  ?  ?  ?  ?HH Arranged: Therapist, sports, PT ?[place] Agency: [place] ?Date HH Agency Contacted: 01/04/22 ?  ?Representative spoke with at [place]: Malachy Mood ? ? ?Social Determinants of Health (SDOH) Interventions ?  ? ?Readmission Risk Interventions ?No flowsheet data found. ? ?

## 2022-01-22 NOTE — Death Summary Note (Addendum)
? ?DEATH SUMMARY  ? ?Patient Details  ?Name: Thomas Trujillo ?MRN: [id number] ?DOB: 1945/10/30 ?DJT:[id number], Thomas Noa, MD ?Admission/Discharge Information  ? ?Admit Date:  01-08-22  ?Date of Death: Date of Death: Jan 19, 2022  ?Time of Death: Time of Death: 1610/01/14  ?Length of Stay: 10  ? ?Principle Cause of death: Severe sepsis ? ?Hospital Diagnoses: ?Principal Problem: ?  Severe sepsis ([place]) ?Active Problems: ?  Community acquired pneumonia ?  Acute on chronic respiratory failure with hypoxia (HCC) ?  COPD with acute exacerbation ([place]) ?  Chronic respiratory failure with hypoxia (HCC) ?  Chest pain, non-cardiac ?  Coronary artery disease involving native coronary artery of native heart without angina pectoris ?  Elevated troponin ?  Hyperlipidemia LDL goal <70 ?  Prostate cancer ([place]) ?  Positive blood culture ? ? ?Hospital Course: ? ?Thomas Trujillo was a 76 year old male with history of BPH, hyperlipidemia, advanced COPD on 2 L/min home oxygen with activity only, history of CAD, prostate cancer soon to start on radiation therapy, history of NSTEMI, status post CABG x2, history of pneumothorax on the left, who presented to the ED on 01/08/2022 for evaluation of fevers and shortness of breath. ?  ?Evaluation in the ED revealed severe sepsis secondary to community-acquired pneumonia complicated by acute respiratory failure with hypoxia requiring as much as 6 to 7 L/min oxygen. ?  ?He was treated with empiric steroids, bronchodilators and empiric IV Rocephin and azithromycin.  Unfortunately, his condition deteriorated.  He was requiring up to 15 L/min oxygen via high flow nasal cannula.  Goals of care were discussed with the patient and his family.  He and his family opted for comfort measures with hospice and he was transitioned to comfort care.  He expired on 01-19-2022 at 4:11 PM.  His family was updated at the bedside. ?  ?  ?   ? ? ?Procedures: None ? ?Consultations: Pulmonologist ? ?The results of significant  diagnostics from this hospitalization (including imaging, microbiology, ancillary and laboratory) are listed below for reference.  ? ?Significant Diagnostic Studies: ?CT Angio Chest Pulmonary Embolism (PE) W or WO Contrast ? ?Result Date: 01/04/2022 ?CLINICAL DATA:  Prostate cancer. Concern for pneumonia chest radiograph EXAM: CT ANGIOGRAPHY CHEST WITH CONTRAST TECHNIQUE: Multidetector CT imaging of the chest was performed using the standard protocol during bolus administration of intravenous contrast. Multiplanar CT image reconstructions and MIPs were obtained to evaluate the vascular anatomy. RADIATION DOSE REDUCTION: This exam was performed according to the departmental dose-optimization program which includes automated exposure control, adjustment of the mA and/or kV according to patient size and/or use of iterative reconstruction technique. CONTRAST:  72m OMNIPAQUE IOHEXOL 350 MG/ML SOLN COMPARISON:  Chest radiograph same day FINDINGS: Cardiovascular: No filling defects within the pulmonary arteries to suggest acute pulmonary embolism. Post CABG Mediastinum/Nodes: No axillary or supraclavicular adenopathy. No mediastinal or hilar adenopathy. No pericardial fluid. Esophagus normal. Lungs/Pleura: There is dense consolidation in the posterior aspect of the RIGHT upper lobe with central air bronchograms and and air-fluid level in the superior RIGHT upper lobe (image 28/6). There is severe underlying centrilobular emphysema and bullous change in the apices. No obstructing mass is identified. Small RIGHT effusion at the base. Upper Abdomen: Limited view of the liver, kidneys, pancreas are unremarkable. Normal adrenal glands. Musculoskeletal: No aggressive osseous lesion. Review of the MIP images confirms the above findings. IMPRESSION: 1. Severe lobar pneumonia in the posterosuperior aspect of the RIGHT upper lobe. Air-fluid level in the upper lobe likely represents  a parapneumonic effusion. Recommend follow-up  imaging to exclude underlying malignancy. 2. No evidence acute pulmonary embolism. 3. Severe centrilobular emphysema and upper lobe bullous change. Electronically Signed   By: Suzy Bouchard M.D.   On: 01/04/2022 19:03  ? ?DG Chest Port 1 View ? ?Result Date: 01/04/2022 ?CLINICAL DATA:  Community-acquired pneumonia, shortness of breath EXAM: PORTABLE CHEST 1 VIEW COMPARISON:  01/02/2022 FINDINGS: The heart size is normal. Status post median sternotomy and CABG. Redemonstrated dense consolidation of the inferior right upper lobe, not significantly changed. Underlying emphysema. The visualized skeletal structures are unremarkable. IMPRESSION: 1. Redemonstrated dense consolidation of the inferior right upper lobe, not significantly changed. Although most likely infectious, underlying malignancy is a significant differential concern. Recommend at minimum follow-up radiographs in 6-8 weeks to ensure complete resolution. 2.  Emphysema. Electronically Signed   By: Delanna Ahmadi M.D.   On: 01/04/2022 13:15  ? ?DG Chest Port 1 View ? ?Result Date: 01/02/2022 ?CLINICAL DATA:  Shortness of breath.  Cough. EXAM: PORTABLE CHEST 1 VIEW COMPARISON:  Radiograph 12/01/2017. FINDINGS: Advanced emphysema. There is dense consolidation in the perihilar right upper lobe abutting the fissure. Small amount of adjacent fissural fluid. Left basilar scarring with seen on prior exam. Post median sternotomy and CABG. The heart is normal in size. No pulmonary edema. No pneumothorax. IMPRESSION: 1. Dense perihilar right upper lobe consolidation, suspicious for pneumonia. Small amount of adjacent fissural fluid. Recommend close radiographic follow-up to resolution. 2. Advanced emphysema. 3. Left basilar scarring. Electronically Signed   By: Keith Rake M.D.   On: 01/08/2022 19:57  ? ?ECHOCARDIOGRAM COMPLETE ? ?Result Date: 01/03/2022 ?   ECHOCARDIOGRAM REPORT   Patient Name:   Thomas Trujillo Date of Exam: 01/02/2022 Medical Rec #:  [id number]      Height:       64.0 in Accession #:    [id number]    Weight:       149.9 lb Date of Birth:  05/12/1946    BSA:          1.731 m? Patient Age:    39 years      BP:           104/57 mmHg Patient Gender: M             HR:           81 bpm. Exam Location:  [facility] Procedure: 2D Echo Indications:     Bacteremia R78.81  History:         Patient has prior history of Echocardiogram examinations, most                  recent 04/21/2017.  Sonographer:     Kathlen Brunswick RDCS Referring Phys:  [id number] Claiborne Billings A GRIFFITH Diagnosing Phys: Kathlyn Sacramento MD  Sonographer Comments: Technically difficult study due to poor echo windows. Image acquisition challenging due to respiratory motion. Best images were obtained from the subcostal window. IMPRESSIONS  1. Left ventricular ejection fraction, by estimation, is 55 to 60%. The left ventricle has normal function. Left ventricular endocardial border not optimally defined to evaluate regional wall motion. Left ventricular diastolic parameters were normal.  2. Right ventricular systolic function is normal. The right ventricular size is normal. Tricuspid regurgitation signal is inadequate for assessing PA pressure.  3. The mitral valve is normal in structure. No evidence of mitral valve regurgitation. No evidence of mitral stenosis.  4. The aortic valve is normal in structure. Aortic valve regurgitation is not visualized. No  aortic stenosis is present.  5. The inferior vena cava is normal in size with greater than 50% respiratory variability, suggesting right atrial pressure of 3 mmHg. Conclusion(s)/Recommendation(s): No evidence of valvular vegetations on this transthoracic echocardiogram. However, the study is overall suboptimal. FINDINGS  Left Ventricle: Left ventricular ejection fraction, by estimation, is 55 to 60%. The left ventricle has normal function. Left ventricular endocardial border not optimally defined to evaluate regional wall motion. The left ventricular internal cavity size  was normal in size. There is no left ventricular hypertrophy. Left ventricular diastolic parameters were normal. Right Ventricle: The right ventricular size is normal. No increase in right ventricular wall thickness

## 2022-01-22 NOTE — Progress Notes (Signed)
Pt transferred to room 104. Report given to [facility], 1C nurse. Family at bedside, all belongings with pt. ?

## 2022-01-22 NOTE — Care Management Important Message (Signed)
Important Message ? ?Patient Details  ?Name: Thomas Trujillo ?MRN: [id number] ?Date of Birth: 1946-10-01 ? ? ?Medicare Important Message Given:  Other (see comment) ? ?On comfort measures.  Medicare IM withheld at this time out of respect for patient and family.  ? ? ?Dannette Barbara ?02/03/22, 8:49 AM ?

## 2022-01-22 NOTE — Progress Notes (Signed)
?   2022-02-04 1200  ?Clinical Encounter Type  ?Visited With Patient and family together  ?Visit Type Initial;Patient actively dying;Spiritual support  ?Referral From Chaplain  ?Spiritual Encounters  ?Spiritual Needs Grief support  ? ?Chaplain followed up on family as they walk through EOL with loved one. Chaplain provided support through compassionate presence, meaningful conversation, and time of sharing memories.  ?

## 2022-01-22 NOTE — Progress Notes (Incomplete)
{  Select_TRH_[id number]} 

## 2022-01-22 NOTE — Progress Notes (Signed)
? ?                                                                                                                                                     ?                                                   ?Daily Progress Note  ? ?Patient Name: Thomas Trujillo       Date: 2022/01/23 ?DOB: 1946/10/17  Age: 76 y.o. MRN#: [id number] ?Attending Physician: Jennye Boroughs, MD ?Primary Care Physician: [name], MD ?Admit Date: 01/18/2022 ? ?Reason for Consultation/Follow-up: Establishing goals of care ? ?Patient Profile/HPI: 76 y.o. male  with past medical history of prostate cancer (radiation to start recently), BPH, HLD, advanced COPD (2 L [place] baseline at home, 4 L with exertion), CAD, NSTEMI with CABG x2, and history of left pneumothorax admitted on 01/14/2022 with fever and shortness of breath.  Patient is being treated for community-acquired pneumonia.  On night of 3/15, patient was transferred to progressive care due to increasing shortness of breath and use of BiPAP.  ? ?Subjective: ?Chart reviewed including labs, progress notes, vitals. Noted patient worsened over weekend and has been transitioned to comfort measures only.  ?On my evaluation he has audible terminal secretions, he is not responsive to my voice or touch.  ?RR is labored.  ?No family at bedside.  ? ?Review of Systems  ?Unable to perform ROS: Acuity of condition  ? ? ?Physical Exam ?Vitals and nursing note reviewed.  ?Pulmonary:  ?   Comments: Labored. Terminal secretions ?Neurological:  ?   Comments: unresponsive  ?         ? ?Vital Signs: BP (!) 76/44 (BP Location: Right Arm)   Pulse (!) 116   Temp 98.5 ?F (36.9 ?C)   Resp 16   Ht '5\' 4"'$  (1.626 m)   Wt 68 kg   SpO2 93%   BMI 25.73 kg/m?  ?SpO2: SpO2: 93 % ?O2 Device: O2 Device: High Flow Nasal Cannula ?O2 Flow Rate: O2 Flow Rate (L/min): 15 L/min ? ?Intake/output summary:  ?Intake/Output Summary (Last 24 hours) at 2022-01-23 0954 ?Last data filed at 2022/01/23 0700 ?Gross per 24 hour  ?Intake  274.63 ml  ?Output 100 ml  ?Net 174.63 ml  ? ?LBM: Last BM Date : 01/07/22 ?Baseline Weight: Weight: 68 kg ?Most recent weight: Weight: 68 kg ? ?     ?Palliative Assessment/Data: PPS: 10% ? ? ? ? ? ?Patient Active Problem List  ? Diagnosis Date Noted  ? Positive blood culture 01/02/2022  ? Acute on chronic respiratory failure with hypoxia ([place]) 12/31/2021  ? Prostate cancer ([place]) 12/31/2021  ?  Chest pain, non-cardiac 12/31/2021  ? Elevated troponin 12/31/2021  ? Severe sepsis ([place]) 12/31/2021  ? Community acquired pneumonia 01/06/2022  ? Chronic respiratory failure with hypoxia ([place]) 03/08/2019  ? Pneumothorax on left 11/18/2017  ? Dyspnea on exertion 09/14/2017  ? COPD with acute exacerbation ([place]) 06/07/2017  ? Pneumothorax 05/30/2017  ? Ischemic cardiomyopathy 04/05/2017  ? Hyperlipidemia LDL goal <70 04/05/2017  ? Coronary artery disease involving native coronary artery of native heart without angina pectoris 02/24/2017  ? S/P CABG x 2 02/14/2017  ? NSTEMI (non-ST elevated myocardial infarction) ([place]) 02/13/2017  ? ? ?Palliative Care Assessment & Plan  ? ? ?Assessment/Recommendations/Plan ? ?Actively dying- full comfort measures only ?Continue morphine infusion- I have added bolus '2mg'$  q15 min prn for air hunger, rr>24 ?Terminal secretions- robinul 0.2 mg q4hr IV ?Not stable for transfer out of facility- anticipate hospital death ? ? ?Code Status: ?DNR ? ?Prognosis: ? Hours - Days ? ?Discharge Planning: ?Anticipated Hospital Death ? ? ?Thank you for allowing the Palliative Medicine Team to assist in the care of this patient. ? ? ?Mariana Kaufman, AGNP-C ?Palliative Medicine ? ? ?Please contact Palliative Medicine Team phone at [phone] for questions and concerns.  ? ? ? ? ? ? ?

## 2022-01-22 DEATH — deceased

## 2022-01-24 ENCOUNTER — Ambulatory Visit: Payer: Medicare Other

## 2022-01-25 ENCOUNTER — Ambulatory Visit: Payer: Medicare Other

## 2022-01-26 ENCOUNTER — Ambulatory Visit: Payer: Medicare Other

## 2022-01-26 ENCOUNTER — Inpatient Hospital Stay: Payer: Medicare Other

## 2022-01-27 ENCOUNTER — Ambulatory Visit: Payer: Medicare Other

## 2022-01-28 ENCOUNTER — Ambulatory Visit: Payer: Medicare Other

## 2022-01-31 ENCOUNTER — Ambulatory Visit: Payer: Medicare Other

## 2022-02-01 ENCOUNTER — Ambulatory Visit: Payer: Medicare Other

## 2022-02-02 ENCOUNTER — Ambulatory Visit: Payer: Medicare Other

## 2022-02-03 ENCOUNTER — Ambulatory Visit: Payer: Medicare Other

## 2022-02-04 ENCOUNTER — Ambulatory Visit: Payer: Medicare Other

## 2022-02-07 ENCOUNTER — Ambulatory Visit: Payer: Medicare Other

## 2022-02-08 ENCOUNTER — Ambulatory Visit: Payer: Medicare Other

## 2022-02-09 ENCOUNTER — Inpatient Hospital Stay: Payer: Medicare Other

## 2022-02-09 ENCOUNTER — Ambulatory Visit: Payer: Medicare Other

## 2022-02-10 ENCOUNTER — Ambulatory Visit: Payer: Medicare Other

## 2022-02-11 ENCOUNTER — Ambulatory Visit: Payer: Medicare Other

## 2022-02-14 ENCOUNTER — Ambulatory Visit: Payer: Medicare Other

## 2022-02-15 ENCOUNTER — Ambulatory Visit: Payer: Medicare Other

## 2022-02-16 ENCOUNTER — Ambulatory Visit: Payer: Medicare Other

## 2022-02-17 ENCOUNTER — Ambulatory Visit: Payer: Medicare Other

## 2022-02-18 ENCOUNTER — Ambulatory Visit: Payer: Medicare Other

## 2022-02-21 ENCOUNTER — Ambulatory Visit: Payer: Medicare Other

## 2022-02-22 ENCOUNTER — Ambulatory Visit: Payer: Medicare Other

## 2022-02-23 ENCOUNTER — Ambulatory Visit: Payer: Medicare Other

## 2022-02-24 ENCOUNTER — Ambulatory Visit: Payer: Medicare Other

## 2022-02-25 ENCOUNTER — Ambulatory Visit: Payer: Medicare Other

## 2022-02-28 ENCOUNTER — Ambulatory Visit: Payer: Medicare Other

## 2022-03-01 ENCOUNTER — Ambulatory Visit: Payer: Medicare Other

## 2022-03-02 ENCOUNTER — Ambulatory Visit: Payer: Medicare Other

## 2022-03-03 ENCOUNTER — Ambulatory Visit: Payer: Medicare Other

## 2022-03-04 ENCOUNTER — Ambulatory Visit: Payer: Medicare Other

## 2022-03-07 ENCOUNTER — Ambulatory Visit: Payer: Medicare Other

## 2022-06-14 ENCOUNTER — Ambulatory Visit: Payer: Medicare Other | Admitting: Urology
